# Patient Record
Sex: Male | Born: 1945 | Race: White | Hispanic: No | Smoking: Former smoker
Health system: Southern US, Community
[De-identification: ages and names within clinical notes are randomized; demographics above are authoritative.]

## PROBLEM LIST (undated history)

## (undated) DIAGNOSIS — K219 Gastro-esophageal reflux disease without esophagitis: Secondary | ICD-10-CM

## (undated) DIAGNOSIS — R7989 Other specified abnormal findings of blood chemistry: Secondary | ICD-10-CM

## (undated) DIAGNOSIS — R945 Abnormal results of liver function studies: Secondary | ICD-10-CM

## (undated) DIAGNOSIS — I255 Ischemic cardiomyopathy: Secondary | ICD-10-CM

## (undated) DIAGNOSIS — E7800 Pure hypercholesterolemia, unspecified: Secondary | ICD-10-CM

## (undated) DIAGNOSIS — M1990 Unspecified osteoarthritis, unspecified site: Secondary | ICD-10-CM

## (undated) DIAGNOSIS — I10 Essential (primary) hypertension: Secondary | ICD-10-CM

## (undated) DIAGNOSIS — I2510 Atherosclerotic heart disease of native coronary artery without angina pectoris: Secondary | ICD-10-CM

## (undated) DIAGNOSIS — I253 Aneurysm of heart: Secondary | ICD-10-CM

## (undated) DIAGNOSIS — I219 Acute myocardial infarction, unspecified: Secondary | ICD-10-CM

## (undated) DIAGNOSIS — F419 Anxiety disorder, unspecified: Secondary | ICD-10-CM

## (undated) HISTORY — PX: CORONARY ANGIOPLASTY WITH STENT PLACEMENT: SHX49

## (undated) HISTORY — DX: Aneurysm of heart: I25.3

---

## 1968-10-25 HISTORY — PX: COSMETIC SURGERY: SHX468

## 2001-02-24 DIAGNOSIS — I219 Acute myocardial infarction, unspecified: Secondary | ICD-10-CM

## 2001-02-24 HISTORY — DX: Acute myocardial infarction, unspecified: I21.9

## 2007-06-02 ENCOUNTER — Inpatient Hospital Stay (HOSPITAL_COMMUNITY): Admission: EM | Admit: 2007-06-02 | Discharge: 2007-06-09 | Payer: Self-pay | Admitting: Emergency Medicine

## 2007-12-18 ENCOUNTER — Inpatient Hospital Stay (HOSPITAL_COMMUNITY): Admission: EM | Admit: 2007-12-18 | Discharge: 2007-12-21 | Payer: Self-pay | Admitting: Infectious Diseases

## 2010-07-09 NOTE — Cardiovascular Report (Signed)
[name], [name]:  [id number]   MEDICAL RECORD NO.:  [id number]          PATIENT TYPE:  INP   LOCATION:  2927                         FACILITY:  [facility]   PHYSICIAN:  Madaline Savage, M.D.DATE OF BIRTH:  [birth year]   DATE OF PROCEDURE:  06/07/2007  DATE OF DISCHARGE:                            CARDIAC CATHETERIZATION   PROCEDURES PERFORMED:  1. Selective coronary angiography of the native coronary circulation.  2. Direct intracoronary artery stenting of the proximal end of the      second obtuse marginal branch to the left circumflex coronary      artery.   COMPLICATIONS:  None.   ENTRY SITE:  Right femoral.   DYE USED:  Omnipaque.   MEDICATIONS:  1. Versed.  2. Fentanyl 50.   PATIENT PROFILE:  Mr. [name] is a 65 year old gentleman who had  undergone percutaneous coronary artery stenting of his proximal LAD and  mid LAD on June 02, 2007 by Dr. Allyson Sabal.  He was also said to have 80%  stenosis in his circumflex obtuse marginal branch #2.  The patient had  left ventricular ejection fraction of 20% to 25% with left ventricular  dyskinesis.  After stenting was performed, the patient had chest pain  over the next hours and days and because of that chest pain it was felt  reasonable to restudy the patient, which was done today.   RESULTS:  The left anterior descending coronary artery contains 2 radio-  opaque stents.  The first being very near the large first septal  perforator branch and the second one being after the diagonal branch #1.  These 2 stents would appear to be overlapped and are widely patent.  Then a 43 degree RAO projection with 36 degrees of cranial angulation,  the ostium of diagonal branch #1 which Dr. Allyson Sabal dilated through the  side of the stent strut, looks pristine in appearance with no residual  stenosis, and a healthy-appearing first and second diagonal branch.   The left circumflex coronary artery gives rise to a medium sized  first  obtuse marginal branch and then a huge second obtuse marginal branch,  and then a small portion of the circumflex courses through the  atrioventricular groove.  The proximal portion of this huge second  obtuse marginal branch is 80% stenosed and is felt to be the patient's  culprit vessel.   The right coronary artery shows luminal irregularities throughout its  course but is a large dominant vessel with no significant stenoses.   Left ventricular angiography was not performed because Dr. Allyson Sabal did it  on June 02, 2007, and retrograde left heart catheterization was not  done.   Percutaneous coronary stenting was directly performed using a XB LAD  guide catheter by Cordis with a Prowater wire and was direct stented  with a 2.5 x 18 mm endeavor stent.  The stent balloon inflation was  taken up to 8 atmospheres of pressure with the endeavor balloon and then  I postdilated this newly deployed stent with a 2.75 x 15 mm noncompliant  Voyager balloon, and after a 16 atmospheres inflation for  45 seconds the  noncompliant Voyager balloon was removed from the vessel and leaving the  guidewire in place I took final pictures of the newly deployed stent in  2 views that looked pristine.  The 80% area of stenosis was reduced to  0% residual and TIMI 3 flow was preserved.   The patient tolerated the procedure very well.  There were no  complications.   FINAL IMPRESSION:  1. Successful percutaneous stenting of proximal obtuse marginal branch      #2 with 80% stenosis reduced to 0% residual.  2. Patency of the 2 overlapping left anterior descending stents placed      on 04/08 by Dr. Nanetta Batty.           ______________________________  Madaline Savage, M.D.     WHG/MEDQ  D:  06/07/2007  T:  06/08/2007  Job:  [id number]   cc:   Nanetta Batty, M.D.  [facility]

## 2010-07-09 NOTE — Discharge Summary (Signed)
NAMEKELDRICK, POMPLUN NO.:  [id number]   MEDICAL RECORD NO.:  [id number]          PATIENT TYPE:  INP   LOCATION:  6526                         FACILITY:  [facility]   PHYSICIAN:  Nanetta Batty, M.D.   DATE OF BIRTH:  Jul 11, 1945   DATE OF ADMISSION:  12/18/2007  DATE OF DISCHARGE:  12/21/2007                               DISCHARGE SUMMARY   DISCHARGE DIAGNOSES:  1. Unstable angina, catheterization on this admission revealing patent      circumflex and left anterior descending stents.  2. Preserved left ventricular function with an ejection fraction of 50-      55%.  3. Known coronary disease with previous left anterior descending      stent, placed in [place], [place], in 2009 with ST-elevation      myocardial infarction on June 02, 2007, treated with circumflex      stenting.  4. Treated hypertension.  5. Dyslipidemia with history of intolerance to Zocor with myalgias.  6. Chronic LFT elevation of unclear etiology.   HOSPITAL COURSE:  The patient is a 65 year old male who had a LAD stent  placed in 2003 in [place], [place].  He presented to [place] in May 2009 with  unstable angina and an ST-elevation MI.  He underwent urgent  catheterization in May 2009 with circumflex and LAD intervention.  He  apparently had in-stent restenosis of the LAD.  He did have low ejection  fraction at the time of his presentation but subsequent echocardiogram  as an outpatient showed improved ejection fraction.  He presented again  on December 18, 2007, with chest pain consistent with unstable angina.  His troponins were negative.  He is admitted to telemetry.  His EKG was  suspicious for ST elevation, but his CK-MBs and troponins have been  negative.  The patient underwent diagnostic catheterization on December 20, 2007, by Dr. Allyson Sabal which revealed patent LAD stent, patent  circumflex stent, and an EF of 50-55%.  We feel that the patient will be  discharged on December 21, 2007.   LABORATORY:  Urinalysis is unremarkable.  TSH 1.9.  LDL 118 and HDL 34.  CK-MB and troponins were negative x3.  Liver functions show an SGOT of  38 and SGPT of 59.  Sodium 140, potassium 4.1, BUN 11, and creatinine  1.13.  White count 5.7, hemoglobin 14.1, hematocrit 40.5, and platelets  213.   DISCHARGE MEDICATIONS:  1. Aspirin 325 mg a day.  2. Coreg 6.25 mg daily.  3. Lorazepam 0.5 mg p.r.n. q.8 h.  4. Plavix 75 mg a day.  5. Prilosec 20 mg a day.  6. Ramipril 10 mg a day.  7. Nitroglycerin sublingual p.r.n.  8. Ambien 5 mg nightly p.r.n.  9. Omega-3 fatty acids daily.  10.Crestor 5 mg a day has been added.   DISPOSITION:  The patient is discharged in stable condition and will  follow up with Dr. Allyson Sabal.  At some point, he will need followup LFTs, if  they remain elevated, we should probably refer him to a  gastroenterologist for further workup.  We did call the office  to give  him some Crestor samples.  The patient apparently is out of work and  living currently off his retirement fund.      Abelino Derrick, P.A.      Nanetta Batty, M.D.  Electronically Signed    LKK/MEDQ  D:  12/21/2007  T:  12/22/2007  Job:  [id number]   cc:   Tracey Harries, M.D.

## 2010-07-09 NOTE — Cardiovascular Report (Signed)
[name], [name] NO.:  [id number]   MEDICAL RECORD NO.:  [id number]          PATIENT TYPE:  INP   LOCATION:  2807                         FACILITY:  [facility]   PHYSICIAN:  Nanetta Batty, M.D.   DATE OF BIRTH:  01/18/1946   DATE OF PROCEDURE:  06/02/2007  DATE OF DISCHARGE:                            CARDIAC CATHETERIZATION   [name] is a 65 year old married white male who recently moved from  [place], [place] approximately 4 months ago.  He has a history of remote  MI with LAD stenting in 2003.  He has had effort angina over the last  several days and constant chest pain since around 8 o'clock this  evening.  He presented to [facility] ER where he had ST-segment  elevation, as well as lateral ST segment elevation with inferior  depression.  The patient was brought emergently to the cardiac cath lab  for angiography and emergency intervention.   PROCEDURE DESCRIPTION:  The patient was brought to the second floor  [facility] cardiac cath lab emergently.  His right groin was prepped and  draped in the usual sterile fashion.  One percent Xylocaine was used for  local anesthesia.  A 6-French sheath was inserted into the right femoral  artery using the standard Seldinger technique.  6-French right and left  Judkins diagnostic catheter, as well as a 6-French pigtail catheter were  used for selective coronary angiography, left ventriculography,  subselective left internal mammary artery angiography and distal  abdominal aortography.  Visipaque dye was used throughout the entirety  of the case.  Aortic, left ventricular and __________ pressures were  recorded.   HEMODYNAMICS:  1. Aortic systolic pressure 133, diastolic pressure 71.  2. Left ventricular systolic pressure 126, end-diastolic pressure 24.   SELECTIVE CORONARY ANGIOGRAPHY:  1. Left main normal.  2. LAD; LAD was occluded proximally after the first septal perforator.  3. Left circumflex; had an 80% stenosis  in the AV groove circumflex      after an OM-1.  4. Right coronary artery; a large dominant vessel with minor      irregularities.   LEFT VENTRICULOGRAPHY:  1. RAO left ventriculogram was performed using 25 cc of Visipaque dye      at 12 cc per second.  There was anteroapical and inferoapical      dyskinesia with compensatory hyperkinesia at the base.  The EF was      approximately 25%.  2. Left internal mammary artery; this vessel was subselectively      visualized and was widely patent.  It is suitable for use during      coronary artery bypass grafting if necessary.  3. Distal abdominal aortography; distal abdominal aortogram was      performed using 20 cc of Visipaque dye at 20 cc per second.  The      renal artery was widely patent.  Infrarenal abdominal aorta and      iliac bifurcation were free of significant atherosclerotic changes.   IMPRESSION:  [name] is approximately an hour into an acute anterior  wall myocardial infarction.  We will proceed  with direct intervention.   The patient received 5000 units of heparin in the ER, as well as aspirin  and morphine.  He received an additional 4000 units of heparin at the  beginning of the procedure with an ACT of approximately 400.  He  received 600 mg of p.o. Plavix, Integrilin double bolus and infusion, as  well as Prevacid 20 mg IV.   Using a 6-French JL-4 guide catheter along with an LN-4 __________  190  Asahi soft wire, a 2512 __________  Maverick, the proximal lesion was  crossed and angioplasty __________  pressures resulting in restoration  of antegrade flow.  The fluoroscopically visible stent in the mid LAD  was widely patent.  Following this, a 275 __________  Allena Katz  was then  carefully placed across the proximal LAD lesion and deployed at  approximately 16 atmospheres.  It was postdilated with 3-0 15 __________  Quantum Maverick at 16 atmospheres (3.03 mm) resulting in reduction of  total occlusion to 0%  residual TIMI III flow.  The patient then received  200 mcg of intracoronary nitroglycerin.  There was some encroachment on  the ostium of the moderate-sized first diagonal branch which was then  wired with the Asahi soft and gently dilated ostially with a 2-0  __________ Voyager.   OVERALL IMPRESSION:  Successful direct angioplasty and stenting using a  PROMUS drug-eluting stent, Integrilin with a door to balloon time of an  hour and 4 minutes, reperfusion time of approximately an hour and 20  minutes.  The patient was pain free at the end of the procedure.  He  tolerated the procedure well.  Guidewire and catheter removed.  The  sheath was sewn securely in place.  The patient left the lab in stable  condition to the CCU.  Plans will be to remove the sheath once the ACT  falls below 150.  Integrilin will be continued for 18 hours.  He will be  treated with aspirin, Plavix, beta-blocker, ACE inhibitor and a statin  drug.  At that some point in the near future, he will need staged  circumflex intervention.      Nanetta Batty, M.D.  Electronically Signed     JB/MEDQ  D:  06/02/2007  T:  06/03/2007  Job:  [id number]   cc:   2nd floor [facility] cath lab  [facility]

## 2010-07-09 NOTE — Cardiovascular Report (Signed)
NAMEBAPTISTE, LITTLER NO.:  [id number]   MEDICAL RECORD NO.:  [id number]          PATIENT TYPE:  INP   LOCATION:  6526                         FACILITY:  [facility]   PHYSICIAN:  Nanetta Batty, M.D.   DATE OF BIRTH:  August 25, 1945   DATE OF PROCEDURE:  DATE OF DISCHARGE:                            CARDIAC CATHETERIZATION   [name] is a 65 year old gentleman who suffered an anterior wall  myocardial infarction on June 03, 2007, treated with DES stenting to his  LAD.  He had staged circumflex stenting by Dr. Elsie Lincoln during the same  admission.  His other problems include remote tobacco, hypertension,  hyperlipidemia.  His EF improved to normal by 2-D echo.  He developed  chest pain at 11:00 a.m. on the day of admission on December 18, 2007,  was admitted to [facility] to rule out myocardial infarction.  He was  placed on IV heparin and nitro and presents now for diagnostic coronary  arteriography.   DESCRIPTION OF PROCEDURE:  The patient was brought to the second floor  at [facility]iac [facility] Lab in the postabsorptive state.  He was  premedicated with p.o. Valium and IV fentanyl and Versed.  His right  groin was prepped and shaved in the usual sterile fashion.  1% Xylocaine  was used for local anesthesia.  A 6-French sheath was inserted to the  right femoral artery using standard Seldinger technique.  A 6-French  right-left Judkins diagnostic catheter as well as 6-French pigtail  catheter were used for selective coronary angiography, left  ventriculography respectively.  Visipaque dye was used for the entirety  of the case.  Retrograde aortic, ventricular pullback pressures were  recorded.   HEMODYNAMICS:  1. Aortic systolic pressure 143, diastolic pressure 79.  2. Left ventricular systolic pressure 138, end-diastolic pressure 13.   SELECTIVE CORONARY ANGIOGRAPHY:  1. Left main; left main was normal.  2. LAD; widely patent stent in the proximal third with  patent diagonal      branches arising from the stented segment.  3. Left circumflex; widely patent stent in the mid AV groove      circumflex after OM1.  4. Right coronary; large vessel with lumpy-bumpy disease.  5. Left ventriculography; RAO left ventriculogram was performed using      25 mL of Visipaque dye at 12 mL per second.  The overall LVEF was      estimated between 50 and 55%; however, there was a large area of      distal anteroapical dyskinesia consistent with an LV apical      aneurysm.   IMPRESSION:  Mr. 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  I do not know the cause of his chest pain, but I do not think this  is ischemic.  Continued medical therapy will be recommended.   Sheath was removed and pressure was held in the groin to achieve  hemostasis after the ACT was measured.  The patient left the lab in  stable condition.  He will be  discharged home in the morning on medical  therapy.      Nanetta Batty, M.D.  Electronically Signed     JB/MEDQ  D:  12/20/2007  T:  12/21/2007  Job:  [id number]   cc:   Second Floor [facility]  [facility]  Tracey Harries, M.D.

## 2010-07-09 NOTE — H&P (Signed)
NAME:  [name], [name] NO.:  [id number]   MEDICAL RECORD NO.:  [id number]          PATIENT TYPE:  EMS   LOCATION:  MAJO                         FACILITY:  [facility]   PHYSICIAN:  Nanetta Batty, M.D.   DATE OF BIRTH:  04-08-1945   DATE OF ADMISSION:  12/18/2007  DATE OF DISCHARGE:                              HISTORY & PHYSICAL   CHIEF COMPLAINT:  Chest pain began at 9:30 this morning; was initially  called as an ST elevation myocardial infarction, but on arrival to the  emergency room, EKG had not actually changed significantly from previous  EKGs.   HISTORY OF PRESENT ILLNESS:  A 65 year old white male with history of ST  elevation MI with emergent cath June 02, 2007 with intervention to the  LAD.  He also had residual disease to the OM-2 and underwent PCI and  stent deployment to the OM-2 on June 07, 2007.  Other history includes  hypertension, abnormal LFTs and ischemic cardiomyopathy at that time  with an EF of 20-25%.  The patient did well during the hospitalization  and was discharged and on follow-up at Dr. Hazle Coca office, repeat 2-D  echo revealed EF of 55% and scar consistent with infarction in the mid  inferior apical inferior regions.  His stress test was negative for  ischemia.  Also, on his 2-D echo, he had mild MR as well as mild aortic  regurgitation.   He had felt well, had no problems until today, and he developed chest  discomfort around 9:30.  He was driving, it was in his left arm;  he had  to pull over.  He did left his wife know they were on their way to  [facility].  The second episode is when he pulled over.  He initially had  an episode that he did not treat.  He took one of his own nitroglycerins  and EMS was called.   Prior to his acute MI in April 2009, he had had stent placed to the LAD  in [place], [place].   He was mildly short of breath, mildly diaphoretic with the pain today.  He presented to the ER; by the time he got to the ER,  there was minimal  discomfort.  He was started on IV heparin, IV nitroglycerin, as well as  IV Integralin.  He will be admitted to the CCU unit.  The STEMI was  cancelled due to the EKG without acute changes from his previous  tracings.   PAST MEDICAL HISTORY:  1. Coronary as stated.  2. The patient also has hyperlipidemia, has been unable to tolerate      Zocor.  He could hardly walk as his leg pain was so significant      with the Zocor.  3. He has hypertension.  4. In the past, he is had abnormal LFTs.   SOCIAL HISTORY:  He has not had a job.  He has been using his retirement  fund to survive. He is divorced with 3 children.  He quit smoking in  1979.  He cannot afford cardiac rehab.  The patient is  active, walks and  exercises on his own.   FAMILY HISTORY:  Not significant to this admission.   REVIEW OF SYSTEMS:  GENERAL: No recent colds, fevers or congestion.  HEENT: No blurred vision.  CARDIOVASCULAR: Really no chest pain until  today.  GI: No diarrhea, constipation or melena.  GU: No hematuria,  dysuria.  MUSCULOSKELETAL: No further leg pain since he is off the  statin. NEURO:  No dizziness.  No syncope.  No diabetes.  No thyroid  disease.   EXAMINATION:  VITAL SIGNS:  Currently pending.  GENERAL:  On exam, alert, oriented white male in no acute distress.  SKIN:  Warm and dry.  Brisk capillary refill.  SCLERA:  Clear.  NECK: Supple.  No JVD, no bruits.  No thyromegaly.  LUNGS: Clear without rales, rhonchi or wheezes.  HEART:  Sounds S1 and S2, regular rate and rhythm without murmur,  gallop, rub or click.  ABDOMEN: Soft, nontender, positive bowel sounds.  Cannot palpate liver,  spleen or masses.  LOWER EXTREMITIES: Without edema, 2+ pedals bilaterally and posterior  tibialis.   ASSESSMENT:  1. Unstable angina:  Rule out myocardial infarction, but not ST      elevation myocardial infarction at this point.  EKG is unchanged      from EKG of April 2009.  2. Coronary  disease with stents to the LAD emergently in April 2009,      previous LAD stent in [place].  Also, he also received a stent to      the circumflex on June 07, 2007.  3. Ejection fraction normal at 55% on echocardiogram April 2009 with      medications.  4. Hypertension.  5. Dyslipidemia has been intolerant to Zocor.  6. History of abnormal liver function tests in the past.   PLAN:  Dr. Allyson Sabal saw the patient and assessed him emergently. We will  not take to the catheterization lab.  Currently, we will put him on IV  heparin, IV Integralin and IV nitroglycerin.  Check liver lipids.  I  will go ahead and add Crestor 5 mg daily to his medical regimen.  We  will admit him to the ICU and plan for cardiac catheterization on  Monday, unless he has recurrent problems prior to that time.      Darcella Gasman. Annie Paras, N.P.      Nanetta Batty, M.D.  Electronically Signed    LRI/MEDQ  D:  12/18/2007  T:  12/18/2007  Job:  [id number]   cc:   Nanetta Batty, M.D.  Tracey Harries, M.D.

## 2010-07-12 NOTE — Discharge Summary (Signed)
[name], [name] NO.:  [id number]   MEDICAL RECORD NO.:  [id number]          PATIENT TYPE:  INP   LOCATION:  2927                         FACILITY:  [facility]   PHYSICIAN:  Nanetta Batty, M.D.   DATE OF BIRTH:  02-05-1946   DATE OF ADMISSION:  06/02/2007  DATE OF DISCHARGE:  06/09/2007                               DISCHARGE SUMMARY   DISCHARGE DIAGNOSIS:  1. ST elevation MI with emergent cardiac catheterization and PCI to      the LAD.  2. Coronary disease with residual disease to the OM-2 with PCI stent      deployment to the OM-2 on June 07, 2007.  3. Hypertension.  4. Abnormal LFTs.  5. Ischemic cardiomyopathy EF 20%-25%.   DISCHARGE CONDITION:  Improved.   PROCEDURES:  June 02, 2007, emergent cardiac catheterization by Dr.  Nanetta Batty.   June 02, 2007, PTCA and stent deployment to the LAD due to 100% stenosis  without proneness drug-eluting stent.  Previous stent to the mid LAD was  patent.   June 07, 2007 PTCA and stent deployment with Endeavor stent to the mid  left circumflex lesion by Dr. Elsie Lincoln.   DISCHARGE MEDICATIONS:  1. Aspirin 325 mg daily.  2. Plavix 75 mg one daily, do not stop it could cause a heart attack.  3. Simvastatin 80 mg every evening.  4. Prilosec 20 mg daily.  5. Lipitor 3.125 mg one twice a day.  6. Altace 10 mg daily.  7. Nitroglycerin 1/150 one under the tongue as needed for chest pain      while sitting.  8. Lorazepam 0.5 mg one as needed for anxiety as before.  9. Ambien 5 mg one at bedtime as needed for sleep.   OUTPATIENT MEDS:  Penicillin.   DISCHARGE INSTRUCTIONS:  1. No work until you see Dr. Allyson Sabal.  2. Increase activity slowly.  No smoking for 1 week.  No driving for 1      week.  3. Low-sodium heart-healthy diet.  Wash cath site with soap and water.      Call if any bleeding, swelling or drainage.  4. Go to Dr. Hazle Coca office today pick up samples of Plavix.  5. Follow up with Dr. Allyson Sabal June 17, 2007, at 8:10 a.m.  6. Stop taking metoprolol.   HISTORY OF PRESENT ILLNESS:  This is a 65 year old gentleman presented  to the emergency room June 02, 2007, with complaints of acute onset  chest pain while cutting down a small tray.  Previously in 2003, he had  a stent placed to LAD in [place].   The patient has complained of substernal chest pain, no radiation.  He  was nauseated and diaphoretic and short of breath.  He denied  palpitations or syncope.  If the pain would come and go over the last  few days with any activity, but on the day of admission it had increased  and was consistent or constant.  He had sinus brady with anterior  lateral ST elevations and stenting ST elevation MI was called and the  patient was taken  emergently to the cath lab.   Family history, social history, review of systems see H and P.   PHYSICAL EXAMINATION AT DISCHARGE:  VITAL SIGNS:  Blood pressure 137/83,  pulse 72, respiratory 16, temperature 98.4, and oxygen saturation 100%.  HEART:  Regular rate and rhythm with a 2/6 systolic murmur.  LUNGS:  Clear.  EXTREMITIES: Without edema of right groin with minimal bruising .  Pedal  pulses present.   LABORATORY DATA:  Hemoglobin on admission 13.4, hematocrit 38.9, WBC  9.6, platelets 222, neutrophils 77, lymph 18, monocytes 4, eosinophils  1,  basophils 0 and hemoglobin remained stable throughout  hospitalization.  Protime 14.8 on admission, INR of 1.1, PTT of 31.   Chemistry; sodium 138, potassium 3.8, chloride 104, glucose 119, BUN 20,  creatinine 0.99, hemoglobin essentially was stable at 1.0 was hemolyzed,  but at discharge potassium was stable, at one point it was hemolyzed at  5.7, otherwise normal.  At discharge sodium, 137, potassium 3.7,  chloride 101, CO2 30, glucose 24, BUN 13, creatinine 1.20, total protein  6.3, albumin 3.6, AST on admission was 88, but at discharge it was down  to 22, ALT is normal 47 at discharge 29, alkaline  phosphatase 49, total  bilirubin 0.9, magnesium 2.2.  Glycohemoglobin was normal at 5.8.  Cardiac enzymes ranged 1114 with an MB of 155 and troponin of 28.02 that  was the peak CK-MB and the peak troponin was 28.02.  These came down  over the days, course of the hospitalization and prior to discharge it  was  84 CKs, MB of 2.9, and troponin I 0.44.   Total cholesterol 149, triglycerides 106, HDL 31 and LDL 97.  TSH 1.805.  Urine screen was done and it was positive for benzodiazepines and  opiates but this may be in after pain medications.   Chest x-ray upper normal heart size no acute cardiopulmonary disease on  EKG on admission to the ER ST elevations and lead I aVL and V1 with  reciprocal with depression and V4-V6 lead III and aVF.  Follow-up EKG  after procedure, ST elevations were V3-V6.  Continued ST elevations  June 03, 2007, and on the June 05, 2007, ST elevation had improved in  his lateral leads.   HOSPITAL COURSE:  The patient was admitted with acute ST elevation MI  and was found to have 100% stenosis in his LAD proximal emergent cath  lab up with cardiac cath and underwent PTCA and stent deployment to that  lesion with a drug-eluting, stent.  He did have further coronary disease  and circumflex on the 13th he went back to the cath lab for PTCA and  stent with an endeavor stent to that area.   The patient did well, was ambulating without problems with cardiac rehab  prior to discharge.  He did have a low EF.  We will monitor that with  medications and he will need a 2-D echo in 3 months to evaluate LV  function and that point after treatment.  He was seen and discharged by  Dr. Clarene Duke and will follow up as an outpatient.  He was kept an extra  day because he could not afford the Plavix, so we had to wait till our  office was opened to provide samples.      Darcella Gasman. Annie Paras, N.P.      Nanetta Batty, M.D.  Electronically Signed    LRI/MEDQ  D:  07/23/2007  T:   07/24/2007  Job:  [id number] 

## 2010-11-19 LAB — COMPREHENSIVE METABOLIC PANEL
ALT: 34
ALT: 35
ALT: 29
AST: 40 — ABNORMAL HIGH
AST: 88 — ABNORMAL HIGH
Albumin: 3.6
Albumin: 3.7
Alkaline Phosphatase: 48
Alkaline Phosphatase: 49
Alkaline Phosphatase: 56
BUN: 14
BUN: 14
CO2: 21
CO2: 23
CO2: 28
CO2: 30
Calcium: 9.7
Calcium: 9.5
Chloride: 103
Creatinine, Ser: 1.15
GFR calc Af Amer: >60
GFR calc Af Amer: >60
GFR calc non Af Amer: >60
GFR calc non Af Amer: >60
GFR calc non Af Amer: >60
GFR calc non Af Amer: >60
Glucose, Bld: 108 — ABNORMAL HIGH
Glucose, Bld: 145 — ABNORMAL HIGH
Glucose, Bld: 104 — ABNORMAL HIGH
Potassium: 3.8
Potassium: 3.7
Sodium: 137
Sodium: 139
Sodium: 137
Total Bilirubin: 0.7
Total Bilirubin: 0.4

## 2010-11-19 LAB — CBC
HCT: 38.9 — ABNORMAL LOW
HCT: 39.2
HCT: 40.6
Hemoglobin: 13.6
Hemoglobin: 13.9
MCHC: 34.2
MCHC: 34.3
MCHC: 34.9
MCHC: 34.3
MCV: 93.8
MCV: 93.9
MCV: 94.1
MCV: 94.7
MCV: 94
Platelets: 201
Platelets: 205
Platelets: 210
Platelets: 222
RBC: 4.18 — ABNORMAL LOW
RBC: 4.27
RDW: 13.1
RDW: 13.3
RDW: 13.4
RDW: 13.5
WBC: 7.1
WBC: 9.6

## 2010-11-19 LAB — BASIC METABOLIC PANEL
BUN: 12
BUN: 12
BUN: 16
CO2: 28
Calcium: 9
Calcium: 9
Calcium: 9.2
Calcium: 9.3
Chloride: 97
Creatinine, Ser: 1.11
Creatinine, Ser: 1.12
GFR calc Af Amer: >60
GFR calc Af Amer: >60
GFR calc non Af Amer: 59 — ABNORMAL LOW
GFR calc non Af Amer: >60
GFR calc non Af Amer: >60
Glucose, Bld: 104 — ABNORMAL HIGH
Glucose, Bld: 112 — ABNORMAL HIGH
Glucose, Bld: 128 — ABNORMAL HIGH
Potassium: 4.9
Potassium: 5.7 — ABNORMAL HIGH
Sodium: 136
Sodium: 137
Sodium: 137

## 2010-11-19 LAB — CARDIAC PANEL(CRET KIN+CKTOT+MB+TROPI)
Relative Index: 3.3 — ABNORMAL HIGH
Relative Index: 6.2 — ABNORMAL HIGH
Relative Index: 6.3 — ABNORMAL HIGH
Relative Index: INVALID
Relative Index: INVALID
Total CK: 122
Total CK: 79
Troponin I: 10.83
Troponin I: 2.75
Troponin I: 6.96

## 2010-11-19 LAB — DIFFERENTIAL
Basophils Absolute: 0
Basophils Relative: 0
Eosinophils Absolute: 0.2
Monocytes Absolute: 0.4
Neutro Abs: 7.3
Neutrophils Relative %: 61

## 2010-11-19 LAB — TROPONIN I
Troponin I: 13.16
Troponin I: 0.44 — ABNORMAL HIGH

## 2010-11-19 LAB — POCT I-STAT, CHEM 8
BUN: 20
Calcium, Ion: 1.12
Creatinine, Ser: 1.4
Creatinine, Ser: 1.6 — ABNORMAL HIGH
Glucose, Bld: 119 — ABNORMAL HIGH
HCT: 38 — ABNORMAL LOW
HCT: 41
Hemoglobin: 13.9
Sodium: 138
TCO2: 25

## 2010-11-19 LAB — RAPID URINE DRUG SCREEN, HOSP PERFORMED
Amphetamines: NOT DETECTED
Barbiturates: NOT DETECTED
Benzodiazepines: POSITIVE — AB
Opiates: POSITIVE — AB

## 2010-11-19 LAB — LIPID PANEL
Cholesterol: 149
HDL: 31 — ABNORMAL LOW
Total CHOL/HDL Ratio: 4.8
VLDL: 21

## 2010-11-19 LAB — CK TOTAL AND CKMB (NOT AT ARMC)
Relative Index: 12.7 — ABNORMAL HIGH
Relative Index: 4.3 — ABNORMAL HIGH
Relative Index: INVALID

## 2010-11-19 LAB — APTT: aPTT: 31

## 2010-11-19 LAB — PROTIME-INR: Prothrombin Time: 14.8

## 2010-11-19 LAB — POCT CARDIAC MARKERS
CKMB, poc: 3.5
Myoglobin, poc: 107

## 2010-11-19 LAB — HEMOGLOBIN A1C: Mean Plasma Glucose: 129

## 2010-11-19 LAB — TSH: TSH: 1.805

## 2010-11-19 LAB — MAGNESIUM: Magnesium: 2.2

## 2010-11-25 LAB — URINALYSIS, ROUTINE W REFLEX MICROSCOPIC
Bilirubin Urine: NEGATIVE
Glucose, UA: NEGATIVE
Ketones, ur: NEGATIVE
Leukocytes, UA: NEGATIVE
Protein, ur: NEGATIVE

## 2010-11-25 LAB — DIFFERENTIAL
Basophils Absolute: 0
Basophils Relative: 0
Eosinophils Absolute: 0.2
Eosinophils Relative: 3
Lymphocytes Relative: 28
Lymphs Abs: 1.8
Monocytes Absolute: 0.5
Monocytes Relative: 8
Neutro Abs: 3.8
Neutrophils Relative %: 61

## 2010-11-25 LAB — CARDIAC PANEL(CRET KIN+CKTOT+MB+TROPI)
CK, MB: 2.3
CK, MB: 2.7
Total CK: 159
Troponin I: 0.01

## 2010-11-25 LAB — CBC
HCT: 38.1 — ABNORMAL LOW
HCT: 38.3 — ABNORMAL LOW
HCT: 42.9
Hemoglobin: 14.1
Hemoglobin: 14.7
MCHC: 34.1
MCHC: 34.3
MCV: 95.7
MCV: 95.9
MCV: 96.1
Platelets: 202
Platelets: 213
Platelets: 222
Platelets: 239
RBC: 3.96 — ABNORMAL LOW
RBC: 3.99 — ABNORMAL LOW
RBC: 4.49
RDW: 13.1
RDW: 13.2
RDW: 13.5
WBC: 6.3
WBC: 6.3
WBC: 7.3
WBC: 7.3

## 2010-11-25 LAB — BASIC METABOLIC PANEL
BUN: 13
BUN: 14
CO2: 28
CO2: 29
Calcium: 9
Chloride: 102
Creatinine, Ser: 1.22
GFR calc Af Amer: >60
GFR calc Af Amer: >60
GFR calc non Af Amer: 57 — ABNORMAL LOW
GFR calc non Af Amer: >60
Glucose, Bld: 104 — ABNORMAL HIGH
Potassium: 4.1
Potassium: 4.2
Sodium: 140

## 2010-11-25 LAB — COMPREHENSIVE METABOLIC PANEL
ALT: 59 — ABNORMAL HIGH
CO2: 27
Calcium: 9.9
Chloride: 99
Creatinine, Ser: 1.06
GFR calc non Af Amer: >60
Glucose, Bld: 108 — ABNORMAL HIGH
Sodium: 135
Total Bilirubin: 0.8

## 2010-11-25 LAB — CK TOTAL AND CKMB (NOT AT ARMC): Total CK: 211

## 2010-11-25 LAB — LIPID PANEL
HDL: 34 — ABNORMAL LOW
LDL Cholesterol: 118 — ABNORMAL HIGH
Total CHOL/HDL Ratio: 5.9
Triglycerides: 238 — ABNORMAL HIGH
VLDL: 48 — ABNORMAL HIGH

## 2010-11-25 LAB — HEPARIN LEVEL (UNFRACTIONATED)
Heparin Unfractionated: 0.41
Heparin Unfractionated: <0.1 — ABNORMAL LOW

## 2010-11-25 LAB — MAGNESIUM: Magnesium: 2.3

## 2010-11-25 LAB — POCT CARDIAC MARKERS: Myoglobin, poc: 99.5

## 2010-11-25 LAB — APTT: aPTT: 26

## 2010-11-25 LAB — B-NATRIURETIC PEPTIDE (CONVERTED LAB): Pro B Natriuretic peptide (BNP): 31

## 2010-11-25 LAB — PROTIME-INR
INR: 1
Prothrombin Time: 13.8

## 2011-01-07 ENCOUNTER — Other Ambulatory Visit: Payer: Self-pay

## 2011-01-07 ENCOUNTER — Emergency Department (HOSPITAL_COMMUNITY): Payer: Medicare Other

## 2011-01-07 ENCOUNTER — Encounter: Payer: Self-pay | Admitting: Emergency Medicine

## 2011-01-07 ENCOUNTER — Observation Stay (HOSPITAL_COMMUNITY)
Admission: EM | Admit: 2011-01-07 | Discharge: 2011-01-09 | Disposition: A | Discharge status: Home / Self Care | Payer: Medicare Other | Attending: Internal Medicine | Admitting: Internal Medicine

## 2011-01-07 DIAGNOSIS — I2589 Other forms of chronic ischemic heart disease: Secondary | ICD-10-CM | POA: Insufficient documentation

## 2011-01-07 DIAGNOSIS — I2510 Atherosclerotic heart disease of native coronary artery without angina pectoris: Principal | ICD-10-CM | POA: Insufficient documentation

## 2011-01-07 DIAGNOSIS — I255 Ischemic cardiomyopathy: Secondary | ICD-10-CM | POA: Diagnosis present

## 2011-01-07 DIAGNOSIS — I209 Angina pectoris, unspecified: Secondary | ICD-10-CM | POA: Diagnosis present

## 2011-01-07 DIAGNOSIS — I252 Old myocardial infarction: Secondary | ICD-10-CM | POA: Insufficient documentation

## 2011-01-07 DIAGNOSIS — Z9861 Coronary angioplasty status: Secondary | ICD-10-CM | POA: Insufficient documentation

## 2011-01-07 DIAGNOSIS — E785 Hyperlipidemia, unspecified: Secondary | ICD-10-CM | POA: Insufficient documentation

## 2011-01-07 DIAGNOSIS — R55 Syncope and collapse: Secondary | ICD-10-CM | POA: Insufficient documentation

## 2011-01-07 DIAGNOSIS — I10 Essential (primary) hypertension: Secondary | ICD-10-CM | POA: Insufficient documentation

## 2011-01-07 HISTORY — DX: Abnormal results of liver function studies: R94.5

## 2011-01-07 HISTORY — DX: Unspecified osteoarthritis, unspecified site: M19.90

## 2011-01-07 HISTORY — DX: Other specified abnormal findings of blood chemistry: R79.89

## 2011-01-07 HISTORY — DX: Gastro-esophageal reflux disease without esophagitis: K21.9

## 2011-01-07 HISTORY — DX: Pure hypercholesterolemia, unspecified: E78.00

## 2011-01-07 HISTORY — DX: Atherosclerotic heart disease of native coronary artery without angina pectoris: I25.10

## 2011-01-07 HISTORY — DX: Ischemic cardiomyopathy: I25.5

## 2011-01-07 HISTORY — DX: Acute myocardial infarction, unspecified: I21.9

## 2011-01-07 HISTORY — DX: Essential (primary) hypertension: I10

## 2011-01-07 HISTORY — DX: Anxiety disorder, unspecified: F41.9

## 2011-01-07 LAB — URINALYSIS, ROUTINE W REFLEX MICROSCOPIC
Bilirubin Urine: NEGATIVE
Glucose, UA: NEGATIVE mg/dL
Hgb urine dipstick: NEGATIVE
Ketones, ur: NEGATIVE mg/dL
Protein, ur: NEGATIVE mg/dL
Urobilinogen, UA: 0.2 mg/dL (ref 0.0–1.0)

## 2011-01-07 LAB — DIFFERENTIAL
Basophils Relative: 0 % (ref 0–1)
Eosinophils Absolute: 0.2 10*3/uL (ref 0.0–0.7)
Eosinophils Relative: 3 % (ref 0–5)
Monocytes Relative: 5 % (ref 3–12)
Neutrophils Relative %: 72 % (ref 43–77)

## 2011-01-07 LAB — URINE MICROSCOPIC-ADD ON

## 2011-01-07 LAB — CBC
Hemoglobin: 12.8 g/dL — ABNORMAL LOW (ref 13.0–17.0)
MCH: 30.5 pg (ref 26.0–34.0)
MCHC: 32.7 g/dL (ref 30.0–36.0)
MCV: 93.1 fL (ref 78.0–100.0)

## 2011-01-07 LAB — BASIC METABOLIC PANEL
BUN: 18 mg/dL (ref 6–23)
CO2: 26 mEq/L (ref 19–32)
Calcium: 10.2 mg/dL (ref 8.4–10.5)
GFR calc non Af Amer: 59 mL/min — ABNORMAL LOW (ref >90)
Glucose, Bld: 117 mg/dL — ABNORMAL HIGH (ref 70–99)

## 2011-01-07 LAB — CARDIAC PANEL(CRET KIN+CKTOT+MB+TROPI)
Relative Index: 2.1 (ref 0.0–2.5)
Troponin I: <0.3 ng/mL (ref <0.30)

## 2011-01-07 LAB — POCT I-STAT TROPONIN I: Troponin i, poc: 0.01 ng/mL (ref 0.00–0.08)

## 2011-01-07 MED ORDER — VITAMIN C 500 MG PO TABS
500.0000 mg | ORAL_TABLET | Freq: Every day | ORAL | Status: DC
  Administered 2011-01-08 – 2011-01-09 (×2): 500 mg via ORAL
  Filled 2011-01-07 (×3): qty 1

## 2011-01-07 MED ORDER — SODIUM CHLORIDE 0.9 % IJ SOLN: 3.0000 mL | INTRAMUSCULAR | Status: DC | PRN

## 2011-01-07 MED ORDER — LORAZEPAM 0.5 MG PO TABS: 0.5000 mg | ORAL_TABLET | Freq: Three times a day (TID) | ORAL | Status: DC | PRN

## 2011-01-07 MED ORDER — CLOPIDOGREL BISULFATE 75 MG PO TABS
75.0000 mg | ORAL_TABLET | Freq: Every day | ORAL | Status: DC
  Administered 2011-01-08 – 2011-01-09 (×2): 75 mg via ORAL
  Filled 2011-01-07 (×2): qty 1

## 2011-01-07 MED ORDER — ACETAMINOPHEN 650 MG RE SUPP: 650.0000 mg | Freq: Four times a day (QID) | RECTAL | Status: DC | PRN

## 2011-01-07 MED ORDER — SODIUM CHLORIDE 0.9 % IV SOLN
INTRAVENOUS | Status: DC
  Administered 2011-01-07 – 2011-01-08 (×2): via INTRAVENOUS

## 2011-01-07 MED ORDER — MORPHINE SULFATE 2 MG/ML IJ SOLN: 1.0000 mg | INTRAMUSCULAR | Status: DC | PRN

## 2011-01-07 MED ORDER — ZOLPIDEM TARTRATE 5 MG PO TABS: 10.0000 mg | ORAL_TABLET | Freq: Every evening | ORAL | Status: DC | PRN

## 2011-01-07 MED ORDER — PANTOPRAZOLE SODIUM 40 MG PO TBEC
40.0000 mg | DELAYED_RELEASE_TABLET | Freq: Every day | ORAL | Status: DC
  Administered 2011-01-08 – 2011-01-09 (×2): 40 mg via ORAL
  Filled 2011-01-07 (×2): qty 1

## 2011-01-07 MED ORDER — OMEGA-3-ACID ETHYL ESTERS 1 G PO CAPS
1.0000 g | ORAL_CAPSULE | Freq: Every day | ORAL | Status: DC
  Administered 2011-01-08 – 2011-01-09 (×2): 1 g via ORAL
  Filled 2011-01-07 (×3): qty 1

## 2011-01-07 MED ORDER — ASPIRIN EC 325 MG PO TBEC
325.0000 mg | DELAYED_RELEASE_TABLET | Freq: Every day | ORAL | Status: DC
  Administered 2011-01-08 – 2011-01-09 (×2): 325 mg via ORAL
  Filled 2011-01-07 (×2): qty 1

## 2011-01-07 MED ORDER — TRAMADOL HCL 50 MG PO TABS
50.0000 mg | ORAL_TABLET | Freq: Four times a day (QID) | ORAL | Status: DC | PRN
  Filled 2011-01-07: qty 1

## 2011-01-07 MED ORDER — CARVEDILOL 3.125 MG PO TABS
3.1250 mg | ORAL_TABLET | Freq: Two times a day (BID) | ORAL | Status: DC
  Administered 2011-01-08 – 2011-01-09 (×3): 3.125 mg via ORAL
  Filled 2011-01-07 (×5): qty 1

## 2011-01-07 MED ORDER — ENOXAPARIN SODIUM 40 MG/0.4ML ~~LOC~~ SOLN
40.0000 mg | SUBCUTANEOUS | Status: DC
  Filled 2011-01-07 (×4): qty 0.4

## 2011-01-07 MED ORDER — HYDROCOD POLST-CHLORPHEN POLST 10-8 MG/5ML PO LQCR: 5.0000 mL | Freq: Two times a day (BID) | ORAL | Status: DC | PRN

## 2011-01-07 MED ORDER — LISINOPRIL 10 MG PO TABS
10.0000 mg | ORAL_TABLET | Freq: Every day | ORAL | Status: DC
  Administered 2011-01-08 – 2011-01-09 (×2): 10 mg via ORAL
  Filled 2011-01-07 (×3): qty 1

## 2011-01-07 MED ORDER — OMEGA-3 FATTY ACIDS 1000 MG PO CAPS: 2.0000 g | ORAL_CAPSULE | Freq: Every day | ORAL | Status: DC

## 2011-01-07 MED ORDER — SIMVASTATIN 5 MG PO TABS
5.0000 mg | ORAL_TABLET | Freq: Every day | ORAL | Status: DC
  Filled 2011-01-07 (×2): qty 1

## 2011-01-07 NOTE — ED Notes (Signed)
Report given to [facility], Charity fundraiser.  All questions answered.  Respiratory contacted regarding in- patient order.  Informed by Westly Pam, RT that respiratory tech will collect ABG once pt gets to floor.  Pt to floor via stretcher with RN.

## 2011-01-07 NOTE — ED Provider Notes (Signed)
History     CSN: [id number] Arrival date & time: 01/07/2011  4:56 PM     Chief Complaint  Patient presents with  . Chest Pain  . Near Syncope    HPI Pt was seen at 1720.  Per pt and spouse, c/o gradual onset and resolution of one episode of near syncope that began PTA.  Pt states he was walking at home when he felt "a wave" going up his bilat arms to his chest.  Was assoc with near syncope/lightheadedness.  States he did not fall to the floor but laid down on the floor himself.  Pt states he has a hx of same symptoms with previous MI.  Pt has significant cardiac hx with last stent placement in [place] this past summer. EMS gave ASA and ntg sl without change in pt's symptoms.  Denies any further symptoms since arrival to ED.  Denies CP/palpitations, no SOB/cough, no fevers, no back pain, no abd pain, no N/V/D, no tingling/numbness in extremities, no focal motor weakness, no visual changes.     Past Medical History  Diagnosis Date  . Coronary artery disease     Pt with 4 stents over the course of 2003-2012  . Hypercholesterolemia   . Hypertension   . Elevated LFTs   . Myocardial infarction   . Ischemic cardiomyopathy     Past Surgical History  Procedure Date  . Coronary angioplasty with stent placement      History  Substance Use Topics  . Smoking status: Former Smoker -- 0.0 packs/day for 15 years    Types: Cigarettes  . Smokeless tobacco: Not on file  . Alcohol Use: Yes     1 glass of wine per week     Review of Systems ROS: Statement: All systems negative except as marked or noted in the HPI; Constitutional: Negative for fever and chills. ; ; Eyes: Negative for eye pain, redness and discharge. ; ; ENMT: Negative for ear pain, hoarseness, nasal congestion, sinus pressure and sore throat. ; ; Cardiovascular: Negative for chest pain, palpitations, diaphoresis, dyspnea and peripheral edema. ; ; Respiratory: Negative for cough, wheezing and stridor. ; ; Gastrointestinal:  Negative for nausea, vomiting, diarrhea and abdominal pain, blood in stool, hematemesis, jaundice and rectal bleeding.; Genitourinary: Negative for dysuria, flank pain and hematuria. ; ; Musculoskeletal: Negative for back pain and neck pain. Negative for swelling and trauma.; ; Skin: Negative for pruritus, rash, abrasions, blisters, bruising and skin lesion.; ; Neuro: +near syncope/lightheadedness. Negative for headache and neck stiffness. Negative for altered level of consciousness , altered mental status, extremity weakness, paresthesias, involuntary movement, seizure and syncope.     Allergies  Penicillins  Home Medications   Current Outpatient Rx  Name Route Sig Dispense Refill  . ASPIRIN 325 MG PO TBEC Oral Take 325 mg by mouth daily.     Marland Kitchen CARVEDILOL 3.125 MG PO TABS Oral Take 3.125 mg by mouth 2 (two) times daily with a meal.     . CLOPIDOGREL BISULFATE 75 MG PO TABS Oral Take 75 mg by mouth daily.     . OMEGA-3 FATTY ACIDS 1000 MG PO CAPS Oral Take 2 g by mouth daily.      Marland Kitchen LISINOPRIL 10 MG PO TABS Oral Take 10 mg by mouth daily.      Marland Kitchen LORAZEPAM 0.5 MG PO TABS Oral Take 0.5 mg by mouth every 8 (eight) hours as needed. For anxiety    . LOVASTATIN 10 MG PO TABS Oral Take 10  mg by mouth at bedtime.      Marland Kitchen PANTOPRAZOLE SODIUM 40 MG PO TBEC Oral Take 40 mg by mouth daily.      . TRAMADOL HCL 50 MG PO TABS Oral Take 50 mg by mouth every 6 (six) hours as needed. For pain Maximum dose= 8 tablets per day    . VITAMIN C 500 MG PO TABS Oral Take 500 mg by mouth daily.     Marland Kitchen ZOLPIDEM TARTRATE 10 MG PO TABS Oral Take 10 mg by mouth at bedtime as needed. For sleep      BP 93/63  Pulse 58  SpO2 97% BP 129/80  Pulse 79  Temp(Src) 97.9 F (36.6 C) (Oral)  Resp 16  SpO2 98%  Physical Exam 1735: Physical examination:  Nursing notes reviewed; Vital signs and O2 SAT reviewed;  Constitutional: Well developed, Well nourished, Well hydrated, In no acute distress; Head:  Normocephalic,  atraumatic; Eyes: EOMI, PERRL, No scleral icterus; ENMT: Mouth and pharynx normal, Mucous membranes moist; Neck: Supple, Full range of motion, No lymphadenopathy; Cardiovascular: Regular rate and rhythm, No murmur, rub, or gallop; Respiratory: Breath sounds clear & equal bilaterally, No rales, rhonchi, wheezes, or rub, Normal respiratory effort/excursion; Chest: Nontender, Movement normal; Abdomen: Soft, Nontender, Nondistended, Normal bowel sounds; Extremities: Pulses normal, No tenderness, No edema, No calf edema or asymmetry.; Neuro: AA&Ox3, Major CN grossly intact. No facial droop, speech clear. No gross focal motor or sensory deficits in extremities.; Skin: Color normal, Warm, Dry, no rash, no petechiae.    ED Course  Procedures   MDM  MDM Reviewed: nursing note, vitals and previous chart Reviewed previous: ECG Interpretation: ECG, labs and x-ray    Date: 01/07/2011  Rate: 54  Rhythm: normal sinus rhythm  QRS Axis: left  Intervals: normal  ST/T Wave abnormalities: nonspecific ST/T changes  Conduction Disutrbances:left anterior fascicular block  Narrative Interpretation:   Old EKG Reviewed: unchanged; no significant changes from previous EKG dated 12/20/2007.  Results for orders placed during the hospital encounter of 01/07/11  BASIC METABOLIC PANEL      Component Value Range   Sodium 136  135 - 145 (mEq/L)   Potassium 4.4  3.5 - 5.1 (mEq/L)   Chloride 101  96 - 112 (mEq/L)   CO2 26  19 - 32 (mEq/L)   Glucose, Bld 117 (*) 70 - 99 (mg/dL)   BUN 18  6 - 23 (mg/dL)   Creatinine, Ser 8.11  0.50 - 1.35 (mg/dL)   Calcium 91.4  8.4 - 10.5 (mg/dL)   GFR calc non Af Amer 59 (*) >90 (mL/min)   GFR calc Af Amer 69 (*) >90 (mL/min)  CBC      Component Value Range   WBC 6.9  4.0 - 10.5 (K/uL)   RBC 4.20 (*) 4.22 - 5.81 (MIL/uL)   Hemoglobin 12.8 (*) 13.0 - 17.0 (g/dL)   HCT 78.2  95.6 - 21.3 (%)   MCV 93.1  78.0 - 100.0 (fL)   MCH 30.5  26.0 - 34.0 (pg)   MCHC 32.7  30.0 - 36.0  (g/dL)   RDW 08.6  57.8 - 46.9 (%)   Platelets 201  150 - 400 (K/uL)  DIFFERENTIAL      Component Value Range   Neutrophils Relative 72  43 - 77 (%)   Neutro Abs 5.0  1.7 - 7.7 (K/uL)   Lymphocytes Relative 20  12 - 46 (%)   Lymphs Abs 1.4  0.7 - 4.0 (K/uL)   Monocytes  Relative 5  3 - 12 (%)   Monocytes Absolute 0.3  0.1 - 1.0 (K/uL)   Eosinophils Relative 3  0 - 5 (%)   Eosinophils Absolute 0.2  0.0 - 0.7 (K/uL)   Basophils Relative 0  0 - 1 (%)   Basophils Absolute 0.0  0.0 - 0.1 (K/uL)  URINALYSIS, ROUTINE W REFLEX MICROSCOPIC      Component Value Range   Color, Urine YELLOW  YELLOW    Appearance CLEAR  CLEAR    Specific Gravity, Urine 1.027  1.005 - 1.030    pH 6.0  5.0 - 8.0    Glucose, UA NEGATIVE  NEGATIVE (mg/dL)   Hgb urine dipstick NEGATIVE  NEGATIVE    Bilirubin Urine NEGATIVE  NEGATIVE    Ketones, ur NEGATIVE  NEGATIVE (mg/dL)   Protein, ur NEGATIVE  NEGATIVE (mg/dL)   Urobilinogen, UA 0.2  0.0 - 1.0 (mg/dL)   Nitrite NEGATIVE  NEGATIVE    Leukocytes, UA TRACE (*) NEGATIVE   POCT I-STAT TROPONIN I      Component Value Range   Troponin i, poc 0.01  0.00 - 0.08 (ng/mL)   Comment 3           URINE MICROSCOPIC-ADD ON      Component Value Range   Squamous Epithelial / LPF FEW (*) RARE    WBC, UA 3-6  <3 (WBC/hpf)   RBC / HPF 0-2  <3 (RBC/hpf)   Bacteria, UA RARE  RARE    Dg Chest 2 View  01/07/2011  *RADIOLOGY REPORT*  Clinical Data: Chest pain into both arms and back.  Cough for 3 days.  Prior coronary stents.  Hypertension.  Ex-smoker.  CHEST - 2 VIEW  Comparison: 12/18/2007  Findings: Mild mid thoracic spondylosis. Midline trachea.  Normal heart size and mediastinal contours.  Minimal S-shaped thoracolumbar spine curvature. No pleural effusion or pneumothorax. No congestive failure.  Clear lungs.  Button artifact over the left upper lobe on the frontal.  IMPRESSION: No acute cardiopulmonary disease.  Original Report Authenticated By: Consuello Bossier, M.D.   Ct  Head Wo Contrast  01/07/2011  *RADIOLOGY REPORT*  Clinical Data: 65 year old male with chest pain, tremors, near- syncope.  CT HEAD WITHOUT CONTRAST  Technique:  Contiguous axial images were obtained from the base of the skull through the vertex without contrast.  Comparison: None.  Findings: Scattered paranasal sinus mucosal thickening mostly in the ethmoid and sphenoid sinuses.  Mastoids are clear. No acute osseous abnormality identified.  Visualized orbits and scalp soft tissues are within normal limits.  Scattered cerebral white matter hypodensity.  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 No evidence of cortically based acute infarction identified.  No midline shift, mass effect, or evidence of mass lesion.  No ventriculomegaly. No acute intracranial hemorrhage identified.  No suspicious intracranial vascular hyperdensity.  IMPRESSION: 1.  Nonspecific cerebral white matter disease, favor small vessel ischemia. 2.  Mild to moderate ethmoid and sphenoid sinus inflammatory changes.  Original Report Authenticated By: Harley Hallmark, M.D.   7:33 PM:   Continues to deny CP while in ED.  Not orthostatic.  Concern re: pt's extensive cardiac hx.  Dx testing d/w pt and family.  Questions answered.  Verb understanding, agreeable to admit.  7:43 PM:  T/C to [facility] Dr. Gerri Lins, case discussed, including:  HPI, pertinent PM/SHx, VS/PE, dx testing, ED course and treatment.  Agreeable to admit.  She will eval in ED for admit.  Requests to order  lab set of cardiac markers.         Tata Timmins Allison Quarry, DO 01/08/11 0010

## 2011-01-07 NOTE — ED Notes (Signed)
Pt emergency contact number [phone] Bonita Quin.

## 2011-01-07 NOTE — ED Notes (Signed)
Pt aware of need of urine specimen; urinal at bedside 

## 2011-01-07 NOTE — ED Notes (Signed)
Pt states while in EMS truck, "whoosy" feeling resulted from IV placement.  Pt with Hx of 4 stents placed over the years of 2003-2012.

## 2011-01-07 NOTE — ED Notes (Signed)
Per EMS, Pt with c/o chest pain and near syncope

## 2011-01-07 NOTE — ED Notes (Signed)
EKG performed at 16:33 and was given to Juleen China, M.D

## 2011-01-07 NOTE — H&P (Signed)
[name] is an 65 y.o. male.   Chief Complaint: Atypical chest pain HPI: Pt is a 65 yr old male who presents with a complaint of feeling a tremulous feeling in his arms accompanied by an increased pressure-like sensation in his chest. When I was called by the ED physician she stated that this was accompanied by a near syncopal experience by the patient. Neither the patient nor his wife relate this part of the story to me.  The patient states that the sensation was 7/10 in severity, was accompanied by shortness of breath, and lasted only about 4-5 minutes.  He states that the sensation started in his wrists and travelled up his arms to his sternum and then up to his neck and back.  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  Past Medical History  Diagnosis Date  . Coronary artery disease     Pt with 4 stents over the course of 2003-2012  . Hypercholesterolemia   . Hypertension   . Elevated LFTs   . Myocardial infarction   . Ischemic cardiomyopathy   . Angina   . Arthritis   . GERD (gastroesophageal reflux disease)   . CHF (congestive heart failure)     Past Surgical History  Procedure Date  . Coronary angioplasty with stent placement     Family History  Problem Relation Age of Onset  . Coronary artery disease Mother   . Coronary artery disease Father    Social History:  reports that he has quit smoking. His smoking use included Cigarettes. He has a .75 pack-year smoking history. He does not have any smokeless tobacco history on file. He reports that he drinks alcohol. He reports that he does not use illicit drugs. Medications Prior to Admission  Medication Dose Route Frequency Provider Last Rate Last Dose  . 0.9 %  sodium chloride infusion   Intravenous Continuous Laray Anger, DO      . acetaminophen (TYLENOL) suppository 650 mg  650 mg Rectal Q6H PRN Philis Doke      . aspirin EC tablet 325 mg  325 mg Oral Daily Felicie Kocher      .  carvedilol (COREG) tablet 3.125 mg  3.125 mg Oral BID WC Devyn Griffing      . chlorpheniramine-HYDROcodone (TUSSIONEX) 10-8 MG/5ML suspension 5 mL  5 mL Oral Q12H PRN Joannah Gitlin      . clopidogrel (PLAVIX) tablet 75 mg  75 mg Oral Daily Raywood Wailes      . enoxaparin (LOVENOX) injection 40 mg  40 mg Subcutaneous Q24H Yarden Hillis      . fish oil-omega-3 fatty acids capsule 2 g  2 g Oral Daily Mersedes Alber      . lisinopril (PRINIVIL,ZESTRIL) tablet 10 mg  10 mg Oral Daily Delaine Canter      . LORazepam (ATIVAN) tablet 0.5 mg  0.5 mg Oral Q8H PRN Clearance Chenault      . morphine 2 MG/ML injection 1 mg  1 mg Intravenous Q2H PRN Jamarian Jacinto      . pantoprazole (PROTONIX) EC tablet 40 mg  40 mg Oral Daily Belicia Difatta      . simvastatin (ZOCOR) tablet 5 mg  5 mg Oral q1800 Deny Chevez      . sodium chloride 0.9 % injection 3 mL  3 mL Intravenous PRN Hannahgrace Lalli      . traMADol (ULTRAM) tablet 50 mg  50 mg Oral Q6H PRN Tiyana Galla      .  vitamin C (ASCORBIC ACID) tablet 500 mg  500 mg Oral Daily Zayli Villafuerte      . zolpidem (AMBIEN) tablet 10 mg  10 mg Oral QHS PRN Chanse Kagel       No current outpatient prescriptions on file as of 01/07/2011.    Allergies:  Allergies  Allergen Reactions  . Penicillins Anaphylaxis  . Statins     Pt tolerates lovastatin as a home medication without difficulties.    Constitutional: positive for malaise, negative for chills, fevers, night sweats and weight loss Eyes: negative for icterus, irritation, redness and visual disturbance Ears, nose, mouth, throat, and face: negative for earaches, hoarseness, nasal congestion and sore throat Respiratory: positive for cough and pleurisy/chest pain, negative for hemoptysis, sputum and wheezing Cardiovascular: positive for chest pain, chest pressure/discomfort, dyspnea, fatigue and near-syncope, negative for claudication, lower extremity edema, palpitations and syncope Gastrointestinal: negative for diarrhea, nausea, vomiting and positive for  chronic constipation and hemorrhoids. Genitourinary:negative for dysuria, frequency, hematuria and hesitancy Integument/breast: negative for dryness, pruritus, rash and skin color change Hematologic/lymphatic: negative for bleeding, lymphadenopathy and petechiae Musculoskeletal:positive for stiff joints and negative for muscle weakness, or arthralgias. Neurological: negative for dizziness, gait problems, seizures and weakness Behavioral/Psych: negative for depression, fatigue, irritability and tobacco use   General appearance: alert, cooperative, appears stated age and no distress Head: Normocephalic, without obvious abnormality, atraumatic Eyes: conjunctivae/corneas clear. PERRL, EOM's intact. Fundi benign. Throat: lips, mucosa, and tongue normal; teeth and gums normal Neck: no adenopathy, no carotid bruit, no JVD, supple, symmetrical, trachea midline and thyroid not enlarged, symmetric, no tenderness/mass/nodules Resp: clear to auscultation bilaterally and normal percussion bilaterally Chest wall: no tenderness, left sided chest wall tenderness Cardio: regular rate and rhythm, S1, S2 normal, no murmur, click, rub or gallop and normal apical impulse GI: soft, non-tender; bowel sounds normal; no masses,  no organomegaly Extremities: extremities normal, atraumatic, no cyanosis or edema and Homans sign is negative, no sign of DVT Pulses: 2+ and symmetric Skin: Skin color, texture, turgor normal. No rashes or lesions Lymph nodes: Cervical, supraclavicular, and axillary nodes normal. Neurologic: Alert and oriented X 3, normal strength and tone. Normal symmetric reflexes. Normal coordination and gait  Results for orders placed during the hospital encounter of 01/07/11 (from the past 48 hour(s))  BASIC METABOLIC PANEL     Status: Abnormal   Collection Time   01/07/11  5:48 PM      Component Value Range Comment   Sodium 136  135 - 145 (mEq/L)    Potassium 4.4  3.5 - 5.1 (mEq/L)    Chloride  101  96 - 112 (mEq/L)    CO2 26  19 - 32 (mEq/L)    Glucose, Bld 117 (*) 70 - 99 (mg/dL)    BUN 18  6 - 23 (mg/dL)    Creatinine, Ser 1.61  0.50 - 1.35 (mg/dL)    Calcium 09.6  8.4 - 10.5 (mg/dL)    GFR calc non Af Amer 59 (*) >90 (mL/min)    GFR calc Af Amer 69 (*) >90 (mL/min)   CBC     Status: Abnormal   Collection Time   01/07/11  5:48 PM      Component Value Range Comment   WBC 6.9  4.0 - 10.5 (K/uL)    RBC 4.20 (*) 4.22 - 5.81 (MIL/uL)    Hemoglobin 12.8 (*) 13.0 - 17.0 (g/dL)    HCT 04.5  40.9 - 81.1 (%)    MCV 93.1  78.0 -  100.0 (fL)    MCH 30.5  26.0 - 34.0 (pg)    MCHC 32.7  30.0 - 36.0 (g/dL)    RDW 16.1  09.6 - 04.5 (%)    Platelets 201  150 - 400 (K/uL)   DIFFERENTIAL     Status: Normal   Collection Time   01/07/11  5:48 PM      Component Value Range Comment   Neutrophils Relative 72  43 - 77 (%)    Neutro Abs 5.0  1.7 - 7.7 (K/uL)    Lymphocytes Relative 20  12 - 46 (%)    Lymphs Abs 1.4  0.7 - 4.0 (K/uL)    Monocytes Relative 5  3 - 12 (%)    Monocytes Absolute 0.3  0.1 - 1.0 (K/uL)    Eosinophils Relative 3  0 - 5 (%)    Eosinophils Absolute 0.2  0.0 - 0.7 (K/uL)    Basophils Relative 0  0 - 1 (%)    Basophils Absolute 0.0  0.0 - 0.1 (K/uL)   POCT I-STAT TROPONIN I     Status: Normal   Collection Time   01/07/11  6:11 PM      Component Value Range Comment   Troponin i, poc 0.01  0.00 - 0.08 (ng/mL)    Comment 3            URINALYSIS, ROUTINE W REFLEX MICROSCOPIC     Status: Abnormal   Collection Time   01/07/11  6:46 PM      Component Value Range Comment   Color, Urine YELLOW  YELLOW     Appearance CLEAR  CLEAR     Specific Gravity, Urine 1.027  1.005 - 1.030     pH 6.0  5.0 - 8.0     Glucose, UA NEGATIVE  NEGATIVE (mg/dL)    Hgb urine dipstick NEGATIVE  NEGATIVE     Bilirubin Urine NEGATIVE  NEGATIVE     Ketones, ur NEGATIVE  NEGATIVE (mg/dL)    Protein, ur NEGATIVE  NEGATIVE (mg/dL)    Urobilinogen, UA 0.2  0.0 - 1.0 (mg/dL)    Nitrite NEGATIVE   NEGATIVE     Leukocytes, UA TRACE (*) NEGATIVE    URINE MICROSCOPIC-ADD ON     Status: Abnormal   Collection Time   01/07/11  6:46 PM      Component Value Range Comment   Squamous Epithelial / LPF FEW (*) RARE     WBC, UA 3-6  <3 (WBC/hpf)    RBC / HPF 0-2  <3 (RBC/hpf)    Bacteria, UA RARE  RARE     @[id number] @  Blood pressure 129/80, pulse 79, temperature 97.9 F (36.6 C), temperature source Oral, resp. rate 16, SpO2 98.00%.    Assessment/Plan 1. Chest Pain - Atypical.  Atypical in general, but for this patient very typical of pain that has accompanied his three previous MI's.  Of note however, lately (last two week) the patient has had a dry cough and his chest wall is tender. DDX: 1. Musculoskeletal  2. ACS 3. Anxiety  2. CAD  S/P MI x 3  - Continue home meds and consult cardiology  3. Hypertension-  continue home meds  4. Hyperlipidemia - continue home meds.  Pt states that he can't take cholesterol medications, but he takes lovastatin everyday.  5. Ischemic Cardiomyopathy.  Alyene Predmore 01/07/2011, 8:33 PM

## 2011-01-08 ENCOUNTER — Inpatient Hospital Stay (HOSPITAL_COMMUNITY): Payer: Medicare Other

## 2011-01-08 ENCOUNTER — Encounter (HOSPITAL_COMMUNITY): Payer: Self-pay | Admitting: *Deleted

## 2011-01-08 LAB — CARDIAC PANEL(CRET KIN+CKTOT+MB+TROPI)
Relative Index: 2.4 (ref 0.0–2.5)
Total CK: 179 U/L (ref 7–232)
Troponin I: <0.3 ng/mL (ref <0.30)

## 2011-01-08 LAB — URINE CULTURE
Colony Count: NO GROWTH
Culture: NO GROWTH

## 2011-01-08 LAB — CBC
MCH: 32.4 pg (ref 26.0–34.0)
MCV: 93.7 fL (ref 78.0–100.0)
Platelets: 189 10*3/uL (ref 150–400)
RDW: 12.7 % (ref 11.5–15.5)

## 2011-01-08 LAB — COMPREHENSIVE METABOLIC PANEL
AST: 18 U/L (ref 0–37)
Albumin: 3.6 g/dL (ref 3.5–5.2)
Alkaline Phosphatase: 39 U/L (ref 39–117)
BUN: 15 mg/dL (ref 6–23)
CO2: 27 mEq/L (ref 19–32)
Chloride: 101 mEq/L (ref 96–112)
Creatinine, Ser: 1.21 mg/dL (ref 0.50–1.35)
GFR calc non Af Amer: 61 mL/min — ABNORMAL LOW (ref >90)
Potassium: 3.8 mEq/L (ref 3.5–5.1)
Total Bilirubin: 0.3 mg/dL (ref 0.3–1.2)

## 2011-01-08 LAB — LIPID PANEL
LDL Cholesterol: 77 mg/dL (ref 0–99)
Total CHOL/HDL Ratio: 5.6 RATIO
VLDL: 60 mg/dL — ABNORMAL HIGH (ref 0–40)

## 2011-01-08 MED ORDER — SENNOSIDES 8.8 MG/5ML PO SYRP
5.0000 mL | ORAL_SOLUTION | Freq: Two times a day (BID) | ORAL | Status: DC
  Filled 2011-01-08 (×3): qty 5

## 2011-01-08 MED ORDER — NITROGLYCERIN 0.4 MG SL SUBL: 0.4000 mg | SUBLINGUAL_TABLET | SUBLINGUAL | Status: DC | PRN

## 2011-01-08 MED ORDER — POLYETHYLENE GLYCOL 3350 17 G PO PACK
17.0000 g | PACK | Freq: Every day | ORAL | Status: DC
  Administered 2011-01-08: 17 g via ORAL
  Filled 2011-01-08 (×3): qty 1

## 2011-01-08 MED ORDER — CIPROFLOXACIN HCL 250 MG PO TABS
250.0000 mg | ORAL_TABLET | Freq: Two times a day (BID) | ORAL | Status: DC
  Administered 2011-01-08 – 2011-01-09 (×2): 250 mg via ORAL
  Filled 2011-01-08 (×5): qty 1

## 2011-01-08 NOTE — ED Notes (Signed)
Remains in SB on Monitor.  Denies CP at t his time

## 2011-01-08 NOTE — Progress Notes (Signed)
Subjective: No events overnight. Pt denies chest pain.  Objective:  Vital signs in last 24 hours:  Filed Vitals:   01/07/11 2330 01/08/11 0115 01/08/11 0338 01/08/11 1427  BP: 120/76 107/55 121/73 114/74  Pulse: 52 50 61 56  Temp:   97.5 F (36.4 C) 98.6 F (37 C)  TempSrc:    Oral  Resp: 13 17 18 16   Height:   5\' 7"  (1.702 m)   Weight:   83.099 kg (183 lb 3.2 oz)   SpO2: 99% 97% 97% 95%    Intake/Output from previous day:   Intake/Output Summary (Last 24 hours) at 01/08/11 1936 Last data filed at 01/08/11 1834  Gross per 24 hour  Intake 2192.5 ml  Output    400 ml  Net 1792.5 ml    Physical Exam: General: Alert, awake, oriented x3, in no acute distress. HEENT: No bruits, no goiter. Heart: Regular rate and rhythm, without murmurs, rubs, gallops. Lungs: Clear to auscultation bilaterally. Abdomen: Soft, nontender, nondistended, positive bowel sounds. Extremities: No clubbing cyanosis or edema with positive pedal pulses. Neuro: Grossly intact, nonfocal.   Lab Results:  Basic Metabolic Panel:    Component Value Date/Time   NA 137 01/08/2011 0244   K 3.8 01/08/2011 0244   CL 101 01/08/2011 0244   CO2 27 01/08/2011 0244   BUN 15 01/08/2011 0244   CREATININE 1.21 01/08/2011 0244   GLUCOSE 101* 01/08/2011 0244   CALCIUM 9.4 01/08/2011 0244   CBC:    Component Value Date/Time   WBC 5.5 01/08/2011 0244   HGB 12.9* 01/08/2011 0244   HCT 37.3* 01/08/2011 0244   PLT 189 01/08/2011 0244   MCV 93.7 01/08/2011 0244   NEUTROABS 5.0 01/07/2011 1748   LYMPHSABS 1.4 01/07/2011 1748   MONOABS 0.3 01/07/2011 1748   EOSABS 0.2 01/07/2011 1748   BASOSABS 0.0 01/07/2011 1748    No results found for this or any previous visit (from the past 240 hour(s)).  Studies/Results: Dg Chest 2 View  01/07/2011  *RADIOLOGY REPORT*  Clinical Data: Chest pain into both arms and back.  Cough for 3 days.  Prior coronary stents.  Hypertension.  Ex-smoker.  CHEST - 2 VIEW  Comparison:  12/18/2007  Findings: Mild mid thoracic spondylosis. Midline trachea.  Normal heart size and mediastinal contours.  Minimal S-shaped thoracolumbar spine curvature. No pleural effusion or pneumothorax. No congestive failure.  Clear lungs.  Button artifact over the left upper lobe on the frontal.  IMPRESSION: No acute cardiopulmonary disease.  Original Report Authenticated By: Consuello Bossier, M.D.   Ct Head Wo Contrast  01/07/2011  *RADIOLOGY REPORT*  Clinical Data: 65 year old male with chest pain, tremors, near- syncope.  CT HEAD WITHOUT CONTRAST  Technique:  Contiguous axial images were obtained from the base of the skull through the vertex without contrast.  Comparison: None.  Findings: Scattered paranasal sinus mucosal thickening mostly in the ethmoid and sphenoid sinuses.  Mastoids are clear. No acute osseous abnormality identified.  Visualized orbits and scalp soft tissues are within normal limits.  Scattered cerebral white matter hypodensity.  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 No evidence of cortically based acute infarction identified.  No midline shift, mass effect, or evidence of mass lesion.  No ventriculomegaly. No acute intracranial hemorrhage identified.  No suspicious intracranial vascular hyperdensity.  IMPRESSION: 1.  Nonspecific cerebral white matter disease, favor small vessel ischemia. 2.  Mild to moderate ethmoid and sphenoid sinus inflammatory changes.  Original Report Authenticated By: H.LEE  Dorina Hoyer, M.D.    Medications: Scheduled Meds:   . aspirin  325 mg Oral Daily  . carvedilol  3.125 mg Oral BID WC  . ciprofloxacin  250 mg Oral BID  . clopidogrel  75 mg Oral Daily  . enoxaparin  40 mg Subcutaneous Q24H  . lisinopril  10 mg Oral Daily  . omega-3 acid ethyl esters  1 g Oral Daily  . pantoprazole  40 mg Oral Daily  . polyethylene glycol  17 g Oral Daily  . sennosides  5 mL Oral BID  . simvastatin  5 mg Oral q1800  . vitamin C  500 mg  Oral Daily  . DISCONTD: fish oil-omega-3 fatty acids  2 g Oral Daily   Continuous Infusions:   . sodium chloride 75 mL/hr at 01/08/11 1757   PRN Meds:.acetaminophen, chlorpheniramine-HYDROcodone, LORazepam, morphine, nitroGLYCERIN, sodium chloride, traMADol, zolpidem  Assessment/Plan:  Principal Problem:  *Angina pectoris - chest pain now resolved, cont ASA and Plavix. Obtain FLP, A1C and continue cholesterol and BP control. Morphine and NTG prn pain. So far CE are negative and no events on tele. If pain recurs will obtain cardio consult.   Active Problems:  CAD S/P percutaneous coronary angioplasty  Hyperlipidemia LDL goal < 100 - obtain FLP and continue statin  Hypertension - well controlled    LOS: 1 day   MAGICK-Sukhman Kocher 01/08/2011, 7:36 PM

## 2011-01-08 NOTE — Progress Notes (Signed)
UTILIZATION REVIEW COMPLETE Jermond Burkemper Jones 01/08/2011 [phone] OR [phone]  

## 2011-01-09 LAB — BASIC METABOLIC PANEL
Calcium: 9.4 mg/dL (ref 8.4–10.5)
GFR calc Af Amer: 82 mL/min — ABNORMAL LOW (ref >90)
GFR calc non Af Amer: 71 mL/min — ABNORMAL LOW (ref >90)
Glucose, Bld: 99 mg/dL (ref 70–99)
Sodium: 139 mEq/L (ref 135–145)

## 2011-01-09 LAB — URINE CULTURE
Colony Count: 2000
Culture  Setup Time: 201211140823

## 2011-01-09 LAB — HEMOGLOBIN A1C
Hgb A1c MFr Bld: 6 % — ABNORMAL HIGH (ref <5.7)
Mean Plasma Glucose: 126 mg/dL — ABNORMAL HIGH (ref <117)

## 2011-01-09 LAB — CBC
Hemoglobin: 13.1 g/dL (ref 13.0–17.0)
MCH: 31.7 pg (ref 26.0–34.0)
MCHC: 33.6 g/dL (ref 30.0–36.0)
Platelets: 184 10*3/uL (ref 150–400)
RDW: 12.8 % (ref 11.5–15.5)

## 2011-01-09 MED ORDER — ZOLPIDEM TARTRATE 10 MG PO TABS: 10.0000 mg | ORAL_TABLET | Freq: Every evening | ORAL | Status: DC | PRN

## 2011-01-09 MED ORDER — CIPROFLOXACIN HCL 250 MG PO TABS: 250.0000 mg | ORAL_TABLET | Freq: Two times a day (BID) | ORAL | Status: DC

## 2011-01-09 MED ORDER — LORAZEPAM 0.5 MG PO TABS: 0.5000 mg | ORAL_TABLET | Freq: Three times a day (TID) | ORAL | Status: DC | PRN

## 2011-01-09 MED ORDER — NITROGLYCERIN 0.4 MG SL SUBL: 0.4000 mg | SUBLINGUAL_TABLET | SUBLINGUAL | Status: DC | PRN

## 2011-01-09 MED ORDER — HYDROCOD POLST-CHLORPHEN POLST 10-8 MG/5ML PO LQCR: 5.0000 mL | Freq: Two times a day (BID) | ORAL | Status: DC | PRN

## 2011-01-09 MED ORDER — TRAMADOL HCL 50 MG PO TABS: 50.0000 mg | ORAL_TABLET | Freq: Four times a day (QID) | ORAL | Status: DC | PRN

## 2011-01-09 NOTE — Progress Notes (Signed)
PT DC'D TO HOME WITH SELF CARE. [name] 01/09/2011 [phone] OR [phone]

## 2011-01-09 NOTE — Discharge Summary (Signed)
Patient ID: [name] MRN: [id number] DOB/AGE: 65/11/1945 65 y.o.  Admit date: 01/07/2011 Discharge date: 01/09/2011  Primary Care Physician:  No primary provider on file.  Discharge Diagnoses:    Present on Admission:   Principal Problem:  *Angina pectoris Active Problems:  CAD S/P percutaneous coronary angioplasty  Hyperlipidemia LDL goal < 100  Hypertension  Ischemic cardiomyopathy  Previous myocardial infarction older than 8 weeks   Current Discharge Medication List    START taking these medications   Details  chlorpheniramine-HYDROcodone (TUSSIONEX) 10-8 MG/5ML LQCR Take 5 mLs by mouth every 12 (twelve) hours as needed. Qty: 140 mL, Refills: 1    ciprofloxacin (CIPRO) 250 MG tablet Take 1 tablet (250 mg total) by mouth 2 (two) times daily. Qty: 14 tablet, Refills: 0    nitroGLYCERIN (NITROSTAT) 0.4 MG SL tablet Place 1 tablet (0.4 mg total) under the tongue every 5 (five) minutes x 3 doses as needed for chest pain. Qty: 31 tablet, Refills: 3      CONTINUE these medications which have CHANGED   Details  !! LORazepam (ATIVAN) 0.5 MG tablet Take 1 tablet (0.5 mg total) by mouth every 8 (eight) hours as needed for anxiety. Qty: 30 tablet, Refills: 0    !! traMADol (ULTRAM) 50 MG tablet Take 1 tablet (50 mg total) by mouth every 6 (six) hours as needed. Maximum dose= 8 tablets per day Qty: 30 tablet, Refills: 1    !! zolpidem (AMBIEN) 10 MG tablet Take 1 tablet (10 mg total) by mouth at bedtime as needed for sleep. Qty: 30 tablet, Refills: 0     !! - Potential duplicate medications found. Please discuss with provider.    CONTINUE these medications which have NOT CHANGED   Details  aspirin 325 MG EC tablet Take 325 mg by mouth daily.     carvedilol (COREG) 3.125 MG tablet Take 3.125 mg by mouth 2 (two) times daily with a meal.     clopidogrel (PLAVIX) 75 MG tablet Take 75 mg by mouth daily.     Cyanocobalamin (VITAMIN B 12 PO) Take 500 mg by mouth 1 day  or 1 dose.      fish oil-omega-3 fatty acids 1000 MG capsule Take 2 g by mouth daily.      lisinopril (PRINIVIL,ZESTRIL) 10 MG tablet Take 10 mg by mouth daily.      !! LORazepam (ATIVAN) 0.5 MG tablet Take 0.5 mg by mouth every 8 (eight) hours as needed. For anxiety    lovastatin (MEVACOR) 10 MG tablet Take 10 mg by mouth at bedtime.      pantoprazole (PROTONIX) 40 MG tablet Take 40 mg by mouth daily.      !! traMADol (ULTRAM) 50 MG tablet Take 50 mg by mouth every 6 (six) hours as needed. For pain Maximum dose= 8 tablets per day    vitamin C (ASCORBIC ACID) 500 MG tablet Take 500 mg by mouth daily.     !! zolpidem (AMBIEN) 10 MG tablet Take 10 mg by mouth at bedtime as needed. For sleep     !! - Potential duplicate medications found. Please discuss with provider.      Disposition and Follow-up: Pt will have follow up appointment with cardiology in 2 weeks for further evaluation and recommendations on management. Please note that pt denies any chest pain upon discharge and his vitals were stable along with blood work. CE x 3 were negative and there were no acute events on telemetry.  Consults: none  Significant  Diagnostic Studies:  Dg Chest 2 View  01/07/2011   IMPRESSION: No acute cardiopulmonary disease.    Ct Head Wo Contrast  01/07/2011   IMPRESSION: 1.  Nonspecific cerebral white matter disease, favor small vessel ischemia. 2.  Mild to moderate ethmoid and sphenoid sinus inflammatory changes.    Brief H and P: Pt is a 65 yr old male who presents with a complaint of feeling a tremulous feeling in his arms accompanied by an increased pressure-like sensation in his chest. When I was called by the ED physician she stated that this was accompanied by a near syncopal experience by the patient. Neither the patient nor his wife relate this part of the story to me. The patient states that the sensation was 7/10 in severity, was accompanied by shortness of breath, and lasted only  about 4-5 minutes. He states that the sensation started in his wrists and travelled up his arms to his sternum and then up to his neck and back.  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   Physical Exam on Discharge:  Filed Vitals:   01/08/11 0338 01/08/11 1427 01/08/11 2144 01/09/11 0437  BP: 121/73 114/74 132/77 145/85  Pulse: 61 56 51 51  Temp: 97.5 F (36.4 C) 98.6 F (37 C) 98.2 F (36.8 C) 98.3 F (36.8 C)  TempSrc:  Oral    Resp: 18 16 18 18   Height: 5\' 7"  (1.702 m)     Weight: 83.099 kg (183 lb 3.2 oz)   83 kg (182 lb 15.7 oz)  SpO2: 97% 95% 97% 98%     Intake/Output Summary (Last 24 hours) at 01/09/11 0942 Last data filed at 01/09/11 0900  Gross per 24 hour  Intake   2505 ml  Output      0 ml  Net   2505 ml    General: Alert, awake, oriented x3, in no acute distress. HEENT: No bruits, no goiter. Heart: Regular rate and rhythm, without murmurs, rubs, gallops. Lungs: Clear to auscultation bilaterally. Abdomen: Soft, nontender, nondistended, positive bowel sounds. Extremities: No clubbing cyanosis or edema with positive pedal pulses. Neuro: Grossly intact, nonfocal.  CBC:    Component Value Date/Time   WBC 5.4 01/09/2011 0517   HGB 13.1 01/09/2011 0517   HCT 39.0 01/09/2011 0517   PLT 184 01/09/2011 0517   MCV 94.4 01/09/2011 0517   NEUTROABS 5.0 01/07/2011 1748   LYMPHSABS 1.4 01/07/2011 1748   MONOABS 0.3 01/07/2011 1748   EOSABS 0.2 01/07/2011 1748   BASOSABS 0.0 01/07/2011 1748    Basic Metabolic Panel:    Component Value Date/Time   NA 139 01/09/2011 0517   K 4.9 01/09/2011 0517   CL 105 01/09/2011 0517   CO2 23 01/09/2011 0517   BUN 14 01/09/2011 0517   CREATININE 1.07 01/09/2011 0517   GLUCOSE 99 01/09/2011 0517   CALCIUM 9.4 01/09/2011 0517    Hospital Course:  Principal Problem:  *Angina pectoris - chest pain resolved, CE x 3 negative, no events on telemetry, no acute changes on EKG. Pt will need  follow up in outpatient setting for further evaluation given his multiple risk factors. Cardiology on call was called and they have agreed with plan.  Active Problems:  CAD S/P percutaneous coronary angioplasty - last cardiac cath in 2009   Hyperlipidemia LDL goal < 100 - well controlled   Hypertension - well controlled during the hospitalization  Time spent on Discharge: Over 30 minutes  Signed:  MAGICK-MYERS, ISKRA 01/09/2011, 9:42 AM

## 2011-01-09 NOTE — Progress Notes (Signed)
Marylee Floras, RN

## 2011-01-09 NOTE — Progress Notes (Signed)
Cosign for [name] RN assessment, medication administration, care plan/ education, and progress note.

## 2011-01-09 NOTE — Progress Notes (Signed)
Patient stable upon discharge. Patient given discharge instructions and prescriptions by RN. Patient refused a wheelchair and so he ambulated out of hospital with wife and son, and RN walked them out to front. No Equipment ordered for patient

## 2011-01-14 ENCOUNTER — Encounter: Payer: Self-pay | Admitting: *Deleted

## 2011-01-17 ENCOUNTER — Encounter: Payer: Self-pay | Admitting: Cardiovascular Disease

## 2011-01-17 ENCOUNTER — Ambulatory Visit (INDEPENDENT_AMBULATORY_CARE_PROVIDER_SITE_OTHER): Payer: PRIVATE HEALTH INSURANCE | Admitting: Cardiovascular Disease

## 2011-01-17 DIAGNOSIS — E785 Hyperlipidemia, unspecified: Secondary | ICD-10-CM

## 2011-01-17 DIAGNOSIS — I10 Essential (primary) hypertension: Secondary | ICD-10-CM

## 2011-01-17 DIAGNOSIS — I255 Ischemic cardiomyopathy: Secondary | ICD-10-CM

## 2011-01-17 DIAGNOSIS — I2510 Atherosclerotic heart disease of native coronary artery without angina pectoris: Secondary | ICD-10-CM

## 2011-01-17 DIAGNOSIS — I428 Other cardiomyopathies: Secondary | ICD-10-CM

## 2011-01-17 DIAGNOSIS — R079 Chest pain, unspecified: Secondary | ICD-10-CM

## 2011-01-17 DIAGNOSIS — I519 Heart disease, unspecified: Secondary | ICD-10-CM

## 2011-01-17 NOTE — Assessment & Plan Note (Signed)
Cholesterol is at goal.  Continue current dose of statin and diet Rx.  No myalgias or side effects.  F/U  LFT's in 6 months. Lab Results  Component Value Date   LDLCALC 77 01/08/2011

## 2011-01-17 NOTE — Assessment & Plan Note (Signed)
Well controlled.  Continue current medications and low sodium Dash type diet.    

## 2011-01-17 NOTE — Patient Instructions (Addendum)
Your physician recommends that you schedule a follow-up appointment in: 2-3 WEEKS WITH DR Eden Emms AFTER  CARDIAC MRI HX OF  LV CLOT AND  CARDIOMYOPATHY Your physician recommends that you continue on your current medications as directed. Please refer to the Current Medication list given to you today. Your physician recommends that you return for lab work in: TODAY P2Y  BMET  DX CHEST PAIN   CARDIAC MRI WITH CONTRAST

## 2011-01-17 NOTE — Progress Notes (Signed)
65 yo previously followed by Dr Allyson Sabal.  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  Last procedure in [place] 2009. Spends half the year in [facility] and half here.  Recently seen in ER for funny feeling in arms that radiated to head and "presyncope"  Has had episodes like this every 4-5 months for the last 3 years  No documented neuro deficits or arrythmia.  Complaint of feeling a tremulous feeling in his arms accompanied by an increased pressure-like sensation in his chest. When I was called by the ED physician she stated that this was accompanied by a near syncopal experience by the patient. Neither the patient nor his wife relate this part of the story to me. The patient states that the sensation was 7/10 in severity, was accompanied by shortness of breath, and lasted only about 4-5 minutes. He states that the sensation started in his wrists and travelled up his arms to his sternum and then up to his neck and back.  R/O and no arrythmia on monitor and D/C home.  Reviewed records from [place] in 7/12. Had IMI with ruptured plaque.  Stents in circ and LAD patent.  Got another stent to RCA.  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  No exertional chest pain.  No TIA;s.  Compliant with meds.    Long discussion with him about criteria for AICD.  Need to quantitate EF and recheck mural apical thrombus.    Reviewed multiple records from [facility] Dr Allyson Sabal Recent [facility] admission  [facility] Time just reviewing records 30 minutes   ROS: Denies fever, malais, weight loss, blurry vision, decreased visual acuity, cough, sputum, SOB, hemoptysis, pleuritic pain, palpitaitons, heartburn, abdominal pain, melena, lower extremity edema, claudication, or rash.  All other systems reviewed and negative   General: Affect appropriate Healthy:  appears stated age HEENT: normal Neck supple with no adenopathy JVP normal no  bruits no thyromegaly Lungs clear with no wheezing and good diaphragmatic motion Heart:  S1/S2 no murmur,rub, gallop or click PMI normal Abdomen: benighn, BS positve, no tenderness, no AAA no bruit.  No HSM or HJR Distal pulses intact with no bruits No edema Neuro non-focal Skin warm and dry No muscular weakness  Medications Current Outpatient Prescriptions  Medication Sig Dispense Refill  . aspirin 325 MG EC tablet Take 325 mg by mouth daily.       . carvedilol (COREG) 3.125 MG tablet Take 3.125 mg by mouth 2 (two) times daily with a meal.       . clopidogrel (PLAVIX) 75 MG tablet Take 75 mg by mouth daily.       . Coenzyme Q10 (CO Q10) 100 MG TABS Take 1 tablet by mouth daily.        . Cyanocobalamin (VITAMIN B 12 PO) Take 500 mg by mouth 1 day or 1 dose.        . Fish Oil-Krill Oil (PA FISH OIL/KRILL PO) Take 300 mg by mouth daily.        Marland Kitchen lisinopril (PRINIVIL,ZESTRIL) 10 MG tablet Take 10 mg by mouth daily.        Marland Kitchen LORazepam (ATIVAN) 0.5 MG tablet Take 0.5 mg by mouth every 8 (eight) hours as needed. For anxiety      . nitroGLYCERIN (NITROSTAT) 0.4 MG SL tablet Place 1 tablet (0.4 mg total) under the tongue every 5 (five) minutes x 3 doses as  needed for chest pain.  31 tablet  3  . oxyCODONE-acetaminophen (PERCOCET) 5-325 MG per tablet       . pantoprazole (PROTONIX) 40 MG tablet Take 40 mg by mouth daily.        . vitamin C (ASCORBIC ACID) 500 MG tablet Take 500 mg by mouth daily.       Marland Kitchen zolpidem (AMBIEN) 10 MG tablet Take 10 mg by mouth at bedtime as needed. For sleep      . spironolactone (ALDACTONE) 25 MG tablet Take 25 mg by mouth daily.         Allergies Penicillins and Statins  Family History: Family History  Problem Relation Age of Onset  . Coronary artery disease Mother   . Coronary artery disease Father     Social History: History   Social History  . Marital Status: Divorced    Spouse Name: N/A    Number of Children: N/A  . Years of Education: N/A    Occupational History  . Not on file.   Social History Main Topics  . Smoking status: Former Smoker -- 0.0 packs/day for 15 years    Types: Cigarettes  . Smokeless tobacco: Not on file  . Alcohol Use: 0.6 oz/week    1 Glasses of wine per week     1 glass of wine per week  . Drug Use: No  . Sexually Active: Yes   Other Topics Concern  . Not on file   Social History Narrative  . No narrative on file    Electrocardiogram:  Assessment and Plan

## 2011-01-17 NOTE — Assessment & Plan Note (Signed)
Stable no angina.  Check P2Y especially since he is on chronic proton pump inhibitor

## 2011-01-17 NOTE — Assessment & Plan Note (Signed)
F/U cardiac MRI to quantitate EF and see if he needs to be considered for AICD.  Also to see if mural apical thrombus still present  BMET today

## 2011-02-04 ENCOUNTER — Other Ambulatory Visit: Payer: Self-pay | Admitting: Cardiovascular Disease

## 2011-03-10 ENCOUNTER — Telehealth: Payer: Self-pay | Admitting: Cardiovascular Disease

## 2011-03-10 NOTE — Telephone Encounter (Signed)
New Msg: Pt calling wanting to speak with nurse/MD regarding scheduling of heart MRI. Please return pt call to discuss further.

## 2011-03-11 NOTE — Telephone Encounter (Signed)
PT CALLING TO SCHEDULE CARDIAC  MRI  PT IS NOT AVAILABLE  ON 1-16 OR 18 IS AVAILABLE   ANY OTHER DAYS IN THE NEXT COUPLE OF WEEKS  PT AWARE WILL FORWARD MESSAGE TO  SCHEDULER   TEST WAS ORDER AT LAST OFFICE VISIT  SEE NOTE .CY

## 2011-03-21 ENCOUNTER — Other Ambulatory Visit: Payer: Self-pay | Admitting: *Deleted

## 2011-03-21 DIAGNOSIS — I255 Ischemic cardiomyopathy: Secondary | ICD-10-CM

## 2011-03-26 ENCOUNTER — Telehealth: Payer: Self-pay | Admitting: Cardiovascular Disease

## 2011-03-26 DIAGNOSIS — I2589 Other forms of chronic ischemic heart disease: Secondary | ICD-10-CM

## 2011-03-26 NOTE — Telephone Encounter (Signed)
New Problem:     Patient needs orders put in for BUN and Creatin today before 10AM.

## 2011-03-27 ENCOUNTER — Encounter: Payer: Self-pay | Admitting: Cardiovascular Disease

## 2011-03-27 ENCOUNTER — Inpatient Hospital Stay (HOSPITAL_COMMUNITY): Admission: RE | Admit: 2011-03-27 | Payer: PRIVATE HEALTH INSURANCE | Source: Ambulatory Visit

## 2011-04-01 ENCOUNTER — Other Ambulatory Visit (INDEPENDENT_AMBULATORY_CARE_PROVIDER_SITE_OTHER): Payer: Medicare Other | Admitting: *Deleted

## 2011-04-01 DIAGNOSIS — I2589 Other forms of chronic ischemic heart disease: Secondary | ICD-10-CM

## 2011-04-01 LAB — BASIC METABOLIC PANEL
BUN: 20 mg/dL (ref 6–23)
Chloride: 102 mEq/L (ref 96–112)
Glucose, Bld: 118 mg/dL — ABNORMAL HIGH (ref 70–99)
Potassium: 4.5 mEq/L (ref 3.5–5.1)

## 2011-04-02 ENCOUNTER — Ambulatory Visit (HOSPITAL_COMMUNITY)
Admission: RE | Admit: 2011-04-02 | Discharge: 2011-04-02 | Disposition: A | Discharge status: Home / Self Care | Payer: Medicare Other | Source: Ambulatory Visit | Attending: Cardiovascular Disease | Admitting: Cardiovascular Disease

## 2011-04-02 DIAGNOSIS — I519 Heart disease, unspecified: Secondary | ICD-10-CM | POA: Insufficient documentation

## 2011-04-02 DIAGNOSIS — I059 Rheumatic mitral valve disease, unspecified: Secondary | ICD-10-CM | POA: Insufficient documentation

## 2011-04-02 DIAGNOSIS — I255 Ischemic cardiomyopathy: Secondary | ICD-10-CM

## 2011-04-02 DIAGNOSIS — I517 Cardiomegaly: Secondary | ICD-10-CM | POA: Insufficient documentation

## 2011-04-02 DIAGNOSIS — I2589 Other forms of chronic ischemic heart disease: Secondary | ICD-10-CM

## 2011-04-02 MED ORDER — GADOBENATE DIMEGLUMINE 529 MG/ML IV SOLN
30.0000 mL | Freq: Once | INTRAVENOUS | Status: AC
  Administered 2011-04-02: 30 mL via INTRAVENOUS

## 2011-04-07 ENCOUNTER — Telehealth: Payer: Self-pay | Admitting: *Deleted

## 2011-04-07 ENCOUNTER — Telehealth: Payer: Self-pay | Admitting: Cardiovascular Disease

## 2011-04-07 MED ORDER — LISINOPRIL 10 MG PO TABS: 10.0000 mg | ORAL_TABLET | Freq: Every day | ORAL | Status: DC

## 2011-04-07 MED ORDER — CLOPIDOGREL BISULFATE 75 MG PO TABS: 75.0000 mg | ORAL_TABLET | Freq: Every day | ORAL | Status: DC

## 2011-04-07 MED ORDER — PANTOPRAZOLE SODIUM 40 MG PO TBEC: 40.0000 mg | DELAYED_RELEASE_TABLET | Freq: Every day | ORAL | Status: DC

## 2011-04-07 MED ORDER — CARVEDILOL 3.125 MG PO TABS: 3.1250 mg | ORAL_TABLET | Freq: Two times a day (BID) | ORAL | Status: DC

## 2011-04-07 NOTE — Telephone Encounter (Signed)
Follow-up:     Patient called claiming to return your phone call. Please call back.

## 2011-04-07 NOTE — Telephone Encounter (Signed)
DUPLICATE MESSAGE I BELIEVE PT AWARE OF MRI RESULTS AND F/U APPT MADE .Zack Seal

## 2011-04-07 NOTE — Telephone Encounter (Signed)
Message copied by Alois Cliche on Mon Apr 07, 2011 10:48 AM ------      Message from: Wendall Stade      Created: Fri Apr 04, 2011 10:04 AM       MRI shows no clot in heart and EF 48% so no AICD indicated      ----- Message -----         From: Rad Results In Interface         Sent: 04/03/2011   5:37 PM           To: Wendall Stade, MD

## 2011-05-09 ENCOUNTER — Ambulatory Visit (INDEPENDENT_AMBULATORY_CARE_PROVIDER_SITE_OTHER): Payer: Medicare Other | Admitting: Cardiovascular Disease

## 2011-05-09 ENCOUNTER — Encounter: Payer: Self-pay | Admitting: Cardiovascular Disease

## 2011-05-09 VITALS — BP 124/76 | HR 93 | Wt 191.1 lb

## 2011-05-09 DIAGNOSIS — I10 Essential (primary) hypertension: Secondary | ICD-10-CM

## 2011-05-09 DIAGNOSIS — I2589 Other forms of chronic ischemic heart disease: Secondary | ICD-10-CM

## 2011-05-09 DIAGNOSIS — E785 Hyperlipidemia, unspecified: Secondary | ICD-10-CM

## 2011-05-09 DIAGNOSIS — I2510 Atherosclerotic heart disease of native coronary artery without angina pectoris: Secondary | ICD-10-CM

## 2011-05-09 DIAGNOSIS — Z9861 Coronary angioplasty status: Secondary | ICD-10-CM

## 2011-05-09 DIAGNOSIS — I255 Ischemic cardiomyopathy: Secondary | ICD-10-CM

## 2011-05-09 NOTE — Assessment & Plan Note (Signed)
Well controlled.  Continue current medications and low sodium Dash type diet.    

## 2011-05-09 NOTE — Progress Notes (Signed)
66 yo previously followed by Dr Allyson Sabal. 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 Last procedure in [place] 2009. Spends half the year in [facility] and half here. Recently seen in ER for funny feeling in arms that radiated to head and "presyncope" Has had episodes like this every 4-5 months for the last 3 years No documented neuro deficits or arrythmia. Complaint of feeling a tremulous feeling in his arms accompanied by an increased pressure-like sensation in his chest. When I was called by the ED physician she stated that this was accompanied by a near syncopal experience by the patient. Neither the patient nor his wife relate this part of the story to me. The patient states that the sensation was 7/10 in severity, was accompanied by shortness of breath, and lasted only about 4-5 minutes. He states that the sensation started in his wrists and travelled up his arms to his sternum and then up to his neck and back. R/O and no arrythmia on monitor and D/C home.   Reviewed records from [place] in 7/12. Had IMI with ruptured plaque. Stents in circ and LAD patent. Got another stent to RCA. EF 40% Note made of mural apical thrombus but no coumadin given   Seems to be functional class one. No exertional chest pain. No TIA;s. Compliant with meds.  Long discussion with him about criteria for AICD.  MRI done here 1/25 showed no indication for AICD at this time Reviewed multiple records from [facility] Dr Allyson Sabal Recent [facility] admission  [facility]   Time just reviewing records 30 minutes  Starting to get seasonal allergies.  Still splitting time between [facility] and here  Needs primary care doctor here  Cardiac MRI 03/21/11 reviewed Impression:  1) Moderate LVE EF 48% with distal anterior, septal, apical and  inferoapical dyskinesia  2) No mural apical thrombus  3) Full thickness scar involving the distal anterior wall, apex  and septum  4) MR should be looked at further with echo    5) Mild LAE   ROS: Denies fever, malais, weight loss, blurry vision, decreased visual acuity, cough, sputum, SOB, hemoptysis, pleuritic pain, palpitaitons, heartburn, abdominal pain, melena, lower extremity edema, claudication, or rash.  All other systems reviewed and negative  General: Affect appropriate Healthy:  appears stated age HEENT: normal Neck supple with no adenopathy JVP normal no bruits no thyromegaly Lungs clear with no wheezing and good diaphragmatic motion Heart:  S1/S2 no murmur, no rub, gallop or click PMI normal Abdomen: benighn, BS positve, no tenderness, no AAA no bruit.  No HSM or HJR Distal pulses intact with no bruits No edema Neuro non-focal Skin warm and dry No muscular weakness   Current Outpatient Prescriptions  Medication Sig Dispense Refill  . aspirin 325 MG EC tablet Take 325 mg by mouth daily.       . carvedilol (COREG) 3.125 MG tablet Take 1 tablet (3.125 mg total) by mouth 2 (two) times daily with a meal.  60 tablet  11  . clopidogrel (PLAVIX) 75 MG tablet Take 1 tablet (75 mg total) by mouth daily.  30 tablet  11  . Coenzyme Q10 (CO Q10) 100 MG TABS Take 1 tablet by mouth daily.        . Cyanocobalamin (VITAMIN B 12 PO) Take 500 mg by mouth 1 day or 1 dose.        . Fish Oil-Krill Oil (PA FISH OIL/KRILL PO) Take 300 mg by mouth daily.        Marland Kitchen  lisinopril (PRINIVIL,ZESTRIL) 10 MG tablet Take 1 tablet (10 mg total) by mouth daily.  30 tablet  11  . LORazepam (ATIVAN) 0.5 MG tablet Take 0.5 mg by mouth every 8 (eight) hours as needed. For anxiety      . nitroGLYCERIN (NITROSTAT) 0.4 MG SL tablet Place 1 tablet (0.4 mg total) under the tongue every 5 (five) minutes x 3 doses as needed for chest pain.  31 tablet  3  . pantoprazole (PROTONIX) 40 MG tablet Take 1 tablet (40 mg total) by mouth daily.  30 tablet  11  . vitamin C (ASCORBIC ACID) 500 MG tablet Take 500 mg by mouth daily.       Marland Kitchen zolpidem (AMBIEN) 10 MG tablet Take 10 mg by mouth at bedtime  as needed. For sleep        Allergies  Penicillins and Statins  Electrocardiogram:  Assessment and Plan

## 2011-05-09 NOTE — Patient Instructions (Signed)
Your physician wants you to follow-up in:  6 MONTHS WITH DR NISHAN  You will receive a reminder letter in the mail two months in advance. If you don't receive a letter, please call our office to schedule the follow-up appointment. Your physician recommends that you continue on your current medications as directed. Please refer to the Current Medication list given to you today. 

## 2011-05-09 NOTE — Assessment & Plan Note (Signed)
EF imporved no need for AICD.  Apical thrombus gone by MRI  Continue medical Rx Funcitonal class 1

## 2011-05-09 NOTE — Assessment & Plan Note (Signed)
Cholesterol is at goal.  Continue current dose of statin and diet Rx.  No myalgias or side effects.  F/U  LFT's in 6 months. Lab Results  Component Value Date   LDLCALC 77 01/08/2011             

## 2011-05-09 NOTE — Assessment & Plan Note (Signed)
Stable with no angina and good activity level.  Continue medical Rx P2Y shows Rx Plavix

## 2011-07-25 ENCOUNTER — Emergency Department (HOSPITAL_COMMUNITY): Payer: Medicare Other

## 2011-07-25 ENCOUNTER — Emergency Department (HOSPITAL_COMMUNITY)
Admission: EM | Admit: 2011-07-25 | Discharge: 2011-07-25 | Disposition: A | Discharge status: Home / Self Care | Payer: Medicare Other | Attending: Emergency Medicine | Admitting: Emergency Medicine

## 2011-07-25 ENCOUNTER — Encounter (HOSPITAL_COMMUNITY): Payer: Self-pay | Admitting: *Deleted

## 2011-07-25 DIAGNOSIS — I252 Old myocardial infarction: Secondary | ICD-10-CM | POA: Insufficient documentation

## 2011-07-25 DIAGNOSIS — K219 Gastro-esophageal reflux disease without esophagitis: Secondary | ICD-10-CM | POA: Insufficient documentation

## 2011-07-25 DIAGNOSIS — M25569 Pain in unspecified knee: Secondary | ICD-10-CM | POA: Insufficient documentation

## 2011-07-25 DIAGNOSIS — I2589 Other forms of chronic ischemic heart disease: Secondary | ICD-10-CM | POA: Insufficient documentation

## 2011-07-25 DIAGNOSIS — S83419A Sprain of medial collateral ligament of unspecified knee, initial encounter: Secondary | ICD-10-CM

## 2011-07-25 DIAGNOSIS — I2510 Atherosclerotic heart disease of native coronary artery without angina pectoris: Secondary | ICD-10-CM | POA: Insufficient documentation

## 2011-07-25 DIAGNOSIS — I10 Essential (primary) hypertension: Secondary | ICD-10-CM | POA: Insufficient documentation

## 2011-07-25 DIAGNOSIS — X58XXXA Exposure to other specified factors, initial encounter: Secondary | ICD-10-CM | POA: Insufficient documentation

## 2011-07-25 DIAGNOSIS — I509 Heart failure, unspecified: Secondary | ICD-10-CM | POA: Insufficient documentation

## 2011-07-25 DIAGNOSIS — Y92009 Unspecified place in unspecified non-institutional (private) residence as the place of occurrence of the external cause: Secondary | ICD-10-CM | POA: Insufficient documentation

## 2011-07-25 DIAGNOSIS — E7800 Pure hypercholesterolemia, unspecified: Secondary | ICD-10-CM | POA: Insufficient documentation

## 2011-07-25 MED ORDER — HYDROCODONE-ACETAMINOPHEN 5-325 MG PO TABS: 1.0000 | ORAL_TABLET | Freq: Four times a day (QID) | ORAL | Status: AC | PRN

## 2011-07-25 MED ORDER — HYDROCODONE-ACETAMINOPHEN 5-325 MG PO TABS
1.0000 | ORAL_TABLET | Freq: Once | ORAL | Status: AC
  Administered 2011-07-25: 1 via ORAL
  Filled 2011-07-25: qty 1

## 2011-07-25 NOTE — ED Provider Notes (Signed)
History     CSN: [id number]  Arrival date & time 07/25/11  1026   First MD Initiated Contact with Patient 07/25/11 1049      Chief Complaint  Patient presents with  . Knee Pain    (Consider location/radiation/quality/duration/timing/severity/associated sxs/prior treatment) HPI Patient presents to the emergency department with right knee pain following an injury yesterday.  Patient states the dog was running and struck the middle part of his right knee.  Patient states he did not fall or hit his knee in any other way.  Patient states that he does not have any numbness or weakness in his leg.  The patient has no swelling of the knee.  Patient is taking anything prior to arrival for his knee pain Past Medical History  Diagnosis Date  . Coronary artery disease     Pt with 4 stents over the course of 2003-2012  . Hypercholesterolemia   . Hypertension   . Elevated LFTs   . Myocardial infarction   . Ischemic cardiomyopathy   . Angina   . Arthritis   . GERD (gastroesophageal reflux disease)   . CHF (congestive heart failure)   . Heart murmur   . Shortness of breath   . Anxiety     Past Surgical History  Procedure Date  . Coronary angioplasty with stent placement     stents x4  . Cosmetic surgery     Family History  Problem Relation Age of Onset  . Coronary artery disease Mother   . Coronary artery disease Father     History  Substance Use Topics  . Smoking status: Former Smoker -- 0.0 packs/day for 15 years    Types: Cigarettes  . Smokeless tobacco: Not on file  . Alcohol Use: 0.6 oz/week    1 Glasses of wine per week     1 glass of wine per week      Review of Systems All other systems negative except as documented in the HPI. All pertinent positives and negatives as reviewed in the HPI.  Allergies  Penicillins and Statins  Home Medications   Current Outpatient Rx  Name Route Sig Dispense Refill  . ASPIRIN 325 MG PO TBEC Oral Take 325 mg by mouth daily.      Marland Kitchen CARVEDILOL 3.125 MG PO TABS Oral Take 1 tablet (3.125 mg total) by mouth 2 (two) times daily with a meal. 60 tablet 11  . CLOPIDOGREL BISULFATE 75 MG PO TABS Oral Take 1 tablet (75 mg total) by mouth daily. 30 tablet 11  . CO Q10 100 MG PO TABS Oral Take 1 tablet by mouth daily.      Marland Kitchen VITAMIN B 12 PO Oral Take 1 tablet by mouth daily.     Marland Kitchen PA FISH OIL/KRILL PO Oral Take 1 capsule by mouth daily.     Marland Kitchen LISINOPRIL 10 MG PO TABS Oral Take 1 tablet (10 mg total) by mouth daily. 30 tablet 11  . LORAZEPAM 0.5 MG PO TABS Oral Take 0.5 mg by mouth every 8 (eight) hours as needed. For anxiety    . NITROGLYCERIN 0.4 MG SL SUBL Sublingual Place 1 tablet (0.4 mg total) under the tongue every 5 (five) minutes x 3 doses as needed for chest pain. 31 tablet 3  . PANTOPRAZOLE SODIUM 40 MG PO TBEC Oral Take 1 tablet (40 mg total) by mouth daily. 30 tablet 11  . VITAMIN C 500 MG PO TABS Oral Take 500 mg by mouth daily.     Marland Kitchen  ZOLPIDEM TARTRATE 10 MG PO TABS Oral Take 5-10 mg by mouth at bedtime as needed. For sleep      BP 133/72  Pulse 74  Temp(Src) 97.9 F (36.6 C) (Oral)  Resp 18  SpO2 97%  Physical Exam  Nursing note and vitals reviewed. Constitutional: He appears well-developed and well-nourished. No distress.  Musculoskeletal:       Right knee: He exhibits normal range of motion, no swelling, no effusion, no ecchymosis, no deformity, no erythema, no bony tenderness and no MCL laxity. tenderness found. MCL tenderness noted.       Legs: Skin: Skin is warm and dry. No rash noted.    ED Course  Procedures (including critical care time)  Labs Reviewed - No data to display Dg Knee Complete 4 Views Right  07/25/2011  *RADIOLOGY REPORT*  Clinical Data: Knee pain  RIGHT KNEE - COMPLETE 4+ VIEW  Comparison: None.  Findings: Negative for fracture.  Joint spaces are normal.  There is no degenerative change or effusion.  IMPRESSION: Normal  Original Report Authenticated By: Camelia Phenes, M.D.     Patient be placed in a knee immobilizer and referred to orthopedics.  His knee films were negative for any acute findings.  Patient is advised ice and elevate his knee.  Told to return here as needed.   MDM  MDM Reviewed: vitals and nursing note Interpretation: x-ray            Carlyle Dolly, PA-C 07/25/11 1155

## 2011-07-25 NOTE — ED Notes (Signed)
Pt reports right knee pain. Pt was in a dog-run and one of the dogs ran into the medial side of his right knee yesterday. Pt reports pain started last night. Pt reports he is able to walk, but reports it is painful to bend knee. Pt has applied ice, but not taken anything for pain.

## 2011-07-25 NOTE — ED Provider Notes (Signed)
Medical screening examination/treatment/procedure(s) were performed by non-physician practitioner and as supervising physician I was immediately available for consultation/collaboration.  Sharra Cayabyab, MD 07/25/11 1558 

## 2011-07-25 NOTE — Discharge Instructions (Signed)
Follow up the orthopedist provided.  Ice and elevate your knee.  Wear the knee immobilizer for comfort when walking

## 2012-02-12 ENCOUNTER — Telehealth: Payer: Self-pay | Admitting: Cardiovascular Disease

## 2012-02-12 NOTE — Telephone Encounter (Signed)
New Problem:    I called the home number [phone]) listed for the patient and recieved a message the the number was either disconnected or was no longer in service. I called the emergency contact listed for the patient and was unable to reach them. I left a message on their voicemail with my name, the reason I called, the name of their physician, and a number to call back to schedule the patient's next appointment.

## 2012-03-02 ENCOUNTER — Emergency Department (HOSPITAL_COMMUNITY): Payer: No Typology Code available for payment source

## 2012-03-02 ENCOUNTER — Emergency Department (HOSPITAL_COMMUNITY)
Admission: EM | Admit: 2012-03-02 | Discharge: 2012-03-02 | Disposition: A | Discharge status: Home / Self Care | Payer: No Typology Code available for payment source | Attending: Emergency Medicine | Admitting: Emergency Medicine

## 2012-03-02 ENCOUNTER — Encounter (HOSPITAL_COMMUNITY): Payer: Self-pay | Admitting: Emergency Medicine

## 2012-03-02 DIAGNOSIS — K219 Gastro-esophageal reflux disease without esophagitis: Secondary | ICD-10-CM | POA: Insufficient documentation

## 2012-03-02 DIAGNOSIS — Z8739 Personal history of other diseases of the musculoskeletal system and connective tissue: Secondary | ICD-10-CM | POA: Insufficient documentation

## 2012-03-02 DIAGNOSIS — Z79899 Other long term (current) drug therapy: Secondary | ICD-10-CM | POA: Insufficient documentation

## 2012-03-02 DIAGNOSIS — F411 Generalized anxiety disorder: Secondary | ICD-10-CM | POA: Insufficient documentation

## 2012-03-02 DIAGNOSIS — I509 Heart failure, unspecified: Secondary | ICD-10-CM | POA: Insufficient documentation

## 2012-03-02 DIAGNOSIS — T148XXA Other injury of unspecified body region, initial encounter: Secondary | ICD-10-CM

## 2012-03-02 DIAGNOSIS — I2510 Atherosclerotic heart disease of native coronary artery without angina pectoris: Secondary | ICD-10-CM | POA: Insufficient documentation

## 2012-03-02 DIAGNOSIS — Y9389 Activity, other specified: Secondary | ICD-10-CM | POA: Insufficient documentation

## 2012-03-02 DIAGNOSIS — W19XXXA Unspecified fall, initial encounter: Secondary | ICD-10-CM

## 2012-03-02 DIAGNOSIS — S0003XA Contusion of scalp, initial encounter: Secondary | ICD-10-CM | POA: Insufficient documentation

## 2012-03-02 DIAGNOSIS — Z7982 Long term (current) use of aspirin: Secondary | ICD-10-CM | POA: Insufficient documentation

## 2012-03-02 DIAGNOSIS — R011 Cardiac murmur, unspecified: Secondary | ICD-10-CM | POA: Insufficient documentation

## 2012-03-02 DIAGNOSIS — Z7901 Long term (current) use of anticoagulants: Secondary | ICD-10-CM | POA: Insufficient documentation

## 2012-03-02 DIAGNOSIS — I10 Essential (primary) hypertension: Secondary | ICD-10-CM | POA: Insufficient documentation

## 2012-03-02 DIAGNOSIS — W010XXA Fall on same level from slipping, tripping and stumbling without subsequent striking against object, initial encounter: Secondary | ICD-10-CM | POA: Insufficient documentation

## 2012-03-02 DIAGNOSIS — Z9861 Coronary angioplasty status: Secondary | ICD-10-CM | POA: Insufficient documentation

## 2012-03-02 DIAGNOSIS — S1093XA Contusion of unspecified part of neck, initial encounter: Secondary | ICD-10-CM | POA: Insufficient documentation

## 2012-03-02 DIAGNOSIS — I252 Old myocardial infarction: Secondary | ICD-10-CM | POA: Insufficient documentation

## 2012-03-02 DIAGNOSIS — Y9289 Other specified places as the place of occurrence of the external cause: Secondary | ICD-10-CM | POA: Insufficient documentation

## 2012-03-02 DIAGNOSIS — E7800 Pure hypercholesterolemia, unspecified: Secondary | ICD-10-CM | POA: Insufficient documentation

## 2012-03-02 DIAGNOSIS — Z87891 Personal history of nicotine dependence: Secondary | ICD-10-CM | POA: Insufficient documentation

## 2012-03-02 MED ORDER — OXYCODONE-ACETAMINOPHEN 5-325 MG PO TABS: 1.0000 | ORAL_TABLET | ORAL | Status: DC | PRN

## 2012-03-02 MED ORDER — OXYCODONE-ACETAMINOPHEN 5-325 MG PO TABS
1.0000 | ORAL_TABLET | Freq: Once | ORAL | Status: AC
  Administered 2012-03-02: 1 via ORAL
  Filled 2012-03-02: qty 1

## 2012-03-02 MED ORDER — CYCLOBENZAPRINE HCL 10 MG PO TABS: 10.0000 mg | ORAL_TABLET | Freq: Three times a day (TID) | ORAL | Status: DC | PRN

## 2012-03-02 MED ORDER — HYDROCODONE-ACETAMINOPHEN 5-325 MG PO TABS
1.0000 | ORAL_TABLET | Freq: Once | ORAL | Status: AC
  Administered 2012-03-02: 1 via ORAL
  Filled 2012-03-02: qty 1

## 2012-03-02 NOTE — ED Notes (Signed)
Pt alert, arrives from home, c/o neck pain, right knee pain, bilateral elbows, s/p slip fall injury, onset was this afternoon, resp even unlabored, skin pwd, ambulates to triage

## 2012-03-02 NOTE — ED Provider Notes (Signed)
History     CSN: [id number]  Arrival date & time 03/02/12  4098   First MD Initiated Contact with Patient 03/02/12 2126      Chief Complaint  Patient presents with  . Fall  . Neck Pain   HPI  History provided by the patient. Patient is a 67 year old male with past history of hypertension, hyperlipidemia, CAD, CHF who presents with complaints of pain after a fall. Patient states she was coming down the stairs outside from his apartment complex and when reaching the bottom of the stairs he slipped on a large area of ice falling flat onto his back. Patient denies significant head injury or trauma with no LOC. He complains that he fell back onto the steps hitting his mid back and neck area. He complains of pain to this location as well as increasing pain to his right knee. He complains of prior history of knee problems and pains in the past. He states he only recently his knee problems have been improving. He denies any other significant injuries her pains. He has some soreness to his elbows but denies any significant pain or reduced range of motion. He denies any headache, dizziness or lightheadedness. He denies any numbness or weakness in extremities. He denies any chest or rib pains and no shortness of breath.    Past Medical History  Diagnosis Date  . Coronary artery disease     Pt with 4 stents over the course of 2003-2012  . Hypercholesterolemia   . Hypertension   . Elevated LFTs   . Myocardial infarction   . Ischemic cardiomyopathy   . Angina   . Arthritis   . GERD (gastroesophageal reflux disease)   . CHF (congestive heart failure)   . Heart murmur   . Shortness of breath   . Anxiety     Past Surgical History  Procedure Date  . Coronary angioplasty with stent placement     stents x4  . Cosmetic surgery     Family History  Problem Relation Age of Onset  . Coronary artery disease Mother   . Coronary artery disease Father     History  Substance Use Topics  .  Smoking status: Former Smoker -- 0.0 packs/day for 15 years    Types: Cigarettes  . Smokeless tobacco: Not on file  . Alcohol Use: 0.6 oz/week    1 Glasses of wine per week     Comment: 1 glass of wine per week      Review of Systems  Neurological: Negative for weakness, light-headedness, numbness and headaches.  All other systems reviewed and are negative.    Allergies  Penicillins and Statins  Home Medications   Current Outpatient Rx  Name  Route  Sig  Dispense  Refill  . ASPIRIN 325 MG PO TBEC   Oral   Take 325 mg by mouth daily.          Marland Kitchen CARVEDILOL 3.125 MG PO TABS   Oral   Take 1 tablet (3.125 mg total) by mouth 2 (two) times daily with a meal.   60 tablet   11   . CLOPIDOGREL BISULFATE 75 MG PO TABS   Oral   Take 1 tablet (75 mg total) by mouth daily.   30 tablet   11   . CO Q10 100 MG PO TABS   Oral   Take 1 tablet by mouth daily.           Marland Kitchen VITAMIN B 12 PO  Oral   Take 1 tablet by mouth daily.          Marland Kitchen PA FISH OIL/KRILL PO   Oral   Take 1 capsule by mouth daily.          Marland Kitchen HYDROCORTISONE 0.5 % EX CREA   Topical   Apply 1 application topically 2 (two) times daily.         Marland Kitchen LISINOPRIL 10 MG PO TABS   Oral   Take 1 tablet (10 mg total) by mouth daily.   30 tablet   11   . LORAZEPAM 0.5 MG PO TABS   Oral   Take 0.5 mg by mouth every 8 (eight) hours as needed. For anxiety         . PANTOPRAZOLE SODIUM 40 MG PO TBEC   Oral   Take 1 tablet (40 mg total) by mouth daily.   30 tablet   11   . VITAMIN C 500 MG PO TABS   Oral   Take 500 mg by mouth daily.          Marland Kitchen ZOLPIDEM TARTRATE 10 MG PO TABS   Oral   Take 5-10 mg by mouth at bedtime as needed. For sleep         . CYCLOBENZAPRINE HCL 10 MG PO TABS   Oral   Take 1 tablet (10 mg total) by mouth 3 (three) times daily as needed for muscle spasms.   30 tablet   0   . NITROGLYCERIN 0.4 MG SL SUBL   Sublingual   Place 1 tablet (0.4 mg total) under the tongue every  5 (five) minutes x 3 doses as needed for chest pain.   31 tablet   3   . OXYCODONE-ACETAMINOPHEN 5-325 MG PO TABS   Oral   Take 1 tablet by mouth every 4 (four) hours as needed for pain.   5 tablet   0     BP 138/79  Pulse 78  Temp 98.5 F (36.9 C) (Oral)  Resp 18  SpO2 97%  Physical Exam  Nursing note and vitals reviewed. Constitutional: He appears well-developed and well-nourished. No distress.  HENT:  Head: Normocephalic.  Neck: Normal range of motion. Neck supple.       Paraspinous tenderness. No deformities or step-offs. Full range of motion.  Cardiovascular: Normal rate and regular rhythm.   Pulmonary/Chest: Effort normal and breath sounds normal.  Abdominal: Soft.  Musculoskeletal:       Pain with range of motion of right knee otherwise full. No significant signs of joint effusion. No deformities. No crepitus.  Neurological: He is alert.  Skin: Skin is warm.  Psychiatric: He has a normal mood and affect. His behavior is normal.    ED Course  Procedures   Dg Cervical Spine Complete  03/02/2012  *RADIOLOGY REPORT*  Clinical Data: Fall, neck pain.  CERVICAL SPINE - COMPLETE 4+ VIEW  Comparison: Plain films cervical spine 01/08/2011.  Findings: There is no fracture or subluxation of the cervical spine. Prevertebral soft tissues appear normal. Multilevel degenerative change appears worst at C5-6 and is unchanged.  IMPRESSION: No acute finding. Degenerative disease.   Original Report Authenticated By: Holley Dexter, M.D.    Dg Knee Complete 4 Views Right  03/02/2012  *RADIOLOGY REPORT*  Clinical Data: Fall, knee pain.  RIGHT KNEE - COMPLETE 4+ VIEW  Comparison: Plain films 07/25/2011.  Findings: Imaged bones, joints and soft tissues appear normal.  IMPRESSION: Negative exam.   Original Report Authenticated  By: Holley Dexter, M.D.      1. Fall   2. Contusion   3. Muscle strain       MDM  10:00 PM patient seen and evaluated. Patient appears well in no acute  distress. No significant signs of trauma on exam.  X-rays unremarkable. This time we'll discharge patient home with symptomatic treatment and orthopedic referral.        Angus Seller, PA 03/03/12 [phone]

## 2012-03-03 NOTE — ED Provider Notes (Signed)
Medical screening examination/treatment/procedure(s) were performed by non-physician practitioner and as supervising physician I was immediately available for consultation/collaboration.  Tobin Chad, MD 03/03/12 2352

## 2012-03-13 ENCOUNTER — Encounter (HOSPITAL_COMMUNITY): Payer: Self-pay | Admitting: Emergency Medicine

## 2012-03-13 ENCOUNTER — Emergency Department (HOSPITAL_COMMUNITY)
Admission: EM | Admit: 2012-03-13 | Discharge: 2012-03-13 | Disposition: A | Discharge status: Home / Self Care | Payer: PRIVATE HEALTH INSURANCE | Attending: Emergency Medicine | Admitting: Emergency Medicine

## 2012-03-13 DIAGNOSIS — Z8709 Personal history of other diseases of the respiratory system: Secondary | ICD-10-CM | POA: Insufficient documentation

## 2012-03-13 DIAGNOSIS — R300 Dysuria: Secondary | ICD-10-CM | POA: Insufficient documentation

## 2012-03-13 DIAGNOSIS — I509 Heart failure, unspecified: Secondary | ICD-10-CM | POA: Insufficient documentation

## 2012-03-13 DIAGNOSIS — K219 Gastro-esophageal reflux disease without esophagitis: Secondary | ICD-10-CM | POA: Insufficient documentation

## 2012-03-13 DIAGNOSIS — Z87891 Personal history of nicotine dependence: Secondary | ICD-10-CM | POA: Insufficient documentation

## 2012-03-13 DIAGNOSIS — I252 Old myocardial infarction: Secondary | ICD-10-CM | POA: Insufficient documentation

## 2012-03-13 DIAGNOSIS — R109 Unspecified abdominal pain: Secondary | ICD-10-CM

## 2012-03-13 DIAGNOSIS — I2510 Atherosclerotic heart disease of native coronary artery without angina pectoris: Secondary | ICD-10-CM | POA: Insufficient documentation

## 2012-03-13 DIAGNOSIS — E7800 Pure hypercholesterolemia, unspecified: Secondary | ICD-10-CM | POA: Insufficient documentation

## 2012-03-13 DIAGNOSIS — Z7982 Long term (current) use of aspirin: Secondary | ICD-10-CM | POA: Insufficient documentation

## 2012-03-13 DIAGNOSIS — R197 Diarrhea, unspecified: Secondary | ICD-10-CM | POA: Insufficient documentation

## 2012-03-13 DIAGNOSIS — R5381 Other malaise: Secondary | ICD-10-CM | POA: Insufficient documentation

## 2012-03-13 DIAGNOSIS — Z79899 Other long term (current) drug therapy: Secondary | ICD-10-CM | POA: Insufficient documentation

## 2012-03-13 DIAGNOSIS — K5900 Constipation, unspecified: Secondary | ICD-10-CM | POA: Insufficient documentation

## 2012-03-13 DIAGNOSIS — Z8679 Personal history of other diseases of the circulatory system: Secondary | ICD-10-CM | POA: Insufficient documentation

## 2012-03-13 DIAGNOSIS — I10 Essential (primary) hypertension: Secondary | ICD-10-CM | POA: Insufficient documentation

## 2012-03-13 DIAGNOSIS — R5383 Other fatigue: Secondary | ICD-10-CM | POA: Insufficient documentation

## 2012-03-13 DIAGNOSIS — Z8739 Personal history of other diseases of the musculoskeletal system and connective tissue: Secondary | ICD-10-CM | POA: Insufficient documentation

## 2012-03-13 DIAGNOSIS — R011 Cardiac murmur, unspecified: Secondary | ICD-10-CM | POA: Insufficient documentation

## 2012-03-13 DIAGNOSIS — F411 Generalized anxiety disorder: Secondary | ICD-10-CM | POA: Insufficient documentation

## 2012-03-13 LAB — BASIC METABOLIC PANEL
BUN: 20 mg/dL (ref 6–23)
Calcium: 10 mg/dL (ref 8.4–10.5)
Calcium: 9.7 mg/dL (ref 8.4–10.5)
GFR calc Af Amer: 83 mL/min — ABNORMAL LOW (ref >90)
GFR calc Af Amer: 77 mL/min — ABNORMAL LOW (ref >90)
GFR calc non Af Amer: 71 mL/min — ABNORMAL LOW (ref >90)
GFR calc non Af Amer: 67 mL/min — ABNORMAL LOW (ref >90)
Glucose, Bld: 100 mg/dL — ABNORMAL HIGH (ref 70–99)
Potassium: 4.4 mEq/L (ref 3.5–5.1)
Sodium: 133 mEq/L — ABNORMAL LOW (ref 135–145)
Sodium: 136 mEq/L (ref 135–145)

## 2012-03-13 LAB — CBC WITH DIFFERENTIAL/PLATELET
Basophils Relative: 0 % (ref 0–1)
Eosinophils Absolute: 0.2 10*3/uL (ref 0.0–0.7)
Eosinophils Relative: 3 % (ref 0–5)
HCT: 42.5 % (ref 39.0–52.0)
Hemoglobin: 14.8 g/dL (ref 13.0–17.0)
Lymphocytes Relative: 21 % (ref 12–46)
Lymphs Abs: 1.7 10*3/uL (ref 0.7–4.0)
Neutro Abs: 5.4 10*3/uL (ref 1.7–7.7)
RBC: 4.61 MIL/uL (ref 4.22–5.81)
RDW: 12.2 % (ref 11.5–15.5)
WBC: 8 10*3/uL (ref 4.0–10.5)

## 2012-03-13 LAB — URINALYSIS, ROUTINE W REFLEX MICROSCOPIC
Bilirubin Urine: NEGATIVE
Glucose, UA: NEGATIVE mg/dL
Ketones, ur: NEGATIVE mg/dL
pH: 6 (ref 5.0–8.0)

## 2012-03-13 LAB — URINE MICROSCOPIC-ADD ON

## 2012-03-13 MED ORDER — CIPROFLOXACIN HCL 500 MG PO TABS: 500.0000 mg | ORAL_TABLET | Freq: Two times a day (BID) | ORAL | Status: DC

## 2012-03-13 MED ORDER — METRONIDAZOLE 500 MG PO TABS: 500.0000 mg | ORAL_TABLET | Freq: Three times a day (TID) | ORAL | Status: DC

## 2012-03-13 MED ORDER — SODIUM CHLORIDE 0.9 % IV BOLUS (SEPSIS)
500.0000 mL | Freq: Once | INTRAVENOUS | Status: AC
  Administered 2012-03-13: 500 mL via INTRAVENOUS

## 2012-03-13 MED ORDER — OXYCODONE-ACETAMINOPHEN 5-325 MG PO TABS: 1.0000 | ORAL_TABLET | Freq: Four times a day (QID) | ORAL | Status: DC | PRN

## 2012-03-13 NOTE — ED Notes (Signed)
Patient requested food.  He was informed that all test needed to be resulted before he  Could be given food.

## 2012-03-13 NOTE — ED Notes (Signed)
Patient is alert and oriented x3.  He was given DC instructions and follow up visit instructions.  Patient gave verbal understanding.  He was DC ambulatory under his own power to home.  V/S stable.  He was not showing any signs of distress on DC 

## 2012-03-13 NOTE — ED Notes (Signed)
Pt presents w/ right groin pain, going on for several weeks. Endorses long term hx of constipation/diarrhea cyclical. Now is having some dysuria as well.

## 2012-03-13 NOTE — ED Provider Notes (Signed)
History     CSN: [id number]  Arrival date & time 03/13/12  1347   First MD Initiated Contact with Patient 03/13/12 1611      Chief Complaint  Patient presents with  . Groin Pain  . Dysuria    (Consider location/radiation/quality/duration/timing/severity/associated sxs/prior treatment) Patient is a 67 y.o. male presenting with groin pain and dysuria. The history is provided by the patient.  Groin Pain This is a recurrent problem. Associated symptoms include abdominal pain. Pertinent negatives include no chest pain, no headaches and no shortness of breath.  Dysuria  Pertinent negatives include no nausea, no vomiting and no flank pain.   patient's had lower abdominal/groin pain for last several weeks. Has a history of constipation and diarrhea. He states it feels like his previous diverticulitis. No fevers. No cough. He states he feels weak all over. He states the diarrhea has improved and is heading towards constipation. He has had some dysuria also. He states he feels strange is for her. 2. He states when he feels like that she normally has to get Cipro.  Past Medical History  Diagnosis Date  . Coronary artery disease     Pt with 4 stents over the course of 2003-2012  . Hypercholesterolemia   . Hypertension   . Elevated LFTs   . Myocardial infarction   . Ischemic cardiomyopathy   . Angina   . Arthritis   . GERD (gastroesophageal reflux disease)   . CHF (congestive heart failure)   . Heart murmur   . Shortness of breath   . Anxiety     Past Surgical History  Procedure Date  . Coronary angioplasty with stent placement     stents x4  . Cosmetic surgery     Family History  Problem Relation Age of Onset  . Coronary artery disease Mother   . Coronary artery disease Father     History  Substance Use Topics  . Smoking status: Former Smoker -- 0.0 packs/day for 15 years    Types: Cigarettes  . Smokeless tobacco: Never Used  . Alcohol Use: 0.6 oz/week    1 Glasses of  wine per week     Comment: 1 glass of wine per week      Review of Systems  Constitutional: Negative for activity change and appetite change.  HENT: Negative for neck stiffness.   Eyes: Negative for pain.  Respiratory: Negative for chest tightness and shortness of breath.   Cardiovascular: Negative for chest pain and leg swelling.  Gastrointestinal: Positive for abdominal pain, diarrhea and constipation. Negative for nausea and vomiting.  Genitourinary: Positive for dysuria. Negative for flank pain, discharge and testicular pain.  Musculoskeletal: Negative for back pain.  Skin: Negative for rash.  Neurological: Negative for weakness, numbness and headaches.  Psychiatric/Behavioral: Negative for behavioral problems.    Allergies  Penicillins and Statins  Home Medications   Current Outpatient Rx  Name  Route  Sig  Dispense  Refill  . ASPIRIN 325 MG PO TBEC   Oral   Take 325 mg by mouth daily.          Marland Kitchen CARVEDILOL 3.125 MG PO TABS   Oral   Take 1 tablet (3.125 mg total) by mouth 2 (two) times daily with a meal.   60 tablet   11   . CLOPIDOGREL BISULFATE 75 MG PO TABS   Oral   Take 1 tablet (75 mg total) by mouth daily.   30 tablet   11   .  CO Q10 100 MG PO TABS   Oral   Take 1 tablet by mouth daily.           . OMEGA-3 FATTY ACIDS 1000 MG PO CAPS   Oral   Take 1 g by mouth daily.         Marland Kitchen LISINOPRIL 10 MG PO TABS   Oral   Take 1 tablet (10 mg total) by mouth daily.   30 tablet   11   . LORAZEPAM 0.5 MG PO TABS   Oral   Take 0.5 mg by mouth every 8 (eight) hours as needed. For anxiety         . NITROGLYCERIN 0.4 MG SL SUBL   Sublingual   Place 0.4 mg under the tongue every 5 (five) minutes x 3 doses as needed. For chest pain.         Marland Kitchen PANTOPRAZOLE SODIUM 40 MG PO TBEC   Oral   Take 1 tablet (40 mg total) by mouth daily.   30 tablet   11   . VITAMIN B-12 1000 MCG PO TABS   Oral   Take 1,000 mcg by mouth daily.         Marland Kitchen VITAMIN C  500 MG PO TABS   Oral   Take 500 mg by mouth daily.          Marland Kitchen ZOLPIDEM TARTRATE 10 MG PO TABS   Oral   Take 5-10 mg by mouth at bedtime as needed. For sleep         . CIPROFLOXACIN HCL 500 MG PO TABS   Oral   Take 1 tablet (500 mg total) by mouth every 12 (twelve) hours.   10 tablet   0   . METRONIDAZOLE 500 MG PO TABS   Oral   Take 1 tablet (500 mg total) by mouth 3 (three) times daily.   15 tablet   0   . OXYCODONE-ACETAMINOPHEN 5-325 MG PO TABS   Oral   Take 1-2 tablets by mouth every 6 (six) hours as needed for pain.   10 tablet   0     BP 131/69  Pulse 65  Temp 98.2 F (36.8 C) (Oral)  Resp 18  SpO2 99%  Physical Exam  Nursing note and vitals reviewed. Constitutional: He is oriented to person, place, and time. He appears well-developed and well-nourished.  HENT:  Head: Normocephalic and atraumatic.  Eyes: EOM are normal. Pupils are equal, round, and reactive to light.  Neck: Normal range of motion. Neck supple.  Cardiovascular: Normal rate, regular rhythm and normal heart sounds.   No murmur heard. Pulmonary/Chest: Effort normal and breath sounds normal.  Abdominal: Soft. Bowel sounds are normal. He exhibits no distension and no mass. There is tenderness. There is no rebound and no guarding.       No right inguinal mass. No hernia palpated. No testicular tenderness. Some perineal tenderness.  Genitourinary:       Perineal tenderness  Musculoskeletal: Normal range of motion. He exhibits no edema.  Neurological: He is alert and oriented to person, place, and time. No cranial nerve deficit.  Skin: Skin is warm and dry.  Psychiatric: He has a normal mood and affect.    ED Course  Procedures (including critical care time)  Labs Reviewed  URINALYSIS, ROUTINE W REFLEX MICROSCOPIC - Abnormal; Notable for the following:    Hgb urine dipstick MODERATE (*)     All other components within normal limits  BASIC METABOLIC  PANEL - Abnormal; Notable for the  following:    Sodium 133 (*)     Glucose, Bld 100 (*)  DISREGARD   GFR calc non Af Amer 71 (*)  DISREGARD   GFR calc Af Amer 83 (*)     All other components within normal limits  BASIC METABOLIC PANEL - Abnormal; Notable for the following:    GFR calc non Af Amer 67 (*)     GFR calc Af Amer 77 (*)     All other components within normal limits  URINE MICROSCOPIC-ADD ON  CBC WITH DIFFERENTIAL   No results found.   1. Abdominal pain       MDM  Patient with lower abdominal pain and bowel symptoms. Also has had some dysuria. His history chronic bowel problems. He has a history of diverticulosis. This potentially could be diverticulitis. Also has potentially prostatitis. We'll treat with antibiotics which should cover both. I doubt severe abscess. He'll followup with his gastroenterologist as needed.        Juliet Rude. Rubin Payor, MD 03/13/12 2045

## 2012-04-26 ENCOUNTER — Other Ambulatory Visit: Payer: Self-pay | Admitting: *Deleted

## 2012-04-26 MED ORDER — CARVEDILOL 3.125 MG PO TABS: 3.1250 mg | ORAL_TABLET | Freq: Two times a day (BID) | ORAL | Status: DC

## 2012-04-26 MED ORDER — LISINOPRIL 10 MG PO TABS: 10.0000 mg | ORAL_TABLET | Freq: Every day | ORAL | Status: DC

## 2012-04-26 MED ORDER — PANTOPRAZOLE SODIUM 40 MG PO TBEC: 40.0000 mg | DELAYED_RELEASE_TABLET | Freq: Every day | ORAL | Status: AC

## 2012-04-28 ENCOUNTER — Other Ambulatory Visit: Payer: Self-pay | Admitting: *Deleted

## 2012-04-28 MED ORDER — CLOPIDOGREL BISULFATE 75 MG PO TABS: 75.0000 mg | ORAL_TABLET | Freq: Every day | ORAL | Status: DC

## 2012-06-10 ENCOUNTER — Encounter (HOSPITAL_COMMUNITY): Payer: Self-pay

## 2012-06-10 ENCOUNTER — Observation Stay (HOSPITAL_COMMUNITY)
Admission: EM | Admit: 2012-06-10 | Discharge: 2012-06-11 | Disposition: A | Discharge status: Home / Self Care | Payer: Medicare (Managed Care) | Attending: Cardiology | Admitting: Cardiology

## 2012-06-10 ENCOUNTER — Emergency Department (HOSPITAL_COMMUNITY): Payer: Medicare (Managed Care)

## 2012-06-10 DIAGNOSIS — R079 Chest pain, unspecified: Secondary | ICD-10-CM | POA: Insufficient documentation

## 2012-06-10 DIAGNOSIS — E7800 Pure hypercholesterolemia, unspecified: Secondary | ICD-10-CM | POA: Insufficient documentation

## 2012-06-10 DIAGNOSIS — I2510 Atherosclerotic heart disease of native coronary artery without angina pectoris: Principal | ICD-10-CM | POA: Insufficient documentation

## 2012-06-10 DIAGNOSIS — R0609 Other forms of dyspnea: Secondary | ICD-10-CM | POA: Insufficient documentation

## 2012-06-10 DIAGNOSIS — Z9861 Coronary angioplasty status: Secondary | ICD-10-CM | POA: Insufficient documentation

## 2012-06-10 DIAGNOSIS — I200 Unstable angina: Secondary | ICD-10-CM | POA: Insufficient documentation

## 2012-06-10 DIAGNOSIS — I255 Ischemic cardiomyopathy: Secondary | ICD-10-CM | POA: Diagnosis present

## 2012-06-10 DIAGNOSIS — I252 Old myocardial infarction: Secondary | ICD-10-CM | POA: Insufficient documentation

## 2012-06-10 DIAGNOSIS — I519 Heart disease, unspecified: Secondary | ICD-10-CM | POA: Insufficient documentation

## 2012-06-10 DIAGNOSIS — E785 Hyperlipidemia, unspecified: Secondary | ICD-10-CM | POA: Insufficient documentation

## 2012-06-10 DIAGNOSIS — I509 Heart failure, unspecified: Secondary | ICD-10-CM | POA: Insufficient documentation

## 2012-06-10 DIAGNOSIS — I10 Essential (primary) hypertension: Secondary | ICD-10-CM | POA: Insufficient documentation

## 2012-06-10 DIAGNOSIS — R0989 Other specified symptoms and signs involving the circulatory and respiratory systems: Secondary | ICD-10-CM | POA: Insufficient documentation

## 2012-06-10 DIAGNOSIS — R61 Generalized hyperhidrosis: Secondary | ICD-10-CM | POA: Insufficient documentation

## 2012-06-10 DIAGNOSIS — I2589 Other forms of chronic ischemic heart disease: Secondary | ICD-10-CM | POA: Insufficient documentation

## 2012-06-10 LAB — COMPREHENSIVE METABOLIC PANEL
ALT: 28 U/L (ref 0–53)
Albumin: 3.6 g/dL (ref 3.5–5.2)
Alkaline Phosphatase: 49 U/L (ref 39–117)
BUN: 19 mg/dL (ref 6–23)
Chloride: 105 mEq/L (ref 96–112)
GFR calc Af Amer: 70 mL/min — ABNORMAL LOW (ref >90)
Glucose, Bld: 100 mg/dL — ABNORMAL HIGH (ref 70–99)
Potassium: 4.4 mEq/L (ref 3.5–5.1)
Sodium: 139 mEq/L (ref 135–145)
Total Bilirubin: 0.3 mg/dL (ref 0.3–1.2)
Total Protein: 6.8 g/dL (ref 6.0–8.3)

## 2012-06-10 LAB — APTT: aPTT: 91 seconds — ABNORMAL HIGH (ref 24–37)

## 2012-06-10 LAB — CBC WITH DIFFERENTIAL/PLATELET
Hemoglobin: 12.7 g/dL — ABNORMAL LOW (ref 13.0–17.0)
Lymphs Abs: 1.8 10*3/uL (ref 0.7–4.0)
Monocytes Relative: 8 % (ref 3–12)
Neutro Abs: 2.6 10*3/uL (ref 1.7–7.7)
Neutrophils Relative %: 53 % (ref 43–77)
Platelets: 197 10*3/uL (ref 150–400)
RBC: 3.99 MIL/uL — ABNORMAL LOW (ref 4.22–5.81)
WBC: 4.9 10*3/uL (ref 4.0–10.5)

## 2012-06-10 LAB — TROPONIN I: Troponin I: <0.3 ng/mL (ref <0.30)

## 2012-06-10 MED ORDER — CLOPIDOGREL BISULFATE 75 MG PO TABS
75.0000 mg | ORAL_TABLET | Freq: Every day | ORAL | Status: DC
  Administered 2012-06-11: 75 mg via ORAL
  Filled 2012-06-10 (×2): qty 1

## 2012-06-10 MED ORDER — SODIUM CHLORIDE 0.9 % IV SOLN: 250.0000 mL | INTRAVENOUS | Status: DC | PRN

## 2012-06-10 MED ORDER — VITAMIN C 500 MG PO TABS
500.0000 mg | ORAL_TABLET | Freq: Every day | ORAL | Status: DC
  Filled 2012-06-10: qty 1

## 2012-06-10 MED ORDER — ONDANSETRON HCL 4 MG/2ML IJ SOLN: 4.0000 mg | Freq: Four times a day (QID) | INTRAMUSCULAR | Status: DC | PRN

## 2012-06-10 MED ORDER — ASPIRIN EC 325 MG PO TBEC: 325.0000 mg | DELAYED_RELEASE_TABLET | Freq: Every day | ORAL | Status: DC

## 2012-06-10 MED ORDER — LORAZEPAM 0.5 MG PO TABS: 0.5000 mg | ORAL_TABLET | Freq: Three times a day (TID) | ORAL | Status: DC | PRN

## 2012-06-10 MED ORDER — ACETAMINOPHEN 325 MG PO TABS: 650.0000 mg | ORAL_TABLET | ORAL | Status: DC | PRN

## 2012-06-10 MED ORDER — OMEGA-3-ACID ETHYL ESTERS 1 G PO CAPS
1.0000 g | ORAL_CAPSULE | Freq: Every day | ORAL | Status: DC
  Filled 2012-06-10: qty 1

## 2012-06-10 MED ORDER — HEPARIN (PORCINE) IN NACL 100-0.45 UNIT/ML-% IJ SOLN
1250.0000 [IU]/h | INTRAMUSCULAR | Status: DC
  Administered 2012-06-10: 1150 [IU]/h via INTRAVENOUS
  Filled 2012-06-10 (×2): qty 250

## 2012-06-10 MED ORDER — ALPRAZOLAM 0.25 MG PO TABS: 0.2500 mg | ORAL_TABLET | Freq: Two times a day (BID) | ORAL | Status: DC | PRN

## 2012-06-10 MED ORDER — CO Q10 100 MG PO TABS: 1.0000 | ORAL_TABLET | Freq: Every day | ORAL | Status: DC

## 2012-06-10 MED ORDER — MORPHINE SULFATE 4 MG/ML IJ SOLN
4.0000 mg | Freq: Once | INTRAMUSCULAR | Status: AC
  Administered 2012-06-10: 4 mg via INTRAVENOUS
  Filled 2012-06-10: qty 1

## 2012-06-10 MED ORDER — SODIUM CHLORIDE 0.9 % IJ SOLN: 3.0000 mL | Freq: Two times a day (BID) | INTRAMUSCULAR | Status: DC

## 2012-06-10 MED ORDER — NITROGLYCERIN 2 % TD OINT
1.0000 [in_us] | TOPICAL_OINTMENT | Freq: Once | TRANSDERMAL | Status: AC
  Administered 2012-06-10: 1 [in_us] via TOPICAL
  Filled 2012-06-10: qty 1

## 2012-06-10 MED ORDER — ZOLPIDEM TARTRATE 5 MG PO TABS: 10.0000 mg | ORAL_TABLET | Freq: Every evening | ORAL | Status: DC | PRN

## 2012-06-10 MED ORDER — SODIUM CHLORIDE 0.9 % IJ SOLN: 3.0000 mL | INTRAMUSCULAR | Status: DC | PRN

## 2012-06-10 MED ORDER — HEPARIN BOLUS VIA INFUSION
4000.0000 [IU] | Freq: Once | INTRAVENOUS | Status: AC
  Administered 2012-06-10: 4000 [IU] via INTRAVENOUS

## 2012-06-10 MED ORDER — LISINOPRIL 10 MG PO TABS
10.0000 mg | ORAL_TABLET | Freq: Every day | ORAL | Status: DC
  Administered 2012-06-11: 10 mg via ORAL
  Filled 2012-06-10 (×2): qty 1

## 2012-06-10 MED ORDER — OMEGA-3 FATTY ACIDS 1000 MG PO CAPS: 1.0000 g | ORAL_CAPSULE | Freq: Every day | ORAL | Status: DC

## 2012-06-10 MED ORDER — NITROGLYCERIN 0.4 MG SL SUBL: 0.4000 mg | SUBLINGUAL_TABLET | SUBLINGUAL | Status: DC | PRN

## 2012-06-10 MED ORDER — DIAZEPAM 5 MG PO TABS
5.0000 mg | ORAL_TABLET | ORAL | Status: AC
  Administered 2012-06-11: 5 mg via ORAL
  Filled 2012-06-10: qty 1

## 2012-06-10 MED ORDER — VITAMIN B-12 1000 MCG PO TABS
1000.0000 ug | ORAL_TABLET | Freq: Every day | ORAL | Status: DC
  Filled 2012-06-10: qty 1

## 2012-06-10 MED ORDER — ZOLPIDEM TARTRATE 5 MG PO TABS: 5.0000 mg | ORAL_TABLET | Freq: Every evening | ORAL | Status: DC | PRN

## 2012-06-10 MED ORDER — SODIUM CHLORIDE 0.9 % IV SOLN
1.0000 mL/kg/h | INTRAVENOUS | Status: DC
  Administered 2012-06-11: 1 mL/kg/h via INTRAVENOUS

## 2012-06-10 MED ORDER — CARVEDILOL 3.125 MG PO TABS
3.1250 mg | ORAL_TABLET | Freq: Two times a day (BID) | ORAL | Status: DC
  Filled 2012-06-10 (×3): qty 1

## 2012-06-10 MED ORDER — OXYCODONE-ACETAMINOPHEN 5-325 MG PO TABS: 1.0000 | ORAL_TABLET | Freq: Four times a day (QID) | ORAL | Status: DC | PRN

## 2012-06-10 MED ORDER — ASPIRIN 81 MG PO CHEW
324.0000 mg | CHEWABLE_TABLET | ORAL | Status: AC
  Administered 2012-06-11: 324 mg via ORAL
  Filled 2012-06-10: qty 4

## 2012-06-10 MED ORDER — PANTOPRAZOLE SODIUM 40 MG PO TBEC
40.0000 mg | DELAYED_RELEASE_TABLET | Freq: Every day | ORAL | Status: DC
  Filled 2012-06-10: qty 1

## 2012-06-10 NOTE — H&P (Signed)
Physician History and Physical    Patient ID: [name] MRN: [id number] DOB/AGE: 1945-08-13 67 y.o. Admit date: 06/10/2012  Primary Care Physician: Primary Cardiologist [name] Haws MD  HPI: [name] is a 67 year old white male with history of complex coronary disease. He suffered his first myocardial infarction in 2003 and underwent stenting of the LAD while in [place]. In 2009 he had a recurrent anterior STEMI with repeat stenting of the LAD with a Promus stent. He also underwent stenting of the left circumflex into the second obtuse marginal vessel with an Endeavor stent. In October 2009 he had repeat cardiac catheterization which demonstrated the stents were all patent. In July of 2012 he suffered an inferior myocardial infarction again while in [place]. He had stenting of the right coronary at that time. Apparently had a history of a mural thrombus. MRI was performed here this past year showed no evidence of mural thrombus and an ejection fraction of 48%. Patient states he was in his usual state of health until approximately 2 weeks ago. He began experiencing symptoms of increased pressure in his chest. Today he was walking in a park with his wife when he had more severe chest pressure radiating into his neck and left arm. The pain was 7/10. He took 2 sublingual nitroglycerin without relief. He received additional sublingual nitroglycerin by EMS but has had persistent chest pressure for 2-1/2 hours. His symptoms are similar to his prior cardiac evaluation. Patient quit smoking in 1985. He does have a history of severe statin intolerance. He is compliant with his medication. Typically he may have some chest pressure when he is under periods of stress but this is very mild.  Review of systems complete and found to be negative unless listed above  Past Medical History  Diagnosis Date  . Coronary artery disease     Pt with 4 stents over the course of 2003-2012  . Hypercholesterolemia   .  Hypertension   . Elevated LFTs   . Myocardial infarction   . Ischemic cardiomyopathy   . Angina   . Arthritis   . GERD (gastroesophageal reflux disease)   . CHF (congestive heart failure)   . Heart murmur   . Shortness of breath   . Anxiety     Family History  Problem Relation Age of Onset  . Coronary artery disease Mother   . Coronary artery disease Father     History   Social History  . Marital Status: Divorced    Spouse Name: N/A    Number of Children: N/A  . Years of Education: N/A   Occupational History  . Not on file.   Social History Main Topics  . Smoking status: Former Smoker -- 0.05 packs/day for 15 years    Types: Cigarettes  . Smokeless tobacco: Never Used  . Alcohol Use: 0.6 oz/week    1 Glasses of wine per week     Comment: 1 glass of wine per week  . Drug Use: No  . Sexually Active: Yes   Other Topics Concern  . Not on file   Social History Narrative  . No narrative on file    Past Surgical History  Procedure Laterality Date  . Coronary angioplasty with stent placement      stents x4  . Cosmetic surgery      Scheduled Meds: . aspirin  325 mg Oral Daily  . [START ON 06/11/2012] carvedilol  3.125 mg Oral BID WC  . clopidogrel  75 mg Oral Daily  .  lisinopril  10 mg Oral Daily  . [START ON 06/11/2012] omega-3 acid ethyl esters  1 g Oral Daily  . pantoprazole  40 mg Oral Daily  . [START ON 06/11/2012] vitamin B-12  1,000 mcg Oral Daily  . [START ON 06/11/2012] vitamin C  500 mg Oral Daily   Continuous Infusions: . heparin 1,150 Units/hr (06/10/12 1848)   PRN Meds:.LORazepam, oxyCODONE-acetaminophen, zolpidem  Allergy: Penicillin. Intolerance to statins.  Physical Exam: Blood pressure 113/62, pulse 56, resp. rate 16, SpO2 99.00%.  He is a pleasant white male in no acute distress. HEENT: Normocephalic, atraumatic. Pupils equal round and reactive. Extraocular movements are full. Sclera clear. He does wear glasses. Oropharynx is clear. Neck is  supple with a loud right carotid bruit. No JVD, thyromegaly, or adenopathy. Lungs: Clear Cardiovascular: Regular rate and rhythm. Normal S1 and S2. No gallop, murmur, or click. Abdomen: Soft and nontender. No masses or bruits. Extremities: Radial, femoral, and pedal pulses are 2+. No edema or cyanosis. Skin: Warm and dry Neuro: Alert and oriented x3. Cranial nerves II through XII are intact.  Labs:   Lab Results  Component Value Date   WBC 4.9 06/10/2012   HGB 12.7* 06/10/2012   HCT 35.5* 06/10/2012   MCV 89.0 06/10/2012   PLT 197 06/10/2012    Recent Labs Lab 06/10/12 1644  NA 139  K 4.4  CL 105  CO2 28  BUN 19  CREATININE 1.22  CALCIUM 9.1  PROT 6.8  BILITOT 0.3  ALKPHOS 49  ALT 28  AST 25  GLUCOSE 100*   Lab Results  Component Value Date   CKTOTAL 176 01/08/2011   CKMB 4.2* 01/08/2011   TROPONINI <0.30 06/10/2012    Lab Results  Component Value Date   CHOL 167 01/08/2011   CHOL  Value: 200        ATP III CLASSIFICATION:  <200     mg/dL   Desirable  161-096  mg/dL   Borderline High  >=045    mg/dL   High 40/98/1191   CHOL  Value: 149        ATP III CLASSIFICATION:  <200     mg/dL   Desirable  478-295  mg/dL   Borderline High  >=621    mg/dL   High 3/0/8657   Lab Results  Component Value Date   HDL 30* 01/08/2011   HDL 34* 12/18/2007   HDL 31* 06/03/2007   Lab Results  Component Value Date   LDLCALC 77 01/08/2011   LDLCALC  Value: 118        Total Cholesterol/HDL:CHD Risk Coronary Heart Disease Risk Table                     Men   Women  1/2 Average Risk   3.4   3.3* 12/18/2007   LDLCALC  Value: 97        Total Cholesterol/HDL:CHD Risk Coronary Heart Disease Risk Table                     Men   Women  1/2 Average Risk   3.4   3.3 06/03/2007   Lab Results  Component Value Date   TRIG 299* 01/08/2011   TRIG 238* 12/18/2007   TRIG 106 06/03/2007   Lab Results  Component Value Date   CHOLHDL 5.6 01/08/2011   CHOLHDL 5.9 12/18/2007   CHOLHDL 4.8 06/03/2007   No  results found for this basename: LDLDIRECT  Radiology: CHEST - 2 VIEW  Comparison: PA and lateral chest 01/07/2011.  Findings: Lungs are clear. Heart size is normal. Nipple shadows are noted. No pneumothorax or pleural effusion.  IMPRESSION: No acute disease.   Original Report Authenticated By: Holley Dexter, M.D.        EKG: Normal sinus rhythm, left anterior fascicular block, old anterior septal myocardial infarction with Q waves in leads 1, V2 throughV6. This is unchanged compared to ECG last year.  ASSESSMENT AND PLAN:  1. Unstable angina pectoris. Patient has had 2-1/2 hours of chest pain. ECG is chronically abnormal. Initial troponin is negative. We will admit the patient and begin IV heparin. Place topical nitrates. Continue his home medications including aspirin and Plavix. Plan on cardiac catheterization in the morning.  2. Coronary disease with history of myocardial infarction x3. Multiple stent procedures including LAD x2, left circumflex/OM 2, and RCA. Continue aspirin and Plavix.  3. Hypertension  4. Hyperlipidemia with statin intolerance.  SignedTheron Arista [facility] 06/10/2012, 6:49 PM

## 2012-06-10 NOTE — ED Notes (Signed)
Pt has been having chest pain for the past couple of days 1/10.  Pt was walking today and started having increasing chest pain 6/10 with some diaphoresis.  Pt describes pain as more of a discomfort with pressure and tightness.  Pt took 325 ASA and 2 nitros PTA.  Pt also had 3 more nitros with EMS.  Pain now down to 2/10. Nad.  Pt has a cardiac hx.

## 2012-06-10 NOTE — Progress Notes (Signed)
PHARMACIST - PHYSICIAN ORDER COMMUNICATION  CONCERNING: P&T Medication Policy on Herbal Medications  DESCRIPTION:  This patient's order for:  CoQ10 has been noted.  This product(s) is classified as an "herbal" or natural product. Due to a lack of definitive safety studies or FDA approval, nonstandard manufacturing practices, plus the potential risk of unknown drug-drug interactions while on inpatient medications, the Pharmacy and Therapeutics Committee does not permit the use of "herbal" or natural products of this type within [place].   ACTION TAKEN: The pharmacy department is unable to verify this order at this time and your patient has been informed of this safety policy. Please reevaluate patient's clinical condition at discharge and address if the herbal or natural product(s) should be resumed at that time.   Wrangler Penning, PharmD, BCPS  

## 2012-06-10 NOTE — ED Provider Notes (Signed)
History     CSN: [id number]  Arrival date & time 06/10/12  1613   First MD Initiated Contact with Patient 06/10/12 1643      Chief Complaint  Patient presents with  . Chest Pain    (Consider location/radiation/quality/duration/timing/severity/associated sxs/prior treatment) Patient is a 67 y.o. male presenting with chest pain. The history is provided by the patient.  Chest Pain He has been having episodes of chest pressure for the last 2 weeks. They have been getting worse. Symptoms were initially intermittent but have been constant for the last several hours. There is associated dyspnea and mild diaphoresis. He denies nausea. Today, he took 2 nitroglycerin +325 mg of aspirin with partial relief. EMS gave him 2 more nitroglycerin with additional relief. Pain was 6/10 at its worst, and is now down to 2/10. During the last 2 weeks, the pain has not been exertional. He states that he walks up and down 3 flights of steps several times a day without any difficulty. He does have history of having 4 stents placed in the past.  Past Medical History  Diagnosis Date  . Coronary artery disease     Pt with 4 stents over the course of 2003-2012  . Hypercholesterolemia   . Hypertension   . Elevated LFTs   . Myocardial infarction   . Ischemic cardiomyopathy   . Angina   . Arthritis   . GERD (gastroesophageal reflux disease)   . CHF (congestive heart failure)   . Heart murmur   . Shortness of breath   . Anxiety     Past Surgical History  Procedure Laterality Date  . Coronary angioplasty with stent placement      stents x4  . Cosmetic surgery      Family History  Problem Relation Age of Onset  . Coronary artery disease Mother   . Coronary artery disease Father     History  Substance Use Topics  . Smoking status: Former Smoker -- 0.05 packs/day for 15 years    Types: Cigarettes  . Smokeless tobacco: Never Used  . Alcohol Use: 0.6 oz/week    1 Glasses of wine per week   Comment: 1 glass of wine per week      Review of Systems  Cardiovascular: Positive for chest pain.  All other systems reviewed and are negative.    Allergies  Penicillins and Statins  Home Medications   Current Outpatient Rx  Name  Route  Sig  Dispense  Refill  . aspirin 325 MG EC tablet   Oral   Take 325 mg by mouth daily.          . carvedilol (COREG) 3.125 MG tablet   Oral   Take 1 tablet (3.125 mg total) by mouth 2 (two) times daily with a meal.   60 tablet   11   . clopidogrel (PLAVIX) 75 MG tablet   Oral   Take 1 tablet (75 mg total) by mouth daily.   30 tablet   11   . Coenzyme Q10 (CO Q10) 100 MG TABS   Oral   Take 1 tablet by mouth daily.           . fish oil-omega-3 fatty acids 1000 MG capsule   Oral   Take 1 g by mouth daily.         Marland Kitchen lisinopril (PRINIVIL,ZESTRIL) 10 MG tablet   Oral   Take 1 tablet (10 mg total) by mouth daily.   30 tablet   11   .  LORazepam (ATIVAN) 0.5 MG tablet   Oral   Take 0.5 mg by mouth every 8 (eight) hours as needed. For anxiety         . nitroGLYCERIN (NITROSTAT) 0.4 MG SL tablet   Sublingual   Place 0.4 mg under the tongue every 5 (five) minutes x 3 doses as needed. For chest pain.         Marland Kitchen oxyCODONE-acetaminophen (PERCOCET/ROXICET) 5-325 MG per tablet   Oral   Take 1-2 tablets by mouth every 6 (six) hours as needed for pain.   10 tablet   0   . pantoprazole (PROTONIX) 40 MG tablet   Oral   Take 1 tablet (40 mg total) by mouth daily.   30 tablet   11   . vitamin B-12 (CYANOCOBALAMIN) 1000 MCG tablet   Oral   Take 1,000 mcg by mouth daily.         . vitamin C (ASCORBIC ACID) 500 MG tablet   Oral   Take 500 mg by mouth daily.          Marland Kitchen zolpidem (AMBIEN) 10 MG tablet   Oral   Take 10 mg by mouth at bedtime as needed. For sleep           BP 116/73  Pulse 82  Resp 15  SpO2 99%  Physical Exam  Nursing note and vitals reviewed.  67 year old male, resting comfortably and in no  acute distress. Vital signs are normal. Oxygen saturation is 99%, which is normal. Head is normocephalic and atraumatic. PERRLA, EOMI. Oropharynx is clear. Neck is nontender and supple without adenopathy or JVD. Back is nontender and there is no CVA tenderness. Lungs are clear without rales, wheezes, or rhonchi. Chest is nontender. Heart has regular rate and rhythm without murmur. Abdomen is soft, flat, nontender without masses or hepatosplenomegaly and peristalsis is normoactive. Extremities have no cyanosis or edema, full range of motion is present. Skin is warm and dry without rash. Neurologic: Mental status is normal, cranial nerves are intact, there are no motor or sensory deficits.  ED Course  Procedures (including critical care time)  Results for orders placed during the hospital encounter of 06/10/12  CBC WITH DIFFERENTIAL      Result Value Range   WBC 4.9  4.0 - 10.5 K/uL   RBC 3.99 (*) 4.22 - 5.81 MIL/uL   Hemoglobin 12.7 (*) 13.0 - 17.0 g/dL   HCT 82.9 (*) 56.2 - 13.0 %   MCV 89.0  78.0 - 100.0 fL   MCH 31.8  26.0 - 34.0 pg   MCHC 35.8  30.0 - 36.0 g/dL   RDW 86.5  78.4 - 69.6 %   Platelets 197  150 - 400 K/uL   Neutrophils Relative 53  43 - 77 %   Neutro Abs 2.6  1.7 - 7.7 K/uL   Lymphocytes Relative 36  12 - 46 %   Lymphs Abs 1.8  0.7 - 4.0 K/uL   Monocytes Relative 8  3 - 12 %   Monocytes Absolute 0.4  0.1 - 1.0 K/uL   Eosinophils Relative 3  0 - 5 %   Eosinophils Absolute 0.2  0.0 - 0.7 K/uL   Basophils Relative 0  0 - 1 %   Basophils Absolute 0.0  0.0 - 0.1 K/uL  COMPREHENSIVE METABOLIC PANEL      Result Value Range   Sodium 139  135 - 145 mEq/L   Potassium 4.4  3.5 - 5.1 mEq/L  Chloride 105  96 - 112 mEq/L   CO2 28  19 - 32 mEq/L   Glucose, Bld 100 (*) 70 - 99 mg/dL   BUN 19  6 - 23 mg/dL   Creatinine, Ser 9.60  0.50 - 1.35 mg/dL   Calcium 9.1  8.4 - 45.4 mg/dL   Total Protein 6.8  6.0 - 8.3 g/dL   Albumin 3.6  3.5 - 5.2 g/dL   AST 25  0 - 37 U/L    ALT 28  0 - 53 U/L   Alkaline Phosphatase 49  39 - 117 U/L   Total Bilirubin 0.3  0.3 - 1.2 mg/dL   GFR calc non Af Amer 60 (*) >90 mL/min   GFR calc Af Amer 70 (*) >90 mL/min  TROPONIN I      Result Value Range   Troponin I <0.30  <0.30 ng/mL   Dg Chest 2 View  06/10/2012  *RADIOLOGY REPORT*  Clinical Data: Chest pain and intermittent vertigo.  CHEST - 2 VIEW  Comparison: PA and lateral chest 01/07/2011.  Findings: Lungs are clear.  Heart size is normal. Nipple shadows are noted.  No pneumothorax or pleural effusion.  IMPRESSION: No acute disease.   Original Report Authenticated By: Holley Dexter, M.D.      Date: 06/10/2012  Rate: 58  Rhythm: sinus bradycardia  QRS Axis: left  Intervals: normal  ST/T Wave abnormalities: ST elevation in leads V2, V3, V4, V5  Conduction Disutrbances:left anterior fascicular block  Narrative Interpretation: Left anterior fascicular block, old anterolateral myocardial infarction with residual ST elevation. When compared with the ECG of 01/07/2011, no significant changes are seen.  Old EKG Reviewed: unchanged    1. Unstable angina   2. CAD S/P percutaneous coronary angioplasty   3. Hyperlipidemia LDL goal < 100   4. Hypertension   5. Ischemic cardiomyopathy    CRITICAL CARE Performed by: [email or web address]   Total critical care time: 35 minutes  Critical care time was exclusive of separately billable procedures and treating other patients.  Critical care was necessary to treat or prevent imminent or life-threatening deterioration.  Critical care was time spent personally by me on the following activities: development of treatment plan with patient and/or surrogate as well as nursing, discussions with consultants, evaluation of patient's response to treatment, examination of patient, obtaining history from patient or surrogate, ordering and performing treatments and interventions, ordering and review of laboratory studies, ordering and review of  radiographic studies, pulse oximetry and re-evaluation of patient's condition.    MDM  Chest pain in a pattern worrisome for unstable angina in a patient with known coronary artery disease. Old records have been reviewed and and in the past, catheterizations have shown patent stents without significant disease outside of the stented areas. He is placed on topical nitroglycerin and will be given morphine for pain and is started on heparin. Case has been discussed with Dr. Patty Sermons of [facility] who will come and admit the patient.        Dione Booze, MD 06/10/12 2012

## 2012-06-10 NOTE — Progress Notes (Signed)
ANTICOAGULATION CONSULT NOTE - Initial Consult  Pharmacy Consult for heparin Indication: chest pain/ACS  Allergies  Allergen Reactions  . Penicillins Anaphylaxis  . Statins     Pt tolerates lovastatin as a home medication without difficulties.    Patient Measurements: Wt= 84kg Ht= 5' 7'' IBW= 66kg Heparin dosing weight= 83kg  Vital Signs: BP: 116/73 mmHg (04/17 1628) Pulse Rate: 82 (04/17 1628)  Labs:  Recent Labs  06/10/12 1644  HGB 12.7*  HCT 35.5*  PLT 197  CREATININE 1.22  TROPONINI <0.30    The CrCl is unknown because both a height and weight (above a minimum accepted value) are required for this calculation.   Medical History: Past Medical History  Diagnosis Date  . Coronary artery disease     Pt with 4 stents over the course of 2003-2012  . Hypercholesterolemia   . Hypertension   . Elevated LFTs   . Myocardial infarction   . Ischemic cardiomyopathy   . Angina   . Arthritis   . GERD (gastroesophageal reflux disease)   . CHF (congestive heart failure)   . Heart murmur   . Shortness of breath   . Anxiety      Assessment: 67 yo male here with CP to start heparin.  Goal of Therapy:  Heparin level 0.3-0.7 units/ml Monitor platelets by anticoagulation protocol: Yes   Plan:  -Heparin bolus 4000 units IV followed by 1150 units/hr (~14 units/kg/hr) -Heparin level in 6 hours and daily wth CBC daily

## 2012-06-11 ENCOUNTER — Encounter (HOSPITAL_COMMUNITY)
Admission: EM | Disposition: A | Discharge status: Home / Self Care | Payer: Self-pay | Source: Home / Self Care | Attending: Cardiology

## 2012-06-11 ENCOUNTER — Encounter (HOSPITAL_COMMUNITY): Payer: Self-pay | Admitting: *Deleted

## 2012-06-11 DIAGNOSIS — I2510 Atherosclerotic heart disease of native coronary artery without angina pectoris: Secondary | ICD-10-CM

## 2012-06-11 DIAGNOSIS — I200 Unstable angina: Secondary | ICD-10-CM | POA: Diagnosis present

## 2012-06-11 HISTORY — PX: CARDIAC CATHETERIZATION: SHX172

## 2012-06-11 HISTORY — PX: LEFT HEART CATHETERIZATION WITH CORONARY ANGIOGRAM: SHX5451

## 2012-06-11 LAB — LIPID PANEL
Cholesterol: 180 mg/dL (ref 0–200)
LDL Cholesterol: 73 mg/dL (ref 0–99)
Total CHOL/HDL Ratio: 5.3 RATIO
Triglycerides: 367 mg/dL — ABNORMAL HIGH (ref <150)
VLDL: 73 mg/dL — ABNORMAL HIGH (ref 0–40)

## 2012-06-11 LAB — TROPONIN I: Troponin I: <0.3 ng/mL (ref <0.30)

## 2012-06-11 LAB — COMPREHENSIVE METABOLIC PANEL
ALT: 28 U/L (ref 0–53)
AST: 23 U/L (ref 0–37)
Alkaline Phosphatase: 56 U/L (ref 39–117)
CO2: 30 mEq/L (ref 19–32)
Chloride: 102 mEq/L (ref 96–112)
GFR calc Af Amer: 76 mL/min — ABNORMAL LOW (ref >90)
GFR calc non Af Amer: 65 mL/min — ABNORMAL LOW (ref >90)
Glucose, Bld: 104 mg/dL — ABNORMAL HIGH (ref 70–99)
Potassium: 3.6 mEq/L (ref 3.5–5.1)
Sodium: 139 mEq/L (ref 135–145)
Total Bilirubin: 0.3 mg/dL (ref 0.3–1.2)

## 2012-06-11 LAB — CBC
HCT: 39.9 % (ref 39.0–52.0)
MCH: 32.1 pg (ref 26.0–34.0)
MCV: 90.9 fL (ref 78.0–100.0)
Platelets: 213 10*3/uL (ref 150–400)

## 2012-06-11 LAB — PROTIME-INR: INR: 1.23 (ref 0.00–1.49)

## 2012-06-11 LAB — HEPARIN LEVEL (UNFRACTIONATED): Heparin Unfractionated: 0.29 IU/mL — ABNORMAL LOW (ref 0.30–0.70)

## 2012-06-11 SURGERY — LEFT HEART CATHETERIZATION WITH CORONARY ANGIOGRAM
Anesthesia: LOCAL

## 2012-06-11 MED ORDER — HEPARIN (PORCINE) IN NACL 2-0.9 UNIT/ML-% IJ SOLN
INTRAMUSCULAR | Status: AC
  Filled 2012-06-11: qty 1000

## 2012-06-11 MED ORDER — OMEGA-3-ACID ETHYL ESTERS 1 G PO CAPS: 1.0000 g | ORAL_CAPSULE | Freq: Every day | ORAL | Status: DC

## 2012-06-11 MED ORDER — SODIUM CHLORIDE 0.9 % IV SOLN: INTRAVENOUS | Status: DC

## 2012-06-11 MED ORDER — FENTANYL CITRATE 0.05 MG/ML IJ SOLN
INTRAMUSCULAR | Status: AC
  Filled 2012-06-11: qty 2

## 2012-06-11 MED ORDER — CLOPIDOGREL BISULFATE 75 MG PO TABS: 75.0000 mg | ORAL_TABLET | Freq: Every day | ORAL | Status: AC

## 2012-06-11 MED ORDER — LIDOCAINE HCL (PF) 1 % IJ SOLN
INTRAMUSCULAR | Status: AC
  Filled 2012-06-11: qty 30

## 2012-06-11 MED ORDER — VERAPAMIL HCL 2.5 MG/ML IV SOLN
INTRAVENOUS | Status: AC
  Filled 2012-06-11: qty 2

## 2012-06-11 MED ORDER — LISINOPRIL 10 MG PO TABS: 10.0000 mg | ORAL_TABLET | Freq: Every day | ORAL | Status: DC

## 2012-06-11 MED ORDER — NITROGLYCERIN 0.4 MG SL SUBL: 0.4000 mg | SUBLINGUAL_TABLET | SUBLINGUAL | Status: DC | PRN

## 2012-06-11 MED ORDER — CARVEDILOL 3.125 MG PO TABS: 3.1250 mg | ORAL_TABLET | Freq: Two times a day (BID) | ORAL | Status: DC

## 2012-06-11 MED ORDER — MIDAZOLAM HCL 2 MG/2ML IJ SOLN
INTRAMUSCULAR | Status: AC
  Filled 2012-06-11: qty 2

## 2012-06-11 MED ORDER — HEPARIN SODIUM (PORCINE) 1000 UNIT/ML IJ SOLN
INTRAMUSCULAR | Status: AC
  Filled 2012-06-11: qty 1

## 2012-06-11 NOTE — H&P (View-Only) (Signed)
   TELEMETRY: Reviewed telemetry pt in NSR: Filed Vitals:   06/10/12 1953 06/10/12 2015 06/10/12 2048 06/11/12 0300  BP: 106/63 117/68 116/68 113/63  Pulse: 71 75 73 50  Temp:   97.8 F (36.6 C) 97.1 F (36.2 C)  Resp:  18 20 20   Height:   5\' 7"  (1.702 m)   Weight:   189 lb (85.73 kg)   SpO2: 97% 97% 97% 99%   No intake or output data in the 24 hours ending 06/11/12 0853  SUBJECTIVE Still has a little chest pressure but significantly improved.  LABS: Basic Metabolic Panel:  Recent Labs  16/10/96 1644 06/11/12 0234  NA 139 139  K 4.4 3.6  CL 105 102  CO2 28 30  GLUCOSE 100* 104*  BUN 19 16  CREATININE 1.22 1.14  CALCIUM 9.1 9.2   Liver Function Tests:  Recent Labs  06/10/12 1644 06/11/12 0234  AST 25 23  ALT 28 28  ALKPHOS 49 56  BILITOT 0.3 0.3  PROT 6.8 7.2  ALBUMIN 3.6 3.7   No results found for this basename: LIPASE, AMYLASE,  in the last 72 hours CBC:  Recent Labs  06/10/12 1644 06/11/12 0234  WBC 4.9 6.9  NEUTROABS 2.6  --   HGB 12.7* 14.1  HCT 35.5* 39.9  MCV 89.0 90.9  PLT 197 213   Cardiac Enzymes:  Recent Labs  06/10/12 1644 06/10/12 2113 06/11/12 0234  TROPONINI <0.30 <0.30 <0.30   Fasting Lipid Panel:  Recent Labs  06/11/12 0234  CHOL 180  HDL 34*  LDLCALC 73  TRIG 045*  CHOLHDL 5.3   Radiology/Studies:  Dg Chest 2 View  06/10/2012  *RADIOLOGY REPORT*  Clinical Data: Chest pain and intermittent vertigo.  CHEST - 2 VIEW  Comparison: PA and lateral chest 01/07/2011.  Findings: Lungs are clear.  Heart size is normal. Nipple shadows are noted.  No pneumothorax or pleural effusion.  IMPRESSION: No acute disease.   Original Report Authenticated By: Holley Dexter, M.D.     PHYSICAL EXAM General: Well developed, well nourished, in no acute distress. Head: Normocephalic, atraumatic, sclera non-icteric, no xanthomas, nares are without discharge. Neck: Positive right carotid bruit. JVD not elevated. Lungs: Clear bilaterally  to auscultation without wheezes, rales, or rhonchi. Breathing is unlabored. Heart: RRR S1 S2 without murmurs, rubs, or gallops.  Abdomen: Soft, non-tender, non-distended with normoactive bowel sounds. No hepatomegaly. No rebound/guarding. No obvious abdominal masses. Msk:  Strength and tone appears normal for age. Extremities: No clubbing, cyanosis or edema.  Distal pedal pulses are 2+ and equal bilaterally. Neuro: Alert and oriented X 3. Moves all extremities spontaneously. Psych:  Responds to questions appropriately with a normal affect.  ASSESSMENT AND PLAN: 1. Unstable angina- on IV heparin. Proceed with cardiac cath/ PCI today. 2. CAD s/p MIs, multiple stents. On ASA and Plavix.  3. HTN controlled. 4. Hyperlipidemia statin intolerant.   Principal Problem:   Unstable angina Active Problems:   CAD S/P percutaneous coronary angioplasty   Hyperlipidemia LDL goal < 100   Hypertension   Ischemic cardiomyopathy   Previous myocardial infarction older than 8 weeks    Signed, Peter [place] MD,FACC 06/11/2012 8:53 AM

## 2012-06-11 NOTE — Discharge Summary (Signed)
Patient seen and examined and history reviewed. Agree with above findings and plan. Please note cath results.  [name] [facility] 06/11/2012 1:45 PM

## 2012-06-11 NOTE — CV Procedure (Signed)
   Cardiac Catheterization Procedure Note  Name: [name] MRN: [id number] DOB: 02-24-1946  Procedure: Left Heart Cath, Selective Coronary Angiography, LV angiography  Indication: 67 year-old white male with extensive history of coronary disease. He's had multiple myocardial infarctions in the past and multiple stent procedures. He presents with prolonged symptoms of chest pressure   Procedural Details: The right wrist was prepped, draped, and anesthetized with 1% lidocaine. Using the modified Seldinger technique, a 5 French sheath was introduced into the right radial artery. 3 mg of verapamil was administered through the sheath, weight-based unfractionated heparin was administered intravenously. Standard Judkins catheters were used for selective coronary angiography and left ventriculography. Catheter exchanges were performed over an exchange length guidewire. There were no immediate procedural complications. A TR band was used for radial hemostasis at the completion of the procedure.  The patient was transferred to the post catheterization recovery area for further monitoring.  Procedural Findings: Hemodynamics: AO 116/61 with a mean of 86 mmHg LV 120/23 mmHg  Coronary angiography: Coronary dominance: right  Left mainstem: Normal.  Left anterior descending (LAD): There is extensive stenting of the proximal LAD. The stents are widely patent. Distal to the second stent there is 30-40% disease in the mid LAD. The first diagonal is without significant disease.  Left circumflex (LCx): The left circumflex gives rise to 2 large marginal branches. There is 30% disease in the proximal left circumflex. The mid vessel has a stent that is widely patent. The first marginal branch has 20-30% disease proximally.  Right coronary artery (RCA): The right coronary is a large dominant vessel. There is a stent in the proximal vessel which is widely patent. It has less than 20% disease. The mid and distal  right coronary are are irregular with disease up to 20%.  Left ventriculography: Left ventricular systolic function is mildly reduced. There is an apical dyskinetic area. Overall ejection fraction is estimated at 50%. there is no significant mitral regurgitation   Final Conclusions:   1. Nonobstructive coronary disease. 2. Mild LV dysfunction with apical dyskinesis.  Recommendations: Continue medical management.  [name] [facility] 06/11/2012, 11:14 AM

## 2012-06-11 NOTE — Interval H&P Note (Signed)
History and Physical Interval Note:  06/11/2012 10:33 AM  [name]  has presented today for surgery, with the diagnosis of cp  The various methods of treatment have been discussed with the patient and family. After consideration of risks, benefits and other options for treatment, the patient has consented to  Procedure(s): LEFT HEART CATHETERIZATION WITH CORONARY ANGIOGRAM (N/A) as a surgical intervention .  The patient's history has been reviewed, patient examined, no change in status, stable for surgery.  I have reviewed the patient's chart and labs.  Questions were answered to the patient's satisfaction.     [name] [facility] 06/11/2012 10:33 AM

## 2012-06-11 NOTE — Progress Notes (Signed)
ANTICOAGULATION CONSULT NOTE  Pharmacy Consult for heparin Indication: chest pain/ACS  Allergies  Allergen Reactions  . Penicillins Anaphylaxis  . Statins     Pt tolerates lovastatin as a home medication without difficulties.    Patient Measurements: Wt= 84kg Ht= 5' 7'' IBW= 66kg Heparin dosing weight= 83kg  Vital Signs: Temp: 97.8 F (36.6 C) (04/17 2048) BP: 116/68 mmHg (04/17 2048) Pulse Rate: 73 (04/17 2048)  Labs:  Recent Labs  06/10/12 1644 06/10/12 2112 06/10/12 2113 06/11/12 0234  HGB 12.7*  --   --  14.1  HCT 35.5*  --   --  39.9  PLT 197  --   --  213  APTT  --  91*  --   --   LABPROT  --  15.4*  --  15.3*  INR  --  1.24  --  1.23  HEPARINUNFRC  --   --   --  0.29*  CREATININE 1.22  --   --  1.14  TROPONINI <0.30  --  <0.30 <0.30    Estimated Creatinine Clearance: 66.6 ml/min (by C-G formula based on Cr of 1.14).  Assessment: 67 yo male with chest pain for heparin  Goal of Therapy:  Heparin level 0.3-0.7 units/ml Monitor platelets by anticoagulation protocol: Yes   Plan:  Increase Heparin 1250 units/hr F/U after cath  Geannie Risen, PharmD, BCPS

## 2012-06-11 NOTE — Progress Notes (Signed)
Utilization Review Completed Bryson Palen J. Eisa Conaway, RN, BSN, NCM [phone]  

## 2012-06-11 NOTE — Progress Notes (Signed)
TR BAND REMOVAL  LOCATION:  right radial  DEFLATED PER PROTOCOL:  yes  TIME BAND OFF / DRESSING APPLIED:   1430   SITE UPON ARRIVAL:   Level 0  SITE AFTER BAND REMOVAL:  Level 0  REVERSE ALLEN'S TEST:    positive  CIRCULATION SENSATION AND MOVEMENT:  Within Normal Limits  yes  COMMENTS:  Ambulated hall denies pain tolerated well ready to discharge home post instructions given family at bedside.

## 2012-06-11 NOTE — Discharge Summary (Signed)
Discharge Summary   Patient ID: [name],  MRN: [id number], DOB/AGE: 1945/06/13 67 y.o.  Admit date: 06/10/2012 Discharge date: 06/11/2012  Primary Physician: Pcp Not In System Primary Cardiologist: P. Eden Emms, MD  Discharge Diagnoses Principal Problem:   Unstable angina Active Problems:   CAD S/P percutaneous coronary angioplasty   Hyperlipidemia LDL goal < 100   Hypertension   Ischemic cardiomyopathy   Previous myocardial infarction older than 8 weeks   Allergies Allergies  Allergen Reactions  . Penicillins Anaphylaxis  . Statins     Pt tolerates lovastatin as a home medication without difficulties.    Diagnostic Studies/Procedures  PA/LATERAL CHEST X-RAY - 06/10/12  IMPRESSION:  No acute disease.  CARDIAC CATHETERIZATION - 06/11/12  Hemodynamics:  AO 116/61 with a mean of 86 mmHg  LV 120/23 mmHg  Coronary angiography:  Coronary dominance: right  Left mainstem: Normal.  Left anterior descending (LAD): There is extensive stenting of the proximal LAD. The stents are widely patent. Distal to the second stent there is 30-40% disease in the mid LAD. The first diagonal is without significant disease.  Left circumflex (LCx): The left circumflex gives rise to 2 large marginal branches. There is 30% disease in the proximal left circumflex. The mid vessel has a stent that is widely patent. The first marginal branch has 20-30% disease proximally.  Right coronary artery (RCA): The right coronary is a large dominant vessel. There is a stent in the proximal vessel which is widely patent. It has less than 20% disease. The mid and distal right coronary are are irregular with disease up to 20%.  Left ventriculography: Left ventricular systolic function is mildly reduced. There is an apical dyskinetic area. Overall ejection fraction is estimated at 50%. there is no significant mitral regurgitation   Final Conclusions:  1. Nonobstructive coronary disease.  2. Mild LV dysfunction  with apical dyskinesis.  History of Present Illness [name] is a 67 y.o. male who was hospitalized at [facility] yesterday with the above problem list.   He has previously known complex coronary artery disease and resultant ischemic cardiomyopathy. He suffered his first MI in 2003 status post stent-LAD while in [place]. In 2009, he had a recurrent anterior STEMI with repeat stent-LAD ([name]) and stents to the left circumflex and OM 2 ([name]). 11/2007, a repeat cardiac catheterization demonstrated patent stents. 08/2010 he suffered an inferior MI again while in [place] status post-RCA. This was apparently complicated by mural thrombus. MRI performed this past year revealed no evidence of mural thrombus and EF 48%.   He had been in his usual state of health until approximately 2 weeks ago when he began endorsing increased chest pressure on exertion. The date of admission, he was walking in the park with his wife when he experienced a more severe episode of chest pressure radiating into his neck and left arm rated at a 7/10. He took 2 sublingual nitroglycerin without relief. EMS was called and he received a third sublingual nitroglycerin however the chest pain persisted. This lasted for a total of 2 and half hours. He reports his discomfort was similar to his prior MIs.  In the ED, EKG revealed chronic changes consistent with prior MIs in no acute evidence of ischemia. Initial troponin I returned within normal limits. Chest x-ray as above demonstrated no acute disease. Given his extensive cardiac history and history of present illness concerning for unstable angina pectoris, the decision was made to observe the patient overnight and plan for repeat diagnostic cardiac  catheterization in the morning.  Hospital Course   He was continued on all outpatient medications including aspirin, Plavix, ACE, beta blocker. He is a known history of significant statin intolerance. Topical nitroglycerin was  applied. He remained stable overnight. Three subsequent readings of troponin-I returned WNL. A lipid panel was ordered revealing LDL 73, HDL 34, triglycerides 367, total cholesterol 180.  This morning, he was informed, consented and prepped for cardiac catheterization which was accessed via the right radial artery. As above, this revealed nonobstructive CAD, EF 50% and mild apical dyskinesis. The patient tolerated procedure well without palpitations. Recommendation was made to continue medical management. He was deemed stable for discharge by [name] performed the cardiac catheterization. He'll resume all outpatient medications. He will continue Lovaza which was initiated this admission. He will followup in the office as noted below. This information, including post cath instructions and activity restrictions, has been clearly outlined in the discharge AVS.  Discharge Vitals:  Blood pressure 113/63, pulse 50, temperature 97.1 F (36.2 C), resp. rate 20, height 5\' 7"  (1.702 m), weight 84.5 kg (186 lb 4.6 oz), SpO2 99.00%.   Labs: Recent Labs     06/10/12  1644  06/11/12  0234  WBC  4.9  6.9  HGB  12.7*  14.1  HCT  35.5*  39.9  MCV  89.0  90.9  PLT  197  213   Recent Labs Lab 06/10/12 1644 06/11/12 0234  NA 139 139  K 4.4 3.6  CL 105 102  CO2 28 30  BUN 19 16  CREATININE 1.22 1.14  CALCIUM 9.1 9.2  PROT 6.8 7.2  BILITOT 0.3 0.3  ALKPHOS 49 56  ALT 28 28  AST 25 23  GLUCOSE 100* 104*   Recent Labs     06/10/12  2113  06/11/12  0234  06/11/12  0920  TROPONINI  <0.30  <0.30  <0.30   Recent Labs     06/11/12  0234  CHOL  180  HDL  34*  LDLCALC  73  TRIG  367*  CHOLHDL  5.3   Disposition:  Discharge Orders   Future Appointments Provider Department Dept Phone   07/06/2012 9:30 AM [name] Stade, MD [place] [facility] Main Office [place]) [phone]   Future Orders Complete By Expires     Diet - low sodium heart healthy  As directed     Increase activity slowly   As directed       Follow-up Information   Follow up with [name] Haws, MD On 07/06/2012. (At 9:30 AM for follow-up.)    Contact information:   [address] [place]lyn [place] [address] [phone]      Discharge Medications:    Medication List    TAKE these medications       aspirin 325 MG EC tablet  Take 325 mg by mouth daily.     carvedilol 3.125 MG tablet  Commonly known as:  COREG  Take 1 tablet (3.125 mg total) by mouth 2 (two) times daily with a meal.     clopidogrel 75 MG tablet  Commonly known as:  PLAVIX  Take 1 tablet (75 mg total) by mouth daily.     Co Q10 100 MG Tabs  Take 1 tablet by mouth daily.     fish oil-omega-3 fatty acids 1000 MG capsule  Take 1 g by mouth daily.     lisinopril 10 MG tablet  Commonly known as:  PRINIVIL,ZESTRIL  Take  1 tablet (10 mg total) by mouth daily.     LORazepam 0.5 MG tablet  Commonly known as:  ATIVAN  Take 0.5 mg by mouth every 8 (eight) hours as needed. For anxiety     nitroGLYCERIN 0.4 MG SL tablet  Commonly known as:  NITROSTAT  Place 0.4 mg under the tongue every 5 (five) minutes x 3 doses as needed. For chest pain.     omega-3 acid ethyl esters 1 G capsule  Commonly known as:  LOVAZA  Take 1 capsule (1 g total) by mouth daily.     oxyCODONE-acetaminophen 5-325 MG per tablet  Commonly known as:  PERCOCET/ROXICET  Take 1-2 tablets by mouth every 6 (six) hours as needed for pain.     pantoprazole 40 MG tablet  Commonly known as:  PROTONIX  Take 1 tablet (40 mg total) by mouth daily.     vitamin B-12 1000 MCG tablet  Commonly known as:  CYANOCOBALAMIN  Take 1,000 mcg by mouth daily.     vitamin C 500 MG tablet  Commonly known as:  ASCORBIC ACID  Take 500 mg by mouth daily.     zolpidem 10 MG tablet  Commonly known as:  AMBIEN  Take 10 mg by mouth at bedtime as needed. For sleep       Outstanding Labs/Studies: None  Duration of Discharge Encounter: Greater than  30 minutes including physician time.  Signed, R. Hurman Horn, PA-C 06/11/2012, 12:43 PM

## 2012-06-11 NOTE — Progress Notes (Signed)
   TELEMETRY: Reviewed telemetry pt in NSR: Filed Vitals:   06/10/12 1953 06/10/12 2015 06/10/12 2048 06/11/12 0300  BP: 106/63 117/68 116/68 113/63  Pulse: 71 75 73 50  Temp:   97.8 F (36.6 C) 97.1 F (36.2 C)  Resp:  18 20 20  Height:   5' 7" (1.702 m)   Weight:   189 lb (85.73 kg)   SpO2: 97% 97% 97% 99%   No intake or output data in the 24 hours ending 06/11/12 0853  SUBJECTIVE Still has a little chest pressure but significantly improved.  LABS: Basic Metabolic Panel:  Recent Labs  06/10/12 1644 06/11/12 0234  NA 139 139  K 4.4 3.6  CL 105 102  CO2 28 30  GLUCOSE 100* 104*  BUN 19 16  CREATININE 1.22 1.14  CALCIUM 9.1 9.2   Liver Function Tests:  Recent Labs  06/10/12 1644 06/11/12 0234  AST 25 23  ALT 28 28  ALKPHOS 49 56  BILITOT 0.3 0.3  PROT 6.8 7.2  ALBUMIN 3.6 3.7   No results found for this basename: LIPASE, AMYLASE,  in the last 72 hours CBC:  Recent Labs  06/10/12 1644 06/11/12 0234  WBC 4.9 6.9  NEUTROABS 2.6  --   HGB 12.7* 14.1  HCT 35.5* 39.9  MCV 89.0 90.9  PLT 197 213   Cardiac Enzymes:  Recent Labs  06/10/12 1644 06/10/12 2113 06/11/12 0234  TROPONINI <0.30 <0.30 <0.30   Fasting Lipid Panel:  Recent Labs  06/11/12 0234  CHOL 180  HDL 34*  LDLCALC 73  TRIG 367*  CHOLHDL 5.3   Radiology/Studies:  Dg Chest 2 View  06/10/2012  *RADIOLOGY REPORT*  Clinical Data: Chest pain and intermittent vertigo.  CHEST - 2 VIEW  Comparison: PA and lateral chest 01/07/2011.  Findings: Lungs are clear.  Heart size is normal. Nipple shadows are noted.  No pneumothorax or pleural effusion.  IMPRESSION: No acute disease.   Original Report Authenticated By: Thomas D'Alessio, M.D.     PHYSICAL EXAM General: Well developed, well nourished, in no acute distress. Head: Normocephalic, atraumatic, sclera non-icteric, no xanthomas, nares are without discharge. Neck: Positive right carotid bruit. JVD not elevated. Lungs: Clear bilaterally  to auscultation without wheezes, rales, or rhonchi. Breathing is unlabored. Heart: RRR S1 S2 without murmurs, rubs, or gallops.  Abdomen: Soft, non-tender, non-distended with normoactive bowel sounds. No hepatomegaly. No rebound/guarding. No obvious abdominal masses. Msk:  Strength and tone appears normal for age. Extremities: No clubbing, cyanosis or edema.  Distal pedal pulses are 2+ and equal bilaterally. Neuro: Alert and oriented X 3. Moves all extremities spontaneously. Psych:  Responds to questions appropriately with a normal affect.  ASSESSMENT AND PLAN: 1. Unstable angina- on IV heparin. Proceed with cardiac cath/ PCI today. 2. CAD s/p MIs, multiple stents. On ASA and Plavix.  3. HTN controlled. 4. Hyperlipidemia statin intolerant.   Principal Problem:   Unstable angina Active Problems:   CAD S/P percutaneous coronary angioplasty   Hyperlipidemia LDL goal < 100   Hypertension   Ischemic cardiomyopathy   Previous myocardial infarction older than 8 weeks    Signed, Peter Jordan MD,FACC 06/11/2012 8:53 AM    

## 2012-06-15 ENCOUNTER — Telehealth: Payer: Self-pay

## 2012-06-15 NOTE — Telephone Encounter (Signed)
TCM call, no answer.LMTC.

## 2012-06-16 ENCOUNTER — Telehealth: Payer: Self-pay | Admitting: Cardiology

## 2012-06-16 NOTE — Telephone Encounter (Signed)
TCM call: Pt states that he has been doing well since his hosp d/c and that he has all of his medications. Pt states that he is aware of his eph f/u with Dr. Eden Emms on 07/06/12. Pt given office phone number to call if he has any questions or concerns, he verbalized understanding.

## 2012-06-16 NOTE — Telephone Encounter (Signed)
New problem   Pt returning Cheryl's call.

## 2012-07-06 ENCOUNTER — Encounter: Payer: Self-pay | Admitting: Cardiovascular Disease

## 2012-07-06 ENCOUNTER — Telehealth: Payer: Self-pay | Admitting: *Deleted

## 2012-07-06 ENCOUNTER — Ambulatory Visit (INDEPENDENT_AMBULATORY_CARE_PROVIDER_SITE_OTHER): Payer: Medicare Other | Admitting: Cardiovascular Disease

## 2012-07-06 VITALS — BP 136/81 | HR 71 | Wt 187.0 lb

## 2012-07-06 DIAGNOSIS — E785 Hyperlipidemia, unspecified: Secondary | ICD-10-CM

## 2012-07-06 DIAGNOSIS — I255 Ischemic cardiomyopathy: Secondary | ICD-10-CM

## 2012-07-06 DIAGNOSIS — I2589 Other forms of chronic ischemic heart disease: Secondary | ICD-10-CM

## 2012-07-06 DIAGNOSIS — I10 Essential (primary) hypertension: Secondary | ICD-10-CM

## 2012-07-06 DIAGNOSIS — I2510 Atherosclerotic heart disease of native coronary artery without angina pectoris: Secondary | ICD-10-CM

## 2012-07-06 DIAGNOSIS — Z9861 Coronary angioplasty status: Secondary | ICD-10-CM

## 2012-07-06 MED ORDER — LORAZEPAM 0.5 MG PO TABS: 0.5000 mg | ORAL_TABLET | Freq: Three times a day (TID) | ORAL | Status: DC | PRN

## 2012-07-06 MED ORDER — ZOLPIDEM TARTRATE 10 MG PO TABS: 10.0000 mg | ORAL_TABLET | Freq: Every evening | ORAL | Status: AC | PRN

## 2012-07-06 NOTE — Assessment & Plan Note (Signed)
Cholesterol is at goal.  Continue current dose of statin and diet Rx.  No myalgias or side effects.  F/U  LFT's in 6 months. Lab Results  Component Value Date   LDLCALC 73 06/11/2012             

## 2012-07-06 NOTE — Progress Notes (Signed)
Patient ID: [name], male   DOB: 1945/10/17, 67 y.o.   MRN: [id number] 67 yo previously followed by Dr Allyson Sabal. 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 Last procedure in [place] 2009. Spends half the year in [facility] and half here. Recently seen in ER for funny feeling in arms that radiated to head and "presyncope" Has had episodes like this every 4-5 months for the last 3 years No documented neuro deficits or arrythmia. Complaint of feeling a tremulous feeling in his arms accompanied by an increased pressure-like sensation in his chest. When I was called by the ED physician she stated that this was accompanied by a near syncopal experience by the patient. Neither the patient nor his wife relate this part of the story to me. The patient states that the sensation was 7/10 in severity, was accompanied by shortness of breath, and lasted only about 4-5 minutes. He states that the sensation started in his wrists and travelled up his arms to his sternum and then up to his neck and back. R/O and no arrythmia on monitor and D/C home.  Reviewed records from [place] in 7/12. Had IMI with ruptured plaque. Stents in circ and LAD patent. Got another stent to RCA. EF 40% Note made of mural apical thrombus but no coumadin given  Seems to be functional class one. No exertional chest pain. No TIA;s. Compliant with meds.  Long discussion with him about criteria for AICD. Need to quantitate EF and recheck mural apical thrombus.  Reviewed multiple records from [facility] Dr Allyson Sabal Recent [facility] admission  [facility]   Cath 4/17 with EF 50% and patent stents  ROS: Denies fever, malais, weight loss, blurry vision, decreased visual acuity, cough, sputum, SOB, hemoptysis, pleuritic pain, palpitaitons, heartburn, abdominal pain, melena, lower extremity edema, claudication, or rash.  All other systems reviewed and negative  General: Affect appropriate Healthy:  appears stated age HEENT:  normal Neck supple with no adenopathy JVP normal no bruits no thyromegaly Lungs clear with no wheezing and good diaphragmatic motion Heart:  S1/S2 no murmur, no rub, gallop or click PMI normal Abdomen: benighn, BS positve, no tenderness, no AAA no bruit.  No HSM or HJR Distal pulses intact with no bruits No edema Neuro non-focal Skin warm and dry No muscular weakness   Current Outpatient Prescriptions  Medication Sig Dispense Refill  . aspirin 325 MG EC tablet Take 325 mg by mouth daily.       . carvedilol (COREG) 3.125 MG tablet Take 1 tablet (3.125 mg total) by mouth 2 (two) times daily with a meal.  60 tablet  3  . clopidogrel (PLAVIX) 75 MG tablet Take 1 tablet (75 mg total) by mouth daily.  30 tablet  3  . Coenzyme Q10 (CO Q10) 100 MG TABS Take 1 tablet by mouth daily.        . fish oil-omega-3 fatty acids 1000 MG capsule Take 1 g by mouth daily.      Marland Kitchen lisinopril (PRINIVIL,ZESTRIL) 10 MG tablet Take 1 tablet (10 mg total) by mouth daily.  30 tablet  3  . LORazepam (ATIVAN) 0.5 MG tablet Take 0.5 mg by mouth every 8 (eight) hours as needed. For anxiety      . nitroGLYCERIN (NITROSTAT) 0.4 MG SL tablet Place 1 tablet (0.4 mg total) under the tongue every 5 (five) minutes x 3 doses as needed. For chest pain.  25 tablet  3  . omega-3 acid ethyl esters (LOVAZA) 1  G capsule Take 1 capsule (1 g total) by mouth daily.  30 capsule  3  . oxyCODONE-acetaminophen (PERCOCET/ROXICET) 5-325 MG per tablet Take 1-2 tablets by mouth every 6 (six) hours as needed for pain.  10 tablet  0  . pantoprazole (PROTONIX) 40 MG tablet Take 1 tablet (40 mg total) by mouth daily.  30 tablet  11  . vitamin B-12 (CYANOCOBALAMIN) 1000 MCG tablet Take 1,000 mcg by mouth daily.      . vitamin C (ASCORBIC ACID) 500 MG tablet Take 500 mg by mouth daily.       Marland Kitchen zolpidem (AMBIEN) 10 MG tablet Take 10 mg by mouth at bedtime as needed. For sleep      . [DISCONTINUED] spironolactone (ALDACTONE) 25 MG tablet Take 25 mg  by mouth daily.        No current facility-administered medications for this visit.    Allergies  Penicillins and Statins  Electrocardiogram: 4/20  SR LAD old anterior and inferior MI  Assessment and Plan

## 2012-07-06 NOTE — Assessment & Plan Note (Signed)
Recent cath with patent stents and no angina continue medical Rx

## 2012-07-06 NOTE — Assessment & Plan Note (Signed)
EF 50% by LV gram better than previous MRI  No need for AICD

## 2012-07-06 NOTE — Telephone Encounter (Signed)
RECORDS SENT TO DR Vinnie Level  IN [facility]   PER PT'S REQUEST HAS AN  UPCOMING APPT OFFICE NUMBER [phone] FAX NUMBER [phone]

## 2012-07-06 NOTE — Patient Instructions (Addendum)
Your physician wants you to follow-up in:  6 MONTHS WITH DR NISHAN  You will receive a reminder letter in the mail two months in advance. If you don't receive a letter, please call our office to schedule the follow-up appointment. Your physician recommends that you continue on your current medications as directed. Please refer to the Current Medication list given to you today. 

## 2012-07-06 NOTE — Assessment & Plan Note (Signed)
Well controlled.  Continue current medications and low sodium Dash type diet.    

## 2012-09-29 ENCOUNTER — Other Ambulatory Visit (HOSPITAL_COMMUNITY): Payer: Self-pay | Admitting: Physician Assistant

## 2013-03-04 ENCOUNTER — Encounter: Payer: Self-pay | Admitting: Cardiovascular Disease

## 2013-03-04 ENCOUNTER — Ambulatory Visit (INDEPENDENT_AMBULATORY_CARE_PROVIDER_SITE_OTHER): Payer: 59 | Admitting: Cardiovascular Disease

## 2013-03-04 VITALS — BP 144/91 | HR 72 | Ht 67.0 in | Wt 191.0 lb

## 2013-03-04 DIAGNOSIS — I2589 Other forms of chronic ischemic heart disease: Secondary | ICD-10-CM

## 2013-03-04 DIAGNOSIS — E785 Hyperlipidemia, unspecified: Secondary | ICD-10-CM

## 2013-03-04 DIAGNOSIS — I10 Essential (primary) hypertension: Secondary | ICD-10-CM | POA: Diagnosis not present

## 2013-03-04 DIAGNOSIS — I255 Ischemic cardiomyopathy: Secondary | ICD-10-CM

## 2013-03-04 MED ORDER — LISINOPRIL 20 MG PO TABS: 20.0000 mg | ORAL_TABLET | Freq: Every day | ORAL | Status: DC

## 2013-03-04 NOTE — Assessment & Plan Note (Signed)
Euvolemic  EF 485% by MRI no need for AICD  Continue medical Rx  Functional class one

## 2013-03-04 NOTE — Progress Notes (Signed)
Patient ID: [name], male   DOB: 1945/09/07, 68 y.o.   MRN: [id number] 68 yo previously followed by Dr Gwenlyn Found. 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 Last procedure in [place] 2009. Spends half the year in [facility] and half here. Recently seen in ER for funny feeling in arms that radiated to head and "presyncope" Has had episodes like this every 4-5 months for the last 3 years No documented neuro deficits or arrythmia. Complaint of feeling a tremulous feeling in his arms accompanied by an increased pressure-like sensation in his chest. When I was called by the ED physician she stated that this was accompanied by a near syncopal experience by the patient. Neither the patient nor his wife relate this part of the story to me. The patient states that the sensation was 7/10 in severity, was accompanied by shortness of breath, and lasted only about 4-5 minutes. He states that the sensation started in his wrists and travelled up his arms to his sternum and then up to his neck and back. R/O and no arrythmia on monitor and D/C home.  Reviewed records from [place] in 7/12. Had IMI with ruptured plaque. Stents in circ and LAD patent. Got another stent to RCA. EF 40% Note made of mural apical thrombus but no coumadin given  Seems to be functional class one. No exertional chest pain. No TIA;s. Compliant with meds.  Long discussion with him about criteria for AICD. Need to quantitate EF and recheck mural apical thrombus.  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  All other systems reviewed and negative  General: Affect appropriate Healthy:  appears stated age 89: normal Neck supple with no adenopathy JVP normal no bruits no thyromegaly Lungs clear with no wheezing and good diaphragmatic motion Heart:  S1/S2 no murmur, no rub, gallop or click PMI normal Abdomen: benighn, BS positve, no tenderness, no AAA no bruit.  No HSM or HJR Distal pulses intact with no bruits No edema Neuro non-focal Skin warm and dry No muscular weakness   Current Outpatient Prescriptions  Medication Sig Dispense Refill  . aspirin 325 MG EC tablet Take 325 mg by mouth daily.       . carvedilol (COREG) 3.125 MG tablet Take 1 tablet (3.125 mg total) by mouth 2 (two) times daily with a meal.  60 tablet  3  . clopidogrel (PLAVIX) 75 MG tablet Take 1 tablet (75 mg total) by mouth daily.  30 tablet  3  . Coenzyme Q10 (CO Q10) 100 MG TABS Take 1 tablet by mouth daily.        . fish oil-omega-3 fatty acids 1000 MG capsule Take 1 g by mouth daily.      Marland Kitchen lisinopril (PRINIVIL,ZESTRIL) 10 MG tablet Take 1 tablet (10 mg total) by  mouth daily.  30 tablet  3  . LORazepam (ATIVAN) 0.5 MG tablet Take 1 tablet (0.5 mg total) by mouth every 8 (eight) hours as needed. For anxiety  60 tablet  5  . NITROSTAT 0.4 MG SL tablet PLACE 1 TABLET UNDER THE TONGUE EVERY 5 MINUTES FOR 3 DOSES AS NEEDED FOR CHEST PAIN  25 tablet  3  . omega-3 acid ethyl esters (LOVAZA) 1 G capsule TAKE 1 CAPSULE EVERY DAY  30 capsule  6  . oxyCODONE-acetaminophen (PERCOCET/ROXICET) 5-325 MG per tablet Take 1-2 tablets by mouth every 6 (six) hours as needed for pain.  10 tablet  0  . pantoprazole (PROTONIX) 40 MG tablet Take 1 tablet (40 mg total) by mouth  daily.  30 tablet  11  . vitamin B-12 (CYANOCOBALAMIN) 1000 MCG tablet Take 1,000 mcg by mouth daily.      . vitamin C (ASCORBIC ACID) 500 MG tablet Take 500 mg by mouth daily.       Marland Kitchen zolpidem (AMBIEN) 10 MG tablet Take 1 tablet (10 mg total) by mouth at bedtime as needed. For sleep  30 tablet  5  . [DISCONTINUED] spironolactone (ALDACTONE) 25 MG tablet Take 25 mg by mouth daily.        No current facility-administered medications for this visit.    Allergies  Penicillins and Statins  Electrocardiogram:  06/13/12  SB rate 50  Old anterior MI with anterolateral T wave changes   Assessment and Plan

## 2013-03-04 NOTE — Patient Instructions (Addendum)
Your physician recommends that you schedule a follow-up appointment in:   [name] has recommended you make the following change in your medication:  INCREASE LISINOPRIL  TO  20 MG  EVERY DAY

## 2013-03-04 NOTE — Assessment & Plan Note (Signed)
Cholesterol is at goal.  Continue current dose of statin and diet Rx.  No myalgias or side effects.  F/U  LFT's in 6 months. Lab Results  Component Value Date   LDLCALC 73 06/11/2012             

## 2013-03-04 NOTE — Assessment & Plan Note (Signed)
He will buy BP cuff and monitor at home  Increase lisinopril to 20 mg  Decrease salt intake He denies ETOH F/U 8 weeks

## 2013-03-08 ENCOUNTER — Other Ambulatory Visit (HOSPITAL_COMMUNITY): Payer: Self-pay | Admitting: Physician Assistant

## 2013-04-08 ENCOUNTER — Other Ambulatory Visit (HOSPITAL_COMMUNITY): Payer: Self-pay | Admitting: Physician Assistant

## 2013-04-29 ENCOUNTER — Encounter: Payer: Self-pay | Admitting: Cardiovascular Disease

## 2013-04-29 ENCOUNTER — Ambulatory Visit (INDEPENDENT_AMBULATORY_CARE_PROVIDER_SITE_OTHER): Payer: 59 | Admitting: Cardiovascular Disease

## 2013-04-29 VITALS — BP 130/68 | HR 53 | Ht 67.0 in | Wt 188.0 lb

## 2013-04-29 DIAGNOSIS — Z9861 Coronary angioplasty status: Secondary | ICD-10-CM

## 2013-04-29 DIAGNOSIS — I2510 Atherosclerotic heart disease of native coronary artery without angina pectoris: Secondary | ICD-10-CM | POA: Diagnosis not present

## 2013-04-29 DIAGNOSIS — I10 Essential (primary) hypertension: Secondary | ICD-10-CM

## 2013-04-29 DIAGNOSIS — I255 Ischemic cardiomyopathy: Secondary | ICD-10-CM

## 2013-04-29 DIAGNOSIS — E785 Hyperlipidemia, unspecified: Secondary | ICD-10-CM

## 2013-04-29 DIAGNOSIS — I2589 Other forms of chronic ischemic heart disease: Secondary | ICD-10-CM | POA: Diagnosis not present

## 2013-04-29 NOTE — Assessment & Plan Note (Signed)
Mild angina taking 2 nitro/month stable and no rest pain Continue medical RX  He will call me if anginal  Pattern accelerates or one nitro does not make it go quickly

## 2013-04-29 NOTE — Progress Notes (Signed)
Patient ID: [name], male   DOB: 12-08-1945, 68 y.o.   MRN: [id number] 68 yo previously followed by Dr Gwenlyn Found. 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 Last procedure in [place] 2009. Spends half the year in [facility] and half here. Recently seen in ER for funny feeling in arms that radiated to head and "presyncope" Has had episodes like this every 4-5 months for the last 3 years No documented neuro deficits or arrythmia. Complaint of feeling a tremulous feeling in his arms accompanied by an increased pressure-like sensation in his chest. When I was called by the ED physician she stated that this was accompanied by a near syncopal experience by the patient. Neither the patient nor his wife relate this part of the story to me. The patient states that the sensation was 7/10 in severity, was accompanied by shortness of breath, and lasted only about 4-5 minutes. He states that the sensation started in his wrists and travelled up his arms to his sternum and then up to his neck and back. R/O and no arrythmia on monitor and D/C home.  Reviewed records from [place] in 7/12. Had IMI with ruptured plaque. Stents in circ and LAD patent. Got another stent to RCA. EF 40% Note made of mural apical thrombus but no coumadin given  Seems to be functional class one. No exertional chest pain. No TIA;s. Compliant with meds.  Long discussion with him about criteria for AICD. Need to quantitate EF and recheck mural apical thrombus.  Reviewed multiple records from [facility] Dr Gwenlyn Found Recent [facility] admission  [facility]  Cath 4/17 with EF 50% and patent stents  F/U MRI 2013 EF 48% No AICD needed  Impression:  1) Moderate LVE EF 48% with distal anterior, septal, apical and inferoapical dyskinesia  2) No mural apical thrombus 3) Full thickness scar involving the distal anterior wall, apex and septum 4) MR should be looked at further with echo 5) Mild LAE  Spending less time in [place] BP  seems to be running high       ROS: Denies fever, malais, weight loss, blurry vision, decreased visual acuity, cough, sputum, SOB, hemoptysis, pleuritic pain, palpitaitons, heartburn, abdominal pain, melena, lower extremity edema, claudication, or rash.  All other systems reviewed and negative  General: Affect appropriate Healthy:  appears stated age 89: normal Neck supple with no adenopathy JVP normal no bruits no thyromegaly Lungs clear with no wheezing and good diaphragmatic motion Heart:  S1/S2 no murmur, no rub, gallop or click PMI normal Abdomen: benighn, BS positve, no tenderness, no AAA no bruit.  No HSM or HJR Distal pulses intact with no bruits No edema Neuro non-focal Skin warm and dry No muscular weakness   Current Outpatient Prescriptions  Medication Sig Dispense Refill  . aspirin 325 MG EC tablet Take 325 mg by mouth daily.       . carvedilol (COREG) 3.125 MG tablet Take 1 tablet (3.125 mg total) by mouth 2 (two) times daily with a meal.  60 tablet  3  . clopidogrel (PLAVIX) 75 MG tablet Take 1 tablet (75 mg total) by mouth daily.  30 tablet  3  . Coenzyme Q10 (CO Q10) 100 MG TABS Take 1 tablet by mouth daily.        . fish oil-omega-3 fatty acids 1000 MG capsule Take 1 g by mouth daily.      Marland Kitchen lisinopril (PRINIVIL,ZESTRIL) 20 MG tablet Take 1 tablet (20 mg total) by mouth daily.  30 tablet  11  . LORazepam (ATIVAN) 0.5 MG tablet Take 1 tablet (0.5 mg total) by mouth every 8 (eight) hours as needed. For anxiety  60 tablet  5  . NITROSTAT 0.4 MG SL tablet PLACE 1 TABLET UNDER THE TONGUE EVERY 5 MINUTES FOR 3 DOSES AS NEEDED FOR CHEST PAIN  25 tablet  3  . NITROSTAT 0.4 MG SL tablet PLACE 1 TABLET UNDER THE TONGUE EVERY 5 MINUTES FOR 3 DOSES AS NEEDED FOR CHEST PAIN  25 tablet  1  . omega-3 acid ethyl esters (LOVAZA) 1 G capsule TAKE 1 CAPSULE EVERY DAY  30 capsule  6  . oxyCODONE-acetaminophen (PERCOCET/ROXICET) 5-325 MG per tablet Take 1-2 tablets by mouth every  6 (six) hours as needed for pain.  10 tablet  0  . pantoprazole (PROTONIX) 40 MG tablet Take 1 tablet (40 mg total) by mouth daily.  30 tablet  11  . vitamin B-12 (CYANOCOBALAMIN) 1000 MCG tablet Take 1,000 mcg by mouth daily.      . vitamin C (ASCORBIC ACID) 500 MG tablet Take 500 mg by mouth daily.       Marland Kitchen zolpidem (AMBIEN) 10 MG tablet Take 1 tablet (10 mg total) by mouth at bedtime as needed. For sleep  30 tablet  5  . [DISCONTINUED] spironolactone (ALDACTONE) 25 MG tablet Take 25 mg by mouth daily.        No current facility-administered medications for this visit.    Allergies  Penicillins and Statins  Electrocardiogram:  SR rate 50 LAD  Old IMI/AMI 2014  Today  SR rate 12 old anterior and inferior MI no sig change   Assessment and Plan

## 2013-04-29 NOTE — Assessment & Plan Note (Signed)
Euvolemic No edema  Last EF by MRI 48%  STable

## 2013-04-29 NOTE — Patient Instructions (Signed)
Your physician wants you to follow-up in:  6 MONTHS WITH DR NISHAN  You will receive a reminder letter in the mail two months in advance. If you don't receive a letter, please call our office to schedule the follow-up appointment. Your physician recommends that you continue on your current medications as directed. Please refer to the Current Medication list given to you today. 

## 2013-04-29 NOTE — Assessment & Plan Note (Signed)
Cholesterol is at goal.  Continue current dose of statin and diet Rx.  No myalgias or side effects.  F/U  LFT's in 6 months. Lab Results  Component Value Date   LDLCALC 73 06/11/2012

## 2013-05-08 ENCOUNTER — Other Ambulatory Visit (HOSPITAL_COMMUNITY): Payer: Self-pay | Admitting: Physician Assistant

## 2013-07-02 ENCOUNTER — Other Ambulatory Visit (HOSPITAL_COMMUNITY): Payer: Self-pay | Admitting: Physician Assistant

## 2013-09-01 ENCOUNTER — Other Ambulatory Visit: Payer: Self-pay | Admitting: *Deleted

## 2013-09-01 MED ORDER — CARVEDILOL 3.125 MG PO TABS: ORAL_TABLET | ORAL | Status: DC

## 2013-10-22 ENCOUNTER — Emergency Department (HOSPITAL_COMMUNITY): Payer: Commercial Managed Care - HMO

## 2013-10-22 ENCOUNTER — Encounter (HOSPITAL_COMMUNITY): Payer: Self-pay | Admitting: Emergency Medicine

## 2013-10-22 ENCOUNTER — Emergency Department (HOSPITAL_COMMUNITY)
Admission: EM | Admit: 2013-10-22 | Discharge: 2013-10-22 | Disposition: A | Discharge status: Home / Self Care | Payer: Commercial Managed Care - HMO | Attending: Emergency Medicine | Admitting: Emergency Medicine

## 2013-10-22 DIAGNOSIS — R6883 Chills (without fever): Secondary | ICD-10-CM | POA: Diagnosis not present

## 2013-10-22 DIAGNOSIS — Z7902 Long term (current) use of antithrombotics/antiplatelets: Secondary | ICD-10-CM | POA: Insufficient documentation

## 2013-10-22 DIAGNOSIS — K219 Gastro-esophageal reflux disease without esophagitis: Secondary | ICD-10-CM | POA: Insufficient documentation

## 2013-10-22 DIAGNOSIS — R109 Unspecified abdominal pain: Secondary | ICD-10-CM | POA: Insufficient documentation

## 2013-10-22 DIAGNOSIS — M549 Dorsalgia, unspecified: Secondary | ICD-10-CM | POA: Insufficient documentation

## 2013-10-22 DIAGNOSIS — Z880 Allergy status to penicillin: Secondary | ICD-10-CM | POA: Insufficient documentation

## 2013-10-22 DIAGNOSIS — I509 Heart failure, unspecified: Secondary | ICD-10-CM | POA: Diagnosis not present

## 2013-10-22 DIAGNOSIS — R110 Nausea: Secondary | ICD-10-CM | POA: Insufficient documentation

## 2013-10-22 DIAGNOSIS — Z79899 Other long term (current) drug therapy: Secondary | ICD-10-CM | POA: Insufficient documentation

## 2013-10-22 DIAGNOSIS — Z7982 Long term (current) use of aspirin: Secondary | ICD-10-CM | POA: Insufficient documentation

## 2013-10-22 DIAGNOSIS — Z87891 Personal history of nicotine dependence: Secondary | ICD-10-CM | POA: Diagnosis not present

## 2013-10-22 DIAGNOSIS — Z9889 Other specified postprocedural states: Secondary | ICD-10-CM | POA: Diagnosis not present

## 2013-10-22 DIAGNOSIS — I2510 Atherosclerotic heart disease of native coronary artery without angina pectoris: Secondary | ICD-10-CM | POA: Diagnosis not present

## 2013-10-22 DIAGNOSIS — R197 Diarrhea, unspecified: Secondary | ICD-10-CM | POA: Diagnosis not present

## 2013-10-22 DIAGNOSIS — M129 Arthropathy, unspecified: Secondary | ICD-10-CM | POA: Diagnosis not present

## 2013-10-22 DIAGNOSIS — Z9861 Coronary angioplasty status: Secondary | ICD-10-CM | POA: Diagnosis not present

## 2013-10-22 DIAGNOSIS — I10 Essential (primary) hypertension: Secondary | ICD-10-CM | POA: Insufficient documentation

## 2013-10-22 DIAGNOSIS — R1031 Right lower quadrant pain: Secondary | ICD-10-CM | POA: Diagnosis not present

## 2013-10-22 DIAGNOSIS — R011 Cardiac murmur, unspecified: Secondary | ICD-10-CM | POA: Diagnosis not present

## 2013-10-22 DIAGNOSIS — F411 Generalized anxiety disorder: Secondary | ICD-10-CM | POA: Insufficient documentation

## 2013-10-22 LAB — CBC WITH DIFFERENTIAL/PLATELET
Basophils Absolute: 0 10*3/uL (ref 0.0–0.1)
Basophils Relative: 0 % (ref 0–1)
EOS ABS: 0.2 10*3/uL (ref 0.0–0.7)
Eosinophils Relative: 2 % (ref 0–5)
HCT: 42.1 % (ref 39.0–52.0)
Hemoglobin: 14.5 g/dL (ref 13.0–17.0)
LYMPHS PCT: 11 % — AB (ref 12–46)
Lymphs Abs: 1.1 10*3/uL (ref 0.7–4.0)
MCH: 32 pg (ref 26.0–34.0)
MCHC: 34.4 g/dL (ref 30.0–36.0)
MCV: 92.9 fL (ref 78.0–100.0)
Monocytes Absolute: 0.5 10*3/uL (ref 0.1–1.0)
Monocytes Relative: 5 % (ref 3–12)
NEUTROS ABS: 7.7 10*3/uL (ref 1.7–7.7)
Neutrophils Relative %: 82 % — ABNORMAL HIGH (ref 43–77)
PLATELETS: 221 10*3/uL (ref 150–400)
RBC: 4.53 MIL/uL (ref 4.22–5.81)
RDW: 13.1 % (ref 11.5–15.5)
WBC: 9.5 10*3/uL (ref 4.0–10.5)

## 2013-10-22 LAB — URINALYSIS, ROUTINE W REFLEX MICROSCOPIC
Bilirubin Urine: NEGATIVE
GLUCOSE, UA: NEGATIVE mg/dL
Ketones, ur: NEGATIVE mg/dL
Nitrite: NEGATIVE
PROTEIN: NEGATIVE mg/dL
SPECIFIC GRAVITY, URINE: 1.021 (ref 1.005–1.030)
Urobilinogen, UA: 1 mg/dL (ref 0.0–1.0)
pH: 5 (ref 5.0–8.0)

## 2013-10-22 LAB — COMPREHENSIVE METABOLIC PANEL
ALT: 53 U/L (ref 0–53)
ANION GAP: 12 (ref 5–15)
AST: 33 U/L (ref 0–37)
Albumin: 4.1 g/dL (ref 3.5–5.2)
Alkaline Phosphatase: 52 U/L (ref 39–117)
BUN: 21 mg/dL (ref 6–23)
CALCIUM: 9.5 mg/dL (ref 8.4–10.5)
CO2: 25 mEq/L (ref 19–32)
CREATININE: 1.17 mg/dL (ref 0.50–1.35)
Chloride: 99 mEq/L (ref 96–112)
GFR, EST AFRICAN AMERICAN: 72 mL/min — AB (ref >90)
GFR, EST NON AFRICAN AMERICAN: 62 mL/min — AB (ref >90)
GLUCOSE: 138 mg/dL — AB (ref 70–99)
Potassium: 4.2 mEq/L (ref 3.7–5.3)
Sodium: 136 mEq/L — ABNORMAL LOW (ref 137–147)
TOTAL PROTEIN: 7.9 g/dL (ref 6.0–8.3)
Total Bilirubin: 0.6 mg/dL (ref 0.3–1.2)

## 2013-10-22 LAB — TROPONIN I: Troponin I: <0.3 ng/mL (ref <0.30)

## 2013-10-22 LAB — URINE MICROSCOPIC-ADD ON

## 2013-10-22 LAB — I-STAT CG4 LACTIC ACID, ED: Lactic Acid, Venous: 1.54 mmol/L (ref 0.5–2.2)

## 2013-10-22 MED ORDER — IOHEXOL 300 MG/ML  SOLN
100.0000 mL | Freq: Once | INTRAMUSCULAR | Status: AC | PRN
  Administered 2013-10-22: 100 mL via INTRAVENOUS

## 2013-10-22 MED ORDER — IOHEXOL 300 MG/ML  SOLN
25.0000 mL | Freq: Once | INTRAMUSCULAR | Status: AC | PRN
  Administered 2013-10-22: 25 mL via ORAL

## 2013-10-22 MED ORDER — DICYCLOMINE HCL 20 MG PO TABS: 20.0000 mg | ORAL_TABLET | Freq: Two times a day (BID) | ORAL | Status: DC

## 2013-10-22 MED ORDER — HYDROMORPHONE HCL PF 1 MG/ML IJ SOLN
1.0000 mg | Freq: Once | INTRAMUSCULAR | Status: AC
  Administered 2013-10-22: 1 mg via INTRAVENOUS
  Filled 2013-10-22: qty 1

## 2013-10-22 MED ORDER — OXYCODONE-ACETAMINOPHEN 5-325 MG PO TABS: 1.0000 | ORAL_TABLET | ORAL | Status: DC | PRN

## 2013-10-22 NOTE — ED Provider Notes (Signed)
CSN: [id number]     Arrival date & time 10/22/13  1109 History   First MD Initiated Contact with Patient 10/22/13 1110     Chief Complaint  Patient presents with  . Abdominal Pain     (Consider location/radiation/quality/duration/timing/severity/associated sxs/prior Treatment) HPI Comments: Patient is a 68 year old male past medical history significant for CAD, hypercholesterolemia, hypertension, GERD, CHF, tobacco abuse presenting to the emergency department for multiple complaints. Patient's first complaint is an episode of feeling warm, pale, nauseous had a large watery diarrhea bowel movement this morning, clammy and pale sensation resolved. This occurred this morning. Patient is complaining of right lower quadrant pain, he typically has chronic right lower part from pain but today states he feels very swollen and has a "gnawing" sensation to the area. Patient has been on MiraLax for constipation. Patient is also complaining of some mid back pain that he states feels like gas pain. His wife is concerned about cardiac etiology so gave the patient his daily 325 mg of aspirin and a nitroglycerin tablet, patient spit the aspirin out and took nitroglycerin with no improvement of symptoms. Patient had colonoscopy and endoscopy approximately 4 weeks ago.  Patient is a 68 y.o. male presenting with abdominal pain.  Abdominal Pain Associated symptoms: chills, diarrhea and nausea   Associated symptoms: no vomiting     Past Medical History  Diagnosis Date  . Coronary artery disease     Pt with 4 stents over the course of 2003-2012  . Hypercholesterolemia   . Hypertension   . Elevated LFTs   . Myocardial infarction   . Ischemic cardiomyopathy   . Angina   . Arthritis   . GERD (gastroesophageal reflux disease)   . CHF (congestive heart failure)   . Heart murmur   . Shortness of breath   . Anxiety    Past Surgical History  Procedure Laterality Date  . Coronary angioplasty with stent  placement      stents x4  . Cosmetic surgery    . Cardiac catheterization  06/11/2012    Nonobstructive CAD, patent stents, EF 50%, mild apical dyskinesis   Family History  Problem Relation Age of Onset  . Coronary artery disease Mother   . Coronary artery disease Father    History  Substance Use Topics  . Smoking status: Former Smoker -- 0.05 packs/day for 15 years    Types: Cigarettes  . Smokeless tobacco: Never Used  . Alcohol Use: 0.6 oz/week    1 Glasses of wine per week     Comment: 1 glass of wine per week    Review of Systems  Constitutional: Positive for chills.  Gastrointestinal: Positive for nausea, abdominal pain and diarrhea. Negative for vomiting, blood in stool and anal bleeding.  Musculoskeletal: Positive for back pain.  All other systems reviewed and are negative.     Allergies  Penicillins and Statins  Home Medications   Prior to Admission medications   Medication Sig Start Date End Date Taking? Authorizing Provider  aspirin 325 MG EC tablet Take 325 mg by mouth daily.    Yes Historical Provider, MD  calcium gluconate 500 MG tablet Take 250 mg by mouth daily.   Yes Historical Provider, MD  carvedilol (COREG) 3.125 MG tablet Take 3.125 mg by mouth 2 (two) times daily with a meal.   Yes Historical Provider, MD  Cholecalciferol (VITAMIN D3) 2000 UNITS capsule Take 2,000 Units by mouth daily.   Yes Historical Provider, MD  clopidogrel (PLAVIX) 75 MG tablet  Take 1 tablet (75 mg total) by mouth daily. 06/11/12  Yes Roger A Arguello, PA-C  Coenzyme Q10 (CO Q10) 100 MG TABS Take 100 mg by mouth daily.    Yes Historical Provider, MD  Cyanocobalamin (VITAMIN B-12) 5000 MCG LOZG Take 5,000 Units by mouth daily.   Yes Historical Provider, MD  lisinopril (PRINIVIL,ZESTRIL) 20 MG tablet Take 1 tablet (20 mg total) by mouth daily. 03/04/13  Yes Josue Hector, MD  LORazepam (ATIVAN) 0.5 MG tablet Take 1 tablet (0.5 mg total) by mouth every 8 (eight) hours as needed. For  anxiety 07/06/12  Yes Josue Hector, MD  nitroGLYCERIN (NITROSTAT) 0.4 MG SL tablet Place 0.4 mg under the tongue every 5 (five) minutes as needed for chest pain. For a max of 3 doses. If no relief, call 911.   Yes Historical Provider, MD  omega-3 acid ethyl esters (LOVAZA) 1 G capsule Take 1 g by mouth daily.   Yes Historical Provider, MD  pantoprazole (PROTONIX) 40 MG tablet Take 1 tablet (40 mg total) by mouth daily. 04/26/12  Yes Josue Hector, MD  polyethylene glycol [facility] / GLYCOLAX) packet Take 17 g by mouth daily as needed for mild constipation or moderate constipation.   Yes Historical Provider, MD  Probiotic Product (ALIGN) 4 MG CAPS Take 4 mg by mouth daily.    Yes Historical Provider, MD  vitamin C (ASCORBIC ACID) 500 MG tablet Take 500 mg by mouth daily.    Yes Historical Provider, MD  zolpidem (AMBIEN) 10 MG tablet Take 1 tablet (10 mg total) by mouth at bedtime as needed. For sleep 07/06/12  Yes Josue Hector, MD  dicyclomine (BENTYL) 20 MG tablet Take 1 tablet (20 mg total) by mouth 2 (two) times daily. 10/22/13   Breah Joa L Lelia Jons, PA-C  oxyCODONE-acetaminophen (PERCOCET/ROXICET) 5-325 MG per tablet Take 1 tablet by mouth every 4 (four) hours as needed for severe pain. May take 2 tablets PO q 6 hours for severe pain - Do not take with Tylenol as this tablet already contains tylenol 10/22/13   Saraiah Bhat L Savino Whisenant, PA-C   BP 114/65  Pulse 63  Temp(Src) 97.9 F (36.6 C) (Oral)  Resp 16  SpO2 99% Physical Exam  Nursing note and vitals reviewed. Constitutional: He is oriented to person, place, and time. He appears well-developed and well-nourished. No distress.  HENT:  Head: Normocephalic and atraumatic.  Right Ear: External ear normal.  Left Ear: External ear normal.  Nose: Nose normal.  Mouth/Throat: Oropharynx is clear and moist. No oropharyngeal exudate.  Eyes: Conjunctivae are normal.  Neck: Normal range of motion. Neck supple.  Cardiovascular: Normal rate,  regular rhythm, normal heart sounds and intact distal pulses.   Pulmonary/Chest: Effort normal and breath sounds normal. He exhibits no tenderness.  Abdominal: Soft. Bowel sounds are normal. He exhibits no distension. There is tenderness (mild RLQ). There is no rebound and no guarding.  Musculoskeletal: Normal range of motion. He exhibits no edema.  Neurological: He is alert and oriented to person, place, and time.  Skin: Skin is warm and dry. No rash noted. He is not diaphoretic. No erythema. No pallor.  Psychiatric: He has a normal mood and affect.    ED Course  Procedures (including critical care time) Medications  HYDROmorphone (DILAUDID) injection 1 mg (1 mg Intravenous Given 10/22/13 1239)  iohexol (OMNIPAQUE) 300 MG/ML solution 25 mL (25 mLs Oral Contrast Given 10/22/13 1145)  iohexol (OMNIPAQUE) 300 MG/ML solution 100 mL (100 mLs Intravenous  Contrast Given 10/22/13 1416)    Labs Review Labs Reviewed  COMPREHENSIVE METABOLIC PANEL - Abnormal; Notable for the following:    Sodium 136 (*)    Glucose, Bld 138 (*)    GFR calc non Af Amer 62 (*)    GFR calc Af Amer 72 (*)    All other components within normal limits  CBC WITH DIFFERENTIAL - Abnormal; Notable for the following:    Neutrophils Relative % 82 (*)    Lymphocytes Relative 11 (*)    All other components within normal limits  URINALYSIS, ROUTINE W REFLEX MICROSCOPIC - Abnormal; Notable for the following:    Color, Urine AMBER (*)    Hgb urine dipstick SMALL (*)    Leukocytes, UA TRACE (*)    All other components within normal limits  URINE CULTURE  TROPONIN I  URINE MICROSCOPIC-ADD ON  I-STAT CG4 LACTIC ACID, ED    Imaging Review Dg Chest 2 View  10/22/2013   CLINICAL DATA:  Chest pain.  EXAM: CHEST  2 VIEW  COMPARISON:  Chest x-ray 06/10/2012.  FINDINGS: Lung volumes are normal. No consolidative airspace disease. No pleural effusions. No pneumothorax. No pulmonary nodule or mass noted. Pulmonary vasculature and the  cardiomediastinal silhouette are within normal limits.  IMPRESSION: No radiographic evidence of acute cardiopulmonary disease.   Electronically Signed   By: Vinnie Langton M.D.   On: 10/22/2013 14:22   Ct Abdomen Pelvis W Contrast  10/22/2013   CLINICAL DATA:  Abdominal pain  EXAM: CT ABDOMEN AND PELVIS WITH CONTRAST  TECHNIQUE: Multidetector CT imaging of the abdomen and pelvis was performed using the standard protocol following bolus administration of intravenous contrast.  CONTRAST:  162mL OMNIPAQUE IOHEXOL 300 MG/ML  SOLN  COMPARISON:  None  FINDINGS: The lung bases are free of acute infiltrate or sizable effusion. Fatty infiltration of the liver is noted. No definitive mass lesion is seen. The gallbladder, spleen, adrenal glands and pancreas are within normal limits. Kidneys are well visualized bilaterally and reveal bilateral renal cystic change. No renal calculi or obstructive changes are noted. The appendix is within normal limits. Diffuse diverticular change is noted. No definitive diverticulitis is noted. No areas of free air are seen. No free pelvic fluid is noted. The bladder is decompressed. The prostate is prominent as are the seminal vesicles. No acute bony abnormality is noted.  IMPRESSION: No acute abnormalities node   Electronically Signed   By: Inez Catalina M.D.   On: 10/22/2013 14:39     EKG Interpretation None      MDM   Final diagnoses:  Right lower quadrant abdominal pain    Filed Vitals:   10/22/13 1535  BP: 114/65  Pulse:   Temp:   Resp: 16   Afebrile, NAD, non-toxic appearing, AAOx4. I have reviewed nursing notes, vital signs, and all appropriate lab and imaging results for this patient. Patient is nontoxic, nonseptic appearing, in no apparent distress.  Patient's pain and other symptoms adequately managed in emergency department.  Fluid bolus given.  Labs, imaging and vitals reviewed.  Patient does not meet the SIRS or Sepsis criteria.  On repeat exam patient does  not have a surgical abdomin and there are nor peritoneal signs.  No indication of appendicitis, bowel obstruction, bowel perforation, cholecystitis, diverticulitis.  Patient discharged home with symptomatic treatment and given strict instructions for follow-up with their primary care physician.  I have also discussed reasons to return immediately to the ER.  Patient expresses understanding and  agrees with plan. Patient is stable at time of discharge. Patient d/w with Dr. Colin Rhein, agrees with plan.          Harlow Mares, PA-C 10/22/13 1622

## 2013-10-22 NOTE — ED Notes (Signed)
To room via EMS. Onset this morning pt got clammy, pale, called EMS.  Pt was hypotensive, went to BR had diarrhea stool and BP increased and was no longer clammy or pale.  CBG 109.  Pt reports had a colonoscopy and endoscopy 3 weeks ago and since then has felt "swollen and a nawing" pain to RLQ.  Has been on Miralax for constipation.  Pain 6/10. BP 110/70 HR 70

## 2013-10-22 NOTE — ED Notes (Signed)
I Stat Lactic Acid results shown to J. Piepenbrink PA

## 2013-10-22 NOTE — Discharge Instructions (Signed)
Please follow up with your primary care physician in 1-2 days. If you do not have one please call the [facility] number listed above. Please follow up with your GI doctor to schedule a follow up appointment.  Please take pain medication and/or muscle relaxants as prescribed and as needed for pain. Please do not drive on narcotic pain medication or on muscle relaxants. Please read all discharge instructions and return precautions.    Abdominal Pain Many things can cause abdominal pain. Usually, abdominal pain is not caused by a disease and will improve without treatment. It can often be observed and treated at home. Your health care provider will do a physical exam and possibly order blood tests and X-rays to help determine the seriousness of your pain. However, in many cases, more time must pass before a clear cause of the pain can be found. Before that point, your health care provider may not know if you need more testing or further treatment. HOME CARE INSTRUCTIONS  Monitor your abdominal pain for any changes. The following actions may help to alleviate any discomfort you are experiencing:  Only take over-the-counter or prescription medicines as directed by your health care provider.  Do not take laxatives unless directed to do so by your health care provider.  Try a clear liquid diet (broth, tea, or water) as directed by your health care provider. Slowly move to a bland diet as tolerated. SEEK MEDICAL CARE IF:  You have unexplained abdominal pain.  You have abdominal pain associated with nausea or diarrhea.  You have pain when you urinate or have a bowel movement.  You experience abdominal pain that wakes you in the night.  You have abdominal pain that is worsened or improved by eating food.  You have abdominal pain that is worsened with eating fatty foods.  You have a fever. SEEK IMMEDIATE MEDICAL CARE IF:   Your pain does not go away within 2 hours.  You  keep throwing up (vomiting).  Your pain is felt only in portions of the abdomen, such as the right side or the left lower portion of the abdomen.  You pass bloody or black tarry stools. MAKE SURE YOU:  Understand these instructions.   Will watch your condition.   Will get help right away if you are not doing well or get worse.  Document Released: 11/20/2004 Document Revised: 02/15/2013 Document Reviewed: 10/20/2012 [facility] Patient Information 2015 [place], [place]. This information is not intended to replace advice given to you by your health care provider. Make sure you discuss any questions you have with your health care provider.

## 2013-10-23 LAB — URINE CULTURE
Colony Count: NO GROWTH
Culture: NO GROWTH

## 2013-10-23 NOTE — ED Provider Notes (Signed)
Medical screening examination/treatment/procedure(s) were conducted as a shared visit with non-physician practitioner(s) and myself.  I personally evaluated the patient during the encounter.   EKG Interpretation None      I performed an examination on the patient including cardiac, pulmonary, and gi systems which were unremarkable  Briefly, pt is a 68 y.o. male presenting with recurrent abd pain.  CT scan obtained and remarkable for diverticulosis.  Symptom relief obtained after NS bolus.  DC home in stable condition.   Debby Freiberg, MD 10/23/13 [phone]

## 2013-11-16 DIAGNOSIS — K589 Irritable bowel syndrome without diarrhea: Secondary | ICD-10-CM | POA: Insufficient documentation

## 2013-11-29 ENCOUNTER — Other Ambulatory Visit: Payer: Self-pay

## 2013-11-29 MED ORDER — CARVEDILOL 3.125 MG PO TABS: 3.1250 mg | ORAL_TABLET | Freq: Two times a day (BID) | ORAL | Status: DC

## 2014-02-02 ENCOUNTER — Encounter (HOSPITAL_COMMUNITY): Payer: Self-pay | Admitting: Cardiology

## 2014-03-06 DIAGNOSIS — J019 Acute sinusitis, unspecified: Secondary | ICD-10-CM | POA: Diagnosis not present

## 2014-03-06 DIAGNOSIS — R05 Cough: Secondary | ICD-10-CM | POA: Diagnosis not present

## 2014-03-06 DIAGNOSIS — G4700 Insomnia, unspecified: Secondary | ICD-10-CM | POA: Diagnosis not present

## 2014-03-06 DIAGNOSIS — Z6830 Body mass index (BMI) 30.0-30.9, adult: Secondary | ICD-10-CM | POA: Diagnosis not present

## 2014-03-06 DIAGNOSIS — M19049 Primary osteoarthritis, unspecified hand: Secondary | ICD-10-CM | POA: Diagnosis not present

## 2014-03-24 ENCOUNTER — Encounter (HOSPITAL_COMMUNITY): Payer: Self-pay | Admitting: Neurology

## 2014-03-24 ENCOUNTER — Emergency Department (HOSPITAL_COMMUNITY): Payer: Commercial Managed Care - HMO

## 2014-03-24 ENCOUNTER — Emergency Department (HOSPITAL_COMMUNITY)
Admission: EM | Admit: 2014-03-24 | Discharge: 2014-03-24 | Disposition: A | Discharge status: Home / Self Care | Payer: Commercial Managed Care - HMO | Attending: Emergency Medicine | Admitting: Emergency Medicine

## 2014-03-24 DIAGNOSIS — Z880 Allergy status to penicillin: Secondary | ICD-10-CM | POA: Insufficient documentation

## 2014-03-24 DIAGNOSIS — Z79899 Other long term (current) drug therapy: Secondary | ICD-10-CM | POA: Diagnosis not present

## 2014-03-24 DIAGNOSIS — R079 Chest pain, unspecified: Secondary | ICD-10-CM | POA: Insufficient documentation

## 2014-03-24 DIAGNOSIS — Z7982 Long term (current) use of aspirin: Secondary | ICD-10-CM | POA: Insufficient documentation

## 2014-03-24 DIAGNOSIS — I252 Old myocardial infarction: Secondary | ICD-10-CM | POA: Insufficient documentation

## 2014-03-24 DIAGNOSIS — F419 Anxiety disorder, unspecified: Secondary | ICD-10-CM | POA: Insufficient documentation

## 2014-03-24 DIAGNOSIS — I10 Essential (primary) hypertension: Secondary | ICD-10-CM | POA: Insufficient documentation

## 2014-03-24 DIAGNOSIS — Z9861 Coronary angioplasty status: Secondary | ICD-10-CM | POA: Insufficient documentation

## 2014-03-24 DIAGNOSIS — E780 Pure hypercholesterolemia: Secondary | ICD-10-CM | POA: Diagnosis not present

## 2014-03-24 DIAGNOSIS — Z9889 Other specified postprocedural states: Secondary | ICD-10-CM | POA: Diagnosis not present

## 2014-03-24 DIAGNOSIS — Z7902 Long term (current) use of antithrombotics/antiplatelets: Secondary | ICD-10-CM | POA: Diagnosis not present

## 2014-03-24 DIAGNOSIS — J40 Bronchitis, not specified as acute or chronic: Secondary | ICD-10-CM | POA: Diagnosis not present

## 2014-03-24 DIAGNOSIS — M1990 Unspecified osteoarthritis, unspecified site: Secondary | ICD-10-CM | POA: Diagnosis not present

## 2014-03-24 DIAGNOSIS — R011 Cardiac murmur, unspecified: Secondary | ICD-10-CM | POA: Diagnosis not present

## 2014-03-24 DIAGNOSIS — Z792 Long term (current) use of antibiotics: Secondary | ICD-10-CM | POA: Diagnosis not present

## 2014-03-24 DIAGNOSIS — Z87891 Personal history of nicotine dependence: Secondary | ICD-10-CM | POA: Diagnosis not present

## 2014-03-24 DIAGNOSIS — I25119 Atherosclerotic heart disease of native coronary artery with unspecified angina pectoris: Secondary | ICD-10-CM | POA: Insufficient documentation

## 2014-03-24 DIAGNOSIS — J42 Unspecified chronic bronchitis: Secondary | ICD-10-CM | POA: Diagnosis not present

## 2014-03-24 DIAGNOSIS — K219 Gastro-esophageal reflux disease without esophagitis: Secondary | ICD-10-CM | POA: Diagnosis not present

## 2014-03-24 DIAGNOSIS — I509 Heart failure, unspecified: Secondary | ICD-10-CM | POA: Insufficient documentation

## 2014-03-24 LAB — BASIC METABOLIC PANEL
Anion gap: 8 (ref 5–15)
BUN: 14 mg/dL (ref 6–23)
CO2: 25 mmol/L (ref 19–32)
Calcium: 9.6 mg/dL (ref 8.4–10.5)
Chloride: 99 mmol/L (ref 96–112)
Creatinine, Ser: 1.3 mg/dL (ref 0.50–1.35)
GFR calc Af Amer: 64 mL/min — ABNORMAL LOW (ref >90)
GFR calc non Af Amer: 55 mL/min — ABNORMAL LOW (ref >90)
Glucose, Bld: 115 mg/dL — ABNORMAL HIGH (ref 70–99)
Potassium: 4.3 mmol/L (ref 3.5–5.1)
Sodium: 132 mmol/L — ABNORMAL LOW (ref 135–145)

## 2014-03-24 LAB — I-STAT TROPONIN, ED: Troponin i, poc: 0.01 ng/mL (ref 0.00–0.08)

## 2014-03-24 LAB — CBC
HCT: 40.4 % (ref 39.0–52.0)
Hemoglobin: 13.7 g/dL (ref 13.0–17.0)
MCH: 31.4 pg (ref 26.0–34.0)
MCHC: 33.9 g/dL (ref 30.0–36.0)
MCV: 92.4 fL (ref 78.0–100.0)
Platelets: 185 10*3/uL (ref 150–400)
RBC: 4.37 MIL/uL (ref 4.22–5.81)
RDW: 13.3 % (ref 11.5–15.5)
WBC: 5.3 10*3/uL (ref 4.0–10.5)

## 2014-03-24 MED ORDER — GUAIFENESIN-CODEINE 100-10 MG/5ML PO SOLN
10.0000 mL | Freq: Once | ORAL | Status: AC
  Administered 2014-03-24: 10 mL via ORAL
  Filled 2014-03-24: qty 10

## 2014-03-24 MED ORDER — GUAIFENESIN-CODEINE 100-10 MG/5ML PO SYRP: 5.0000 mL | ORAL_SOLUTION | Freq: Three times a day (TID) | ORAL | Status: DC | PRN

## 2014-03-24 MED ORDER — IBUPROFEN 400 MG PO TABS
600.0000 mg | ORAL_TABLET | Freq: Once | ORAL | Status: AC
  Administered 2014-03-24: 600 mg via ORAL
  Filled 2014-03-24 (×2): qty 1

## 2014-03-24 MED ORDER — PREDNISONE 20 MG PO TABS: 40.0000 mg | ORAL_TABLET | Freq: Every day | ORAL | Status: DC

## 2014-03-24 MED ORDER — PREDNISONE 20 MG PO TABS
60.0000 mg | ORAL_TABLET | Freq: Once | ORAL | Status: AC
  Administered 2014-03-24: 60 mg via ORAL
  Filled 2014-03-24: qty 3

## 2014-03-24 NOTE — ED Notes (Signed)
Pt reports cp for several weeks, is getting worse. Also has cough that is getting worse. Denies SOB, n/v. Has cardiac hx. Pt is a x 4

## 2014-03-24 NOTE — ED Provider Notes (Signed)
CSN: [id number]     Arrival date & time 03/24/14  1020 History   First MD Initiated Contact with Patient 03/24/14 1043     Chief Complaint  Patient presents with  . Chest Pain     (Consider location/radiation/quality/duration/timing/severity/associated sxs/prior Treatment) HPI   68yM with cough and chest /abdominal soreness. Has been dealing with this for a couple weeks. Presenting today because symptoms not improving. No fever or chills. No n/v. No SOB. Known CAD. Describes chest and anterior abdominal soreness which is worse with coughing. He feels this may be from frequent coughing. Denies exertional symptoms. No unusual leg pain or swelling.   Past Medical History  Diagnosis Date  . Coronary artery disease     Pt with 4 stents over the course of 2003-2012  . Hypercholesterolemia   . Hypertension   . Elevated LFTs   . Myocardial infarction   . Ischemic cardiomyopathy   . Angina   . Arthritis   . GERD (gastroesophageal reflux disease)   . CHF (congestive heart failure)   . Heart murmur   . Shortness of breath   . Anxiety    Past Surgical History  Procedure Laterality Date  . Coronary angioplasty with stent placement      stents x4  . Cosmetic surgery    . Cardiac catheterization  06/11/2012    Nonobstructive CAD, patent stents, EF 50%, mild apical dyskinesis  . Left heart catheterization with coronary angiogram N/A 06/11/2012    Procedure: LEFT HEART CATHETERIZATION WITH CORONARY ANGIOGRAM;  Surgeon: Peter M Martinique, MD;  Location[facility] CATH LAB;  Service: Cardiovascular;  Laterality: N/A;   Family History  Problem Relation Age of Onset  . Coronary artery disease Mother   . Coronary artery disease Father    History  Substance Use Topics  . Smoking status: Former Smoker -- 0.05 packs/day for 15 years    Types: Cigarettes  . Smokeless tobacco: Never Used  . Alcohol Use: 0.6 oz/week    1 Glasses of wine per week     Comment: 1 glass of wine per week    Review of  Systems  All systems reviewed and negative, other than as noted in HPI.   Allergies  Penicillins and Statins  Home Medications   Prior to Admission medications   Medication Sig Start Date End Date Taking? Authorizing Provider  aspirin 325 MG EC tablet Take 325 mg by mouth daily.    Yes Historical Provider, MD  carvedilol (COREG) 3.125 MG tablet Take 1 tablet (3.125 mg total) by mouth 2 (two) times daily with a meal. 11/29/13  Yes Josue Hector, MD  ciprofloxacin (CIPRO) 500 MG tablet Take 500 mg by mouth 2 (two) times daily. Started couple days ago from today (03-24-14)   Yes Historical Provider, MD  clopidogrel (PLAVIX) 75 MG tablet Take 1 tablet (75 mg total) by mouth daily. 06/11/12  Yes Roger A Arguello, PA-C  Coenzyme Q10 (CO Q10) 100 MG TABS Take 100 mg by mouth daily.    Yes Historical Provider, MD  Cyanocobalamin (VITAMIN B-12) 5000 MCG LOZG Take 5,000 Units by mouth daily.   Yes Historical Provider, MD  dicyclomine (BENTYL) 20 MG tablet Take 1 tablet (20 mg total) by mouth 2 (two) times daily. 10/22/13  Yes Jennifer L Piepenbrink, PA-C  lisinopril (PRINIVIL,ZESTRIL) 20 MG tablet Take 1 tablet (20 mg total) by mouth daily. 03/04/13  Yes Josue Hector, MD  LORazepam (ATIVAN) 0.5 MG tablet Take 1 tablet (0.5 mg  total) by mouth every 8 (eight) hours as needed. For anxiety 07/06/12  Yes Josue Hector, MD  nitroGLYCERIN (NITROSTAT) 0.4 MG SL tablet Place 0.4 mg under the tongue every 5 (five) minutes as needed for chest pain. For a max of 3 doses. If no relief, call 911.   Yes Historical Provider, MD  omega-3 acid ethyl esters (LOVAZA) 1 G capsule Take 1 g by mouth daily.   Yes Historical Provider, MD  oxyCODONE-acetaminophen (PERCOCET/ROXICET) 5-325 MG per tablet Take 1 tablet by mouth every 4 (four) hours as needed for severe pain. May take 2 tablets PO q 6 hours for severe pain - Do not take with Tylenol as this tablet already contains tylenol 10/22/13  Yes Jennifer L Piepenbrink, PA-C   pantoprazole (PROTONIX) 40 MG tablet Take 1 tablet (40 mg total) by mouth daily. 04/26/12  Yes Josue Hector, MD  polyethylene glycol [facility] / GLYCOLAX) packet Take 17 g by mouth daily as needed for mild constipation or moderate constipation.   Yes Historical Provider, MD  Probiotic Product (ALIGN) 4 MG CAPS Take 4 mg by mouth daily.    Yes Historical Provider, MD  vitamin C (ASCORBIC ACID) 500 MG tablet Take 500 mg by mouth daily.    Yes Historical Provider, MD  zolpidem (AMBIEN) 10 MG tablet Take 1 tablet (10 mg total) by mouth at bedtime as needed. For sleep 07/06/12  Yes Josue Hector, MD   BP 110/66 mmHg  Pulse 69  Temp(Src) 99.7 F (37.6 C) (Oral)  Resp 14  SpO2 98% Physical Exam  Constitutional: He appears well-developed and well-nourished. No distress.  HENT:  Head: Normocephalic and atraumatic.  Eyes: Conjunctivae are normal. Right eye exhibits no discharge. Left eye exhibits no discharge.  Neck: Neck supple.  Cardiovascular: Normal rate, regular rhythm and normal heart sounds.  Exam reveals no gallop and no friction rub.   No murmur heard. Pulmonary/Chest: Effort normal. No respiratory distress.  Faint expiratory wheezing. No accessory muscle usage. Speaking in complete sentences.   Abdominal: Soft. He exhibits no distension. There is no tenderness.  Musculoskeletal: He exhibits no edema or tenderness.  Lower extremities symmetric as compared to each other. No calf tenderness. Negative Homan's. No palpable cords.   Neurological: He is alert.  Skin: Skin is warm and dry.  Psychiatric: He has a normal mood and affect. His behavior is normal. Thought content normal.  Nursing note and vitals reviewed.   ED Course  Procedures (including critical care time) Labs Review Labs Reviewed  BASIC METABOLIC PANEL - Abnormal; Notable for the following:    Sodium 132 (*)    Glucose, Bld 115 (*)    GFR calc non Af Amer 55 (*)    GFR calc Af Amer 64 (*)    All other components  within normal limits  CBC  I-STAT TROPOININ, ED    Imaging Review Dg Chest 2 View  03/24/2014   CLINICAL DATA:  Cough and chest pain for 1 week.  EXAM: CHEST  2 VIEW  COMPARISON:  10/22/2013.  FINDINGS: The cardiac silhouette, mediastinal and hilar contours are normal and stable. There is mild tortuosity of the thoracic aorta. Mild chronic bronchitic type lung changes but no acute pulmonary findings. No pleural effusion. The bony thorax is intact.  IMPRESSION: Mild chronic bronchitic changes but no acute pulmonary findings.   Electronically Signed   By: Kalman Jewels M.D.   On: 03/24/2014 10:48     EKG Interpretation   Date/Time:  Friday March 24 2014 10:23:42 EST Ventricular Rate:  74 PR Interval:  182 QRS Duration: 82 QT Interval:  330 QTC Calculation: 366 R Axis:   -68 Text Interpretation:  Normal sinus rhythm Left axis deviation q waves  previously noted Abnormal ECG Confirmed by Crewe Heathman  MD, Thatiana Renbarger ([id number]) on  03/24/2014 11:01:51 AM      MDM   Final diagnoses:  Chest pain, unspecified chest pain type  Bronchitis    68yM with cough and chest/abdominal soreness. Suspect viral illness. Mild wheezing on exam. CXR w/o focal infiltrate. o2 sats fine on RA and no increased WOB. No clinical signs/symptoms of DVT. Doubt PE.     Virgel Manifold, MD 03/29/14 [phone]

## 2014-03-24 NOTE — ED Notes (Signed)
Registration at bedside.

## 2014-05-15 DIAGNOSIS — E785 Hyperlipidemia, unspecified: Secondary | ICD-10-CM | POA: Diagnosis not present

## 2014-05-15 DIAGNOSIS — R7309 Other abnormal glucose: Secondary | ICD-10-CM | POA: Diagnosis not present

## 2014-05-15 DIAGNOSIS — I2510 Atherosclerotic heart disease of native coronary artery without angina pectoris: Secondary | ICD-10-CM | POA: Diagnosis not present

## 2014-05-15 DIAGNOSIS — Z79899 Other long term (current) drug therapy: Secondary | ICD-10-CM | POA: Diagnosis not present

## 2014-05-15 DIAGNOSIS — R8290 Unspecified abnormal findings in urine: Secondary | ICD-10-CM | POA: Diagnosis not present

## 2014-05-15 DIAGNOSIS — Z125 Encounter for screening for malignant neoplasm of prostate: Secondary | ICD-10-CM | POA: Diagnosis not present

## 2014-05-23 DIAGNOSIS — F5221 Male erectile disorder: Secondary | ICD-10-CM | POA: Diagnosis not present

## 2014-05-23 DIAGNOSIS — R312 Other microscopic hematuria: Secondary | ICD-10-CM | POA: Diagnosis not present

## 2014-05-23 DIAGNOSIS — R6882 Decreased libido: Secondary | ICD-10-CM | POA: Diagnosis not present

## 2014-05-23 DIAGNOSIS — H9190 Unspecified hearing loss, unspecified ear: Secondary | ICD-10-CM | POA: Diagnosis not present

## 2014-05-23 DIAGNOSIS — R740 Nonspecific elevation of levels of transaminase and lactic acid dehydrogenase [LDH]: Secondary | ICD-10-CM | POA: Diagnosis not present

## 2014-05-23 DIAGNOSIS — Z0000 Encounter for general adult medical examination without abnormal findings: Secondary | ICD-10-CM | POA: Diagnosis not present

## 2014-05-23 DIAGNOSIS — R7309 Other abnormal glucose: Secondary | ICD-10-CM | POA: Diagnosis not present

## 2014-05-23 DIAGNOSIS — E785 Hyperlipidemia, unspecified: Secondary | ICD-10-CM | POA: Diagnosis not present

## 2014-05-31 DIAGNOSIS — Z1212 Encounter for screening for malignant neoplasm of rectum: Secondary | ICD-10-CM | POA: Diagnosis not present

## 2014-06-02 ENCOUNTER — Encounter: Payer: Self-pay | Admitting: Cardiovascular Disease

## 2014-06-02 ENCOUNTER — Ambulatory Visit (INDEPENDENT_AMBULATORY_CARE_PROVIDER_SITE_OTHER): Payer: Commercial Managed Care - HMO | Admitting: Cardiovascular Disease

## 2014-06-02 VITALS — BP 120/66 | HR 69 | Ht 67.0 in | Wt 196.8 lb

## 2014-06-02 DIAGNOSIS — I2510 Atherosclerotic heart disease of native coronary artery without angina pectoris: Secondary | ICD-10-CM

## 2014-06-02 DIAGNOSIS — R7989 Other specified abnormal findings of blood chemistry: Secondary | ICD-10-CM

## 2014-06-02 DIAGNOSIS — E785 Hyperlipidemia, unspecified: Secondary | ICD-10-CM

## 2014-06-02 DIAGNOSIS — E291 Testicular hypofunction: Secondary | ICD-10-CM | POA: Diagnosis not present

## 2014-06-02 DIAGNOSIS — Z9861 Coronary angioplasty status: Secondary | ICD-10-CM

## 2014-06-02 DIAGNOSIS — I10 Essential (primary) hypertension: Secondary | ICD-10-CM | POA: Diagnosis not present

## 2014-06-02 NOTE — Progress Notes (Signed)
Patient ID: [name], male   DOB: December 03, 1945, 69 y.o.   MRN: [id number] 69 y.o.  previously followed by Dr Gwenlyn Found. 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 Last procedure in [place] 2009. Spends half the year in [facility] and half here.   Reviewed records from [place] in 7/12. Had IMI with ruptured plaque. Stents in circ and LAD patent. Got another stent to RCA. EF 40% Note made of mural apical thrombus but no coumadin given   Cath 06/10/12  with EF 50% and patent stents  F/U MRI 2013 EF 48% No AICD needed  Impression:  1) Moderate LVE EF 48% with distal anterior, septal, apical and inferoapical dyskinesia  2) No mural apical thrombus 3) Full thickness scar involving the distal anterior wall, apex and septum 4) MR should be looked at further with echo 5) Mild LAE   In ER 03/24/14 with chest and abdominal pain r/o ? 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  All other systems reviewed and negative  General: Affect appropriate Healthy:  appears stated age 69: normal Neck supple with no adenopathy JVP normal no bruits no thyromegaly Lungs clear with no wheezing and good diaphragmatic motion Heart:  S1/S2 no murmur, no rub, gallop or click PMI normal Abdomen: benighn, BS positve, no tenderness, no AAA no bruit.  No HSM or HJR Distal pulses intact with no bruits No edema Neuro non-focal Skin warm and dry No muscular weakness   Current Outpatient Prescriptions  Medication Sig Dispense Refill  . aspirin 325 MG EC tablet Take 325 mg by mouth daily.     . carvedilol (COREG) 3.125 MG tablet Take 1  tablet (3.125 mg total) by mouth 2 (two) times daily with a meal. 60 tablet 3  . ciprofloxacin (CIPRO) 500 MG tablet Take 500 mg by mouth 2 (two) times daily. Started couple days ago from today (03-24-14)    . clopidogrel (PLAVIX) 75 MG tablet Take 1 tablet (75 mg total) by mouth daily. 30 tablet 3  . Coenzyme Q10 (CO Q10) 100 MG TABS Take 100 mg by mouth daily.     . Cyanocobalamin (VITAMIN B-12) 5000 MCG LOZG Take 5,000 Units by mouth daily.    Marland Kitchen dicyclomine (BENTYL) 20 MG tablet Take 1 tablet (20 mg total) by mouth 2 (two) times daily. 20 tablet 0  . guaiFENesin-codeine (ROBITUSSIN AC) 100-10 MG/5ML syrup Take 5-10 mLs by mouth 3 (three) times daily as needed for cough. 120 mL 0  . lisinopril (PRINIVIL,ZESTRIL) 20 MG tablet Take 1 tablet (20 mg total) by mouth daily. 30 tablet 11  . LORazepam (ATIVAN) 0.5 MG tablet Take 1 tablet (0.5 mg total) by mouth every 8 (eight) hours as needed. For anxiety 60 tablet 5  . nitroGLYCERIN (NITROSTAT) 0.4 MG SL tablet Place 0.4 mg under the tongue every 5 (five) minutes as needed for chest pain. For a max of 3 doses. If no relief, call 911.    . omega-3 acid ethyl esters (LOVAZA) 1 G capsule Take 1 g by mouth daily.    Marland Kitchen oxyCODONE-acetaminophen (PERCOCET/ROXICET) 5-325 MG per tablet Take 1 tablet by mouth every 4 (four) hours as needed  for severe pain. May take 2 tablets PO q 6 hours for severe pain - Do not take with Tylenol as this tablet already contains tylenol 15 tablet 0  . pantoprazole (PROTONIX) 40 MG tablet Take 1 tablet (40 mg total) by mouth daily. 30 tablet 11  . polyethylene glycol (MIRALAX / GLYCOLAX) packet Take 17 g by mouth daily as needed for mild constipation or moderate constipation.    . predniSONE (DELTASONE) 20 MG tablet Take 2 tablets (40 mg total) by mouth daily. 10 tablet 0  . Probiotic Product (ALIGN) 4 MG CAPS Take 4 mg by mouth daily.     . vitamin C (ASCORBIC ACID) 500 MG tablet Take 500 mg by mouth daily.     Marland Kitchen zolpidem (AMBIEN) 10  MG tablet Take 1 tablet (10 mg total) by mouth at bedtime as needed. For sleep 30 tablet 5  . [DISCONTINUED] spironolactone (ALDACTONE) 25 MG tablet Take 25 mg by mouth daily.      No current facility-administered medications for this visit.    Allergies  Penicillins and Statins  Electrocardiogram:  SR rate 50 LAD  Old IMI/AMI 2014  Today  SR rate 32 old anterior and inferior MI no sig change   Assessment and Plan

## 2014-06-02 NOTE — Patient Instructions (Signed)
Your physician wants you to follow-up in:  6 MONTHS WITH DR NISHAN  You will receive a reminder letter in the mail two months in advance. If you don't receive a letter, please call our office to schedule the follow-up appointment. Your physician recommends that you continue on your current medications as directed. Please refer to the Current Medication list given to you today. 

## 2014-06-02 NOTE — Assessment & Plan Note (Addendum)
Reviewed labs rom [facility] 04/23/14  ALT 72 high LDL 128  Lipo B 114 ( 90)  Cannot tolerate statins  F/u Howerda consider lipid clinic referral for praluent given extensive CAD

## 2014-06-02 NOTE — Assessment & Plan Note (Signed)
He indicates primary starting Rx for this told him only low dose On ASA / Plavix but testosterone replacement can exacerbate angina

## 2014-06-02 NOTE — Assessment & Plan Note (Signed)
Stable with no angina and good activity level.  Continue medical Rx Patent stents last cath 2014

## 2014-06-02 NOTE — Assessment & Plan Note (Signed)
Well controlled.  Continue current medications and low sodium Dash type diet.    

## 2014-06-23 DIAGNOSIS — R195 Other fecal abnormalities: Secondary | ICD-10-CM | POA: Diagnosis not present

## 2014-07-27 ENCOUNTER — Encounter (HOSPITAL_COMMUNITY): Payer: Self-pay | Admitting: *Deleted

## 2014-07-27 ENCOUNTER — Emergency Department (HOSPITAL_COMMUNITY): Payer: Commercial Managed Care - HMO

## 2014-07-27 ENCOUNTER — Inpatient Hospital Stay (HOSPITAL_COMMUNITY)
Admission: EM | Admit: 2014-07-27 | Discharge: 2014-07-29 | DRG: 287 | Disposition: A | Discharge status: Home / Self Care | Payer: Commercial Managed Care - HMO | Attending: Cardiovascular Disease | Admitting: Cardiovascular Disease

## 2014-07-27 DIAGNOSIS — Z79899 Other long term (current) drug therapy: Secondary | ICD-10-CM

## 2014-07-27 DIAGNOSIS — K219 Gastro-esophageal reflux disease without esophagitis: Secondary | ICD-10-CM | POA: Diagnosis not present

## 2014-07-27 DIAGNOSIS — Z880 Allergy status to penicillin: Secondary | ICD-10-CM | POA: Diagnosis not present

## 2014-07-27 DIAGNOSIS — I252 Old myocardial infarction: Secondary | ICD-10-CM | POA: Diagnosis not present

## 2014-07-27 DIAGNOSIS — I25110 Atherosclerotic heart disease of native coronary artery with unstable angina pectoris: Principal | ICD-10-CM | POA: Diagnosis present

## 2014-07-27 DIAGNOSIS — Z9861 Coronary angioplasty status: Secondary | ICD-10-CM

## 2014-07-27 DIAGNOSIS — Z8249 Family history of ischemic heart disease and other diseases of the circulatory system: Secondary | ICD-10-CM | POA: Diagnosis not present

## 2014-07-27 DIAGNOSIS — Z7982 Long term (current) use of aspirin: Secondary | ICD-10-CM

## 2014-07-27 DIAGNOSIS — F419 Anxiety disorder, unspecified: Secondary | ICD-10-CM | POA: Diagnosis present

## 2014-07-27 DIAGNOSIS — I209 Angina pectoris, unspecified: Secondary | ICD-10-CM

## 2014-07-27 DIAGNOSIS — I2510 Atherosclerotic heart disease of native coronary artery without angina pectoris: Secondary | ICD-10-CM | POA: Diagnosis not present

## 2014-07-27 DIAGNOSIS — I200 Unstable angina: Secondary | ICD-10-CM | POA: Diagnosis not present

## 2014-07-27 DIAGNOSIS — I10 Essential (primary) hypertension: Secondary | ICD-10-CM | POA: Diagnosis not present

## 2014-07-27 DIAGNOSIS — M1990 Unspecified osteoarthritis, unspecified site: Secondary | ICD-10-CM | POA: Diagnosis present

## 2014-07-27 DIAGNOSIS — E785 Hyperlipidemia, unspecified: Secondary | ICD-10-CM | POA: Diagnosis not present

## 2014-07-27 DIAGNOSIS — Z87891 Personal history of nicotine dependence: Secondary | ICD-10-CM

## 2014-07-27 DIAGNOSIS — Z888 Allergy status to other drugs, medicaments and biological substances status: Secondary | ICD-10-CM

## 2014-07-27 DIAGNOSIS — Z7902 Long term (current) use of antithrombotics/antiplatelets: Secondary | ICD-10-CM | POA: Diagnosis not present

## 2014-07-27 DIAGNOSIS — I255 Ischemic cardiomyopathy: Secondary | ICD-10-CM | POA: Diagnosis not present

## 2014-07-27 DIAGNOSIS — Z7952 Long term (current) use of systemic steroids: Secondary | ICD-10-CM

## 2014-07-27 DIAGNOSIS — R079 Chest pain, unspecified: Secondary | ICD-10-CM | POA: Insufficient documentation

## 2014-07-27 DIAGNOSIS — R011 Cardiac murmur, unspecified: Secondary | ICD-10-CM | POA: Diagnosis present

## 2014-07-27 DIAGNOSIS — Z955 Presence of coronary angioplasty implant and graft: Secondary | ICD-10-CM

## 2014-07-27 DIAGNOSIS — R0789 Other chest pain: Secondary | ICD-10-CM | POA: Diagnosis not present

## 2014-07-27 DIAGNOSIS — I509 Heart failure, unspecified: Secondary | ICD-10-CM | POA: Diagnosis not present

## 2014-07-27 LAB — BASIC METABOLIC PANEL
Anion gap: 8 (ref 5–15)
BUN: 16 mg/dL (ref 6–20)
CO2: 26 mmol/L (ref 22–32)
Calcium: 9.9 mg/dL (ref 8.9–10.3)
Chloride: 103 mmol/L (ref 101–111)
Creatinine, Ser: 1.27 mg/dL — ABNORMAL HIGH (ref 0.61–1.24)
GFR calc Af Amer: >60 mL/min (ref >60)
GFR calc non Af Amer: 56 mL/min — ABNORMAL LOW (ref >60)
Glucose, Bld: 122 mg/dL — ABNORMAL HIGH (ref 65–99)
Potassium: 4.8 mmol/L (ref 3.5–5.1)
Sodium: 137 mmol/L (ref 135–145)

## 2014-07-27 LAB — CBC
HCT: 40.2 % (ref 39.0–52.0)
Hemoglobin: 13.7 g/dL (ref 13.0–17.0)
MCH: 31.6 pg (ref 26.0–34.0)
MCHC: 34.1 g/dL (ref 30.0–36.0)
MCV: 92.6 fL (ref 78.0–100.0)
PLATELETS: 217 10*3/uL (ref 150–400)
RBC: 4.34 MIL/uL (ref 4.22–5.81)
RDW: 13.1 % (ref 11.5–15.5)
WBC: 4.9 10*3/uL (ref 4.0–10.5)

## 2014-07-27 LAB — I-STAT TROPONIN, ED: TROPONIN I, POC: 0 ng/mL (ref 0.00–0.08)

## 2014-07-27 MED ORDER — HEPARIN (PORCINE) IN NACL 100-0.45 UNIT/ML-% IJ SOLN
1100.0000 [IU]/h | INTRAMUSCULAR | Status: DC
  Administered 2014-07-27: 1250 [IU]/h via INTRAVENOUS
  Administered 2014-07-28: 1100 [IU]/h via INTRAVENOUS
  Filled 2014-07-27 (×2): qty 250

## 2014-07-27 MED ORDER — ASPIRIN EC 81 MG PO TBEC
81.0000 mg | DELAYED_RELEASE_TABLET | Freq: Every day | ORAL | Status: DC
  Administered 2014-07-29: 81 mg via ORAL
  Filled 2014-07-27 (×2): qty 1

## 2014-07-27 MED ORDER — HEPARIN BOLUS VIA INFUSION
4000.0000 [IU] | Freq: Once | INTRAVENOUS | Status: AC
  Administered 2014-07-27: 4000 [IU] via INTRAVENOUS
  Filled 2014-07-27: qty 4000

## 2014-07-27 MED ORDER — POLYETHYLENE GLYCOL 3350 17 G PO PACK
17.0000 g | PACK | Freq: Every day | ORAL | Status: DC
  Administered 2014-07-28 – 2014-07-29 (×2): 17 g via ORAL
  Filled 2014-07-27 (×3): qty 1

## 2014-07-27 MED ORDER — VITAMIN C 500 MG PO TABS
500.0000 mg | ORAL_TABLET | Freq: Every day | ORAL | Status: DC
  Administered 2014-07-29: 500 mg via ORAL
  Filled 2014-07-27 (×2): qty 1

## 2014-07-27 MED ORDER — NITROGLYCERIN 0.4 MG SL SUBL: 0.4000 mg | SUBLINGUAL_TABLET | SUBLINGUAL | Status: DC | PRN

## 2014-07-27 MED ORDER — PANTOPRAZOLE SODIUM 40 MG PO TBEC
40.0000 mg | DELAYED_RELEASE_TABLET | Freq: Every day | ORAL | Status: DC
  Administered 2014-07-29: 40 mg via ORAL
  Filled 2014-07-27 (×2): qty 1

## 2014-07-27 MED ORDER — OMEGA-3-ACID ETHYL ESTERS 1 G PO CAPS
1.0000 g | ORAL_CAPSULE | Freq: Every day | ORAL | Status: DC
  Administered 2014-07-29: 1 g via ORAL
  Filled 2014-07-27 (×2): qty 1

## 2014-07-27 MED ORDER — CLOPIDOGREL BISULFATE 75 MG PO TABS
75.0000 mg | ORAL_TABLET | Freq: Every day | ORAL | Status: DC
  Administered 2014-07-28 – 2014-07-29 (×2): 75 mg via ORAL
  Filled 2014-07-27 (×3): qty 1

## 2014-07-27 MED ORDER — LISINOPRIL 20 MG PO TABS
20.0000 mg | ORAL_TABLET | Freq: Every day | ORAL | Status: DC
  Administered 2014-07-29: 20 mg via ORAL
  Filled 2014-07-27 (×2): qty 1

## 2014-07-27 MED ORDER — CARVEDILOL 3.125 MG PO TABS
3.1250 mg | ORAL_TABLET | Freq: Two times a day (BID) | ORAL | Status: DC
  Administered 2014-07-28 – 2014-07-29 (×3): 3.125 mg via ORAL
  Filled 2014-07-27 (×3): qty 1

## 2014-07-27 NOTE — ED Provider Notes (Signed)
CSN: [id number]     Arrival date & time 07/27/14  1526 History   First MD Initiated Contact with Patient 07/27/14 1540     Chief Complaint  Patient presents with  . Chest Pain    Patient is a 68 y.o. male presenting with chest pain. The history is provided by the patient.  Chest Pain Pain location:  L chest Pain quality: pressure   Pain radiates to:  L arm Pain severity:  Moderate Duration:  20 minutes (started a couple of days ago) Timing:  Intermittent Progression:  Worsening Chronicity:  New Context: at rest   Relieved by:  Nitroglycerin Associated symptoms: no diaphoresis, no fever, no nausea, no shortness of breath, not vomiting and no weakness   Risk factors: coronary artery disease   Sx feel similar to his prior sx he associates with his angina.  In the past it felt like severe pressure.  This was not as bad but does feel similar.  He feels a little stiff still in the left chest area.  Past Medical History  Diagnosis Date  . Coronary artery disease     Pt with 4 stents over the course of 2003-2012  . Hypercholesterolemia   . Hypertension   . Elevated LFTs   . Myocardial infarction   . Ischemic cardiomyopathy   . Angina   . Arthritis   . GERD (gastroesophageal reflux disease)   . CHF (congestive heart failure)   . Heart murmur   . Shortness of breath   . Anxiety    Past Surgical History  Procedure Laterality Date  . Coronary angioplasty with stent placement      stents x4  . Cosmetic surgery    . Cardiac catheterization  06/11/2012    Nonobstructive CAD, patent stents, EF 50%, mild apical dyskinesis  . Left heart catheterization with coronary angiogram N/A 06/11/2012    Procedure: LEFT HEART CATHETERIZATION WITH CORONARY ANGIOGRAM;  Surgeon: Peter M Martinique, MD;  Location: [facility] CATH LAB;  Service: Cardiovascular;  Laterality: N/A;   Family History  Problem Relation Age of Onset  . Coronary artery disease Mother   . Coronary artery disease Father    History   Substance Use Topics  . Smoking status: Former Smoker -- 0.05 packs/day for 15 years    Types: Cigarettes  . Smokeless tobacco: Never Used  . Alcohol Use: 0.6 oz/week    1 Glasses of wine per week     Comment: 1 glass of wine per week    Review of Systems  Constitutional: Negative for fever and diaphoresis.  Respiratory: Negative for shortness of breath.   Cardiovascular: Positive for chest pain.  Gastrointestinal: Negative for nausea and vomiting.  Neurological: Negative for weakness.  All other systems reviewed and are negative.     Allergies  Penicillins and Statins  Home Medications   Prior to Admission medications   Medication Sig Start Date End Date Taking? Authorizing Provider  aspirin 325 MG EC tablet Take 325 mg by mouth daily.    Yes Historical Provider, MD  carvedilol (COREG) 3.125 MG tablet Take 1 tablet (3.125 mg total) by mouth 2 (two) times daily with a meal. 11/29/13  Yes Josue Hector, MD  clopidogrel (PLAVIX) 75 MG tablet Take 1 tablet (75 mg total) by mouth daily. 06/11/12  Yes Roger A Arguello, PA-C  Cyanocobalamin (VITAMIN B-12) 5000 MCG LOZG Take 5,000 Units by mouth daily.   Yes Historical Provider, MD  dicyclomine (BENTYL) 20 MG tablet Take 1  tablet (20 mg total) by mouth 2 (two) times daily. Patient taking differently: Take 20 mg by mouth daily.  10/22/13  Yes Jennifer Piepenbrink, PA-C  lisinopril (PRINIVIL,ZESTRIL) 20 MG tablet Take 1 tablet (20 mg total) by mouth daily. 03/04/13  Yes Josue Hector, MD  LORazepam (ATIVAN) 0.5 MG tablet Take 1 tablet (0.5 mg total) by mouth every 8 (eight) hours as needed. For anxiety 07/06/12  Yes Josue Hector, MD  nitroGLYCERIN (NITROSTAT) 0.4 MG SL tablet Place 0.4 mg under the tongue every 5 (five) minutes as needed for chest pain. For a max of 3 doses. If no relief, call 911.   Yes Historical Provider, MD  omega-3 acid ethyl esters (LOVAZA) 1 G capsule Take 1 g by mouth daily.   Yes Historical Provider, MD   pantoprazole (PROTONIX) 40 MG tablet Take 1 tablet (40 mg total) by mouth daily. 04/26/12  Yes Josue Hector, MD  polyethylene glycol (MIRALAX / GLYCOLAX) packet Take 17 g by mouth daily.    Yes Historical Provider, MD  Probiotic Product (ALIGN) 4 MG CAPS Take 4 mg by mouth daily.    Yes Historical Provider, MD  vitamin C (ASCORBIC ACID) 500 MG tablet Take 500 mg by mouth daily.    Yes Historical Provider, MD  zolpidem (AMBIEN) 10 MG tablet Take 1 tablet (10 mg total) by mouth at bedtime as needed. For sleep 07/06/12  Yes Josue Hector, MD  guaiFENesin-codeine [facility]) 100-10 MG/5ML syrup Take 5-10 mLs by mouth 3 (three) times daily as needed for cough. 03/24/14   Virgel Manifold, MD  oxyCODONE-acetaminophen (PERCOCET/ROXICET) 5-325 MG per tablet Take 1 tablet by mouth every 4 (four) hours as needed for severe pain. May take 2 tablets PO q 6 hours for severe pain - Do not take with Tylenol as this tablet already contains tylenol 10/22/13   Baron Sane, PA-C  predniSONE (DELTASONE) 20 MG tablet Take 2 tablets (40 mg total) by mouth daily. 03/24/14   Virgel Manifold, MD  Turmeric Curcumin 500 MG CAPS Take 500 mg by mouth daily.     Historical Provider, MD   BP 109/72 mmHg  Pulse 70  Temp(Src) 98.4 F (36.9 C) (Oral)  Resp 20  SpO2 98% Physical Exam  Constitutional: He appears well-developed and well-nourished. No distress.  HENT:  Head: Normocephalic and atraumatic.  Right Ear: External ear normal.  Left Ear: External ear normal.  Eyes: Conjunctivae are normal. Right eye exhibits no discharge. Left eye exhibits no discharge. No scleral icterus.  Neck: Neck supple. No tracheal deviation present.  Cardiovascular: Normal rate, regular rhythm and intact distal pulses.   Pulmonary/Chest: Effort normal and breath sounds normal. No stridor. No respiratory distress. He has no wheezes. He has no rales.  Abdominal: Soft. Bowel sounds are normal. He exhibits no distension. There is no  tenderness. There is no rebound and no guarding.  Musculoskeletal: He exhibits no edema or tenderness.  Neurological: He is alert. He has normal strength. No cranial nerve deficit (no facial droop, extraocular movements intact, no slurred speech) or sensory deficit. He exhibits normal muscle tone. He displays no seizure activity. Coordination normal.  Skin: Skin is warm and dry. No rash noted.  Psychiatric: He has a normal mood and affect.  Nursing note and vitals reviewed.   ED Course  Procedures (including critical care time) Labs Review Labs Reviewed  BASIC METABOLIC PANEL - Abnormal; Notable for the following:    Glucose, Bld 122 (*)  Creatinine, Ser 1.27 (*)    GFR calc non Af Amer 56 (*)    All other components within normal limits  CBC  I-STAT TROPOININ, ED    Imaging Review Dg Chest Port 1 View  07/27/2014   CLINICAL DATA:  Chest pain for 3 days.  EXAM: PORTABLE CHEST - 1 VIEW  COMPARISON:  03/24/2014  FINDINGS: Mid thoracic spondylosis. Upper normal heart size. Mild tortuosity of the thoracic aorta.  The lungs appear clear.  IMPRESSION: 1. No active cardiopulmonary disease is radiographically apparent. 2. Mild thoracic spondylosis.   Electronically Signed   By: Van Clines M.D.   On: 07/27/2014 15:56     EKG Interpretation   Date/Time:  Thursday July 27 2014 15:30:56 EDT Ventricular Rate:  58 PR Interval:  182 QRS Duration: 89 QT Interval:  355 QTC Calculation: 349 R Axis:   -75 Text Interpretation:  Sinus rhythm Anterolateral infarct, age  indeterminate No significant change since last tracing Confirmed by Jelicia Nantz   MD-J, Glada Wickstrom ([id number]) on 07/27/2014 3:46:42 PM      MDM   Final diagnoses:  Chest pain, unspecified chest pain type    Cardiac cath in 2014: Left anterior descending (LAD): There is extensive stenting of the proximal LAD. The stents are widely patent. Distal to the second stent there is 30-40% disease in the mid LAD. The first diagonal is without  significant disease.  Left circumflex (LCx): The left circumflex gives rise to 2 large marginal branches. There is 30% disease in the proximal left circumflex. The mid vessel has a stent that is widely patent. The first marginal branch has 20-30% disease proximally.  Right coronary artery (RCA): The right coronary is a large dominant vessel. There is a stent in the proximal vessel which is widely patent. It has less than 20% disease. The mid and distal right coronary are are irregular with disease up to 20%.  Pt's initial EKg apears unchanged.  Sx are concerning for potential ACS.  Will consult with cardiology to discuss possible admission, observation, serial enzymes.    Dorie Rank, MD 07/28/14 [phone]

## 2014-07-27 NOTE — Progress Notes (Signed)
ANTICOAGULATION CONSULT NOTE - Initial Consult  Pharmacy Consult for heparin Indication: chest pain/ACS  Allergies  Allergen Reactions  . Penicillins Anaphylaxis  . Statins Other (See Comments)    Pt tolerates lovastatin as a home medication without difficulties.    Patient Measurements: Height: 5' 6.93" (170 cm) Weight: 196 lb 13.9 oz (89.3 kg) (per previous visit) IBW/kg (Calculated) : 65.94 Heparin Dosing Weight: 84kg  Vital Signs: Temp: 98.4 F (36.9 C) (06/02 1534) Temp Source: Oral (06/02 1534) BP: 122/65 mmHg (06/02 1800) Pulse Rate: 50 (06/02 1800)  Labs:  Recent Labs  07/27/14 1536  HGB 13.7  HCT 40.2  PLT 217  CREATININE 1.27*    Estimated Creatinine Clearance: 58.5 mL/min (by C-G formula based on Cr of 1.27).   Medical History: Past Medical History  Diagnosis Date  . Coronary artery disease     Pt with 4 stents over the course of 2003-2012  . Hypercholesterolemia   . Hypertension   . Elevated LFTs   . Myocardial infarction   . Ischemic cardiomyopathy   . Angina   . Arthritis   . GERD (gastroesophageal reflux disease)   . CHF (congestive heart failure)   . Heart murmur   . Shortness of breath   . Anxiety     Medications:  Infusions:  . heparin 1,250 Units/hr (07/27/14 1910)    Assessment: 54 yom presented to the ED with CP. To start IV heparin for unstable angina with plans for cardiac cath tomorrow morning. Baseline CBC is WNL. He is not on anticoagulation PTA.   Goal of Therapy:  Heparin level 0.3-0.7 units/ml Monitor platelets by anticoagulation protocol: Yes   Plan:  - Heparin bolus 4000 units IV x 1 - Heparin gtt 1250 units/hr - Check a 6 hour heparin level - Daily heparin level and CBC  Edra Riccardi, Rande Lawman 07/27/2014,7:15 PM

## 2014-07-27 NOTE — Consult Note (Addendum)
CARDIOLOGY CONSULT NOTE   Patient ID: [name] MRN: [id number], DOB/AGE: 01-Mar-1945   Admit date: 07/27/2014 Date of Consult: 07/27/2014  Primary Physician: Velna Hatchet, MD Primary Cardiologist: Dr Jenkins Rouge  Reason for consult:  Chest pain  Problem List  Past Medical History  Diagnosis Date  . Coronary artery disease     Pt with 4 stents over the course of 2003-2012  . Hypercholesterolemia   . Hypertension   . Elevated LFTs   . Myocardial infarction   . Ischemic cardiomyopathy   . Angina   . Arthritis   . GERD (gastroesophageal reflux disease)   . CHF (congestive heart failure)   . Heart murmur   . Shortness of breath   . Anxiety     Past Surgical History  Procedure Laterality Date  . Coronary angioplasty with stent placement      stents x4  . Cosmetic surgery    . Cardiac catheterization  06/11/2012    Nonobstructive CAD, patent stents, EF 50%, mild apical dyskinesis  . Left heart catheterization with coronary angiogram N/A 06/11/2012    Procedure: LEFT HEART CATHETERIZATION WITH CORONARY ANGIOGRAM;  Surgeon: Peter M Martinique, MD;  Location: [facility] CATH LAB;  Service: Cardiovascular;  Laterality: N/A;     Allergies  Allergies  Allergen Reactions  . Penicillins Anaphylaxis  . Statins Other (See Comments)    Pt tolerates lovastatin as a home medication without difficulties.    HPI   69 y.o. followed by Dr Johnsie Cancel, previously followed by Dr Gwenlyn Found. History of CAD, LAD and LCX stents with anterior MI.  In 7/12 he had IMI with ruptured plaque. Stents in LCX and LAD patent. Got another stent to RCA. EF 40% Note made of mural apical thrombus but no coumadin given.  Cath 06/10/12 with EF 50% and patent stents  F/U MRI 2013 EF 48% No AICD needed  Impression: 1) Moderate LVE EF 48% with distal anterior, septal, apical and inferoapical dyskinesia 2) No mural apical thrombus 3) Full thickness scar involving the distal anterior wall, apex and septum 4) MR  should be looked at further with echo 5) Mild LAE  In ER 03/24/14 with chest and abdominal pain r/o ? URI CXR bronchitis No issues last 2 months Not taking nitro, he was last seen by Dr Johnsie Cancel on 06/02/2014 and was chest pain free.   He presented to the ER today with left sided chest pressures that started two days ago, gets worse on exertion, progressively worsened and now radiates to his left arm, relieved by NTG. He describes that this is the same type of pain as before his MIs in the past. He denies diaphoresis, nausea, vomiting, fever, SOB, dizziness.  He has been complaint with his meds, however he was not on statins as they cause him significant muscle pain, LFTs elevation with inability to walk.    Cardiac cath in 2014:  Left anterior descending (LAD): There is extensive stenting of the proximal LAD. The stents are widely patent. Distal to the second stent there is 30-40% disease in the mid LAD. The first diagonal is without significant disease.  Left circumflex (LCx): The left circumflex gives rise to 2 large marginal branches. There is 30% disease in the proximal left circumflex. The mid vessel has a stent that is widely patent. The first marginal branch has 20-30% disease proximally.  Right coronary artery (RCA): The right coronary is a large dominant vessel. There is a stent in the proximal vessel which is widely  patent. It has less than 20% disease. The mid and distal right coronary are are irregular with disease up to 20%.  Current outpatient prescriptions:  .  aspirin 325 MG EC tablet, Take 325 mg by mouth daily. , Disp: , Rfl:  .  carvedilol (COREG) 3.125 MG tablet, Take 1 tablet (3.125 mg total) by mouth 2 (two) times daily with a meal., Disp: 60 tablet, Rfl: 3 .  clopidogrel (PLAVIX) 75 MG tablet, Take 1 tablet (75 mg total) by mouth daily., Disp: 30 tablet, Rfl: 3 .  lisinopril (PRINIVIL,ZESTRIL) 20 MG tablet, Take 1 tablet (20 mg total) by mouth daily., Disp: 30 tablet, Rfl:  11 .  nitroGLYCERIN (NITROSTAT) 0.4 MG SL tablet, Place 0.4 mg under the tongue every 5 (five) minutes as needed for chest pain. For a max of 3 doses. If no relief, call 911., Disp: , Rfl:  .  omega-3 acid ethyl esters (LOVAZA) 1 G capsule, Take 1 g by mouth daily., Disp: , Rfl:  .  pantoprazole (PROTONIX) 40 MG tablet, Take 1 tablet (40 mg total) by mouth daily., Disp: 30 tablet, Rfl: 11 .  zolpidem (AMBIEN) 10 MG tablet, Take 1 tablet (10 mg total) by mouth at bedtime as needed. For sleep, Disp: 30 tablet, Rfl: 5 .  oxyCODONE-acetaminophen (PERCOCET/ROXICET) 5-325 MG per tablet, Take 1 tablet by mouth every 4 (four) hours as needed for severe pain. May take 2 tablets PO q 6 hours for severe pain - Do not take with Tylenol as this tablet already contains tylenol, Disp: 15 tablet, Rfl: 0 .  predniSONE (DELTASONE) 20 MG tablet, Take 2 tablets (40 mg total) by mouth daily., Disp: 10 tablet, Rfl: 0 .  [DISCONTINUED] spironolactone (ALDACTONE) 25 MG tablet, Take 25 mg by mouth daily. , Disp: , Rfl:   Family History Family History  Problem Relation Age of Onset  . Coronary artery disease Mother   . Coronary artery disease Father     Social History History   Social History  . Marital Status: Divorced    Spouse Name: N/A  . Number of Children: N/A  . Years of Education: N/A   Occupational History  . Not on file.   Social History Main Topics  . Smoking status: Former Smoker -- 0.05 packs/day for 15 years    Types: Cigarettes  . Smokeless tobacco: Never Used  . Alcohol Use: 0.6 oz/week    1 Glasses of wine per week     Comment: 1 glass of wine per week  . Drug Use: No  . Sexual Activity: Yes   Other Topics Concern  . Not on file   Social History Narrative     Review of Systems  General:  No chills, fever, night sweats or weight changes.  Cardiovascular:  No chest pain, dyspnea on exertion, edema, orthopnea, palpitations, paroxysmal nocturnal dyspnea. Dermatological: No rash,  lesions/masses Respiratory: No cough, dyspnea Urologic: No hematuria, dysuria Abdominal:   No nausea, vomiting, diarrhea, bright red blood per rectum, melena, or hematemesis Neurologic:  No visual changes, wkns, changes in mental status. All other systems reviewed and are otherwise negative except as noted above.  Physical Exam  Blood pressure 109/72, pulse 70, temperature 98.4 F (36.9 C), temperature source Oral, resp. rate 20, SpO2 98 %.  General: Pleasant, NAD Psych: Normal affect. Neuro: Alert and oriented X 3. Moves all extremities spontaneously. HEENT: Normal  Neck: Supple without bruits or JVD. Lungs:  Resp regular and unlabored, CTA. Heart: RRR no s3, s4,  or murmurs. Abdomen: Soft, non-tender, non-distended, BS + x 4.  Extremities: No clubbing, cyanosis or edema. DP/PT/Radials 2+ and equal bilaterally.  Labs  No results for input(s): CKTOTAL, CKMB, TROPONINI in the last 72 hours. Lab Results  Component Value Date   WBC 4.9 07/27/2014   HGB 13.7 07/27/2014   HCT 40.2 07/27/2014   MCV 92.6 07/27/2014   PLT 217 07/27/2014    Recent Labs Lab 07/27/14 1536  NA 137  K 4.8  CL 103  CO2 26  BUN 16  CREATININE 1.27*  CALCIUM 9.9  GLUCOSE 122*   Lab Results  Component Value Date   CHOL 180 06/11/2012   HDL 34* 06/11/2012   LDLCALC 73 06/11/2012   TRIG 367* 06/11/2012   Radiology/Studies  Dg Chest Port 1 View  07/27/2014   CLINICAL DATA:  Chest pain for 3 days.  EXAM: PORTABLE CHEST - 1 VIEW  COMPARISON:  03/24/2014  FINDINGS: Mid thoracic spondylosis. Upper normal heart size. Mild tortuosity of the thoracic aorta.  The lungs appear clear.  IMPRESSION: 1. No active cardiopulmonary disease is radiographically apparent. 2. Mild thoracic spondylosis.   Electronically Signed   By: Van Clines M.D.   On: 07/27/2014 15:56   Echocardiogram - none  ECG: SR, prior inferior and anterolateral MI, unchanged from 03/25/2014     ASSESSMENT AND PLAN  1. CAD,  typical chest pain - suspicious for unstable angina - we will start iv heparin and schedule for a cath tomorrow.  ECG unchanged from prior. The first troponin is negative.  Continue ASA 81 mg po daily, plavix, carvedilol, lisinopril, no statin  2. Hypertension - controlled  3. Hyperlipidemia - with severe TAG elevation on omega 3 acids only - the patient will need to be referred to the lipid clinic for PCSK 9 inhibitors    Signed, Dorothy Spark, MD, [facility] 07/27/2014, 5:38 PM

## 2014-07-27 NOTE — ED Notes (Signed)
MD at bedside. 

## 2014-07-27 NOTE — ED Notes (Signed)
Pt arrives EMS from home c/o CP x 3 days, worsening over today. Denies N/V/SOB. CP to left chest and arm. Reports that he has been working out recently with weights. EMS found pt in NSR BP 130/88 HR 73. Pt took 324 ASA at home and 2 NTG with relief.

## 2014-07-28 ENCOUNTER — Encounter (HOSPITAL_COMMUNITY)
Admission: EM | Disposition: A | Discharge status: Home / Self Care | Payer: Commercial Managed Care - HMO | Source: Home / Self Care | Attending: Cardiovascular Disease

## 2014-07-28 DIAGNOSIS — R079 Chest pain, unspecified: Secondary | ICD-10-CM | POA: Insufficient documentation

## 2014-07-28 DIAGNOSIS — I25110 Atherosclerotic heart disease of native coronary artery with unstable angina pectoris: Principal | ICD-10-CM

## 2014-07-28 HISTORY — PX: CARDIAC CATHETERIZATION: SHX172

## 2014-07-28 LAB — CBC
HEMATOCRIT: 40.3 % (ref 39.0–52.0)
Hemoglobin: 13.7 g/dL (ref 13.0–17.0)
MCH: 31.6 pg (ref 26.0–34.0)
MCHC: 34 g/dL (ref 30.0–36.0)
MCV: 92.9 fL (ref 78.0–100.0)
Platelets: 203 10*3/uL (ref 150–400)
RBC: 4.34 MIL/uL (ref 4.22–5.81)
RDW: 13.1 % (ref 11.5–15.5)
WBC: 6 10*3/uL (ref 4.0–10.5)

## 2014-07-28 LAB — HEPARIN LEVEL (UNFRACTIONATED)
Heparin Unfractionated: 0.41 IU/mL (ref 0.30–0.70)
Heparin Unfractionated: 0.76 IU/mL — ABNORMAL HIGH (ref 0.30–0.70)

## 2014-07-28 LAB — PROTIME-INR
INR: 1.27 (ref 0.00–1.49)
PROTHROMBIN TIME: 16 s — AB (ref 11.6–15.2)

## 2014-07-28 LAB — TROPONIN I: Troponin I: <0.03 ng/mL (ref <0.031)

## 2014-07-28 SURGERY — LEFT HEART CATH AND CORONARY ANGIOGRAPHY
Anesthesia: LOCAL

## 2014-07-28 MED ORDER — SODIUM CHLORIDE 0.9 % IJ SOLN: 3.0000 mL | INTRAMUSCULAR | Status: DC | PRN

## 2014-07-28 MED ORDER — SODIUM CHLORIDE 0.9 % IJ SOLN: 3.0000 mL | Freq: Two times a day (BID) | INTRAMUSCULAR | Status: DC

## 2014-07-28 MED ORDER — SODIUM CHLORIDE 0.9 % WEIGHT BASED INFUSION
1.0000 mL/kg/h | INTRAVENOUS | Status: DC
  Administered 2014-07-28: 1 mL/kg/h via INTRAVENOUS

## 2014-07-28 MED ORDER — LORAZEPAM 0.5 MG PO TABS
0.5000 mg | ORAL_TABLET | Freq: Three times a day (TID) | ORAL | Status: DC | PRN
  Administered 2014-07-28: 0.5 mg via ORAL
  Filled 2014-07-28: qty 1

## 2014-07-28 MED ORDER — SODIUM CHLORIDE 0.9 % WEIGHT BASED INFUSION
3.0000 mL/kg/h | INTRAVENOUS | Status: DC
  Administered 2014-07-28: 3 mL/kg/h via INTRAVENOUS

## 2014-07-28 MED ORDER — MIDAZOLAM HCL 2 MG/2ML IJ SOLN
INTRAMUSCULAR | Status: DC | PRN
  Administered 2014-07-28: 2 mg via INTRAVENOUS

## 2014-07-28 MED ORDER — IOHEXOL 350 MG/ML SOLN
INTRAVENOUS | Status: DC | PRN
  Administered 2014-07-28: 90 mL via INTRAVENOUS

## 2014-07-28 MED ORDER — FENTANYL CITRATE (PF) 100 MCG/2ML IJ SOLN
INTRAMUSCULAR | Status: DC | PRN
  Administered 2014-07-28: 50 ug via INTRAVENOUS

## 2014-07-28 MED ORDER — FENTANYL CITRATE (PF) 100 MCG/2ML IJ SOLN
INTRAMUSCULAR | Status: AC
  Filled 2014-07-28: qty 2

## 2014-07-28 MED ORDER — ASPIRIN EC 81 MG PO TBEC: 81.0000 mg | DELAYED_RELEASE_TABLET | Freq: Every day | ORAL | Status: DC

## 2014-07-28 MED ORDER — LIDOCAINE HCL (PF) 1 % IJ SOLN
INTRAMUSCULAR | Status: AC
  Filled 2014-07-28: qty 30

## 2014-07-28 MED ORDER — HEPARIN SODIUM (PORCINE) 1000 UNIT/ML IJ SOLN
INTRAMUSCULAR | Status: DC | PRN
  Administered 2014-07-28: 4500 [IU] via INTRAVENOUS

## 2014-07-28 MED ORDER — VERAPAMIL HCL 2.5 MG/ML IV SOLN
INTRAVENOUS | Status: AC
  Filled 2014-07-28: qty 2

## 2014-07-28 MED ORDER — SODIUM CHLORIDE 0.9 % IV SOLN: 250.0000 mL | INTRAVENOUS | Status: DC | PRN

## 2014-07-28 MED ORDER — ASPIRIN 81 MG PO CHEW
81.0000 mg | CHEWABLE_TABLET | ORAL | Status: AC
  Administered 2014-07-28: 81 mg via ORAL
  Filled 2014-07-28: qty 1

## 2014-07-28 MED ORDER — HEPARIN (PORCINE) IN NACL 2-0.9 UNIT/ML-% IJ SOLN
INTRAMUSCULAR | Status: AC
  Filled 2014-07-28: qty 1500

## 2014-07-28 MED ORDER — ACETAMINOPHEN 325 MG PO TABS: 650.0000 mg | ORAL_TABLET | ORAL | Status: DC | PRN

## 2014-07-28 MED ORDER — LIDOCAINE HCL (PF) 1 % IJ SOLN
INTRAMUSCULAR | Status: DC | PRN
  Administered 2014-07-28: 20 mL via SUBCUTANEOUS

## 2014-07-28 MED ORDER — HEPARIN SODIUM (PORCINE) 1000 UNIT/ML IJ SOLN
INTRAMUSCULAR | Status: AC
  Filled 2014-07-28: qty 1

## 2014-07-28 MED ORDER — VERAPAMIL HCL 2.5 MG/ML IV SOLN
INTRAVENOUS | Status: DC | PRN
  Administered 2014-07-28: 14:00:00 via INTRA_ARTERIAL

## 2014-07-28 MED ORDER — MIDAZOLAM HCL 2 MG/2ML IJ SOLN
INTRAMUSCULAR | Status: AC
  Filled 2014-07-28: qty 2

## 2014-07-28 MED ORDER — CLOPIDOGREL BISULFATE 75 MG PO TABS: 75.0000 mg | ORAL_TABLET | Freq: Every day | ORAL | Status: DC

## 2014-07-28 MED ORDER — ONDANSETRON HCL 4 MG/2ML IJ SOLN: 4.0000 mg | Freq: Four times a day (QID) | INTRAMUSCULAR | Status: DC | PRN

## 2014-07-28 MED ORDER — NITROGLYCERIN 1 MG/10 ML FOR IR/CATH LAB
INTRA_ARTERIAL | Status: AC
  Filled 2014-07-28: qty 10

## 2014-07-28 MED ORDER — SODIUM CHLORIDE 0.9 % IV SOLN
INTRAVENOUS | Status: AC
  Administered 2014-07-28: 15:00:00 via INTRAVENOUS

## 2014-07-28 SURGICAL SUPPLY — 10 items
CATH INFINITI 5FR ANG PIGTAIL (CATHETERS) ×3 IMPLANT
CATH OPTITORQUE TIG 4.0 5F (CATHETERS) ×3 IMPLANT
DEVICE RAD COMP TR BAND LRG (VASCULAR PRODUCTS) ×3 IMPLANT
GLIDESHEATH SLEND A-KIT 6F 22G (SHEATH) ×3 IMPLANT
KIT HEART LEFT (KITS) ×3 IMPLANT
PACK CARDIAC CATHETERIZATION (CUSTOM PROCEDURE TRAY) ×3 IMPLANT
SYR MEDRAD MARK V 150ML (SYRINGE) ×3 IMPLANT
TRANSDUCER W/STOPCOCK (MISCELLANEOUS) ×3 IMPLANT
TUBING CIL FLEX 10 FLL-RA (TUBING) ×3 IMPLANT
WIRE SAFE-T 1.5MM-J .035X260CM (WIRE) ×3 IMPLANT

## 2014-07-28 NOTE — Progress Notes (Signed)
Pt resting in bed and had 14 beats of wide QRS and went back into sinus brady.Pt denied any CP or SOB. Md on call made aware. No new orders received. Will cont to monitor pt.

## 2014-07-28 NOTE — Progress Notes (Signed)
UR Completed Kela Baccari Graves-Bigelow, RN,BSN [phone]  

## 2014-07-28 NOTE — Progress Notes (Signed)
Patient Name: [name] Date of Encounter: 07/28/2014  Primary Cardiologist: Dr Jenkins Rouge   Principal Problem:   Unstable angina Active Problems:   CAD S/P percutaneous coronary angioplasty   Hyperlipidemia with target LDL less than 100   Hypertension   Ischemic cardiomyopathy    SUBJECTIVE  Denies any chest pain or SOB.   CURRENT MEDS . aspirin  81 mg Oral Daily  . carvedilol  3.125 mg Oral BID WC  . clopidogrel  75 mg Oral Daily  . lisinopril  20 mg Oral Daily  . omega-3 acid ethyl esters  1 g Oral Daily  . pantoprazole  40 mg Oral Daily  . polyethylene glycol  17 g Oral Daily  . sodium chloride  3 mL Intravenous Q12H  . vitamin C  500 mg Oral Daily   OBJECTIVE  Filed Vitals:   07/27/14 1730 07/27/14 1800 07/27/14 2000 07/28/14 0524  BP: 109/66 122/65 137/70 126/59  Pulse: 52 50 72 51  Temp:   98.2 F (36.8 C) 98.1 F (36.7 C)  TempSrc:   Oral Oral  Resp: 13 17 18 18   Height:  5' 6.93" (1.7 m) 5\' 7"  (1.702 m)   Weight:  196 lb 13.9 oz (89.3 kg) 188 lb 8 oz (85.503 kg) 188 lb 8 oz (85.503 kg)  SpO2: 99% 98% 98% 98%   No intake or output data in the 24 hours ending 07/28/14 0757 Filed Weights   07/27/14 1800 07/27/14 2000 07/28/14 0524  Weight: 196 lb 13.9 oz (89.3 kg) 188 lb 8 oz (85.503 kg) 188 lb 8 oz (85.503 kg)    PHYSICAL EXAM  General: Pleasant, NAD. Neuro: Alert and oriented X 3. Moves all extremities spontaneously. Psych: Normal affect. HEENT:  Normal  Neck: Supple without bruits or JVD. Lungs:  Resp regular and unlabored, CTA. Heart: RRR no s3, s4, or murmurs. Abdomen: Soft, non-tender, non-distended, BS + x 4.  Extremities: No clubbing, cyanosis or edema. DP/PT/Radials 2+ and equal bilaterally.  Accessory Clinical Findings  CBC  Recent Labs  07/27/14 1536 07/28/14 0140  WBC 4.9 6.0  HGB 13.7 13.7  HCT 40.2 40.3  MCV 92.6 92.9  PLT 217 299   Basic Metabolic Panel  Recent Labs  07/27/14 1536  NA 137  K 4.8  CL 103    CO2 26  GLUCOSE 122*  BUN 16  CREATININE 1.27*  CALCIUM 9.9   Liver Function Tests No results for input(s): AST, ALT, ALKPHOS, BILITOT, PROT, ALBUMIN in the last 72 hours. No results for input(s): LIPASE, AMYLASE in the last 72 hours. Cardiac Enzymes  Recent Labs  07/28/14 0140  TROPONINI <0.03   TELE Sinus brady with low voltage    ECG  Sinus bradycardia   Radiology/Studies  Dg Chest Port 1 View  07/27/2014   CLINICAL DATA:  Chest pain for 3 days.  EXAM: PORTABLE CHEST - 1 VIEW  COMPARISON:  03/24/2014  FINDINGS: Mid thoracic spondylosis. Upper normal heart size. Mild tortuosity of the thoracic aorta.  The lungs appear clear.  IMPRESSION: 1. No active cardiopulmonary disease is radiographically apparent. 2. Mild thoracic spondylosis.   Electronically Signed   By: Van Clines M.D.   On: 07/27/2014 15:56   Cardiac cath in 2014:  Left anterior descending (LAD): There is extensive stenting of the proximal LAD. The stents are widely patent. Distal to the second stent there is 30-40% disease in the mid LAD. The first diagonal is without significant disease.  Left circumflex (LCx):  The left circumflex gives rise to 2 large marginal branches. There is 30% disease in the proximal left circumflex. The mid vessel has a stent that is widely patent. The first marginal branch has 20-30% disease proximally.  Right coronary artery (RCA): The right coronary is a large dominant vessel. There is a stent in the proximal vessel which is widely patent. It has less than 20% disease. The mid and distal right coronary are are irregular with disease up to 20%.  ASSESSMENT AND PLAN  1. Unstable angina  - serial trop negative. EKG poor R wave progression unchanged from previous EKG  - continue ASA, coreg, lisinopril, and plavix. On lovaza due to intolerance to statin  - pending cardiac catheterization today, benefit and risk of the procedure include bleeding, vascular injury, kidney injury,  MI, stroke related complication has been explained, patient display clear understanding and agree to proceed.  2. CAD s/p stents to LAD, LCxs and later RCA  3. H/o ?mural apical thrombus, f/u MRI 2013 no thrombus noted  4. HTN 5. HLD: intolerant to statin, need PCSK 9 inhibitor referall 6. Intolerant to statins   Signed, Woodward Ku Pager: [phone]   The patient was seen, examined and discussed with Almyra Deforest, PA-C and I agree with the above.   The patient is awaiting cardiac cath, we will follow, if no obstructive disease, we will arrange for a lipid clinic follow up for PCSK 9 inhibitor initiation. Troponin negative x 3, ECG unchanged, BP is controlled.  Dorothy Spark 07/28/2014

## 2014-07-28 NOTE — Progress Notes (Signed)
TR band removed.  RT radial level 0.  No immediate complications noted.  Pressure dsg applied and pt advised to leave in place for 24 hours.  Radial site care reviewed verbally with pt and wife at bedside.  Vitals stable.  Will continue to monitor.

## 2014-07-28 NOTE — Progress Notes (Signed)
ANTICOAGULATION CONSULT NOTE - Follow Up Consult  Pharmacy Consult for Heparin Indication: chest pain/ACS  Allergies  Allergen Reactions  . Penicillins Anaphylaxis  . Statins Other (See Comments)    Pt tolerates lovastatin as a home medication without difficulties.    Patient Measurements: Height: 5\' 7"  (170.2 cm) Weight: 188 lb 8 oz (85.503 kg) IBW/kg (Calculated) : 66.1 Heparin Dosing Weight: 84 kg  Vital Signs: Temp: 98.1 F (36.7 C) (06/03 0524) Temp Source: Oral (06/03 0524) BP: 137/68 mmHg (06/03 0949) Pulse Rate: 62 (06/03 0949)  Labs:  Recent Labs  07/27/14 1536 07/28/14 0140 07/28/14 0705 07/28/14 0915  HGB 13.7 13.7  --   --   HCT 40.2 40.3  --   --   PLT 217 203  --   --   LABPROT  --  16.0*  --   --   INR  --  1.27  --   --   HEPARINUNFRC  --  0.41  --  0.76*  CREATININE 1.27*  --   --   --   TROPONINI  --  <0.03 <0.03  --     Estimated Creatinine Clearance: 57.4 mL/min (by C-G formula based on Cr of 1.27).  Assessment: CC: CP  PMH: CAD, HLD, HTN, CM, angina, OA, GERD, CHF, anxiety  AC: Heparin for CP, [place] - CBC stable. Heparin level 0.41>0.76 slightly high.  Cards: h/o CAD, HLD, HTN, ICM: ASA81, Coreg, Plavix,lisinopril,  Lovaza   Goal of Therapy:  Heparin level 0.3-0.7 units/ml Monitor platelets by anticoagulation protocol: Yes   Plan:  Decrease heparin to 1100 units/hr Will f/u after cath   Jaedon Siler S. Alford Highland, PharmD, BCPS Clinical Staff Pharmacist Pager [phone]  Eilene Ghazi Stillinger 07/28/2014,11:13 AM

## 2014-07-28 NOTE — Progress Notes (Signed)
ANTICOAGULATION CONSULT NOTE - Follow Up Consult  Pharmacy Consult for heparin Indication: chest pain/ACS   Labs:  Recent Labs  07/27/14 1536 07/28/14 0140  HGB 13.7 13.7  HCT 40.2 40.3  PLT 217 203  HEPARINUNFRC  --  0.41  CREATININE 1.27*  --      Assessment/Plan:  69yo male therapeutic on heparin with initial dosing for CP. Will continue gtt at current rate and confirm stable with additional level.   Wynona Neat, PharmD, BCPS  07/28/2014,2:53 AM

## 2014-07-28 NOTE — H&P (View-Only) (Signed)
CARDIOLOGY CONSULT NOTE   Patient ID: [name] MRN: [id number], DOB/AGE: 69 years   Admit date: 07/27/2014 Date of Consult: 07/27/2014  Primary Physician: Velna Hatchet, MD Primary Cardiologist: Dr Jenkins Rouge  Reason for consult:  Chest pain  Problem List  Past Medical History  Diagnosis Date  . Coronary artery disease     Pt with 4 stents over the course of 2003-2012  . Hypercholesterolemia   . Hypertension   . Elevated LFTs   . Myocardial infarction   . Ischemic cardiomyopathy   . Angina   . Arthritis   . GERD (gastroesophageal reflux disease)   . CHF (congestive heart failure)   . Heart murmur   . Shortness of breath   . Anxiety     Past Surgical History  Procedure Laterality Date  . Coronary angioplasty with stent placement      stents x4  . Cosmetic surgery    . Cardiac catheterization  06/11/2012    Nonobstructive CAD, patent stents, EF 50%, mild apical dyskinesis  . Left heart catheterization with coronary angiogram N/A 06/11/2012    Procedure: LEFT HEART CATHETERIZATION WITH CORONARY ANGIOGRAM;  Surgeon: Peter M Martinique, MD;  Location: [facility] CATH LAB;  Service: Cardiovascular;  Laterality: N/A;     Allergies  Allergies  Allergen Reactions  . Penicillins Anaphylaxis  . Statins Other (See Comments)    Pt tolerates lovastatin as a home medication without difficulties.    HPI   69 y.o. followed by Dr Johnsie Cancel, previously followed by Dr Gwenlyn Found. History of CAD, LAD and LCX stents with anterior MI.  In 7/12 he had IMI with ruptured plaque. Stents in LCX and LAD patent. Got another stent to RCA. EF 40% Note made of mural apical thrombus but no coumadin given.  Cath 06/10/12 with EF 50% and patent stents  F/U MRI 2013 EF 48% No AICD needed  Impression: 1) Moderate LVE EF 48% with distal anterior, septal, apical and inferoapical dyskinesia 2) No mural apical thrombus 3) Full thickness scar involving the distal anterior wall, apex and septum 4) MR  should be looked at further with echo 5) Mild LAE  In ER 03/24/14 with chest and abdominal pain r/o ? URI CXR bronchitis No issues last 2 months Not taking nitro, he was last seen by Dr Johnsie Cancel on 06/02/2014 and was chest pain free.   He presented to the ER today with left sided chest pressures that started two days ago, gets worse on exertion, progressively worsened and now radiates to his left arm, relieved by NTG. He describes that this is the same type of pain as before his MIs in the past. He denies diaphoresis, nausea, vomiting, fever, SOB, dizziness.  He has been complaint with his meds, however he was not on statins as they cause him significant muscle pain, LFTs elevation with inability to walk.    Cardiac cath in 2014:  Left anterior descending (LAD): There is extensive stenting of the proximal LAD. The stents are widely patent. Distal to the second stent there is 30-40% disease in the mid LAD. The first diagonal is without significant disease.  Left circumflex (LCx): The left circumflex gives rise to 2 large marginal branches. There is 30% disease in the proximal left circumflex. The mid vessel has a stent that is widely patent. The first marginal branch has 20-30% disease proximally.  Right coronary artery (RCA): The right coronary is a large dominant vessel. There is a stent in the proximal vessel which is widely  patent. It has less than 20% disease. The mid and distal right coronary are are irregular with disease up to 20%.  Current outpatient prescriptions:  .  aspirin 325 MG EC tablet, Take 325 mg by mouth daily. , Disp: , Rfl:  .  carvedilol (COREG) 3.125 MG tablet, Take 1 tablet (3.125 mg total) by mouth 2 (two) times daily with a meal., Disp: 60 tablet, Rfl: 3 .  clopidogrel (PLAVIX) 75 MG tablet, Take 1 tablet (75 mg total) by mouth daily., Disp: 30 tablet, Rfl: 3 .  lisinopril (PRINIVIL,ZESTRIL) 20 MG tablet, Take 1 tablet (20 mg total) by mouth daily., Disp: 30 tablet, Rfl:  11 .  nitroGLYCERIN (NITROSTAT) 0.4 MG SL tablet, Place 0.4 mg under the tongue every 5 (five) minutes as needed for chest pain. For a max of 3 doses. If no relief, call 911., Disp: , Rfl:  .  omega-3 acid ethyl esters (LOVAZA) 1 G capsule, Take 1 g by mouth daily., Disp: , Rfl:  .  pantoprazole (PROTONIX) 40 MG tablet, Take 1 tablet (40 mg total) by mouth daily., Disp: 30 tablet, Rfl: 11 .  zolpidem (AMBIEN) 10 MG tablet, Take 1 tablet (10 mg total) by mouth at bedtime as needed. For sleep, Disp: 30 tablet, Rfl: 5 .  oxyCODONE-acetaminophen (PERCOCET/ROXICET) 5-325 MG per tablet, Take 1 tablet by mouth every 4 (four) hours as needed for severe pain. May take 2 tablets PO q 6 hours for severe pain - Do not take with Tylenol as this tablet already contains tylenol, Disp: 15 tablet, Rfl: 0 .  predniSONE (DELTASONE) 20 MG tablet, Take 2 tablets (40 mg total) by mouth daily., Disp: 10 tablet, Rfl: 0 .  [DISCONTINUED] spironolactone (ALDACTONE) 25 MG tablet, Take 25 mg by mouth daily. , Disp: , Rfl:   Family History Family History  Problem Relation Age of Onset  . Coronary artery disease Mother   . Coronary artery disease Father     Social History History   Social History  . Marital Status: Divorced    Spouse Name: N/A  . Number of Children: N/A  . Years of Education: N/A   Occupational History  . Not on file.   Social History Main Topics  . Smoking status: Former Smoker -- 0.05 packs/day for 15 years    Types: Cigarettes  . Smokeless tobacco: Never Used  . Alcohol Use: 0.6 oz/week    1 Glasses of wine per week     Comment: 1 glass of wine per week  . Drug Use: No  . Sexual Activity: Yes   Other Topics Concern  . Not on file   Social History Narrative     Review of Systems  General:  No chills, fever, night sweats or weight changes.  Cardiovascular:  No chest pain, dyspnea on exertion, edema, orthopnea, palpitations, paroxysmal nocturnal dyspnea. Dermatological: No rash,  lesions/masses Respiratory: No cough, dyspnea Urologic: No hematuria, dysuria Abdominal:   No nausea, vomiting, diarrhea, bright red blood per rectum, melena, or hematemesis Neurologic:  No visual changes, wkns, changes in mental status. All other systems reviewed and are otherwise negative except as noted above.  Physical Exam  Blood pressure 109/72, pulse 70, temperature 98.4 F (36.9 C), temperature source Oral, resp. rate 20, SpO2 98 %.  General: Pleasant, NAD Psych: Normal affect. Neuro: Alert and oriented X 3. Moves all extremities spontaneously. HEENT: Normal  Neck: Supple without bruits or JVD. Lungs:  Resp regular and unlabored, CTA. Heart: RRR no s3, s4,  or murmurs. Abdomen: Soft, non-tender, non-distended, BS + x 4.  Extremities: No clubbing, cyanosis or edema. DP/PT/Radials 2+ and equal bilaterally.  Labs  No results for input(s): CKTOTAL, CKMB, TROPONINI in the last 72 hours. Lab Results  Component Value Date   WBC 4.9 07/27/2014   HGB 13.7 07/27/2014   HCT 40.2 07/27/2014   MCV 92.6 07/27/2014   PLT 217 07/27/2014    Recent Labs Lab 07/27/14 1536  NA 137  K 4.8  CL 103  CO2 26  BUN 16  CREATININE 1.27*  CALCIUM 9.9  GLUCOSE 122*   Lab Results  Component Value Date   CHOL 180 06/11/2012   HDL 34* 06/11/2012   LDLCALC 73 06/11/2012   TRIG 367* 06/11/2012   Radiology/Studies  Dg Chest Port 1 View  07/27/2014   CLINICAL DATA:  Chest pain for 3 days.  EXAM: PORTABLE CHEST - 1 VIEW  COMPARISON:  03/24/2014  FINDINGS: Mid thoracic spondylosis. Upper normal heart size. Mild tortuosity of the thoracic aorta.  The lungs appear clear.  IMPRESSION: 1. No active cardiopulmonary disease is radiographically apparent. 2. Mild thoracic spondylosis.   Electronically Signed   By: Van Clines M.D.   On: 07/27/2014 15:56   Echocardiogram - none  ECG: SR, prior inferior and anterolateral MI, unchanged from 03/25/2014     ASSESSMENT AND PLAN  1. CAD,  typical chest pain - suspicious for unstable angina - we will start iv heparin and schedule for a cath tomorrow.  ECG unchanged from prior. The first troponin is negative.  Continue ASA 81 mg po daily, plavix, carvedilol, lisinopril, no statin  2. Hypertension - controlled  3. Hyperlipidemia - with severe TAG elevation on omega 3 acids only - the patient will need to be referred to the lipid clinic for PCSK 9 inhibitors    Signed, Dorothy Spark, MD, [facility] 07/27/2014, 5:38 PM

## 2014-07-28 NOTE — Interval H&P Note (Signed)
Cath Lab Visit (complete for each Cath Lab visit)  Clinical Evaluation Leading to the Procedure:   ACS: No.  Non-ACS:    Anginal Classification: CCS III  Anti-ischemic medical therapy: Maximal Therapy (2 or more classes of medications)  Non-Invasive Test Results: No non-invasive testing performed  Prior CABG: No previous CABG      History and Physical Interval Note:  07/28/2014 1:16 PM  [name]  has presented today for surgery, with the diagnosis of cp  The various methods of treatment have been discussed with the patient and family. After consideration of risks, benefits and other options for treatment, the patient has consented to  Procedure(s): Left Heart Cath and Coronary Angiography (N/A) as a surgical intervention .  The patient's history has been reviewed, patient examined, no change in status, stable for surgery.  I have reviewed the patient's chart and labs.  Questions were answered to the patient's satisfaction.     Javed Cotto A

## 2014-07-29 LAB — CBC
HCT: 38.2 % — ABNORMAL LOW (ref 39.0–52.0)
Hemoglobin: 13 g/dL (ref 13.0–17.0)
MCH: 31.3 pg (ref 26.0–34.0)
MCHC: 34 g/dL (ref 30.0–36.0)
MCV: 92 fL (ref 78.0–100.0)
PLATELETS: 182 10*3/uL (ref 150–400)
RBC: 4.15 MIL/uL — ABNORMAL LOW (ref 4.22–5.81)
RDW: 12.9 % (ref 11.5–15.5)
WBC: 4.9 10*3/uL (ref 4.0–10.5)

## 2014-07-29 NOTE — Progress Notes (Signed)
Pt ambulated 450 feet with no distress, CP, or SOB. Pt verbalized understanding of discharge instructions, VSS, all belongings returned.

## 2014-07-29 NOTE — Discharge Summary (Signed)
Physician Discharge Summary  Patient ID: [name] MRN: [id number] DOB/AGE: Jun 30, 1945 69 y.o.   Primary Cardiologist: Dr. Johnsie Cancel  Admit date: 07/27/2014 Discharge date: 07/29/2014  Admission Diagnoses: Unstable Angina  Discharge Diagnoses:  Principal Problem:   Unstable angina Active Problems:   CAD S/P percutaneous coronary angioplasty   Hyperlipidemia with target LDL less than 100   Hypertension   Ischemic cardiomyopathy   Pain in the chest   Discharged Condition: stable  Hospital Course: the patient is a 69 y/o male followed by Dr. Johnsie Cancel, who presented to [facility] 07/[facility]/14 with complaints of chest pain. He has known CAD with h/o previous anterior MI and LAD and LCX stents. In July 2012 he had an MI with ruptured plaque. Stents in LCX and LAD patent. Got another stent to RCA. EF was 40%. He was also noted to have mural apical thrombus but no coumadin given. He underwent repeat LHC 06/10/12 which showed patent stents and improvement in EF to 50%.  When he presented 07/27/14, he noted chest pain concerning for unstable angina. Cardiac enzymes were cycled and negative x 3. However, given his history, repeat LHC was recommended. This was performed by Dr. Claiborne Billings. The LHC demonstrated coronary artery disease with patent tandem proximal to mid LAD stents with smooth 20% in-stent narrowing in the distal aspect of the stent; patent left circumflex stent with evidence for 50-60% proximal narrowing in a small marginal branch which arises with a upper takeoff from this stented segment; and widely patent RCA stent in a dominant RCA system.  EF was normal at 50-55% with evidence for an akinetic/dyskinetic aneurysmal apex. Continued medical therapy was recommended. He left the cath lab in stable condition. He was continued on ASA, Plavix, Coreg, lisinopril and Lovaza. He had no recurrent CP and no post cath complications. He was last seen and examined by Dr. Wynonia Lawman who determined he was stable for  discharge home. Post hospital f/u will be arranged with Dr. Johnsie Cancel.    Consults: None  Significant Diagnostic Studies: . Cardiac Panel (last 3 results)  Recent Labs  07/28/14 0140 07/28/14 0705 07/28/14 1600  TROPONINI <0.03 <0.03 <0.03   LHC 07/27/13 Coronary Findings    Dominance: Right   Left Anterior Descending   . Ost LAD to Dist LAD lesion, 20% stenosed. The lesion was previously treated with a stent (unknown type) .     Left Circumflex   . Prox Cx to Mid Cx lesion, 0% stenosed. located at bend. Previously placed Prox Cx to Mid Cx stent (unknown type) is patent.   . First Obtuse Marginal Branch   The vessel is small in size.   Colon Flattery 1st Mrg to 1st Mrg lesion, 50% stenosed. diffuse .     Right Coronary Artery   . Ost RCA to Mid RCA lesion, 0% stenosed. Previously placed Ost RCA to Mid RCA stent (unknown type) is patent.         Treatments: See Hospital Course  Discharge Exam: Blood pressure 134/66, pulse 50, temperature 97.9 F (36.6 C), temperature source Oral, resp. rate 14, height 5\' 7"  (1.702 m), weight 182 lb 3.2 oz (82.645 kg), SpO2 99 %.   Disposition: 01-Home or Self Care      Discharge Instructions    Diet - low sodium heart healthy    Complete by:  As directed      Increase activity slowly    Complete by:  As directed  Medication List    TAKE these medications        ALIGN 4 MG Caps  Take 4 mg by mouth daily.     aspirin 325 MG EC tablet  Take 325 mg by mouth daily.     carvedilol 3.125 MG tablet  Commonly known as:  COREG  Take 1 tablet (3.125 mg total) by mouth 2 (two) times daily with a meal.     clopidogrel 75 MG tablet  Commonly known as:  PLAVIX  Take 1 tablet (75 mg total) by mouth daily.     dicyclomine 20 MG tablet  Commonly known as:  BENTYL  Take 1 tablet (20 mg total) by mouth 2 (two) times daily.     guaiFENesin-codeine 100-10 MG/5ML syrup  Commonly known as:  ROBITUSSIN AC  Take 5-10 mLs by mouth  3 (three) times daily as needed for cough.     lisinopril 20 MG tablet  Commonly known as:  PRINIVIL,ZESTRIL  Take 1 tablet (20 mg total) by mouth daily.     LORazepam 0.5 MG tablet  Commonly known as:  ATIVAN  Take 1 tablet (0.5 mg total) by mouth every 8 (eight) hours as needed. For anxiety     nitroGLYCERIN 0.4 MG SL tablet  Commonly known as:  NITROSTAT  Place 0.4 mg under the tongue every 5 (five) minutes as needed for chest pain. For a max of 3 doses. If no relief, call 911.     omega-3 acid ethyl esters 1 G capsule  Commonly known as:  LOVAZA  Take 1 g by mouth daily.     oxyCODONE-acetaminophen 5-325 MG per tablet  Commonly known as:  PERCOCET/ROXICET  Take 1 tablet by mouth every 4 (four) hours as needed for severe pain. May take 2 tablets PO q 6 hours for severe pain - Do not take with Tylenol as this tablet already contains tylenol     pantoprazole 40 MG tablet  Commonly known as:  PROTONIX  Take 1 tablet (40 mg total) by mouth daily.     polyethylene glycol packet  Commonly known as:  MIRALAX / GLYCOLAX  Take 17 g by mouth daily.     predniSONE 20 MG tablet  Commonly known as:  DELTASONE  Take 2 tablets (40 mg total) by mouth daily.     Turmeric Curcumin 500 MG Caps  Take 500 mg by mouth daily.     Vitamin B-12 5000 MCG Lozg  Take 5,000 Units by mouth daily.     vitamin C 500 MG tablet  Commonly known as:  ASCORBIC ACID  Take 500 mg by mouth daily.     zolpidem 10 MG tablet  Commonly known as:  AMBIEN  Take 1 tablet (10 mg total) by mouth at bedtime as needed. For sleep       Follow-up Information    Follow up with Jenkins Rouge, MD.   Specialty:  Cardiology   Why:  our office will call you with a follow-up appointment   Contact information:   [address] [address] [phone]       TIME SPENT ON DISCHARGE, INCLUDING PHYSICIAN TIME: >30 MINUTES  Signed: Lyda Jester 07/29/2014, 10:38 AM

## 2014-07-29 NOTE — Progress Notes (Signed)
Pt left discharge instructions in room, denied being able to come back and retrieve them. RN will mail them to patient. There were no new medications and no medication changes on instructions. Pt was to contacted by physician office for follow up appointment.

## 2014-07-29 NOTE — Progress Notes (Signed)
Subjective:  No chest pain overnight.  Catheterization results from yesterday noted.  Objective:  Vital Signs in the last 24 hours: BP 134/66 mmHg  Pulse 50  Temp(Src) 97.9 F (36.6 C) (Oral)  Resp 14  Ht 5\' 7"  (1.702 m)  Wt 82.645 kg (182 lb 3.2 oz)  BMI 28.53 kg/m2  SpO2 99%  Physical Exam: Pleasant bearded male in no acute distress Lungs:  Clear Cardiac:  Regular rhythm, normal S1 and S2, no S3 Extremities:   radial catheterization site with slight oozing following removal of and dig.  Band-Aid applied.  Vessels intact.  Intake/Output from previous day: 06/03 0701 - 06/04 0700 In: 472.5 [I.V.:472.5] Out: 415 [Urine:415]  Weight Filed Weights   07/27/14 2000 07/28/14 0524 07/29/14 0500  Weight: 85.503 kg (188 lb 8 oz) 85.503 kg (188 lb 8 oz) 82.645 kg (182 lb 3.2 oz)    Lab Results: Basic Metabolic Panel:  Recent Labs  07/27/14 1536  NA 137  K 4.8  CL 103  CO2 26  GLUCOSE 122*  BUN 16  CREATININE 1.27*   CBC:  Recent Labs  07/28/14 0140 07/29/14 0404  WBC 6.0 4.9  HGB 13.7 13.0  HCT 40.3 38.2*  MCV 92.9 92.0  PLT 203 182   Cardiac Enzymes: Troponin (Point of Care Test)  Recent Labs  07/27/14 1542  TROPIPOC 0.00   Cardiac Panel (last 3 results)  Recent Labs  07/28/14 0140 07/28/14 0705 07/28/14 1600  TROPONINI <0.03 <0.03 <0.03    Telemetry: Sinus rhythm  Assessment/Plan:  1.  Coronary artery disease with previous stenting with patent stents noted yesterday.  Medical therapy was recommended. 2.  Low normal LV ejection fraction  Recommendations:  Okay for discharge today.      Kerry Hough  MD [facility] Card[facility]  07/29/2014, 8:02 AM

## 2014-07-31 ENCOUNTER — Encounter (HOSPITAL_COMMUNITY): Payer: Self-pay | Admitting: Cardiovascular Disease

## 2014-08-01 ENCOUNTER — Encounter: Payer: Self-pay | Admitting: Cardiovascular Disease

## 2014-08-01 MED FILL — Lidocaine HCl Local Preservative Free (PF) Inj 1%: INTRAMUSCULAR | Qty: 30 | Status: AC

## 2014-08-01 MED FILL — Heparin Sodium (Porcine) 2 Unit/ML in Sodium Chloride 0.9%: INTRAMUSCULAR | Qty: 1500 | Status: AC

## 2014-09-06 ENCOUNTER — Ambulatory Visit (INDEPENDENT_AMBULATORY_CARE_PROVIDER_SITE_OTHER): Payer: Commercial Managed Care - HMO | Admitting: Physician Assistant

## 2014-09-06 ENCOUNTER — Encounter: Payer: Self-pay | Admitting: Physician Assistant

## 2014-09-06 VITALS — BP 101/60 | HR 56 | Ht 67.0 in | Wt 189.0 lb

## 2014-09-06 DIAGNOSIS — I10 Essential (primary) hypertension: Secondary | ICD-10-CM

## 2014-09-06 DIAGNOSIS — I2510 Atherosclerotic heart disease of native coronary artery without angina pectoris: Secondary | ICD-10-CM

## 2014-09-06 DIAGNOSIS — E785 Hyperlipidemia, unspecified: Secondary | ICD-10-CM | POA: Diagnosis not present

## 2014-09-06 DIAGNOSIS — I255 Ischemic cardiomyopathy: Secondary | ICD-10-CM

## 2014-09-06 MED ORDER — ASPIRIN EC 81 MG PO TBEC: 81.0000 mg | DELAYED_RELEASE_TABLET | Freq: Every day | ORAL | Status: DC

## 2014-09-06 NOTE — Progress Notes (Signed)
Cardiology Office Note   Date:  09/06/2014   ID:  [name], DOB [birth year], MRN [id number]  PCP:  Velna Hatchet, MD  Cardiologist:  Dr. Jenkins Rouge     Chief Complaint  Patient presents with  . Hospitalization Follow-up    admitted with CP >> LHC with patent stents  . Coronary Artery Disease     History of Present Illness: [name] is a 69 y.o. male with a hx of CAD s/p anterior MI with stents to the LAD and LCx in 08/2010 in [place].  EF was 40% at that time with apparent mural apical thrombus but no coumadin was given.  MI in 08/2010 with stent placed to RCA.  LHC in 4/14 with patent stents.  MRI in 2013 with EF 48% and ICD needed.  Last seen by Dr. Jenkins Rouge in 05/2014.    Admitted 6/2-6/4 with chest pain concerning for [place].  CEs were neg x 3.  LHC was arranged and demonstrated patent stents.  Med Rx was continued.   He is doing well since DC from the hospital.  The patient denies chest pain, shortness of breath, syncope, orthopnea, PND or significant pedal edema.     Studies/Reports Reviewed Today:  LHC 07/28/14 LAD:  Stent patent, 20% ISR LCx:  Stent patent OM1:  50% RCA:  Stent patent Low normal LV function with an ejection fraction of 50-55% with evidence for an akinetic/dyskinetic aneurysmal apex. Coronary artery disease with patent tandem proximal to mid LAD stents with smooth 20% in-stent narrowing in the distal aspect of the stent; patent left circumflex stent with evidence for 50-60% proximal narrowing in a small marginal branch which arises with aupper takeoff from this stented segment; and widely patent RCA stent in a dominant RCA system. RECOMMENDATION:  Medical therapy.  Cardiac MRI 03/2011 Impression: 1) Moderate LVE EF 48% with distal anterior, septal, apical and inferoapical dyskinesia 2) No mural apical thrombus 3) Full thickness scar involving the distal anterior wall, apex and septum 4) MR should be looked at further with  echo 5) Mild LAE   Past Medical History  Diagnosis Date  . Coronary artery disease     a.  Pt with 4 stents over the course of 2003-2012;  b.  LHC 6/16:  LAD, RCA, LCx stents patent, OM1 50%, EF 50-55% >> med rx   . Hypercholesterolemia   . Hypertension   . Elevated LFTs   . Ischemic cardiomyopathy     cMRI 03/2011:  EF 48% with dist ant, septal, apical and inf-apical dyskinesia, no apical clot, dist ant, apical and septal full thickness scar >> No ICD needed   . GERD (gastroesophageal reflux disease)   . Anxiety   . Myocardial infarction 2003  . Arthritis     "wrists" (07/27/2014)    Past Surgical History  Procedure Laterality Date  . Cosmetic surgery Left 1970's    "dog ripped of my ear"  . Left heart catheterization with coronary angiogram N/A 06/11/2012    Procedure: LEFT HEART CATHETERIZATION WITH CORONARY ANGIOGRAM;  Surgeon: Peter M Martinique, MD;  Location: [facility] CATH LAB;  Service: Cardiovascular;  Laterality: N/A;  . Coronary angioplasty with stent placement  2003-2012    total of 4 stents place  . Cardiac catheterization  06/11/2012    Nonobstructive CAD, patent stents, EF 50%, mild apical dyskinesis  . Cardiac catheterization N/A 07/28/2014    Procedure: Left Heart Cath and Coronary Angiography;  Surgeon: Troy Sine, MD;  Location: [facility]  INVASIVE CV LAB;  Service: Cardiovascular;  Laterality: N/A;     Current Outpatient Prescriptions  Medication Sig Dispense Refill  . aspirin 81 MG tablet Take 1 tablet (81 mg total) by mouth daily.    . carvedilol (COREG) 3.125 MG tablet Take 1 tablet (3.125 mg total) by mouth 2 (two) times daily with a meal. 60 tablet 3  . clopidogrel (PLAVIX) 75 MG tablet Take 1 tablet (75 mg total) by mouth daily. 30 tablet 3  . Cyanocobalamin (VITAMIN B-12) 5000 MCG LOZG Take 5,000 Units by mouth daily.    Marland Kitchen dicyclomine (BENTYL) 20 MG tablet Take 20 mg by mouth 2 (two) times daily.    Marland Kitchen lisinopril (PRINIVIL,ZESTRIL) 20 MG tablet Take 1 tablet (20 mg  total) by mouth daily. 30 tablet 11  . LORazepam (ATIVAN) 0.5 MG tablet Take 1 tablet (0.5 mg total) by mouth every 8 (eight) hours as needed. For anxiety 60 tablet 5  . nitroGLYCERIN (NITROSTAT) 0.4 MG SL tablet Place 0.4 mg under the tongue every 5 (five) minutes as needed for chest pain. For a max of 3 doses. If no relief, call 911.    . omega-3 acid ethyl esters (LOVAZA) 1 G capsule Take 1 g by mouth daily.    Marland Kitchen oxyCODONE-acetaminophen (PERCOCET/ROXICET) 5-325 MG per tablet Take 1 tablet by mouth every 4 (four) hours as needed for severe pain. May take 2 tablets PO q 6 hours for severe pain - Do not take with Tylenol as this tablet already contains tylenol 15 tablet 0  . pantoprazole (PROTONIX) 40 MG tablet Take 1 tablet (40 mg total) by mouth daily. 30 tablet 11  . polyethylene glycol (MIRALAX / GLYCOLAX) packet Take 17 g by mouth daily.     . predniSONE (DELTASONE) 20 MG tablet Take 2 tablets (40 mg total) by mouth daily. 10 tablet 0  . Probiotic Product (ALIGN) 4 MG CAPS Take 4 mg by mouth daily.     . vitamin C (ASCORBIC ACID) 500 MG tablet Take 500 mg by mouth daily.     Marland Kitchen zolpidem (AMBIEN) 10 MG tablet Take 1 tablet (10 mg total) by mouth at bedtime as needed. For sleep 30 tablet 5  . [DISCONTINUED] spironolactone (ALDACTONE) 25 MG tablet Take 25 mg by mouth daily.      No current facility-administered medications for this visit.    Allergies:   Penicillins and Statins    Social History:  The patient  reports that he has quit smoking. His smoking use included Cigarettes. He has a 15 pack-year smoking history. He has never used smokeless tobacco. He reports that he drinks about 1.2 oz of alcohol per week. He reports that he does not use illicit drugs.   Family History:  The patient's family history includes Coronary artery disease in his father and mother.    ROS:   Please see the history of present illness.   Review of Systems  Cardiovascular: Positive for chest pain.    Gastrointestinal: Positive for constipation.  All other systems reviewed and are negative.     PHYSICAL EXAM: VS:  BP 101/60 mmHg  Pulse 56  Ht 5\' 7"  (1.702 m)  Wt 189 lb (85.73 kg)  BMI 29.59 kg/m2    Wt Readings from Last 3 Encounters:  09/06/14 189 lb (85.73 kg)  07/29/14 182 lb 3.2 oz (82.645 kg)  06/02/14 196 lb 12.8 oz (89.268 kg)     GEN: Well nourished, well developed, in no acute distress HEENT: normal  Neck: no JVD, no masses Cardiac:  Normal S1/S2, RRR; no murmur, no rubs or gallops, no edema; right wrist without hematoma or mass  Respiratory:  clear to auscultation bilaterally, no wheezing, rhonchi or rales. GI: soft, nontender, nondistended, + BS MS: no deformity or atrophy Skin: warm and dry  Neuro:  CNs II-XII intact, Strength and sensation are intact Psych: Normal affect   EKG:  EKG is ordered today.  It demonstrates:   Sinus brady, HR 56, PRWP, low voltage, no change from prior tracing   Recent Labs: 10/22/2013: ALT 53 07/27/2014: BUN 16; Creatinine, Ser 1.27*; Potassium 4.8; Sodium 137 07/29/2014: Hemoglobin 13.0; Platelets 182    Lipid Panel    Component Value Date/Time   CHOL 180 06/11/2012 0234   TRIG 367* 06/11/2012 0234   HDL 34* 06/11/2012 0234   CHOLHDL 5.3 06/11/2012 0234   VLDL 73* 06/11/2012 0234   LDLCALC 73 06/11/2012 0234      ASSESSMENT AND PLAN:  Coronary artery disease involving native coronary artery of native heart without angina pectoris:  No further angina.  LHC recently with patent stents.  Continue ASA, Plavix, beta-blocker.    Ischemic cardiomyopathy:  EF by MRI in 2013 48% and 50-55% by recent LHC.  Continue current dose of beta-blocker, ACE inhibitor.  Hyperlipidemia with target LDL less than 100:  Statin intol.  He is not interested in seeing the Lipid Clinic to discuss PCSK-9 inhibitors.    Essential hypertension:  Controlled.      Medication Changes: Current medicines are reviewed at length with the patient  today.  Concerns regarding medicines are as outlined above.  The following changes have been made:   Discontinued Medications   ANDROGEL PUMP 20.25 MG/ACT (1.62%) GEL    APPLY 1 PUMP TO UPPER ARM ONCE DAILY AS DIRECTED   DICYCLOMINE (BENTYL) 20 MG TABLET    Take 1 tablet (20 mg total) by mouth 2 (two) times daily.   GUAIFENESIN-CODEINE (ROBITUSSIN AC) 100-10 MG/5ML SYRUP    Take 5-10 mLs by mouth 3 (three) times daily as needed for cough.   TURMERIC CURCUMIN 500 MG CAPS    Take 500 mg by mouth daily.    Modified Medications   Modified Medication Previous Medication   ASPIRIN 81 MG TABLET aspirin 325 MG EC tablet      Take 1 tablet (81 mg total) by mouth daily.    Take 325 mg by mouth daily.    New Prescriptions   No medications on file    Labs/ tests ordered today include:   Orders Placed This Encounter  Procedures  . EKG 12-Lead     Disposition:   FU with Dr. Jenkins Rouge 6 mos   Signed, Versie Starks, MHS 09/06/2014 5:03 PM    [facility] [address], [place], [place]  [address] Phone: [phone]; Fax: [phone]

## 2014-09-06 NOTE — Patient Instructions (Addendum)
Medication Instructions:  1. DECREASE ASPIRIN TO 81 MG DAILY  Labwork: NONE  Testing/Procedures: NONE  Follow-Up: Your physician wants you to follow-up in: [place]. Johnsie Cancel.  You will receive a reminder letter in the mail two months in advance. If you don't receive a letter, please call our office to schedule the follow-up appointment.   Any Other Special Instructions Will Be Listed Below (If Applicable).

## 2014-12-19 DIAGNOSIS — H25043 Posterior subcapsular polar age-related cataract, bilateral: Secondary | ICD-10-CM | POA: Diagnosis not present

## 2015-01-04 DIAGNOSIS — Z6829 Body mass index (BMI) 29.0-29.9, adult: Secondary | ICD-10-CM | POA: Diagnosis not present

## 2015-01-04 DIAGNOSIS — E8809 Other disorders of plasma-protein metabolism, not elsewhere classified: Secondary | ICD-10-CM | POA: Diagnosis not present

## 2015-01-04 DIAGNOSIS — H9193 Unspecified hearing loss, bilateral: Secondary | ICD-10-CM | POA: Diagnosis not present

## 2015-01-04 DIAGNOSIS — K5730 Diverticulosis of large intestine without perforation or abscess without bleeding: Secondary | ICD-10-CM | POA: Diagnosis not present

## 2015-01-04 DIAGNOSIS — H9190 Unspecified hearing loss, unspecified ear: Secondary | ICD-10-CM | POA: Insufficient documentation

## 2015-01-04 DIAGNOSIS — K5792 Diverticulitis of intestine, part unspecified, without perforation or abscess without bleeding: Secondary | ICD-10-CM | POA: Diagnosis not present

## 2015-01-04 DIAGNOSIS — L821 Other seborrheic keratosis: Secondary | ICD-10-CM | POA: Diagnosis not present

## 2015-01-04 DIAGNOSIS — H6122 Impacted cerumen, left ear: Secondary | ICD-10-CM | POA: Diagnosis not present

## 2015-01-11 DIAGNOSIS — L570 Actinic keratosis: Secondary | ICD-10-CM | POA: Diagnosis not present

## 2015-01-11 DIAGNOSIS — L820 Inflamed seborrheic keratosis: Secondary | ICD-10-CM | POA: Diagnosis not present

## 2015-01-11 DIAGNOSIS — L814 Other melanin hyperpigmentation: Secondary | ICD-10-CM | POA: Diagnosis not present

## 2015-01-11 DIAGNOSIS — D1801 Hemangioma of skin and subcutaneous tissue: Secondary | ICD-10-CM | POA: Diagnosis not present

## 2015-01-11 DIAGNOSIS — L821 Other seborrheic keratosis: Secondary | ICD-10-CM | POA: Diagnosis not present

## 2015-02-01 DIAGNOSIS — H9313 Tinnitus, bilateral: Secondary | ICD-10-CM | POA: Diagnosis not present

## 2015-02-01 DIAGNOSIS — H903 Sensorineural hearing loss, bilateral: Secondary | ICD-10-CM | POA: Diagnosis not present

## 2015-05-22 ENCOUNTER — Encounter: Payer: Self-pay | Admitting: Cardiovascular Disease

## 2015-05-22 DIAGNOSIS — Z125 Encounter for screening for malignant neoplasm of prostate: Secondary | ICD-10-CM | POA: Diagnosis not present

## 2015-05-22 DIAGNOSIS — E784 Other hyperlipidemia: Secondary | ICD-10-CM | POA: Diagnosis not present

## 2015-05-22 DIAGNOSIS — R8299 Other abnormal findings in urine: Secondary | ICD-10-CM | POA: Diagnosis not present

## 2015-05-22 DIAGNOSIS — R7309 Other abnormal glucose: Secondary | ICD-10-CM | POA: Diagnosis not present

## 2015-05-30 DIAGNOSIS — Z1212 Encounter for screening for malignant neoplasm of rectum: Secondary | ICD-10-CM | POA: Diagnosis not present

## 2015-06-04 DIAGNOSIS — E784 Other hyperlipidemia: Secondary | ICD-10-CM | POA: Diagnosis not present

## 2015-06-04 DIAGNOSIS — I130 Hypertensive heart and chronic kidney disease with heart failure and stage 1 through stage 4 chronic kidney disease, or unspecified chronic kidney disease: Secondary | ICD-10-CM | POA: Diagnosis not present

## 2015-06-04 DIAGNOSIS — L600 Ingrowing nail: Secondary | ICD-10-CM | POA: Diagnosis not present

## 2015-06-04 DIAGNOSIS — R3129 Other microscopic hematuria: Secondary | ICD-10-CM | POA: Diagnosis not present

## 2015-06-04 DIAGNOSIS — Z125 Encounter for screening for malignant neoplasm of prostate: Secondary | ICD-10-CM | POA: Diagnosis not present

## 2015-06-04 DIAGNOSIS — E291 Testicular hypofunction: Secondary | ICD-10-CM | POA: Diagnosis not present

## 2015-06-04 DIAGNOSIS — Z6829 Body mass index (BMI) 29.0-29.9, adult: Secondary | ICD-10-CM | POA: Diagnosis not present

## 2015-06-04 DIAGNOSIS — K219 Gastro-esophageal reflux disease without esophagitis: Secondary | ICD-10-CM | POA: Insufficient documentation

## 2015-06-04 DIAGNOSIS — Z0000 Encounter for general adult medical examination without abnormal findings: Secondary | ICD-10-CM | POA: Diagnosis not present

## 2015-07-03 ENCOUNTER — Ambulatory Visit: Payer: Commercial Managed Care - HMO | Admitting: Cardiovascular Disease

## 2015-07-14 NOTE — Progress Notes (Signed)
Patient ID: [name], male   DOB: Jul 02, 1945, 70 y.o.   MRN: [id number]    Cardiology Office Note   Date:  07/17/2015   ID:  [name], DOB 1945/12/21, MRN [id number]  PCP:  Velna Hatchet, MD  Cardiologist:  Dr. Jenkins Rouge     Chief Complaint  Patient presents with  . Coronary Artery Disease  . Cardiomyopathy     History of Present Illness: [name] is a 70 y.o. male with a hx of CAD s/p anterior MI with stents to the LAD and LCx in 08/2010 in [place].  EF was 40% at that time with apparent mural apical thrombus but no coumadin was given.  MI in 08/2010 with stent placed to RCA.  LHC in 4/14 with patent stents.  MRI in 2013 with EF 48% and no  ICD needed.  Last seen by me  in 05/2014.    Admitted 6/2-6/4 /16 with chest pain concerning for [place].  CEs were neg x 3.  LHC was arranged and demonstrated patent stents.  Med Rx was continued.   He is doing well since DC from the hospital.  The patient denies chest pain, shortness of breath, syncope, orthopnea, PND or significant pedal edema.     Studies/Reports Reviewed Today:  LHC 07/28/14 LAD:  Stent patent, 20% ISR LCx:  Stent patent OM1:  50% RCA:  Stent patent Low normal LV function with an ejection fraction of 50-55% with evidence for an akinetic/dyskinetic aneurysmal apex. Coronary artery disease with patent tandem proximal to mid LAD stents with smooth 20% in-stent narrowing in the distal aspect of the stent; patent left circumflex stent with evidence for 50-60% proximal narrowing in a small marginal branch which arises with aupper takeoff from this stented segment; and widely patent RCA stent in a dominant RCA system. RECOMMENDATION:  Medical therapy.  Cardiac MRI 03/2011 Impression: 1) Moderate LVE EF 48% with distal anterior, septal, apical and inferoapical dyskinesia 2) No mural apical thrombus 3) Full thickness scar involving the distal anterior wall, apex and septum 4) MR should be looked at  further with echo 5) Mild LAE   Using Vital Reds as a supplement LDL 114 on review of [facility] labs. Intolerant to all statins "cant move"  As far as I know Has not been on zetia.   Just got a house in [facility] neighborhood   Past Medical History  Diagnosis Date  . Coronary artery disease     a.  Pt with 4 stents over the course of 2003-2012;  b.  LHC 6/16:  LAD, RCA, LCx stents patent, OM1 50%, EF 50-55% >> med rx   . Hypercholesterolemia   . Hypertension   . Elevated LFTs   . Ischemic cardiomyopathy     cMRI 03/2011:  EF 48% with dist ant, septal, apical and inf-apical dyskinesia, no apical clot, dist ant, apical and septal full thickness scar >> No ICD needed   . GERD (gastroesophageal reflux disease)   . Anxiety   . Myocardial infarction ([place]) 2003  . Arthritis     "wrists" (07/27/2014)    Past Surgical History  Procedure Laterality Date  . Cosmetic surgery Left 1970's    "dog ripped of my ear"  . Left heart catheterization with coronary angiogram N/A 06/11/2012    Procedure: LEFT HEART CATHETERIZATION WITH CORONARY ANGIOGRAM;  Surgeon: Blakley Michna M Martinique, MD;  Location: [facility] CATH LAB;  Service: Cardiovascular;  Laterality: N/A;  . Coronary angioplasty with stent placement  2003-2012  total of 4 stents place  . Cardiac catheterization  06/11/2012    Nonobstructive CAD, patent stents, EF 50%, mild apical dyskinesis  . Cardiac catheterization N/A 07/28/2014    Procedure: Left Heart Cath and Coronary Angiography;  Surgeon: Troy Sine, MD;  Location: [facility] CV LAB;  Service: Cardiovascular;  Laterality: N/A;     Current Outpatient Prescriptions  Medication Sig Dispense Refill  . aspirin 81 MG tablet Take 1 tablet (81 mg total) by mouth daily.    . carvedilol (COREG) 3.125 MG tablet Take 1 tablet (3.125 mg total) by mouth 2 (two) times daily with a meal. 60 tablet 3  . clopidogrel (PLAVIX) 75 MG tablet Take 1 tablet (75 mg total) by mouth daily. 30 tablet 3  .  Cyanocobalamin (VITAMIN B-12) 5000 MCG LOZG Take 5,000 Units by mouth daily.    Marland Kitchen dicyclomine (BENTYL) 20 MG tablet Take 20 mg by mouth 2 (two) times daily.    Marland Kitchen lisinopril (PRINIVIL,ZESTRIL) 20 MG tablet Take 1 tablet (20 mg total) by mouth daily. 30 tablet 11  . LORazepam (ATIVAN) 0.5 MG tablet Take 1 tablet (0.5 mg total) by mouth every 8 (eight) hours as needed. For anxiety 60 tablet 5  . nitroGLYCERIN (NITROSTAT) 0.4 MG SL tablet Place 0.4 mg under the tongue every 5 (five) minutes as needed for chest pain. For a max of 3 doses. If no relief, call 911.    . omega-3 acid ethyl esters (LOVAZA) 1 G capsule Take 1 g by mouth daily.    . pantoprazole (PROTONIX) 40 MG tablet Take 1 tablet (40 mg total) by mouth daily. 30 tablet 11  . polyethylene glycol (MIRALAX / GLYCOLAX) packet Take 17 g by mouth daily.     . Probiotic Product (ALIGN) 4 MG CAPS Take 4 mg by mouth daily.     . vitamin C (ASCORBIC ACID) 500 MG tablet Take 500 mg by mouth daily.     Marland Kitchen zolpidem (AMBIEN) 10 MG tablet Take 1 tablet (10 mg total) by mouth at bedtime as needed. For sleep 30 tablet 5  . [DISCONTINUED] spironolactone (ALDACTONE) 25 MG tablet Take 25 mg by mouth daily.      No current facility-administered medications for this visit.    Allergies:   Penicillins and Statins    Social History:  The patient  reports that he has quit smoking. His smoking use included Cigarettes. He has a 15 pack-year smoking history. He has never used smokeless tobacco. He reports that he drinks about 1.2 oz of alcohol per week. He reports that he does not use illicit drugs.   Family History:  The patient's family history includes Coronary artery disease in his father and mother.    ROS:   Please see the history of present illness.   Review of Systems  Cardiovascular: Positive for chest pain.  Gastrointestinal: Positive for constipation.  All other systems reviewed and are negative.     PHYSICAL EXAM: VS:  BP 102/60 mmHg  Pulse  60  Ht 5\' 7"  (1.702 m)  Wt 83.915 kg (185 lb)  BMI 28.97 kg/m2    Wt Readings from Last 3 Encounters:  07/17/15 83.915 kg (185 lb)  09/06/14 85.73 kg (189 lb)  07/29/14 82.645 kg (182 lb 3.2 oz)     GEN: Well nourished, well developed, in no acute distress HEENT: normal Neck: no JVD, no masses Cardiac:  Normal S1/S2, RRR; no murmur, no rubs or gallops, no edema; right wrist without hematoma  or mass  Respiratory:  clear to auscultation bilaterally, no wheezing, rhonchi or rales. GI: soft, nontender, nondistended, + BS MS: no deformity or atrophy Skin: warm and dry  Neuro:  CNs II-XII intact, Strength and sensation are intact Psych: Normal affect   EKG:    09/06/14   Sinus brady, HR 56, PRWP, low voltage, no change from prior tracing 07/17/15  SR ate 38 old anterolateral MI    Recent Labs: 07/27/2014: BUN 16; Creatinine, Ser 1.27*; Potassium 4.8; Sodium 137 07/29/2014: Hemoglobin 13.0; Platelets 182    Lipid Panel    Component Value Date/Time   CHOL 180 06/11/2012 0234   TRIG 367* 06/11/2012 0234   HDL 34* 06/11/2012 0234   CHOLHDL 5.3 06/11/2012 0234   VLDL 73* 06/11/2012 0234   LDLCALC 73 06/11/2012 0234      ASSESSMENT AND PLAN:  Coronary artery disease involving native coronary artery of native heart without angina pectoris:  No further angina.  LHC 07/2014  with patent stents.  Continue ASA, Plavix, beta-blocker.    Ischemic cardiomyopathy:  EF by MRI in 2013 48% and 50-55% by recent LHC.  Continue current dose of beta-blocker, ACE inhibitor.  Hyperlipidemia with target LDL less than 100:  Statin intol.  Will not take shots so PSK-9 drugs not an option try zetia and check  Labs in 3 months f/u lipid clinic    Essential hypertension:  Controlled.      Medication Changes: Current medicines are reviewed at length with the patient today.  Concerns regarding medicines are as outlined above.  The following changes have been made:   Discontinued Medications    OXYCODONE-ACETAMINOPHEN (PERCOCET/ROXICET) 5-325 MG PER TABLET    Take 1 tablet by mouth every 4 (four) hours as needed for severe pain. May take 2 tablets PO q 6 hours for severe pain - Do not take with Tylenol as this tablet already contains tylenol   PREDNISONE (DELTASONE) 20 MG TABLET    Take 2 tablets (40 mg total) by mouth daily.   Modified Medications   No medications on file   New Prescriptions   No medications on file    Labs/ tests ordered today include:   No orders of the defined types were placed in this encounter.     Disposition:   FU with me  6 mos   Jenkins Rouge

## 2015-07-16 ENCOUNTER — Encounter: Payer: Self-pay | Admitting: Sports Medicine

## 2015-07-16 ENCOUNTER — Ambulatory Visit (INDEPENDENT_AMBULATORY_CARE_PROVIDER_SITE_OTHER): Payer: Commercial Managed Care - HMO | Admitting: Sports Medicine

## 2015-07-16 VITALS — BP 152/80 | HR 76 | Resp 12

## 2015-07-16 DIAGNOSIS — B351 Tinea unguium: Secondary | ICD-10-CM | POA: Diagnosis not present

## 2015-07-16 DIAGNOSIS — L600 Ingrowing nail: Secondary | ICD-10-CM | POA: Diagnosis not present

## 2015-07-16 DIAGNOSIS — L603 Nail dystrophy: Secondary | ICD-10-CM | POA: Diagnosis not present

## 2015-07-16 DIAGNOSIS — M79675 Pain in left toe(s): Secondary | ICD-10-CM | POA: Diagnosis not present

## 2015-07-16 DIAGNOSIS — G8929 Other chronic pain: Secondary | ICD-10-CM

## 2015-07-16 NOTE — Progress Notes (Signed)
Patient ID: [name], male   DOB: Jun 26, 1945, 70 y.o.   MRN: [id number] Subjective: [name] is a 70 y.o. male patient presents to office today complaining of a painful incurvated, left great toenail at the lateral margin that has been ongoing >20 years. Patient has treated this by trimming. Had a bad experience in [place] with a doctor doing nail procedure in the past. Denies any drainage or swelling. Patient denies fever/chills/nausea/vomitting/any other related constitutional symptoms at this time.  Patient Active Problem List   Diagnosis Date Noted  . Pain in the chest   . Low testosterone 06/02/2014  . Unstable angina ([place]) 06/11/2012  . CAD S/P percutaneous coronary angioplasty 01/07/2011    Class: Chronic  . Angina pectoris 01/07/2011    Class: Acute  . Hyperlipidemia with target LDL less than 100 01/07/2011    Class: Chronic  . Hypertension 01/07/2011    Class: Chronic  . Ischemic cardiomyopathy 01/07/2011    Class: Chronic  . Previous myocardial infarction older than 8 weeks 01/07/2011    Class: History of    Current Outpatient Prescriptions on File Prior to Visit  Medication Sig Dispense Refill  . aspirin 81 MG tablet Take 1 tablet (81 mg total) by mouth daily.    . carvedilol (COREG) 3.125 MG tablet Take 1 tablet (3.125 mg total) by mouth 2 (two) times daily with a meal. 60 tablet 3  . clopidogrel (PLAVIX) 75 MG tablet Take 1 tablet (75 mg total) by mouth daily. 30 tablet 3  . Cyanocobalamin (VITAMIN B-12) 5000 MCG LOZG Take 5,000 Units by mouth daily.    Marland Kitchen dicyclomine (BENTYL) 20 MG tablet Take 20 mg by mouth 2 (two) times daily.    Marland Kitchen lisinopril (PRINIVIL,ZESTRIL) 20 MG tablet Take 1 tablet (20 mg total) by mouth daily. 30 tablet 11  . LORazepam (ATIVAN) 0.5 MG tablet Take 1 tablet (0.5 mg total) by mouth every 8 (eight) hours as needed. For anxiety 60 tablet 5  . nitroGLYCERIN (NITROSTAT) 0.4 MG SL tablet Place 0.4 mg under the tongue every 5 (five) minutes as  needed for chest pain. For a max of 3 doses. If no relief, call 911.    . omega-3 acid ethyl esters (LOVAZA) 1 G capsule Take 1 g by mouth daily.    Marland Kitchen oxyCODONE-acetaminophen (PERCOCET/ROXICET) 5-325 MG per tablet Take 1 tablet by mouth every 4 (four) hours as needed for severe pain. May take 2 tablets PO q 6 hours for severe pain - Do not take with Tylenol as this tablet already contains tylenol 15 tablet 0  . pantoprazole (PROTONIX) 40 MG tablet Take 1 tablet (40 mg total) by mouth daily. 30 tablet 11  . polyethylene glycol (MIRALAX / GLYCOLAX) packet Take 17 g by mouth daily.     . predniSONE (DELTASONE) 20 MG tablet Take 2 tablets (40 mg total) by mouth daily. 10 tablet 0  . Probiotic Product (ALIGN) 4 MG CAPS Take 4 mg by mouth daily.     . vitamin C (ASCORBIC ACID) 500 MG tablet Take 500 mg by mouth daily.     Marland Kitchen zolpidem (AMBIEN) 10 MG tablet Take 1 tablet (10 mg total) by mouth at bedtime as needed. For sleep 30 tablet 5  . [DISCONTINUED] spironolactone (ALDACTONE) 25 MG tablet Take 25 mg by mouth daily.      No current facility-administered medications on file prior to visit.    Allergies  Allergen Reactions  . Penicillins Anaphylaxis  . Statins Other (See  Comments)    Pt tolerates lovastatin as a home medication without difficulties.    Objective:  There were no vitals filed for this visit.  General: Well developed, nourished, in no acute distress, alert and oriented x3   Dermatology: Skin is warm, dry and supple bilateral.Left hallux nail appears to be  Severely thickened and moderately incurvated at the distal aspects of lateral nail border. (-) Erythema. (-) Edema. (-) serosanguous drainage present. The remaining nails appear unremarkable at this time. There are no open sores, lesions or other signs of infection present.  Vascular: Dorsalis Pedis artery and Posterior Tibial artery pedal pulses are 2/4 bilateral with immedate capillary fill time. Pedal hair growth present. No  lower extremity edema.   Neruologic: Grossly intact via light touch bilateral.  Musculoskeletal: Tenderness to palpation of the Left hallux lateral nail fold. Muscular strength within normal limits in all groups bilateral.   Assesement and Plan: Problem List Items Addressed This Visit    None    Visit Diagnoses    Onychodystrophy    -  Primary    Ingrown nail        Toe pain, chronic, left           -Discussed treatment alternatives and plan of care; Explained permanent/temporary nail avulsion and post procedure course to patient. Patient declined nail procedure at this time even though I recommend complete removal of nail due to thickness and incurvation  -Mechanically trimmed ingrowing nail margin removing the offending portion to the patient's tolerance for symptomatic relief using sterile nail nipper and applied topical antibiotic cream and bandaid -Advised patient to monitor and soak with Epsom salt and apply antibiotic cream and return to office if area worsens -Patient is to return as needed for follow up care or sooner if problems arise.  Landis Martins, DPM

## 2015-07-17 ENCOUNTER — Ambulatory Visit (INDEPENDENT_AMBULATORY_CARE_PROVIDER_SITE_OTHER): Payer: Commercial Managed Care - HMO | Admitting: Cardiovascular Disease

## 2015-07-17 ENCOUNTER — Encounter: Payer: Self-pay | Admitting: Cardiovascular Disease

## 2015-07-17 VITALS — BP 102/60 | HR 60 | Ht 67.0 in | Wt 185.0 lb

## 2015-07-17 DIAGNOSIS — I255 Ischemic cardiomyopathy: Secondary | ICD-10-CM

## 2015-07-17 DIAGNOSIS — Z79899 Other long term (current) drug therapy: Secondary | ICD-10-CM | POA: Diagnosis not present

## 2015-07-17 DIAGNOSIS — E785 Hyperlipidemia, unspecified: Secondary | ICD-10-CM

## 2015-07-17 DIAGNOSIS — I2510 Atherosclerotic heart disease of native coronary artery without angina pectoris: Secondary | ICD-10-CM

## 2015-07-17 MED ORDER — EZETIMIBE 10 MG PO TABS: 10.0000 mg | ORAL_TABLET | Freq: Every day | ORAL | Status: DC

## 2015-07-17 NOTE — Patient Instructions (Addendum)
Medication Instructions: Your physician has recommended you make the following change in your medication: START ZETIA  10 MG  EVERY DAY    Labwork: LIPID AND  LIVER IN  3  MONTHS  LATE  AUGUST   FASTING  Testing/Procedures: NONE Follow-Up: Your physician wants you to follow-up in: [place] will receive a reminder letter in the mail two months in advance. If you don't receive a letter, please call our office to schedule the follow-up appointment. You have been referred to LIPID CLINIC IN  3 MONTHS   Any Other Special Instructions Will Be Listed Below (If Applicable).     If you need a refill on your cardiac medications before your next appointment, please call your pharmacy.

## 2015-09-19 IMAGING — CR DG CHEST 1V PORT
1 series · 1 of 1 positions shown · non-contrast
Comparison: 03/24/2014

CLINICAL DATA: Chest pain for 3 days.

EXAM:
PORTABLE CHEST - 1 VIEW

[AP]
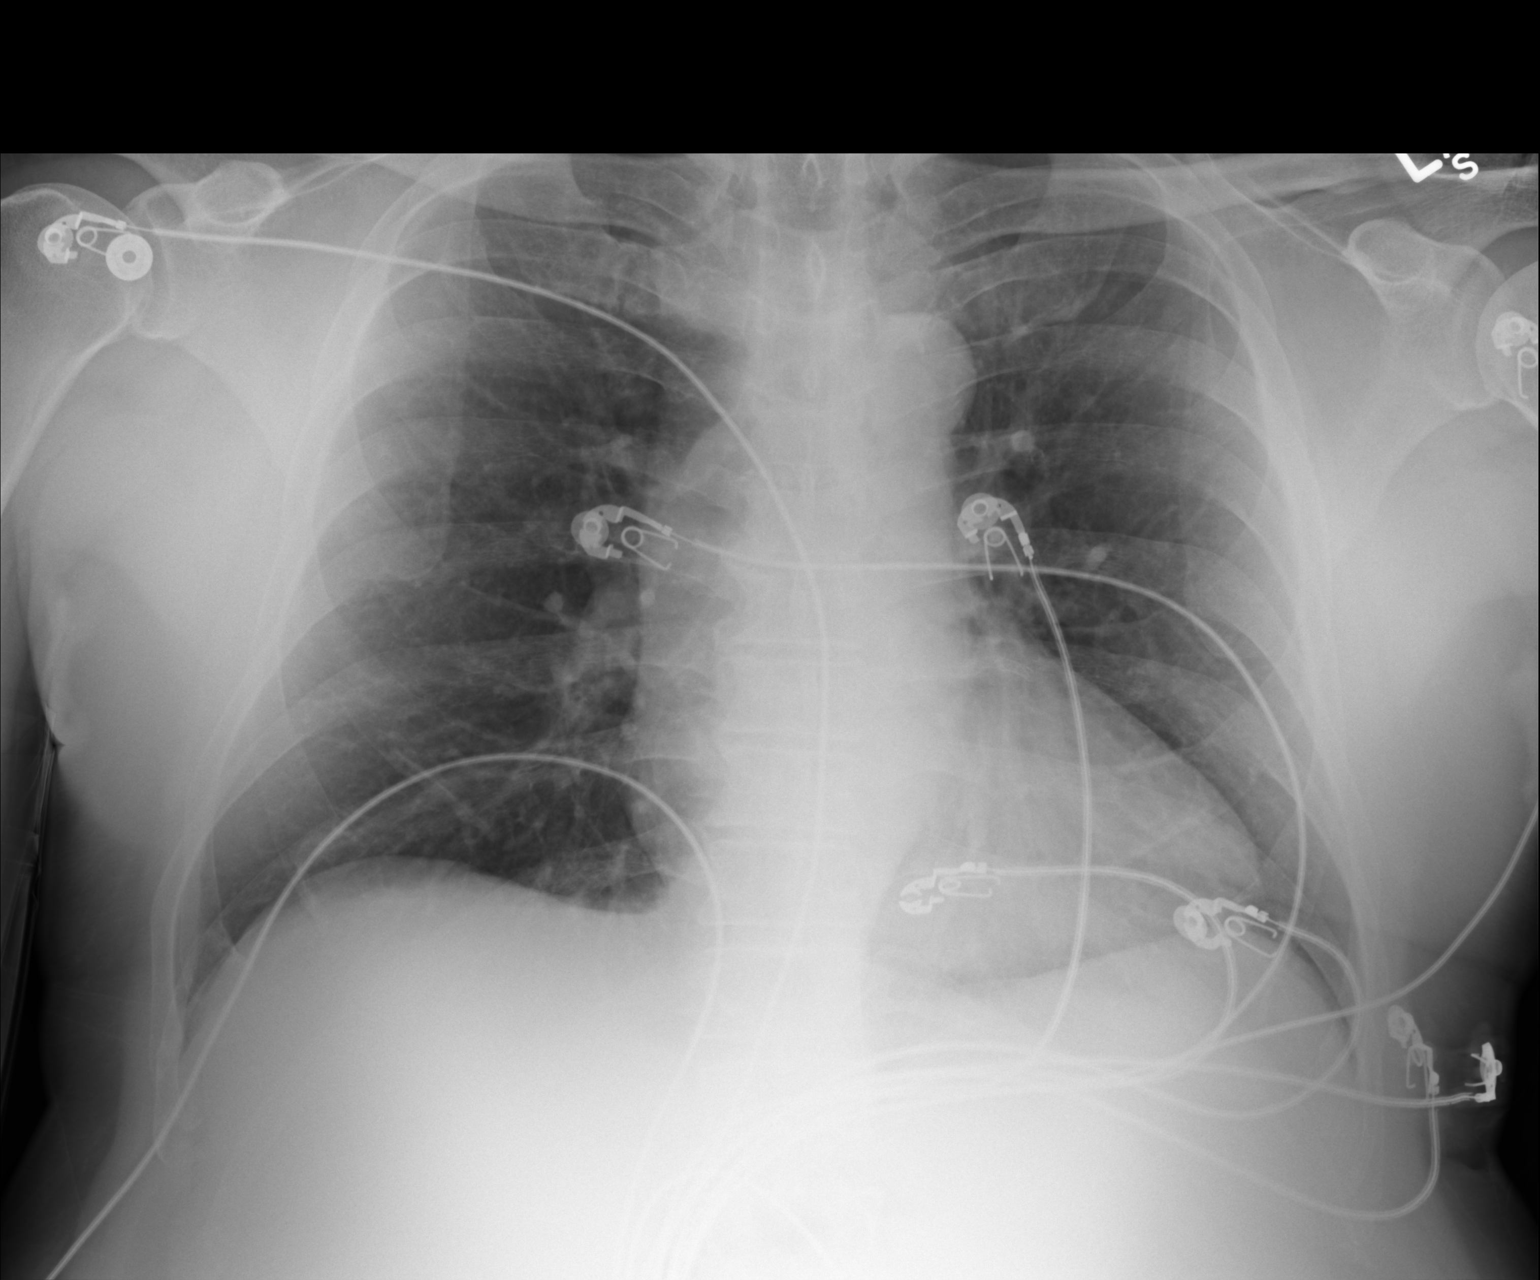

[1 of 1 positions shown; findings below may reference images not displayed]

FINDINGS: Mid thoracic spondylosis. Upper normal heart size. Mild tortuosity
of the thoracic aorta.

The lungs appear clear.
IMPRESSION: 1. No active cardiopulmonary disease is radiographically apparent.
2. Mild thoracic spondylosis.

## 2015-10-17 ENCOUNTER — Other Ambulatory Visit: Payer: Commercial Managed Care - HMO

## 2015-10-23 ENCOUNTER — Ambulatory Visit: Payer: Commercial Managed Care - HMO

## 2015-11-09 ENCOUNTER — Emergency Department (HOSPITAL_COMMUNITY): Payer: Commercial Managed Care - HMO

## 2015-11-09 ENCOUNTER — Encounter (HOSPITAL_COMMUNITY): Payer: Self-pay | Admitting: Emergency Medicine

## 2015-11-09 ENCOUNTER — Emergency Department (HOSPITAL_COMMUNITY)
Admission: EM | Admit: 2015-11-09 | Discharge: 2015-11-10 | Disposition: A | Discharge status: Home / Self Care | Payer: Commercial Managed Care - HMO | Attending: Emergency Medicine | Admitting: Emergency Medicine

## 2015-11-09 DIAGNOSIS — Z87891 Personal history of nicotine dependence: Secondary | ICD-10-CM | POA: Diagnosis not present

## 2015-11-09 DIAGNOSIS — Z79899 Other long term (current) drug therapy: Secondary | ICD-10-CM | POA: Insufficient documentation

## 2015-11-09 DIAGNOSIS — I252 Old myocardial infarction: Secondary | ICD-10-CM | POA: Diagnosis not present

## 2015-11-09 DIAGNOSIS — I2510 Atherosclerotic heart disease of native coronary artery without angina pectoris: Secondary | ICD-10-CM | POA: Insufficient documentation

## 2015-11-09 DIAGNOSIS — R0602 Shortness of breath: Secondary | ICD-10-CM | POA: Diagnosis not present

## 2015-11-09 DIAGNOSIS — R079 Chest pain, unspecified: Secondary | ICD-10-CM

## 2015-11-09 DIAGNOSIS — Z7982 Long term (current) use of aspirin: Secondary | ICD-10-CM | POA: Insufficient documentation

## 2015-11-09 DIAGNOSIS — R0789 Other chest pain: Secondary | ICD-10-CM | POA: Diagnosis not present

## 2015-11-09 LAB — BASIC METABOLIC PANEL
ANION GAP: 8 (ref 5–15)
BUN: 22 mg/dL — ABNORMAL HIGH (ref 6–20)
CO2: 24 mmol/L (ref 22–32)
Calcium: 9.6 mg/dL (ref 8.9–10.3)
Chloride: 104 mmol/L (ref 101–111)
Creatinine, Ser: 1.28 mg/dL — ABNORMAL HIGH (ref 0.61–1.24)
GFR calc non Af Amer: 55 mL/min — ABNORMAL LOW (ref >60)
Glucose, Bld: 115 mg/dL — ABNORMAL HIGH (ref 65–99)
Potassium: 4.3 mmol/L (ref 3.5–5.1)
SODIUM: 136 mmol/L (ref 135–145)

## 2015-11-09 LAB — I-STAT TROPONIN, ED: TROPONIN I, POC: 0.01 ng/mL (ref 0.00–0.08)

## 2015-11-09 LAB — CBC
HCT: 38.6 % — ABNORMAL LOW (ref 39.0–52.0)
HEMOGLOBIN: 12.7 g/dL — AB (ref 13.0–17.0)
MCH: 31.4 pg (ref 26.0–34.0)
MCHC: 32.9 g/dL (ref 30.0–36.0)
MCV: 95.3 fL (ref 78.0–100.0)
Platelets: 209 10*3/uL (ref 150–400)
RBC: 4.05 MIL/uL — ABNORMAL LOW (ref 4.22–5.81)
RDW: 12.8 % (ref 11.5–15.5)
WBC: 6.7 10*3/uL (ref 4.0–10.5)

## 2015-11-09 MED ORDER — GI COCKTAIL ~~LOC~~
30.0000 mL | Freq: Once | ORAL | Status: AC
  Administered 2015-11-09: 30 mL via ORAL
  Filled 2015-11-09: qty 30

## 2015-11-09 NOTE — ED Notes (Signed)
Report from [facility]> pt reports chest discomfort for over 1 week that is worse today.  Reports 6/10 L chest pressure since earlier today.  Took 2 home NTG (chewed) and Asa 324 mg prior to EMS arrival.  Denies nausea and vomiting.  Reports sob earlier today that resolved.  EMS administered 1 NTG SL- pain decreased to 2/10.

## 2015-11-09 NOTE — Consult Note (Addendum)
RFC: chest pain  70yoM with hx of HTN, HLD, CAD s/p multiple stents, anterior apical scar on cMRI with EF 48%, presents to ER with constant chest pressure >10 hours duration.  Patient states that for the past couple weeks he has had constant chest pressure, occasionally radiating to his L arm.  It is not associated with exertion.  He has been able to exert himself without limitation without exacerbating the pain.  He denies SOB and orthopnea.  Says his MI pressure was like an elephant sitting on his chest, and this is much less, currently a 3/10.  Tonight at 8pm he felt the constant pressure increase to a 6/10, so he took two NTG and called EMS. These NTG did not affect his chest pressure.  However, he did have 1 NTG in [place] that improved chest pressure to 3/10.  ROS: otherwise unremarkable, does have L arm pain while walking his dog.  No current facility-administered medications on file prior to encounter.    Current Outpatient Prescriptions on File Prior to Encounter  Medication Sig Dispense Refill  . aspirin 81 MG tablet Take 1 tablet (81 mg total) by mouth daily.    . carvedilol (COREG) 3.125 MG tablet Take 1 tablet (3.125 mg total) by mouth 2 (two) times daily with a meal. 60 tablet 3  . clopidogrel (PLAVIX) 75 MG tablet Take 1 tablet (75 mg total) by mouth daily. 30 tablet 3  . Cyanocobalamin (VITAMIN B-12) 5000 MCG LOZG Take 5,000 Units by mouth daily.    Marland Kitchen dicyclomine (BENTYL) 20 MG tablet Take 20 mg by mouth every morning.     Marland Kitchen lisinopril (PRINIVIL,ZESTRIL) 20 MG tablet Take 1 tablet (20 mg total) by mouth daily. 30 tablet 11  . LORazepam (ATIVAN) 0.5 MG tablet Take 1 tablet (0.5 mg total) by mouth every 8 (eight) hours as needed. For anxiety 60 tablet 5  . nitroGLYCERIN (NITROSTAT) 0.4 MG SL tablet Place 0.4 mg under the tongue every 5 (five) minutes as needed for chest pain. For a max of 3 doses. If no relief, call 911.    . omega-3 acid ethyl esters (LOVAZA) 1 G capsule Take  1 g by mouth daily.    . pantoprazole (PROTONIX) 40 MG tablet Take 1 tablet (40 mg total) by mouth daily. 30 tablet 11  . polyethylene glycol (MIRALAX / GLYCOLAX) packet Take 17 g by mouth daily.     . Probiotic Product (ALIGN) 4 MG CAPS Take 4 mg by mouth daily.     . vitamin C (ASCORBIC ACID) 500 MG tablet Take 500 mg by mouth daily.     Marland Kitchen zolpidem (AMBIEN) 10 MG tablet Take 1 tablet (10 mg total) by mouth at bedtime as needed. For sleep 30 tablet 5  . [DISCONTINUED] spironolactone (ALDACTONE) 25 MG tablet Take 25 mg by mouth daily.       Past Medical History:  Diagnosis Date  . Anxiety   . Arthritis    "wrists" (07/27/2014)  . Coronary artery disease    a.  Pt with 4 stents over the course of 2003-2012;  b.  LHC 6/16:  LAD, RCA, LCx stents patent, OM1 50%, EF 50-55% >> med rx   . Elevated LFTs   . GERD (gastroesophageal reflux disease)   . Hypercholesterolemia   . Hypertension   . Ischemic cardiomyopathy    cMRI 03/2011:  EF 48% with dist ant, septal, apical and inf-apical dyskinesia, no apical clot, dist ant, apical and septal full thickness  scar >> No ICD needed   . Myocardial infarction [facility]) 2003   Allergies  Allergen Reactions  . Penicillins Anaphylaxis  . Statins Other (See Comments)    Pt tolerates lovastatin as a home medication without difficulties.   BP 123/72   Pulse 71   Temp 98.7 F (37.1 C) (Oral)   Resp 21   SpO2 99%  Well appearing 70 yo in NAD JVP normal CTAB RRR s1 s2 no mrg  soft nt nd No edema AAO without focal deficit  6.7 >---------< 209           38.6  Cr 1.3, K 4.3  Trop 0.01  LHC 6/16: 20% LAD stent restenosis, 50% marginal, patent RCA stent  cMRI 2/13 - per HPI  ECG: 11/09/14 - loss of anterior forces in precordial leads, LAFB, NSR 72, unchanged from prior  CXR:  - no acute CP process  Impression 70yoM with extensive CAD hx presents with sustained chest pressure for >10 hours unreleated to exertion, that worsened this evening and  persists.  He meets 1/3 diamond and forrester criteria for non-cardiac chest discomfort.  However, his pretest probablity is much higher than most patients given his history of multiple stents.  He had a recent LHC in 6/16 that did not show signficant in-stent restenosis.  Furthermore, despite his long duration of symptoms, his enzymes are negative.    Consequently, this is low likelihood to be cardiac chest pain.  Recs: 1.  Would observe in ER by checking another troponin in 6 hours 2.  If negative would discharge home with instructions to follow up with his cardiologist in the next week 3.  Given improvement with chest pressure with NTG, please try GI cocktail  Terressa Koyanagi, MD

## 2015-11-09 NOTE — ED Provider Notes (Signed)
[place] DEPT Provider Note   CSN: [id number] Arrival date & time: 11/09/15  2022     History   Chief Complaint No chief complaint on file.   HPI [name] is a 70 y.o. male.  Patient with h/o of CAD s/p anterior MI with stents to the LAD and LCx in 08/2010, on Plavix, left heart cath in 07/2014 demonstrating patent stents, ischemic cardiomyopathy -- presents with mid chest pain and left shoulder pain. Patient states that for the past month he has had intermittent shoulder pain with associated mild chest pressure. He attributed this initially to walking his 70 pound [place] who tends to pull abruptly on its leash. Symptoms seem to be somewhat positional in nature but improved with rest. Today his symptoms or more pronounced and lasted longer. EMS was called and arrived to patient at 8 PM tonight. They gave 324mg  ASA and nitroglycerin which seemed to improve his symptoms somewhat. Now complains of only minimal pressure in chest. Patient had 2 nitroglycerin earlier in the day.  He had symptoms for several hours prior to EMS arrival. No diaphoresis, vomiting. Pain radiates at times to his left upper shoulder. No abdominal pain. States his symptoms today reminded him of MI he had in 2003.  Cardiologist is Dr. Johnsie Cancel.       Past Medical History:  Diagnosis Date  . Anxiety   . Arthritis    "wrists" (07/27/2014)  . Coronary artery disease    a.  Pt with 4 stents over the course of 2003-2012;  b.  LHC 6/16:  LAD, RCA, LCx stents patent, OM1 50%, EF 50-55% >> med rx   . Elevated LFTs   . GERD (gastroesophageal reflux disease)   . Hypercholesterolemia   . Hypertension   . Ischemic cardiomyopathy    cMRI 03/2011:  EF 48% with dist ant, septal, apical and inf-apical dyskinesia, no apical clot, dist ant, apical and septal full thickness scar >> No ICD needed   . Myocardial infarction [facility]) 2003    Patient Active Problem List   Diagnosis Date Noted  . Pain in the chest   .  Low testosterone 06/02/2014  . Unstable angina ([place]) 06/11/2012  . CAD S/P percutaneous coronary angioplasty 01/07/2011    Class: Chronic  . Angina pectoris 01/07/2011    Class: Acute  . Hyperlipidemia with target LDL less than 100 01/07/2011    Class: Chronic  . Hypertension 01/07/2011    Class: Chronic  . Ischemic cardiomyopathy 01/07/2011    Class: Chronic  . Previous myocardial infarction older than 8 weeks 01/07/2011    Class: History of    Past Surgical History:  Procedure Laterality Date  . CARDIAC CATHETERIZATION  06/11/2012   Nonobstructive CAD, patent stents, EF 50%, mild apical dyskinesis  . CARDIAC CATHETERIZATION N/A 07/28/2014   Procedure: Left Heart Cath and Coronary Angiography;  Surgeon: Troy Sine, MD;  Location: [place] CV LAB;  Service: Cardiovascular;  Laterality: N/A;  . CORONARY ANGIOPLASTY WITH STENT PLACEMENT  2003-2012   total of 4 stents place  . COSMETIC SURGERY Left 1970's   "dog ripped of my ear"  . LEFT HEART CATHETERIZATION WITH CORONARY ANGIOGRAM N/A 06/11/2012   Procedure: LEFT HEART CATHETERIZATION WITH CORONARY ANGIOGRAM;  Surgeon: Peter M Martinique, MD;  Location: [facility] CATH LAB;  Service: Cardiovascular;  Laterality: N/A;       Home Medications    Prior to Admission medications   Medication Sig Start Date End Date Taking? Authorizing Provider  aspirin 81 MG tablet Take 1 tablet (81 mg total) by mouth daily. 09/06/14   Liliane Shi, PA-C  carvedilol (COREG) 3.125 MG tablet Take 1 tablet (3.125 mg total) by mouth 2 (two) times daily with a meal. 11/29/13   Josue Hector, MD  clopidogrel (PLAVIX) 75 MG tablet Take 1 tablet (75 mg total) by mouth daily. 06/11/12   Roger A Arguello, PA-C  Cyanocobalamin (VITAMIN B-12) 5000 MCG LOZG Take 5,000 Units by mouth daily.    Historical Provider, MD  dicyclomine (BENTYL) 20 MG tablet Take 20 mg by mouth 2 (two) times daily.    Historical Provider, MD  ezetimibe (ZETIA) 10 MG tablet Take 1 tablet (10  mg total) by mouth daily. 07/17/15   Josue Hector, MD  lisinopril (PRINIVIL,ZESTRIL) 20 MG tablet Take 1 tablet (20 mg total) by mouth daily. 03/04/13   Josue Hector, MD  LORazepam (ATIVAN) 0.5 MG tablet Take 1 tablet (0.5 mg total) by mouth every 8 (eight) hours as needed. For anxiety 07/06/12   Josue Hector, MD  nitroGLYCERIN (NITROSTAT) 0.4 MG SL tablet Place 0.4 mg under the tongue every 5 (five) minutes as needed for chest pain. For a max of 3 doses. If no relief, call 911.    Historical Provider, MD  omega-3 acid ethyl esters (LOVAZA) 1 G capsule Take 1 g by mouth daily.    Historical Provider, MD  pantoprazole (PROTONIX) 40 MG tablet Take 1 tablet (40 mg total) by mouth daily. 04/26/12   Josue Hector, MD  polyethylene glycol [facility] / GLYCOLAX) packet Take 17 g by mouth daily.     Historical Provider, MD  Probiotic Product (ALIGN) 4 MG CAPS Take 4 mg by mouth daily.     Historical Provider, MD  vitamin C (ASCORBIC ACID) 500 MG tablet Take 500 mg by mouth daily.     Historical Provider, MD  zolpidem (AMBIEN) 10 MG tablet Take 1 tablet (10 mg total) by mouth at bedtime as needed. For sleep 07/06/12   Josue Hector, MD    Family History Family History  Problem Relation Age of Onset  . Coronary artery disease Mother   . Coronary artery disease Father     Social History Social History  Substance Use Topics  . Smoking status: Former Smoker    Packs/day: 1.00    Years: 15.00    Types: Cigarettes  . Smokeless tobacco: Never Used     Comment: "quit smoking cigarettes in 1979"  . Alcohol use 1.2 oz/week    2 Glasses of wine per week     Allergies   Penicillins and Statins   Review of Systems Review of Systems  Constitutional: Negative for diaphoresis and fever.  Eyes: Negative for redness.  Respiratory: Negative for cough and shortness of breath.   Cardiovascular: Positive for chest pain. Negative for palpitations and leg swelling.  Gastrointestinal: Negative for abdominal  pain, nausea and vomiting.  Genitourinary: Negative for dysuria.  Musculoskeletal: Positive for back pain and myalgias. Negative for neck pain.  Skin: Negative for rash.  Neurological: Negative for syncope and light-headedness.  Psychiatric/Behavioral: The patient is not nervous/anxious.      Physical Exam Updated Vital Signs BP 114/64   Pulse 68   Temp 98.7 F (37.1 C) (Oral)   Resp 26   SpO2 99%   Physical Exam  Constitutional: He appears well-developed and well-nourished.  HENT:  Head: Normocephalic and atraumatic.  Mouth/Throat: Mucous membranes are normal. Mucous membranes  are not dry.  Eyes: Conjunctivae are normal.  Neck: Trachea normal and normal range of motion. Neck supple. Normal carotid pulses and no JVD present. No muscular tenderness present. Carotid bruit is not present. No tracheal deviation present.  Cardiovascular: Normal rate, regular rhythm, S1 normal, S2 normal, normal heart sounds and intact distal pulses.  Exam reveals no distant heart sounds and no decreased pulses.   No murmur heard. Pulmonary/Chest: Effort normal and breath sounds normal. No respiratory distress. He has no wheezes. He exhibits no tenderness.  Abdominal: Soft. Normal aorta and bowel sounds are normal. There is no tenderness. There is no rebound and no guarding.  Musculoskeletal: He exhibits no edema.  Neurological: He is alert.  Skin: Skin is warm and dry. He is not diaphoretic. No cyanosis. No pallor.  Psychiatric: He has a normal mood and affect.  Nursing note and vitals reviewed.    ED Treatments / Results  Labs (all labs ordered are listed, but only abnormal results are displayed) Labs Reviewed  BASIC METABOLIC PANEL - Abnormal; Notable for the following:       Result Value   Glucose, Bld 115 (*)    BUN 22 (*)    Creatinine, Ser 1.28 (*)    GFR calc non Af Amer 55 (*)    All other components within normal limits  CBC - Abnormal; Notable for the following:    RBC 4.05 (*)      Hemoglobin 12.7 (*)    HCT 38.6 (*)    All other components within normal limits  I-STAT TROPOININ, ED    EKG  EKG Interpretation  Date/Time:  Friday November 09 2015 20:31:52 EDT Ventricular Rate:  72 PR Interval:    QRS Duration: 89 QT Interval:  355 QTC Calculation: 389 R Axis:   -91 Text Interpretation:  Sinus rhythm Left anterior fascicular block Probable right ventricular hypertrophy Anterolateral infarct, age indeterminate No significant change since last tracing Confirmed by Christy Gentles  MD, [place] ([id number]) on 11/09/2015 8:36:44 PM       Radiology Dg Chest 2 View  Result Date: 11/09/2015 CLINICAL DATA:  Acute onset of generalized chest pain and pressure. Shortness of breath. Initial encounter. EXAM: CHEST  2 VIEW COMPARISON:  Chest radiograph performed 07/27/2014 FINDINGS: The lungs are well-aerated and clear. There is no evidence of focal opacification, pleural effusion or pneumothorax. The heart is normal in size; the mediastinal contour is within normal limits. No acute osseous abnormalities are seen. IMPRESSION: No acute cardiopulmonary process seen. Electronically Signed   By: Garald Balding M.D.   On: 11/09/2015 21:20    Procedures Procedures (including critical care time)  Medications Ordered in ED Medications - No data to display   Initial Impression / Assessment and Plan / ED Course  I have reviewed the triage vital signs and the nursing notes.  Pertinent labs & imaging results that were available during my care of the patient were reviewed by me and considered in my medical decision making (see chart for details).  Clinical Course   Patient seen and examined. Work-up initiated. EKG reviewed.   Vital signs reviewed and are as follows: BP 114/64   Pulse 68   Temp 98.7 F (37.1 C) (Oral)   Resp 26   SpO2 99%   11:10 PM Discussed with Dr. Christy Gentles earlier. Agrees with cardiology consult.   Dr. Karlyn Agee, cardiology, has seen patient. Would like 6-hour  repeat trop/EKG to r/o. If that is okay, can be discharged with close f/u  with his cardiologist.   5:43 AM repeat EKG unchanged. Second troponin at 6 hours performed and is unchanged and in normal range. Patient has not had any recurrence of symptoms while in the emergency department.  As per discussion with cardiology earlier, patient to be discharged to home at this time. He is to follow-up closely with his cardiologist next week. Patient states that he is comfortable with discharge at this time.  Patient was counseled to return with severe chest pain, especially if the pain is crushing or pressure-like and spreads to the arms, back, neck, or jaw, or if they have sweating, nausea, or shortness of breath with the pain. They were encouraged to call 911 with these symptoms.   They were also told to return if their chest pain gets worse and does not go away with rest, they have an attack of chest pain lasting longer than usual despite rest and treatment with the medications their caregiver has prescribed, if they wake from sleep with chest pain or shortness of breath, if they feel dizzy or faint, if they have chest pain not typical of their usual pain, or if they have any other emergent concerns regarding their health.  The patient verbalized understanding and agreed.     Final Clinical Impressions(s) / ED Diagnoses   Final diagnoses:  Chest pain, unspecified chest pain type   Patient with chest pain, previous cardiac history as noted. Symptoms are now resolved. Patient has been seen by cardiology in emergency department. He has had EKGs which are unchanged from previous and unchanged during emergency department stay. Also 6 hour delta troponin is unchanged. Pain has not resumed. Patient has appropriate follow-up. Feel that he is safe for discharge to home at this time. Patient is compliant with his Plavix and had a reassuring cardiac catheterization 15 months ago. Given his presentation, low  concern for PE, dissection, aortic aneurysm. Patient appears well at time of discharge.  New Prescriptions New Prescriptions   No medications on file     Carlisle Cater, PA-C 11/10/15 0545    Ripley Fraise, MD 11/12/15 [phone]

## 2015-11-10 DIAGNOSIS — R079 Chest pain, unspecified: Secondary | ICD-10-CM | POA: Diagnosis not present

## 2015-11-10 LAB — I-STAT TROPONIN, ED: TROPONIN I, POC: 0.01 ng/mL (ref 0.00–0.08)

## 2015-11-10 NOTE — Discharge Instructions (Signed)
Please read and follow all provided instructions.  Your diagnoses today include:  1. Chest pain, unspecified chest pain type     Tests performed today include:  An EKG of your heart - no changes  A chest x-ray  Cardiac enzymes - a blood test for heart muscle damage, no sign of heart attack  Blood counts and electrolytes  Vital signs. See below for your results today.   Medications prescribed:   None  Take any prescribed medications only as directed.  Follow-up instructions: Please follow-up with your cardiologist next week for further evaluation of your symptoms.   Return instructions:  SEEK IMMEDIATE MEDICAL ATTENTION IF:  You have severe chest pain, especially if the pain is crushing or pressure-like and spreads to the arms, back, neck, or jaw, or if you have sweating, nausea (feeling sick to your stomach), or shortness of breath. THIS IS AN EMERGENCY. Don't wait to see if the pain will go away. Get medical help at once. Call 911 or 0 (operator). DO NOT drive yourself to the hospital.   Your chest pain gets worse and does not go away with rest.   You have an attack of chest pain lasting longer than usual, despite rest and treatment with the medications your caregiver has prescribed.   You wake from sleep with chest pain or shortness of breath.  You feel dizzy or faint.  You have chest pain not typical of your usual pain for which you originally saw your caregiver.   You have any other emergent concerns regarding your health.  Additional Information: Chest pain comes from many different causes. Your caregiver has diagnosed you as having chest pain that is not specific for one problem, but does not require admission.  You are at low risk for an acute heart condition or other serious illness.   Your vital signs today were: BP 124/76    Pulse (!) 47    Temp 98.7 F (37.1 C) (Oral)    Resp 10    SpO2 97%  If your blood pressure (BP) was elevated above 135/85 this visit,  please have this repeated by your doctor within one month. --------------

## 2015-11-10 NOTE — ED Notes (Signed)
Signature pad not working, pt verbalized understanding of discharge instructions 

## 2015-12-03 DIAGNOSIS — M25512 Pain in left shoulder: Secondary | ICD-10-CM | POA: Diagnosis not present

## 2015-12-03 DIAGNOSIS — E784 Other hyperlipidemia: Secondary | ICD-10-CM | POA: Diagnosis not present

## 2015-12-03 DIAGNOSIS — Z6829 Body mass index (BMI) 29.0-29.9, adult: Secondary | ICD-10-CM | POA: Diagnosis not present

## 2015-12-03 DIAGNOSIS — L600 Ingrowing nail: Secondary | ICD-10-CM | POA: Diagnosis not present

## 2015-12-03 DIAGNOSIS — K589 Irritable bowel syndrome without diarrhea: Secondary | ICD-10-CM | POA: Diagnosis not present

## 2016-03-31 DIAGNOSIS — S46919S Strain of unspecified muscle, fascia and tendon at shoulder and upper arm level, unspecified arm, sequela: Secondary | ICD-10-CM | POA: Diagnosis not present

## 2016-03-31 DIAGNOSIS — M1990 Unspecified osteoarthritis, unspecified site: Secondary | ICD-10-CM | POA: Diagnosis not present

## 2016-03-31 DIAGNOSIS — M25512 Pain in left shoulder: Secondary | ICD-10-CM | POA: Diagnosis not present

## 2016-04-01 DIAGNOSIS — Z6828 Body mass index (BMI) 28.0-28.9, adult: Secondary | ICD-10-CM | POA: Diagnosis not present

## 2016-04-01 DIAGNOSIS — M25512 Pain in left shoulder: Secondary | ICD-10-CM | POA: Diagnosis not present

## 2016-06-05 DIAGNOSIS — R7309 Other abnormal glucose: Secondary | ICD-10-CM | POA: Diagnosis not present

## 2016-06-05 DIAGNOSIS — E784 Other hyperlipidemia: Secondary | ICD-10-CM | POA: Diagnosis not present

## 2016-06-05 DIAGNOSIS — R8299 Other abnormal findings in urine: Secondary | ICD-10-CM | POA: Diagnosis not present

## 2016-06-05 DIAGNOSIS — Z125 Encounter for screening for malignant neoplasm of prostate: Secondary | ICD-10-CM | POA: Diagnosis not present

## 2016-06-10 DIAGNOSIS — E291 Testicular hypofunction: Secondary | ICD-10-CM | POA: Diagnosis not present

## 2016-06-10 DIAGNOSIS — R7309 Other abnormal glucose: Secondary | ICD-10-CM | POA: Diagnosis not present

## 2016-06-10 DIAGNOSIS — I25118 Atherosclerotic heart disease of native coronary artery with other forms of angina pectoris: Secondary | ICD-10-CM | POA: Diagnosis not present

## 2016-06-10 DIAGNOSIS — M25512 Pain in left shoulder: Secondary | ICD-10-CM | POA: Diagnosis not present

## 2016-06-10 DIAGNOSIS — K219 Gastro-esophageal reflux disease without esophagitis: Secondary | ICD-10-CM | POA: Diagnosis not present

## 2016-06-10 DIAGNOSIS — R109 Unspecified abdominal pain: Secondary | ICD-10-CM | POA: Diagnosis not present

## 2016-06-10 DIAGNOSIS — L821 Other seborrheic keratosis: Secondary | ICD-10-CM | POA: Diagnosis not present

## 2016-06-10 DIAGNOSIS — Z0000 Encounter for general adult medical examination without abnormal findings: Secondary | ICD-10-CM | POA: Diagnosis not present

## 2016-06-10 DIAGNOSIS — R1084 Generalized abdominal pain: Secondary | ICD-10-CM | POA: Diagnosis not present

## 2016-06-10 DIAGNOSIS — R3121 Asymptomatic microscopic hematuria: Secondary | ICD-10-CM | POA: Diagnosis not present

## 2016-06-10 DIAGNOSIS — R3129 Other microscopic hematuria: Secondary | ICD-10-CM | POA: Diagnosis not present

## 2016-06-10 DIAGNOSIS — E784 Other hyperlipidemia: Secondary | ICD-10-CM | POA: Diagnosis not present

## 2016-06-10 DIAGNOSIS — I509 Heart failure, unspecified: Secondary | ICD-10-CM | POA: Diagnosis not present

## 2016-06-12 DIAGNOSIS — Z1212 Encounter for screening for malignant neoplasm of rectum: Secondary | ICD-10-CM | POA: Diagnosis not present

## 2016-07-03 DIAGNOSIS — D2239 Melanocytic nevi of other parts of face: Secondary | ICD-10-CM | POA: Diagnosis not present

## 2016-07-03 DIAGNOSIS — L821 Other seborrheic keratosis: Secondary | ICD-10-CM | POA: Diagnosis not present

## 2016-07-03 DIAGNOSIS — D0462 Carcinoma in situ of skin of left upper limb, including shoulder: Secondary | ICD-10-CM | POA: Diagnosis not present

## 2016-07-03 DIAGNOSIS — L812 Freckles: Secondary | ICD-10-CM | POA: Diagnosis not present

## 2016-07-03 DIAGNOSIS — D1801 Hemangioma of skin and subcutaneous tissue: Secondary | ICD-10-CM | POA: Diagnosis not present

## 2016-07-03 DIAGNOSIS — D3611 Benign neoplasm of peripheral nerves and autonomic nervous system of face, head, and neck: Secondary | ICD-10-CM | POA: Diagnosis not present

## 2016-07-22 DIAGNOSIS — D0462 Carcinoma in situ of skin of left upper limb, including shoulder: Secondary | ICD-10-CM | POA: Diagnosis not present

## 2016-11-12 DIAGNOSIS — Z6830 Body mass index (BMI) 30.0-30.9, adult: Secondary | ICD-10-CM | POA: Diagnosis not present

## 2016-11-12 DIAGNOSIS — R109 Unspecified abdominal pain: Secondary | ICD-10-CM | POA: Diagnosis not present

## 2016-11-12 DIAGNOSIS — H6122 Impacted cerumen, left ear: Secondary | ICD-10-CM | POA: Diagnosis not present

## 2016-11-12 DIAGNOSIS — K589 Irritable bowel syndrome without diarrhea: Secondary | ICD-10-CM | POA: Diagnosis not present

## 2016-11-12 DIAGNOSIS — H9192 Unspecified hearing loss, left ear: Secondary | ICD-10-CM | POA: Diagnosis not present

## 2017-05-12 DIAGNOSIS — K254 Chronic or unspecified gastric ulcer with hemorrhage: Secondary | ICD-10-CM | POA: Diagnosis not present

## 2017-05-12 DIAGNOSIS — R58 Hemorrhage, not elsewhere classified: Secondary | ICD-10-CM | POA: Diagnosis not present

## 2017-05-12 DIAGNOSIS — M542 Cervicalgia: Secondary | ICD-10-CM | POA: Diagnosis not present

## 2017-05-12 DIAGNOSIS — E785 Hyperlipidemia, unspecified: Secondary | ICD-10-CM | POA: Diagnosis not present

## 2017-05-12 DIAGNOSIS — K2970 Gastritis, unspecified, without bleeding: Secondary | ICD-10-CM | POA: Diagnosis not present

## 2017-05-12 DIAGNOSIS — I248 Other forms of acute ischemic heart disease: Secondary | ICD-10-CM | POA: Diagnosis not present

## 2017-05-12 DIAGNOSIS — K219 Gastro-esophageal reflux disease without esophagitis: Secondary | ICD-10-CM | POA: Diagnosis not present

## 2017-05-12 DIAGNOSIS — K264 Chronic or unspecified duodenal ulcer with hemorrhage: Secondary | ICD-10-CM | POA: Diagnosis not present

## 2017-05-12 DIAGNOSIS — R55 Syncope and collapse: Secondary | ICD-10-CM | POA: Diagnosis not present

## 2017-05-12 DIAGNOSIS — K5900 Constipation, unspecified: Secondary | ICD-10-CM | POA: Diagnosis not present

## 2017-05-12 DIAGNOSIS — Z743 Need for continuous supervision: Secondary | ICD-10-CM | POA: Diagnosis not present

## 2017-05-12 DIAGNOSIS — K922 Gastrointestinal hemorrhage, unspecified: Secondary | ICD-10-CM | POA: Diagnosis not present

## 2017-05-12 DIAGNOSIS — K317 Polyp of stomach and duodenum: Secondary | ICD-10-CM | POA: Diagnosis not present

## 2017-05-12 DIAGNOSIS — K760 Fatty (change of) liver, not elsewhere classified: Secondary | ICD-10-CM | POA: Diagnosis not present

## 2017-05-12 DIAGNOSIS — R109 Unspecified abdominal pain: Secondary | ICD-10-CM | POA: Diagnosis not present

## 2017-05-12 DIAGNOSIS — I2510 Atherosclerotic heart disease of native coronary artery without angina pectoris: Secondary | ICD-10-CM | POA: Diagnosis not present

## 2017-05-12 DIAGNOSIS — K5791 Diverticulosis of intestine, part unspecified, without perforation or abscess with bleeding: Secondary | ICD-10-CM | POA: Diagnosis not present

## 2017-05-12 DIAGNOSIS — K269 Duodenal ulcer, unspecified as acute or chronic, without hemorrhage or perforation: Secondary | ICD-10-CM | POA: Diagnosis not present

## 2017-05-12 DIAGNOSIS — I10 Essential (primary) hypertension: Secondary | ICD-10-CM | POA: Diagnosis not present

## 2017-05-12 DIAGNOSIS — K5730 Diverticulosis of large intestine without perforation or abscess without bleeding: Secondary | ICD-10-CM | POA: Diagnosis not present

## 2017-05-12 LAB — PROTIME-INR: INR: 1.3 — AB (ref 0.9–1.1)

## 2017-05-16 DIAGNOSIS — I2510 Atherosclerotic heart disease of native coronary artery without angina pectoris: Secondary | ICD-10-CM | POA: Diagnosis not present

## 2017-05-16 DIAGNOSIS — I10 Essential (primary) hypertension: Secondary | ICD-10-CM | POA: Diagnosis not present

## 2017-05-16 DIAGNOSIS — K922 Gastrointestinal hemorrhage, unspecified: Secondary | ICD-10-CM | POA: Diagnosis not present

## 2017-05-16 DIAGNOSIS — K219 Gastro-esophageal reflux disease without esophagitis: Secondary | ICD-10-CM | POA: Diagnosis not present

## 2017-05-16 LAB — PROTIME-INR: INR: 1.4 — AB (ref 0.9–1.1)

## 2017-05-17 DIAGNOSIS — I2510 Atherosclerotic heart disease of native coronary artery without angina pectoris: Secondary | ICD-10-CM | POA: Diagnosis not present

## 2017-05-17 DIAGNOSIS — K922 Gastrointestinal hemorrhage, unspecified: Secondary | ICD-10-CM | POA: Diagnosis not present

## 2017-05-17 DIAGNOSIS — I10 Essential (primary) hypertension: Secondary | ICD-10-CM | POA: Diagnosis not present

## 2017-05-17 DIAGNOSIS — K219 Gastro-esophageal reflux disease without esophagitis: Secondary | ICD-10-CM | POA: Diagnosis not present

## 2017-06-23 DIAGNOSIS — R7309 Other abnormal glucose: Secondary | ICD-10-CM | POA: Diagnosis not present

## 2017-06-23 DIAGNOSIS — K5909 Other constipation: Secondary | ICD-10-CM | POA: Diagnosis not present

## 2017-06-23 DIAGNOSIS — K5732 Diverticulitis of large intestine without perforation or abscess without bleeding: Secondary | ICD-10-CM | POA: Diagnosis not present

## 2017-06-23 DIAGNOSIS — Z6830 Body mass index (BMI) 30.0-30.9, adult: Secondary | ICD-10-CM | POA: Diagnosis not present

## 2017-06-23 DIAGNOSIS — I25118 Atherosclerotic heart disease of native coronary artery with other forms of angina pectoris: Secondary | ICD-10-CM | POA: Diagnosis not present

## 2017-06-23 DIAGNOSIS — D62 Acute posthemorrhagic anemia: Secondary | ICD-10-CM | POA: Diagnosis not present

## 2017-06-23 DIAGNOSIS — I248 Other forms of acute ischemic heart disease: Secondary | ICD-10-CM | POA: Diagnosis not present

## 2017-06-23 DIAGNOSIS — K5730 Diverticulosis of large intestine without perforation or abscess without bleeding: Secondary | ICD-10-CM | POA: Diagnosis not present

## 2017-06-23 DIAGNOSIS — K2970 Gastritis, unspecified, without bleeding: Secondary | ICD-10-CM | POA: Diagnosis not present

## 2017-06-25 ENCOUNTER — Telehealth: Payer: Self-pay

## 2017-06-25 NOTE — Telephone Encounter (Signed)
Notes sent to scheduling.   

## 2017-07-03 DIAGNOSIS — D2272 Melanocytic nevi of left lower limb, including hip: Secondary | ICD-10-CM | POA: Diagnosis not present

## 2017-07-03 DIAGNOSIS — L820 Inflamed seborrheic keratosis: Secondary | ICD-10-CM | POA: Diagnosis not present

## 2017-07-03 DIAGNOSIS — Z85828 Personal history of other malignant neoplasm of skin: Secondary | ICD-10-CM | POA: Diagnosis not present

## 2017-07-03 DIAGNOSIS — D2262 Melanocytic nevi of left upper limb, including shoulder: Secondary | ICD-10-CM | POA: Diagnosis not present

## 2017-07-03 DIAGNOSIS — D235 Other benign neoplasm of skin of trunk: Secondary | ICD-10-CM | POA: Diagnosis not present

## 2017-07-03 DIAGNOSIS — L821 Other seborrheic keratosis: Secondary | ICD-10-CM | POA: Diagnosis not present

## 2017-07-03 DIAGNOSIS — L570 Actinic keratosis: Secondary | ICD-10-CM | POA: Diagnosis not present

## 2017-08-17 ENCOUNTER — Telehealth: Payer: Self-pay | Admitting: Cardiovascular Disease

## 2017-08-17 DIAGNOSIS — R7309 Other abnormal glucose: Secondary | ICD-10-CM | POA: Diagnosis not present

## 2017-08-17 DIAGNOSIS — E7849 Other hyperlipidemia: Secondary | ICD-10-CM | POA: Diagnosis not present

## 2017-08-17 DIAGNOSIS — E291 Testicular hypofunction: Secondary | ICD-10-CM | POA: Diagnosis not present

## 2017-08-17 DIAGNOSIS — I130 Hypertensive heart and chronic kidney disease with heart failure and stage 1 through stage 4 chronic kidney disease, or unspecified chronic kidney disease: Secondary | ICD-10-CM | POA: Diagnosis not present

## 2017-08-17 DIAGNOSIS — Z125 Encounter for screening for malignant neoplasm of prostate: Secondary | ICD-10-CM | POA: Diagnosis not present

## 2017-08-17 NOTE — Telephone Encounter (Signed)
New message   Patient calling to request order for annual labs be faxed to [phone] [facility]. Patient to have labs done at that office.

## 2017-08-17 NOTE — Telephone Encounter (Signed)
Called patient back. Patient had lab work today with his PCP and wanted our office to know and add anything that needs to be added. [facility], Dr. Sherin Quarry office, they will fax our office lab results which include, CMET, CBC, Lipids, TSH, Hgb A1c, and PSA. Patient will see Truitt Merle NP on 08/25/17. Patient will have his annual with his PCP on 08/24/17 will send message for chart prep to call and get notes for PCP visit.

## 2017-08-21 DIAGNOSIS — Z1212 Encounter for screening for malignant neoplasm of rectum: Secondary | ICD-10-CM | POA: Diagnosis not present

## 2017-08-24 DIAGNOSIS — H9193 Unspecified hearing loss, bilateral: Secondary | ICD-10-CM | POA: Diagnosis not present

## 2017-08-24 DIAGNOSIS — R740 Nonspecific elevation of levels of transaminase and lactic acid dehydrogenase [LDH]: Secondary | ICD-10-CM | POA: Diagnosis not present

## 2017-08-24 DIAGNOSIS — I25118 Atherosclerotic heart disease of native coronary artery with other forms of angina pectoris: Secondary | ICD-10-CM | POA: Diagnosis not present

## 2017-08-24 DIAGNOSIS — E291 Testicular hypofunction: Secondary | ICD-10-CM | POA: Diagnosis not present

## 2017-08-24 DIAGNOSIS — K219 Gastro-esophageal reflux disease without esophagitis: Secondary | ICD-10-CM | POA: Diagnosis not present

## 2017-08-24 DIAGNOSIS — R7309 Other abnormal glucose: Secondary | ICD-10-CM | POA: Diagnosis not present

## 2017-08-24 DIAGNOSIS — E7849 Other hyperlipidemia: Secondary | ICD-10-CM | POA: Diagnosis not present

## 2017-08-24 DIAGNOSIS — D62 Acute posthemorrhagic anemia: Secondary | ICD-10-CM | POA: Diagnosis not present

## 2017-08-24 DIAGNOSIS — Z0000 Encounter for general adult medical examination without abnormal findings: Secondary | ICD-10-CM | POA: Diagnosis not present

## 2017-08-25 ENCOUNTER — Ambulatory Visit (HOSPITAL_COMMUNITY): Payer: Medicare HMO | Attending: Cardiovascular Disease

## 2017-08-25 ENCOUNTER — Encounter

## 2017-08-25 ENCOUNTER — Ambulatory Visit (INDEPENDENT_AMBULATORY_CARE_PROVIDER_SITE_OTHER): Payer: Medicare HMO | Admitting: Nurse Practitioner

## 2017-08-25 ENCOUNTER — Encounter (HOSPITAL_COMMUNITY): Payer: Self-pay | Admitting: *Deleted

## 2017-08-25 ENCOUNTER — Other Ambulatory Visit: Payer: Self-pay

## 2017-08-25 ENCOUNTER — Encounter: Payer: Self-pay | Admitting: Nurse Practitioner

## 2017-08-25 VITALS — BP 150/80 | HR 57 | Ht 67.0 in | Wt 193.0 lb

## 2017-08-25 DIAGNOSIS — R55 Syncope and collapse: Secondary | ICD-10-CM | POA: Diagnosis not present

## 2017-08-25 DIAGNOSIS — E785 Hyperlipidemia, unspecified: Secondary | ICD-10-CM | POA: Diagnosis not present

## 2017-08-25 DIAGNOSIS — I259 Chronic ischemic heart disease, unspecified: Secondary | ICD-10-CM | POA: Diagnosis not present

## 2017-08-25 DIAGNOSIS — I252 Old myocardial infarction: Secondary | ICD-10-CM | POA: Diagnosis not present

## 2017-08-25 DIAGNOSIS — I77810 Thoracic aortic ectasia: Secondary | ICD-10-CM | POA: Insufficient documentation

## 2017-08-25 DIAGNOSIS — I2510 Atherosclerotic heart disease of native coronary artery without angina pectoris: Secondary | ICD-10-CM | POA: Insufficient documentation

## 2017-08-25 DIAGNOSIS — I10 Essential (primary) hypertension: Secondary | ICD-10-CM | POA: Diagnosis not present

## 2017-08-25 DIAGNOSIS — Z87891 Personal history of nicotine dependence: Secondary | ICD-10-CM | POA: Insufficient documentation

## 2017-08-25 DIAGNOSIS — I351 Nonrheumatic aortic (valve) insufficiency: Secondary | ICD-10-CM | POA: Insufficient documentation

## 2017-08-25 DIAGNOSIS — I255 Ischemic cardiomyopathy: Secondary | ICD-10-CM | POA: Insufficient documentation

## 2017-08-25 LAB — ECHOCARDIOGRAM COMPLETE
Height: 67 in
Weight: 3088 oz

## 2017-08-25 NOTE — Progress Notes (Signed)
CARDIOLOGY OFFICE NOTE  Date:  08/25/2017    [name] Date of Birth: Dec 21, 1945 Medical Record #[id number]  PCP:  Velna Hatchet, MD  Cardiologist:  Johnsie Cancel    Chief Complaint  Patient presents with  . Coronary Artery Disease    Follow up visit - seen for Dr. Johnsie Cancel    History of Present Illness: [name] is a 72 y.o. male who presents today for a follow up visit. Seen for Dr. Johnsie Cancel.   He has a history of CAD s/p anterior MI with stents to the LAD and LCx in 08/2010 in [place].  EF was 40% at that time with apparent mural apical thrombus but no coumadin was given.  MI in 08/2010 with stent placed to RCA.  LHC in 4/14 with patent stents.  MRI in 2013 with EF 48% and no  ICD needed.    Admitted in 2016 with chest pain - he was cathed - had patent stents and medical therapy was continued.   Has not been seen here in over 2 years. .   Has been referred back by PCP - had been visiting in [place] back earlier this year- had GI bleed - mildly elevated troponin - felt to be "demand ischemia". Was asked to follow back up here.   Comes in today. Here alone. He brings in a significant number of records for review. He has multiple questions. His GI bleed was back in March - in [place]. He had been vomiting blood. He had ? associated syncope with weakness/near syncope. He had associated diarrhea - had had prior multiple spells of dark brown diarrhea. Plavix held. Noted to have hepatic steatosis. He had a large non bleeding diverticulum noted, a single localized erosion in the duodenal bulb - this was clipped,  as well as multiple gastric polyps noted. Was given the ok to resume Plavix and was to see GI as an outpatient. Was placed on PPI therapy. Does not look like he required transfusion. The only troponin I see was negative. Apparently was to have an echo - this was not done. He has a piece of paper saying he needs to be intense statin or PCSK9 therapy.   He feels like he is doing  ok. Some lower abdominal pain intermittently. He may be seeing Dr. Cristina Gong at some point - little vague on this issue. No real chest pain that is like his prior chest pain syndrome but has had some "tightness" - he feels this may be stress related - occurs with rest - not exertional - worse with "getting aggravated". Tells me repeatedly that it is nothing like his prior chest pain syndrome. No real exercise. No smoking. Just took his medicines about an hour prior to coming. BP was lower yesterday. He is not able to take statin. Felt bad with Red yeast rice as well. No more bleeding. Not too much shortness of breath.   Past Medical History:  Diagnosis Date  . Anxiety   . Arthritis    "wrists" (07/27/2014)  . Coronary artery disease    a.  Pt with 4 stents over the course of 2003-2012;  b.  LHC 6/16:  LAD, RCA, LCx stents patent, OM1 50%, EF 50-55% >> med rx   . Elevated LFTs   . GERD (gastroesophageal reflux disease)   . Hypercholesterolemia   . Hypertension   . Ischemic cardiomyopathy    cMRI 03/2011:  EF 48% with dist ant, septal, apical and inf-apical dyskinesia, no apical clot,  dist ant, apical and septal full thickness scar >> No ICD needed   . Myocardial infarction [facility]) 2003    Past Surgical History:  Procedure Laterality Date  . CARDIAC CATHETERIZATION  06/11/2012   Nonobstructive CAD, patent stents, EF 50%, mild apical dyskinesis  . CARDIAC CATHETERIZATION N/A 07/28/2014   Procedure: Left Heart Cath and Coronary Angiography;  Surgeon: Troy Sine, MD;  Location: [place] [facility];  Service: Cardiovascular;  Laterality: N/A;  . CORONARY ANGIOPLASTY WITH STENT PLACEMENT  2003-2012   total of 4 stents place  . COSMETIC SURGERY Left 1970's   "dog ripped of my ear"  . LEFT HEART CATHETERIZATION WITH CORONARY ANGIOGRAM N/A 06/11/2012   Procedure: LEFT HEART CATHETERIZATION WITH CORONARY ANGIOGRAM;  Surgeon: Peter M Martinique, MD;  Location: [facility] CATH [facility];  Service: Cardiovascular;   Laterality: N/A;     Medications: Current Meds  Medication Sig  . carvedilol (COREG) 3.125 MG tablet Take 1 tablet (3.125 mg total) by mouth 2 (two) times daily with a meal.  . clopidogrel (PLAVIX) 75 MG tablet Take 1 tablet (75 mg total) by mouth daily.  . Cyanocobalamin (VITAMIN B-12) 5000 MCG LOZG Take 5,000 Units by mouth daily.  Marland Kitchen dicyclomine (BENTYL) 20 MG tablet Take 20 mg by mouth every morning.   Marland Kitchen lisinopril (PRINIVIL,ZESTRIL) 20 MG tablet Take 1 tablet (20 mg total) by mouth daily.  Marland Kitchen LORazepam (ATIVAN) 0.5 MG tablet Take 1 tablet (0.5 mg total) by mouth every 8 (eight) hours as needed. For anxiety  . nitroGLYCERIN (NITROSTAT) 0.4 MG SL tablet Place 0.4 mg under the tongue every 5 (five) minutes as needed for chest pain. For a max of 3 doses. If no relief, call 911.  . omega-3 acid ethyl esters (LOVAZA) 1 G capsule Take 1 g by mouth daily.  . pantoprazole (PROTONIX) 40 MG tablet Take 1 tablet (40 mg total) by mouth daily.  . polyethylene glycol (MIRALAX / GLYCOLAX) packet Take 17 g by mouth daily.   . Probiotic Product (ALIGN) 4 MG CAPS Take 4 mg by mouth daily.   . vitamin C (ASCORBIC ACID) 500 MG tablet Take 500 mg by mouth daily.   Marland Kitchen zolpidem (AMBIEN) 10 MG tablet Take 1 tablet (10 mg total) by mouth at bedtime as needed. For sleep  . [DISCONTINUED] spironolactone (ALDACTONE) 25 MG tablet Take 25 mg by mouth daily.      Allergies: Allergies  Allergen Reactions  . Asa [Aspirin]     Abdominal bleeding  . Penicillins Anaphylaxis  . Statins Other (See Comments)    Pt tolerates lovastatin as a home medication without difficulties.    Social History: The patient  reports that he has quit smoking. His smoking use included cigarettes. He has a 15.00 pack-year smoking history. He has never used smokeless tobacco. He reports that he drinks about 1.2 oz of alcohol per week. He reports that he does not use drugs.   Family History: The patient's family history includes Coronary  artery disease in his father and mother.   Review of Systems: Please see the history of present illness.   Otherwise, the review of systems is positive for none.   All other systems are reviewed and negative.   Physical Exam: VS:  BP (!) 150/80 (BP Location: Left Arm, Patient Position: Sitting, Cuff Size: Normal)   Pulse (!) 57   Ht 5\' 7"  (1.702 m)   Wt 193 lb (87.5 kg)   BMI 30.23 kg/m  .  BMI Body  mass index is 30.23 kg/m.  Wt Readings from Last 3 Encounters:  08/25/17 193 lb (87.5 kg)  07/17/15 185 lb (83.9 kg)  09/06/14 189 lb (85.7 kg)    General: Alert. He is in no acute distress.   HEENT: Normal.  Neck: Supple, no JVD, carotid bruits, or masses noted.  Cardiac: Regular rate and rhythm.Heart tones are distant. No edema.  Respiratory:  Lungs are clear to auscultation bilaterally with normal work of breathing.  GI: Soft and nontender.  MS: No deformity or atrophy. Gait and ROM intact.  Skin: Warm and dry. Color is normal.  Neuro:  Strength and sensation are intact and no gross focal deficits noted.  Psych: Alert, appropriate and with normal affect.   LABORATORY DATA:  EKG:  EKG is ordered today. This demonstrates sinus brady, septal MI with lateral Q's. Unchanged.   Lab Results  Component Value Date   WBC 6.7 11/09/2015   HGB 12.7 (L) 11/09/2015   HCT 38.6 (L) 11/09/2015   PLT 209 11/09/2015   GLUCOSE 115 (H) 11/09/2015   CHOL 180 06/11/2012   TRIG 367 (H) 06/11/2012   HDL 34 (L) 06/11/2012   LDLCALC 73 06/11/2012   ALT 53 10/22/2013   AST 33 10/22/2013   NA 136 11/09/2015   K 4.3 11/09/2015   CL 104 11/09/2015   CREATININE 1.28 (H) 11/09/2015   BUN 22 (H) 11/09/2015   CO2 24 11/09/2015   TSH 1.661 01/08/2011   INR 1.27 07/28/2014   HGBA1C 6.0 (H) 01/08/2011       BNP (last 3 results) No results for input(s): BNP in the last 8760 hours.  ProBNP (last 3 results) No results for input(s): PROBNP in the last 8760 hours.   Other Studies Reviewed  Today:  LHC 07/28/14 LAD:  Stent patent, 20% ISR LCx:  Stent patent OM1:  50% RCA:  Stent patent Low normal LV function with an ejection fraction of 50-55% with evidence for an akinetic/dyskinetic aneurysmal apex. Coronary artery disease with patent tandem proximal to mid LAD stents with smooth 20% in-stent narrowing in the distal aspect of the stent; patent left circumflex stent with evidence for 50-60% proximal narrowing in a small marginal branch which arises with aupper takeoff from this stented segment; and widely patent RCA stent in a dominant RCA system. RECOMMENDATION:  Medical therapy.  Cardiac MRI 03/2011 Impression: 1) Moderate LVE EF 48% with distal anterior, septal, apical and inferoapical dyskinesia 2) No mural apical thrombus 3) Full thickness scar involving the distal anterior wall, apex and septum 4) MR should be looked at further with echo 5) Mild LAE   Assessment/Plan:  1. Recent episode of syncope/presyncope in the setting of GI bleeding with elevated troponin/demand ischemia - he has not had recurrence. Multiple polyps noted - may need to get back to GI for long term disposition.   2. Known CAD with prior PCI's - last cath in 2016 - his stents were patent - he has been on DAPT - now just on Plavix. He has some chest tightness - does not seem like his prior chest pain syndrome. I have recommended Myoview - he refuses stress testing. EKG chronically abnormal.   3. Borderline DM   4. HLD - not at goal - apparently has refused "shots" in the past. He is intolerant to statins. He is only on fish oil. I have offered him to see the lipid clinic - he has declined.   5. Ischemic CM - EF by MRI in  2013 48% and 50-55% by recent LHC.  He was apparently to have an echo with this recent event - this was not done - he is agreeable to having this study.   6. HTN - just took his medicines - he will monitor. No changes made at this time.    Current medicines are  reviewed with the patient today.  The patient does not have concerns regarding medicines other than what has been noted above.  The following changes have been made:  See above.  Labs/ tests ordered today include:    Orders Placed This Encounter  Procedures  . EKG 12-Lead  . ECHOCARDIOGRAM COMPLETE     Disposition:   Tenuous situation overall. He does not wish to have stress testing. He does not wish to take other lipid lowering agents at this time. FU with Dr. Johnsie Cancel in 3 months.   Patient is agreeable to this plan and will call if any problems develop in the interim.   SignedTruitt Merle, NP  08/25/2017 2:11 PM  [facility] [address] [place], [place]  [address] Phone: [phone] Fax: [phone]

## 2017-08-25 NOTE — Patient Instructions (Addendum)
We will be checking the following labs today - NONE   Medication Instructions:    Continue with your current medicines.     Testing/Procedures To Be Arranged:  Echocardiogram  Let us know if you would like to proceed with the nuclear stress testing  Follow-Up:   See Dr. Johnsie Cancel in about 3 months    Other Special Instructions:   N/A    If you need a refill on your cardiac medications before your next appointment, please call your pharmacy.   Call the [place] office at [phone] if you have any questions, problems or concerns.

## 2017-08-25 NOTE — Progress Notes (Unsigned)
Mr. [name] declines use of Definity Image Enhancing agent today (despite being told he would most likely be asked to return for a limited exam to use it.)  I explained that I could not visualize the damaged portion of his heart as well as I would like, and that the physician would want to see this area better.  He understands, and replies that he has been through a lot in the hospital recently, has had a bad experience with contrast being injected improperly, and would prefer to wait for his physician to order it specifically.  He is agreeable to return if this is necessary.  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  Please advise.   Deliah Boston, RDCS

## 2017-08-26 ENCOUNTER — Other Ambulatory Visit: Payer: Self-pay | Admitting: *Deleted

## 2017-08-26 DIAGNOSIS — I509 Heart failure, unspecified: Secondary | ICD-10-CM

## 2017-09-01 ENCOUNTER — Ambulatory Visit (HOSPITAL_COMMUNITY): Payer: Medicare HMO | Attending: Cardiovascular Disease

## 2017-09-01 DIAGNOSIS — I2510 Atherosclerotic heart disease of native coronary artery without angina pectoris: Secondary | ICD-10-CM | POA: Insufficient documentation

## 2017-09-01 DIAGNOSIS — I110 Hypertensive heart disease with heart failure: Secondary | ICD-10-CM | POA: Diagnosis not present

## 2017-09-01 DIAGNOSIS — I255 Ischemic cardiomyopathy: Secondary | ICD-10-CM | POA: Diagnosis not present

## 2017-09-01 DIAGNOSIS — Z8249 Family history of ischemic heart disease and other diseases of the circulatory system: Secondary | ICD-10-CM | POA: Insufficient documentation

## 2017-09-01 DIAGNOSIS — Z87891 Personal history of nicotine dependence: Secondary | ICD-10-CM | POA: Insufficient documentation

## 2017-09-01 DIAGNOSIS — I509 Heart failure, unspecified: Secondary | ICD-10-CM

## 2017-09-01 DIAGNOSIS — E785 Hyperlipidemia, unspecified: Secondary | ICD-10-CM | POA: Insufficient documentation

## 2017-09-01 MED ORDER — PERFLUTREN LIPID MICROSPHERE
1.0000 mL | INTRAVENOUS | Status: AC | PRN
  Administered 2017-09-01: 2 mL via INTRAVENOUS

## 2017-09-02 ENCOUNTER — Encounter: Payer: Self-pay | Admitting: Physician Assistant

## 2017-09-07 ENCOUNTER — Other Ambulatory Visit: Payer: Self-pay | Admitting: *Deleted

## 2017-09-07 DIAGNOSIS — I509 Heart failure, unspecified: Secondary | ICD-10-CM

## 2017-09-07 MED ORDER — SACUBITRIL-VALSARTAN 24-26 MG PO TABS: 1.0000 | ORAL_TABLET | Freq: Two times a day (BID) | ORAL | 2 refills | Status: DC

## 2017-09-08 ENCOUNTER — Telehealth: Payer: Self-pay

## 2017-09-08 ENCOUNTER — Other Ambulatory Visit: Payer: Self-pay | Admitting: *Deleted

## 2017-09-08 DIAGNOSIS — I5021 Acute systolic (congestive) heart failure: Secondary | ICD-10-CM

## 2017-09-08 NOTE — Telephone Encounter (Signed)
I have done an [place] PA through covermymeds. [id number]

## 2017-09-09 NOTE — Telephone Encounter (Signed)
**Note De-Identified T[place]i Towe Obfuscation** Letter received Cleora Karnik fax from [facility] stating that they have approved the pts [place] PA. Approval good until 09/09/2019.

## 2017-09-11 ENCOUNTER — Encounter: Payer: Self-pay | Admitting: Cardiovascular Disease

## 2017-09-16 ENCOUNTER — Telehealth: Payer: Self-pay | Admitting: Cardiovascular Disease

## 2017-09-16 ENCOUNTER — Other Ambulatory Visit: Payer: Self-pay | Admitting: *Deleted

## 2017-09-16 ENCOUNTER — Telehealth: Payer: Self-pay | Admitting: *Deleted

## 2017-09-16 DIAGNOSIS — I5023 Acute on chronic systolic (congestive) heart failure: Secondary | ICD-10-CM

## 2017-09-16 NOTE — Telephone Encounter (Signed)
Good morning,  This is changed.  Thanks so much.  Have a great day.  Andee Poles

## 2017-09-16 NOTE — Telephone Encounter (Signed)
New message  Pt states that he had a Hemostatic Clip on 05/13/2017 w/ Dr. Harrell Lark on 05/13/2017 phone # [phone]. Pt wants to know if this will interfere with MR Cardiac. Pls contact pt.

## 2017-09-16 NOTE — Telephone Encounter (Signed)
-----   Message from [name] sent at 09/16/2017  9:21 AM EDT ----- Regarding: order   Good Morning Erick Oxendine  The MRI that this patient is scheduled for needs to be with and without contrast.  Thanks [facility]

## 2017-09-16 NOTE — Telephone Encounter (Signed)
Please call back pt and have Dr. Johnsie Cancel review with upcoming appt.

## 2017-09-16 NOTE — Telephone Encounter (Signed)
Called patient Hemostatic clip was due to GI Bleed. Patient had done in [facility]. Will forward to Dr. Johnsie Cancel for advisement.

## 2017-09-17 NOTE — Telephone Encounter (Signed)
Patient is going to bring in paper work of his GI procedure so we can make copies and put in his chart. Patient will also need to sign a release form in case we need records. Patient stated he would bring in next week.

## 2017-09-17 NOTE — Telephone Encounter (Signed)
Should not have MRI for 6 months He should have gotten a card regarding the clips Need to know what kind

## 2017-09-24 ENCOUNTER — Encounter: Payer: Self-pay | Admitting: Pharmacist

## 2017-09-24 ENCOUNTER — Telehealth: Payer: Self-pay | Admitting: Cardiovascular Disease

## 2017-09-24 ENCOUNTER — Ambulatory Visit (INDEPENDENT_AMBULATORY_CARE_PROVIDER_SITE_OTHER): Payer: Medicare HMO | Admitting: Pharmacist

## 2017-09-24 ENCOUNTER — Other Ambulatory Visit: Payer: Medicare HMO | Admitting: *Deleted

## 2017-09-24 VITALS — BP 144/72 | HR 57

## 2017-09-24 DIAGNOSIS — I10 Essential (primary) hypertension: Secondary | ICD-10-CM | POA: Diagnosis not present

## 2017-09-24 DIAGNOSIS — I509 Heart failure, unspecified: Secondary | ICD-10-CM | POA: Diagnosis not present

## 2017-09-24 LAB — BASIC METABOLIC PANEL
BUN/Creatinine Ratio: 15 (ref 10–24)
BUN: 17 mg/dL (ref 8–27)
CO2: 25 mmol/L (ref 20–29)
Calcium: 9.6 mg/dL (ref 8.6–10.2)
Chloride: 99 mmol/L (ref 96–106)
Creatinine, Ser: 1.16 mg/dL (ref 0.76–1.27)
GFR calc Af Amer: 72 mL/min/{1.73_m2} (ref >59)
GFR calc non Af Amer: 63 mL/min/{1.73_m2} (ref >59)
Glucose: 115 mg/dL — ABNORMAL HIGH (ref 65–99)
Potassium: 4.8 mmol/L (ref 3.5–5.2)
Sodium: 138 mmol/L (ref 134–144)

## 2017-09-24 NOTE — Telephone Encounter (Signed)
Kim in Medical Records has made copy of records, so patient can have his originals back. Records are in Dr. Kyla Balzarine box ready for review.

## 2017-09-24 NOTE — Progress Notes (Addendum)
Patient ID: [name]                 DOB: 10-15-1945                      MRN: [id number]     HPI: [name] is a 72 y.o. male patient of Dr. Johnsie Cancel who presents today for hypertension evaluation. PMH significant for CAD s/p anterior MI with stents to the LAD and LCx in 08/2010 in [place]. EF was 40% at that time with apparent mural apical thrombus but no coumadin was given. MI in 08/2010 with stent placed to RCA. LHC in 4/14 with patent stents. He was recently started on Entresto after Echo revealed low EF of 35%.   He used a NTG about a week ago. NTG relieved pain, not pain since then. He has had some issues with increased constipation since starting Entresto. He reports that his blood pressure has been good. He also states that he has had some issues with ED, but this is not new for him. He reports that his has been taking his carvedilol only once daily.   Current HTN meds:  Carvedilol 3.125mg  BID - taking only once daily Entresto 24/26mg  BID  Previously tried: spironolactone  BP goal: <130/80  Family History: Coronary artery disease in his father and mother.  Social History: The patient  reports that he has quit smoking. His smoking use included cigarettes. He has a 15.00 pack-year smoking history. He has never used smokeless tobacco. He reports that he drinks about 1.2 oz of alcohol per week. He reports that he does not use drugs.  Diet: Most meals from out. He eats mostly at [employer]. He drinks mostly water he mixes energy booster with water. He drinks coffee one cup occasionally. He rarely adds salt.   Exercise: He does weights and curls most days.   Home BP readings: 120s/70s - occasional 130/80s   Wt Readings from Last 3 Encounters:  08/25/17 193 lb (87.5 kg)  07/17/15 185 lb (83.9 kg)  09/06/14 189 lb (85.7 kg)   BP Readings from Last 3 Encounters:  09/24/17 (!) 144/72  08/25/17 (!) 150/80  11/10/15 122/70   Pulse Readings from Last 3 Encounters:    09/24/17 (!) 57  08/25/17 (!) 57  11/10/15 62    Renal function: CrCl cannot be calculated (Patient's most recent lab result is older than the maximum 21 days allowed.).  Past Medical History:  Diagnosis Date  . Anxiety   . Arthritis    "wrists" (07/27/2014)  . Coronary artery disease    a.  Pt with 4 stents over the course of 2003-2012;  b.  LHC 6/16:  LAD, RCA, LCx stents patent, OM1 50%, EF 50-55% >> med rx   . Elevated LFTs   . GERD (gastroesophageal reflux disease)   . Hypercholesterolemia   . Hypertension   . Ischemic cardiomyopathy    cMRI 03/2011:  EF 48% with dist ant, septal, apical and inf-apical dyskinesia, no apical clot, dist ant, apical and septal full thickness scar >> No ICD needed  //  Limited echo 7/19: Large apical septal aneurysm, severe hypokinesis of the entire apex and mid to apical segments of the anterior wall, anterolateral wall and anterior septum-consistent with infarct and most of the LAD territory; EF 35   . Left ventricular aneurysm    Limited echo 7/19: Large apical septal aneurysm, severe hypokinesis of the entire apex and mid to apical segments of  the anterior wall, anterolateral wall and anterior septum-consistent with infarct and most of the LAD territory; EF 35  . Myocardial infarction [facility]) 2003    Current Outpatient Medications on File Prior to Visit  Medication Sig Dispense Refill  . carvedilol (COREG) 3.125 MG tablet Take 1 tablet (3.125 mg total) by mouth 2 (two) times daily with a meal. 60 tablet 3  . clopidogrel (PLAVIX) 75 MG tablet Take 1 tablet (75 mg total) by mouth daily. 30 tablet 3  . Cyanocobalamin (VITAMIN B-12) 5000 MCG LOZG Take 5,000 Units by mouth daily.    Marland Kitchen dicyclomine (BENTYL) 20 MG tablet Take 20 mg by mouth every morning.     Marland Kitchen LORazepam (ATIVAN) 0.5 MG tablet Take 1 tablet (0.5 mg total) by mouth every 8 (eight) hours as needed. For anxiety 60 tablet 5  . nitroGLYCERIN (NITROSTAT) 0.4 MG SL tablet Place 0.4 mg under the  tongue every 5 (five) minutes as needed for chest pain. For a max of 3 doses. If no relief, call 911.    . omega-3 acid ethyl esters (LOVAZA) 1 G capsule Take 1 g by mouth daily.    . pantoprazole (PROTONIX) 40 MG tablet Take 1 tablet (40 mg total) by mouth daily. 30 tablet 11  . polyethylene glycol (MIRALAX / GLYCOLAX) packet Take 17 g by mouth daily.     . Probiotic Product (ALIGN) 4 MG CAPS Take 4 mg by mouth daily.     . vitamin C (ASCORBIC ACID) 500 MG tablet Take 500 mg by mouth daily.     Marland Kitchen zolpidem (AMBIEN) 10 MG tablet Take 1 tablet (10 mg total) by mouth at bedtime as needed. For sleep 30 tablet 5  . sacubitril-valsartan (ENTRESTO) 49-51 MG Take 1 tablet by mouth 2 (two) times daily. 60 tablet   . [DISCONTINUED] spironolactone (ALDACTONE) 25 MG tablet Take 25 mg by mouth daily.      No current facility-administered medications on file prior to visit.     Allergies  Allergen Reactions  . Asa [Aspirin]     Abdominal bleeding  . Penicillins Anaphylaxis  . Statins Other (See Comments)    Pt tolerates lovastatin as a home medication without difficulties.    Blood pressure (!) 144/72, pulse (!) 57.   Assessment/Plan: Hypertension: BMET today. BP today is slightly above goal range. Will increase Entresto to mid dose. Also advised to take carvedilol as prescribed twice daily. May need to consider change to once daily option if not adherent to twice daily dosing. Follow up in HTN clinic in 3-4 weeks.     Thank you, Lelan Pons. Patterson Hammersmith, [facility]  09/24/2017 2:37 PM  ADDENDUM: BMET returned WNL. Continue as above.

## 2017-09-24 NOTE — Telephone Encounter (Signed)
New message    Patient dropped off paperwork from hospital , to medical records, he would like to get those back when he comes in for his appt on 09/30/17

## 2017-09-24 NOTE — Telephone Encounter (Signed)
He just brought it in this am.

## 2017-09-24 NOTE — Patient Instructions (Addendum)
Return for a follow up appointment in 3-4 weeks   Check your blood pressure at home daily (if able) and keep record of the readings.  Take your BP meds as follows: INCREASE Entresto to 49/51mg  TWICE daily  TAKE carvedilol 3.125mg  TWICE daily   Bring all of your meds, your BP cuff and your record of home blood pressures to your next appointment.  Exercise as you're able, try to walk approximately 30 minutes per day.  Keep salt intake to a minimum, especially watch canned and prepared boxed foods.  Eat more fresh fruits and vegetables and fewer canned items.  Avoid eating in fast food restaurants.    HOW TO TAKE YOUR BLOOD PRESSURE: . Rest 5 minutes before taking your blood pressure. .  Don't smoke or drink caffeinated beverages for at least 30 minutes before. . Take your blood pressure before (not after) you eat. . Sit comfortably with your back supported and both feet on the floor (don't cross your legs). . Elevate your arm to heart level on a table or a desk. . Use the proper sized cuff. It should fit smoothly and snugly around your bare upper arm. There should be enough room to slip a fingertip under the cuff. The bottom edge of the cuff should be 1 inch above the crease of the elbow. . Ideally, take 3 measurements at one sitting and record the average.

## 2017-09-24 NOTE — Telephone Encounter (Signed)
Pt dropped off records from [facility] in [place] doc box for review.

## 2017-09-29 NOTE — Progress Notes (Signed)
CARDIOLOGY OFFICE NOTE  Date:  09/30/2017    [name] Date of Birth: [birth year] Medical Record #[id number]  PCP:  Velna Hatchet, MD  Cardiologist:  Johnsie Cancel    No chief complaint on file.   History of Present Illness:  72 y.o. f/u CAD. Previous CAD with anterior MI and stents to LAD and Circumflex done in [place]. Had repeat cath in 2016 with patent stents medical Rx recommended EF has been in the 48% range not needing AICD.  Had GI bleed in [place] Reviewed over 50 pages of records from [facility]. Had a large erosion in the duodenal bulb that was clipped. Plavix was resumed and placed on PPI Rx No transfusion was needed there was a question of mildly elevated troponin but labs reviewed did not show this. Intolerant to red yeast and statins  Spoke with our GI doctors and these GI clips are MRI safe The actual note indicates that it is (MR conditional) which would indicate [employer] as safe so long as spatial Research officer, political party is less than 1600 gauss/cm   July had echo EF 35% ACE stopped and was to start entresto with plans for f/u MRI October to see if he should be considered for AICD  He has some exertional dyspnea but thinks he is improved Sister Died in [place] this week from COPD. Discussed increasing Entresto to max dose and MRI in October although he does not think he would go for an AICD  Past Medical History:  Diagnosis Date  . Anxiety   . Arthritis    "wrists" (07/27/2014)  . Coronary artery disease    a.  Pt with 4 stents over the course of 2003-2012;  b.  LHC 6/16:  LAD, RCA, LCx stents patent, OM1 50%, EF 50-55% >> med rx   . Elevated LFTs   . GERD (gastroesophageal reflux disease)   . Hypercholesterolemia   . Hypertension   . Ischemic cardiomyopathy    cMRI 03/2011:  EF 48% with dist ant, septal, apical and inf-apical dyskinesia, no apical clot, dist ant, apical and septal full thickness scar >> No ICD needed  //  Limited echo 7/19:  Large apical septal aneurysm, severe hypokinesis of the entire apex and mid to apical segments of the anterior wall, anterolateral wall and anterior septum-consistent with infarct and most of the LAD territory; EF 35   . Left ventricular aneurysm    Limited echo 7/19: Large apical septal aneurysm, severe hypokinesis of the entire apex and mid to apical segments of the anterior wall, anterolateral wall and anterior septum-consistent with infarct and most of the LAD territory; EF 35  . Myocardial infarction [facility]) 2003    Past Surgical History:  Procedure Laterality Date  . CARDIAC CATHETERIZATION  06/11/2012   Nonobstructive CAD, patent stents, EF 50%, mild apical dyskinesis  . CARDIAC CATHETERIZATION N/A 07/28/2014   Procedure: Left Heart Cath and Coronary Angiography;  Surgeon: Troy Sine, MD;  Location: [place] [facility];  Service: Cardiovascular;  Laterality: N/A;  . CORONARY ANGIOPLASTY WITH STENT PLACEMENT  2003-2012   total of 4 stents place  . COSMETIC SURGERY Left 1970's   "dog ripped of my ear"  . LEFT HEART CATHETERIZATION WITH CORONARY ANGIOGRAM N/A 06/11/2012   Procedure: LEFT HEART CATHETERIZATION WITH CORONARY ANGIOGRAM;  Surgeon: Gabrial Domine M Martinique, MD;  Location: [facility] CATH LAB;  Service: Cardiovascular;  Laterality: N/A;     Medications: Current Meds  Medication Sig  .  carvedilol (COREG) 3.125 MG tablet Take 1 tablet (3.125 mg total) by mouth 2 (two) times daily with a meal.  . clopidogrel (PLAVIX) 75 MG tablet Take 1 tablet (75 mg total) by mouth daily.  . Cyanocobalamin (VITAMIN B-12) 5000 MCG LOZG Take 5,000 Units by mouth daily.  Marland Kitchen dicyclomine (BENTYL) 20 MG tablet Take 20 mg by mouth every morning.   Marland Kitchen LORazepam (ATIVAN) 0.5 MG tablet Take 1 tablet (0.5 mg total) by mouth every 8 (eight) hours as needed. For anxiety  . nitroGLYCERIN (NITROSTAT) 0.4 MG SL tablet Place 0.4 mg under the tongue every 5 (five) minutes as needed for chest pain. For a max of 3 doses. If no  relief, call 911.  . omega-3 acid ethyl esters (LOVAZA) 1 G capsule Take 1 g by mouth daily.  . pantoprazole (PROTONIX) 40 MG tablet Take 1 tablet (40 mg total) by mouth daily.  . polyethylene glycol (MIRALAX / GLYCOLAX) packet Take 17 g by mouth daily.   . Probiotic Product (ALIGN) 4 MG CAPS Take 4 mg by mouth daily.   . vitamin C (ASCORBIC ACID) 500 MG tablet Take 500 mg by mouth daily.   Marland Kitchen zolpidem (AMBIEN) 10 MG tablet Take 1 tablet (10 mg total) by mouth at bedtime as needed. For sleep  . [DISCONTINUED] carvedilol (COREG) 3.125 MG tablet Take 1 tablet (3.125 mg total) by mouth 2 (two) times daily with a meal.  . [DISCONTINUED] sacubitril-valsartan (ENTRESTO) 49-51 MG Take 1 tablet by mouth 2 (two) times daily.     Allergies: Allergies  Allergen Reactions  . Asa [Aspirin]     Abdominal bleeding  . Penicillins Anaphylaxis  . Statins Other (See Comments)    Pt tolerates lovastatin as a home medication without difficulties.    Social History: The patient  reports that he has quit smoking. His smoking use included cigarettes. He has a 15.00 pack-year smoking history. He has never used smokeless tobacco. He reports that he drinks about 1.2 oz of alcohol per week. He reports that he does not use drugs.   Family History: The patient's family history includes Coronary artery disease in his father and mother.   Review of Systems: Please see the history of present illness.   Otherwise, the review of systems is positive for none.   All other systems are reviewed and negative.   Physical Exam: VS:  BP 132/72   Pulse 78   Ht 5\' 7"  (1.702 m)   Wt 188 lb 12 oz (85.6 kg)   SpO2 97%   BMI 29.56 kg/m  .  BMI Body mass index is 29.56 kg/m.  Wt Readings from Last 3 Encounters:  09/30/17 188 lb 12 oz (85.6 kg)  08/25/17 193 lb (87.5 kg)  07/17/15 185 lb (83.9 kg)    General: Alert. He is in no acute distress.   HEENT: Normal.  Neck: Supple, no JVD, carotid bruits, or masses noted.    Cardiac: Regular rate and rhythm.Heart tones are distant. No edema.  Respiratory:  Lungs are clear to auscultation bilaterally with normal work of breathing.  GI: Soft and nontender.  MS: No deformity or atrophy. Gait and ROM intact.  Skin: Warm and dry. Color is normal.  Neuro:  Strength and sensation are intact and no gross focal deficits noted.  Psych: Alert, appropriate and with normal affect.   LABORATORY DATA:  EKG:  EKG is ordered today. This demonstrates sinus brady, septal MI with lateral Q's. Unchanged.   Lab Results  Component Value Date   WBC 6.7 11/09/2015   HGB 12.7 (L) 11/09/2015   HCT 38.6 (L) 11/09/2015   PLT 209 11/09/2015   GLUCOSE 115 (H) 09/24/2017   CHOL 180 06/11/2012   TRIG 367 (H) 06/11/2012   HDL 34 (L) 06/11/2012   LDLCALC 73 06/11/2012   ALT 53 10/22/2013   AST 33 10/22/2013   NA 138 09/24/2017   K 4.8 09/24/2017   CL 99 09/24/2017   CREATININE 1.16 09/24/2017   BUN 17 09/24/2017   CO2 25 09/24/2017   TSH 1.661 01/08/2011   INR 1.27 07/28/2014   HGBA1C 6.0 (H) 01/08/2011       BNP (last 3 results) No results for input(s): BNP in the last 8760 hours.  ProBNP (last 3 results) No results for input(s): PROBNP in the last 8760 hours.   Other Studies Reviewed Today:  LHC 07/28/14 LAD:  Stent patent, 20% ISR LCx:  Stent patent OM1:  50% RCA:  Stent patent Low normal LV function with an ejection fraction of 50-55% with evidence for an akinetic/dyskinetic aneurysmal apex. Coronary artery disease with patent tandem proximal to mid LAD stents with smooth 20% in-stent narrowing in the distal aspect of the stent; patent left circumflex stent with evidence for 50-60% proximal narrowing in a small marginal branch which arises with aupper takeoff from this stented segment; and widely patent RCA stent in a dominant RCA system. RECOMMENDATION:  Medical therapy.  Cardiac MRI 03/2011 Impression: 1) Moderate LVE EF 48% with distal anterior,  septal, apical and inferoapical dyskinesia 2) No mural apical thrombus 3) Full thickness scar involving the distal anterior wall, apex and septum 4) MR should be looked at further with echo 5) Mild LAE   Assessment/Plan:  1. GI bleed:  Multiple abnormalities on EGD but had MRI compatible clip to duodenal erosion end of March /fu Buccini continue proton pump inhibitor   2. Known CAD with prior PCI's - last cath in 2016 - his stents were patent - he has been on DAPT - now just on Plavix. Refused f/u myovue with PA July 2019  Continue medical Rx  3. Borderline DM Discussed low carb diet.  Target hemoglobin A1c is 6.5 or less.  Continue current medications.   4. HLD - not at goal - apparently has refused "shots" in the past. He is intolerant to statins. He is only on fish oil.  Declined lipid clinic f/u   5. Ischemic CM - EF 35% TTE done . 09/01/17  Optimizing meds increased dose of entresto Some issues with compliance of bid coreg Plan is to f/u with MRI in October to see if AICD  Needs to be entertained  Home BP readings 149-702 mmHg systolic so will try higher dose with repeat BMET /BNP in 3 weeks  6. HTN - Well controlled.  Continue current medications and low sodium Dash type diet.   Spent over 30 minutes alone discussing GI clip with our GI doctors and reviewing records from is hospital stay in [place] including the actual operative note    Jenkins Rouge

## 2017-09-30 ENCOUNTER — Ambulatory Visit (INDEPENDENT_AMBULATORY_CARE_PROVIDER_SITE_OTHER): Payer: Medicare HMO | Admitting: Cardiovascular Disease

## 2017-09-30 ENCOUNTER — Encounter: Payer: Self-pay | Admitting: Cardiovascular Disease

## 2017-09-30 VITALS — BP 132/72 | HR 78 | Ht 67.0 in | Wt 188.8 lb

## 2017-09-30 DIAGNOSIS — I5023 Acute on chronic systolic (congestive) heart failure: Secondary | ICD-10-CM | POA: Diagnosis not present

## 2017-09-30 DIAGNOSIS — E785 Hyperlipidemia, unspecified: Secondary | ICD-10-CM | POA: Diagnosis not present

## 2017-09-30 DIAGNOSIS — I2510 Atherosclerotic heart disease of native coronary artery without angina pectoris: Secondary | ICD-10-CM

## 2017-09-30 DIAGNOSIS — I10 Essential (primary) hypertension: Secondary | ICD-10-CM

## 2017-09-30 LAB — BASIC METABOLIC PANEL
BUN / CREAT RATIO: 11 (ref 10–24)
BUN: 13 mg/dL (ref 8–27)
CALCIUM: 9.8 mg/dL (ref 8.6–10.2)
CHLORIDE: 100 mmol/L (ref 96–106)
CO2: 22 mmol/L (ref 20–29)
Creatinine, Ser: 1.17 mg/dL (ref 0.76–1.27)
GFR calc non Af Amer: 62 mL/min/{1.73_m2} (ref >59)
GFR, EST AFRICAN AMERICAN: 72 mL/min/{1.73_m2} (ref >59)
Glucose: 106 mg/dL — ABNORMAL HIGH (ref 65–99)
POTASSIUM: 4.6 mmol/L (ref 3.5–5.2)
SODIUM: 138 mmol/L (ref 134–144)

## 2017-09-30 MED ORDER — CARVEDILOL 3.125 MG PO TABS: 3.1250 mg | ORAL_TABLET | Freq: Two times a day (BID) | ORAL | 3 refills | Status: DC

## 2017-09-30 MED ORDER — SACUBITRIL-VALSARTAN 97-103 MG PO TABS: 1.0000 | ORAL_TABLET | Freq: Two times a day (BID) | ORAL | 11 refills | Status: DC

## 2017-09-30 NOTE — Patient Instructions (Addendum)
Medication Instructions:  Your physician has recommended you make the following change in your medication:  1-Increase Entresto 97/103 by mouth twice daily.  Labwork: Your physician recommends that you have lab work today BMET  Your physician recommends that you return for lab work in: 3 weeks for BMET  Testing/Procedures: NONE  Follow-Up: Your physician recommends that you schedule a follow-up appointment in: 3 weeks with pharmacy for Entresto changes.  Your physician wants you to follow-up in: October after MRI with Dr. Johnsie Cancel.    If you need a refill on your cardiac medications before your next appointment, please call your pharmacy.

## 2017-10-02 ENCOUNTER — Telehealth: Payer: Self-pay

## 2017-10-02 ENCOUNTER — Other Ambulatory Visit: Payer: Self-pay

## 2017-10-02 DIAGNOSIS — I255 Ischemic cardiomyopathy: Secondary | ICD-10-CM

## 2017-10-02 NOTE — Telephone Encounter (Signed)
**Note De-Identified Antonetta Clanton Obfuscation** We received a fax from [facility] stating that either the pts Entresto RX was missing/illegible or needs a PA.  I called [facility] and was advised that a PA is not required as it went through without an issues for $8.50 and the RX had clear instructions. I was advised to discard the fax.

## 2017-10-05 ENCOUNTER — Other Ambulatory Visit: Payer: Self-pay

## 2017-10-05 MED ORDER — SACUBITRIL-VALSARTAN 97-103 MG PO TABS: 1.0000 | ORAL_TABLET | Freq: Two times a day (BID) | ORAL | 11 refills | Status: DC

## 2017-10-16 DIAGNOSIS — K5732 Diverticulitis of large intestine without perforation or abscess without bleeding: Secondary | ICD-10-CM | POA: Diagnosis not present

## 2017-10-16 DIAGNOSIS — Z6830 Body mass index (BMI) 30.0-30.9, adult: Secondary | ICD-10-CM | POA: Diagnosis not present

## 2017-10-16 DIAGNOSIS — R350 Frequency of micturition: Secondary | ICD-10-CM | POA: Diagnosis not present

## 2017-10-19 ENCOUNTER — Emergency Department (HOSPITAL_COMMUNITY): Payer: Medicare HMO

## 2017-10-19 ENCOUNTER — Inpatient Hospital Stay (HOSPITAL_COMMUNITY)
Admission: EM | Admit: 2017-10-19 | Discharge: 2017-10-21 | DRG: 247 | Disposition: A | Discharge status: Home / Self Care | Payer: Medicare HMO | Attending: Internal Medicine | Admitting: Internal Medicine

## 2017-10-19 ENCOUNTER — Encounter (HOSPITAL_COMMUNITY): Payer: Self-pay

## 2017-10-19 DIAGNOSIS — I25119 Atherosclerotic heart disease of native coronary artery with unspecified angina pectoris: Secondary | ICD-10-CM | POA: Diagnosis present

## 2017-10-19 DIAGNOSIS — I2510 Atherosclerotic heart disease of native coronary artery without angina pectoris: Secondary | ICD-10-CM

## 2017-10-19 DIAGNOSIS — I252 Old myocardial infarction: Secondary | ICD-10-CM

## 2017-10-19 DIAGNOSIS — Z886 Allergy status to analgesic agent status: Secondary | ICD-10-CM

## 2017-10-19 DIAGNOSIS — E7800 Pure hypercholesterolemia, unspecified: Secondary | ICD-10-CM | POA: Diagnosis present

## 2017-10-19 DIAGNOSIS — N183 Chronic kidney disease, stage 3 unspecified: Secondary | ICD-10-CM | POA: Diagnosis present

## 2017-10-19 DIAGNOSIS — E785 Hyperlipidemia, unspecified: Secondary | ICD-10-CM | POA: Diagnosis present

## 2017-10-19 DIAGNOSIS — I130 Hypertensive heart and chronic kidney disease with heart failure and stage 1 through stage 4 chronic kidney disease, or unspecified chronic kidney disease: Secondary | ICD-10-CM | POA: Diagnosis present

## 2017-10-19 DIAGNOSIS — Z8249 Family history of ischemic heart disease and other diseases of the circulatory system: Secondary | ICD-10-CM | POA: Diagnosis not present

## 2017-10-19 DIAGNOSIS — Z23 Encounter for immunization: Secondary | ICD-10-CM

## 2017-10-19 DIAGNOSIS — Z9861 Coronary angioplasty status: Secondary | ICD-10-CM | POA: Diagnosis not present

## 2017-10-19 DIAGNOSIS — K5792 Diverticulitis of intestine, part unspecified, without perforation or abscess without bleeding: Secondary | ICD-10-CM | POA: Diagnosis present

## 2017-10-19 DIAGNOSIS — K219 Gastro-esophageal reflux disease without esophagitis: Secondary | ICD-10-CM | POA: Diagnosis present

## 2017-10-19 DIAGNOSIS — I5022 Chronic systolic (congestive) heart failure: Secondary | ICD-10-CM | POA: Diagnosis present

## 2017-10-19 DIAGNOSIS — I255 Ischemic cardiomyopathy: Secondary | ICD-10-CM | POA: Diagnosis present

## 2017-10-19 DIAGNOSIS — R079 Chest pain, unspecified: Secondary | ICD-10-CM | POA: Diagnosis present

## 2017-10-19 DIAGNOSIS — R61 Generalized hyperhidrosis: Secondary | ICD-10-CM | POA: Diagnosis not present

## 2017-10-19 DIAGNOSIS — I10 Essential (primary) hypertension: Secondary | ICD-10-CM | POA: Diagnosis present

## 2017-10-19 DIAGNOSIS — I959 Hypotension, unspecified: Secondary | ICD-10-CM | POA: Diagnosis not present

## 2017-10-19 DIAGNOSIS — Z955 Presence of coronary angioplasty implant and graft: Secondary | ICD-10-CM | POA: Diagnosis not present

## 2017-10-19 DIAGNOSIS — Z7902 Long term (current) use of antithrombotics/antiplatelets: Secondary | ICD-10-CM

## 2017-10-19 DIAGNOSIS — I200 Unstable angina: Secondary | ICD-10-CM | POA: Diagnosis not present

## 2017-10-19 DIAGNOSIS — Z87891 Personal history of nicotine dependence: Secondary | ICD-10-CM | POA: Diagnosis not present

## 2017-10-19 DIAGNOSIS — R0789 Other chest pain: Secondary | ICD-10-CM | POA: Diagnosis not present

## 2017-10-19 DIAGNOSIS — I25110 Atherosclerotic heart disease of native coronary artery with unstable angina pectoris: Secondary | ICD-10-CM | POA: Diagnosis not present

## 2017-10-19 LAB — CBC
HEMATOCRIT: 37.6 % — AB (ref 39.0–52.0)
HEMOGLOBIN: 12.5 g/dL — AB (ref 13.0–17.0)
MCH: 30.9 pg (ref 26.0–34.0)
MCHC: 33.2 g/dL (ref 30.0–36.0)
MCV: 93.1 fL (ref 78.0–100.0)
Platelets: 226 10*3/uL (ref 150–400)
RBC: 4.04 MIL/uL — ABNORMAL LOW (ref 4.22–5.81)
RDW: 13.5 % (ref 11.5–15.5)
WBC: 5.7 10*3/uL (ref 4.0–10.5)

## 2017-10-19 LAB — BASIC METABOLIC PANEL
Anion gap: 7 (ref 5–15)
BUN: 16 mg/dL (ref 8–23)
CO2: 23 mmol/L (ref 22–32)
Calcium: 9 mg/dL (ref 8.9–10.3)
Chloride: 108 mmol/L (ref 98–111)
Creatinine, Ser: 1.31 mg/dL — ABNORMAL HIGH (ref 0.61–1.24)
GFR calc Af Amer: >60 mL/min (ref >60)
GFR calc non Af Amer: 53 mL/min — ABNORMAL LOW (ref >60)
GLUCOSE: 107 mg/dL — AB (ref 70–99)
POTASSIUM: 4 mmol/L (ref 3.5–5.1)
Sodium: 138 mmol/L (ref 135–145)

## 2017-10-19 LAB — TROPONIN I: Troponin I: <0.03 ng/mL (ref <0.03)

## 2017-10-19 LAB — I-STAT TROPONIN, ED: Troponin i, poc: 0.03 ng/mL (ref 0.00–0.08)

## 2017-10-19 MED ORDER — ONDANSETRON HCL 4 MG/2ML IJ SOLN: 4.0000 mg | Freq: Four times a day (QID) | INTRAMUSCULAR | Status: DC | PRN

## 2017-10-19 MED ORDER — DICYCLOMINE HCL 20 MG PO TABS
20.0000 mg | ORAL_TABLET | Freq: Two times a day (BID) | ORAL | Status: DC
  Administered 2017-10-20 – 2017-10-21 (×2): 20 mg via ORAL
  Filled 2017-10-19 (×3): qty 1

## 2017-10-19 MED ORDER — SACUBITRIL-VALSARTAN 97-103 MG PO TABS
1.0000 | ORAL_TABLET | Freq: Two times a day (BID) | ORAL | Status: DC
  Administered 2017-10-20 – 2017-10-21 (×4): 1 via ORAL
  Filled 2017-10-19 (×6): qty 1

## 2017-10-19 MED ORDER — ENOXAPARIN SODIUM 40 MG/0.4ML ~~LOC~~ SOLN
40.0000 mg | SUBCUTANEOUS | Status: DC
  Filled 2017-10-19 (×2): qty 0.4

## 2017-10-19 MED ORDER — CIPROFLOXACIN HCL 500 MG PO TABS
500.0000 mg | ORAL_TABLET | Freq: Two times a day (BID) | ORAL | Status: DC
  Administered 2017-10-19 – 2017-10-21 (×4): 500 mg via ORAL
  Filled 2017-10-19 (×5): qty 1

## 2017-10-19 MED ORDER — OMEGA-3-ACID ETHYL ESTERS 1 G PO CAPS
1.0000 g | ORAL_CAPSULE | Freq: Every day | ORAL | Status: DC
  Administered 2017-10-20 – 2017-10-21 (×2): 1 g via ORAL
  Filled 2017-10-19 (×2): qty 1

## 2017-10-19 MED ORDER — ZOLPIDEM TARTRATE 5 MG PO TABS: 5.0000 mg | ORAL_TABLET | Freq: Every evening | ORAL | Status: DC | PRN

## 2017-10-19 MED ORDER — VITAMIN B-12 1000 MCG PO TABS
5000.0000 ug | ORAL_TABLET | Freq: Every day | ORAL | Status: DC
  Administered 2017-10-21: 5000 ug via ORAL
  Filled 2017-10-19: qty 5

## 2017-10-19 MED ORDER — LORAZEPAM 0.5 MG PO TABS: 0.5000 mg | ORAL_TABLET | Freq: Three times a day (TID) | ORAL | Status: DC | PRN

## 2017-10-19 MED ORDER — METRONIDAZOLE 500 MG PO TABS
500.0000 mg | ORAL_TABLET | Freq: Three times a day (TID) | ORAL | Status: DC
  Administered 2017-10-19 – 2017-10-21 (×4): 500 mg via ORAL
  Filled 2017-10-19 (×6): qty 1

## 2017-10-19 MED ORDER — MORPHINE SULFATE (PF) 2 MG/ML IV SOLN
1.0000 mg | INTRAVENOUS | Status: DC | PRN
  Administered 2017-10-19: 3 mg via INTRAVENOUS
  Filled 2017-10-19: qty 2

## 2017-10-19 MED ORDER — PANTOPRAZOLE SODIUM 40 MG PO TBEC
40.0000 mg | DELAYED_RELEASE_TABLET | Freq: Every day | ORAL | Status: DC
  Administered 2017-10-20 – 2017-10-21 (×2): 40 mg via ORAL
  Filled 2017-10-19 (×2): qty 1

## 2017-10-19 MED ORDER — POLYETHYLENE GLYCOL 3350 17 G PO PACK
17.0000 g | PACK | Freq: Every day | ORAL | Status: DC
  Administered 2017-10-19: 17 g via ORAL
  Filled 2017-10-19 (×2): qty 1

## 2017-10-19 MED ORDER — SACCHAROMYCES BOULARDII 250 MG PO CAPS
250.0000 mg | ORAL_CAPSULE | Freq: Every day | ORAL | Status: DC
  Administered 2017-10-20 – 2017-10-21 (×2): 250 mg via ORAL
  Filled 2017-10-19 (×2): qty 1

## 2017-10-19 MED ORDER — ACETAMINOPHEN 325 MG PO TABS
650.0000 mg | ORAL_TABLET | ORAL | Status: DC | PRN
  Administered 2017-10-21: 11:00:00 650 mg via ORAL
  Filled 2017-10-19: qty 2

## 2017-10-19 MED ORDER — NITROGLYCERIN 0.4 MG SL SUBL: 0.4000 mg | SUBLINGUAL_TABLET | SUBLINGUAL | Status: DC | PRN

## 2017-10-19 MED ORDER — CARVEDILOL 3.125 MG PO TABS
3.1250 mg | ORAL_TABLET | Freq: Two times a day (BID) | ORAL | Status: DC
  Administered 2017-10-20 – 2017-10-21 (×2): 3.125 mg via ORAL
  Filled 2017-10-19 (×3): qty 1

## 2017-10-19 MED ORDER — CLOPIDOGREL BISULFATE 75 MG PO TABS
75.0000 mg | ORAL_TABLET | Freq: Every day | ORAL | Status: DC
  Administered 2017-10-20 – 2017-10-21 (×2): 75 mg via ORAL
  Filled 2017-10-19 (×2): qty 1

## 2017-10-19 NOTE — H&P (Signed)
History and Physical    [name] HER:[id number] DOB: 1945/10/04 DOA: 10/19/2017  PCP: Velna Hatchet, MD   Patient coming from: Home   Chief Complaint: Chest pain   HPI: [name] is a 72 y.o. male with medical history significant for coronary artery disease with stents, ischemic cardiomyopathy with EF 35%, and chronic kidney disease stage III, now presenting to the emergency department for evaluation of chest pain.  The patient reports that he has been experiencing intermittent chest discomfort over the past 2 to 3 days, much worse today after moving boxes at home.  Pain is described as a pressure sensation, radiating from the central chest towards the left shoulder, worse with exertion today and improved with nitroglycerin.  Denies any recent leg swelling or tenderness.  Reports some recent abdominal tenderness similar to his prior episodes of diverticulitis, and he was started on Cipro and Flagyl on 10/16/2017.  Abdominal symptoms have improved and he denies fevers or chills.  Denies melena or hematochezia.  ED Course: Upon arrival to the ED, patient is found to be afebrile, bradycardic in the 50s, saturating well on room air, and with vitals otherwise stable.  EKG features a sinus rhythm with T wave abnormalities that are similar to prior.  Chest x-ray is negative for acute cardiopulmonary disease.  Chemistry panel is notable for a creatinine of 1.31, slightly higher than prior.  Troponin is normal.  Patient remains hemodynamically stable, denies chest pain at this time, and will be observed on the telemetry unit for ongoing evaluation and management.  Review of Systems:  All other systems reviewed and apart from HPI, are negative.  Past Medical History:  Diagnosis Date  . Anxiety   . Arthritis    "wrists" (07/27/2014)  . Coronary artery disease    a.  Pt with 4 stents over the course of 2003-2012;  b.  LHC 6/16:  LAD, RCA, LCx stents patent, OM1 50%, EF 50-55% >> med rx   .  Elevated LFTs   . GERD (gastroesophageal reflux disease)   . Hypercholesterolemia   . Hypertension   . Ischemic cardiomyopathy    cMRI 03/2011:  EF 48% with dist ant, septal, apical and inf-apical dyskinesia, no apical clot, dist ant, apical and septal full thickness scar >> No ICD needed  //  Limited echo 7/19: Large apical septal aneurysm, severe hypokinesis of the entire apex and mid to apical segments of the anterior wall, anterolateral wall and anterior septum-consistent with infarct and most of the LAD territory; EF 35   . Left ventricular aneurysm    Limited echo 7/19: Large apical septal aneurysm, severe hypokinesis of the entire apex and mid to apical segments of the anterior wall, anterolateral wall and anterior septum-consistent with infarct and most of the LAD territory; EF 35  . Myocardial infarction [facility]) 2003    Past Surgical History:  Procedure Laterality Date  . CARDIAC CATHETERIZATION  06/11/2012   Nonobstructive CAD, patent stents, EF 50%, mild apical dyskinesis  . CARDIAC CATHETERIZATION N/A 07/28/2014   Procedure: Left Heart Cath and Coronary Angiography;  Surgeon: Troy Sine, MD;  Location: [place] [facility];  Service: Cardiovascular;  Laterality: N/A;  . CORONARY ANGIOPLASTY WITH STENT PLACEMENT  2003-2012   total of 4 stents place  . COSMETIC SURGERY Left 1970's   "dog ripped of my ear"  . LEFT HEART CATHETERIZATION WITH CORONARY ANGIOGRAM N/A 06/11/2012   Procedure: LEFT HEART CATHETERIZATION WITH CORONARY ANGIOGRAM;  Surgeon: Peter M Martinique, MD;  Location: [place] CATH LAB;  Service: Cardiovascular;  Laterality: N/A;     reports that he has quit smoking. His smoking use included cigarettes. He has a 15.00 pack-year smoking history. He has never used smokeless tobacco. He reports that he drinks about 2.0 standard drinks of alcohol per week. He reports that he does not use drugs.  Allergies  Allergen Reactions  . Asa [Aspirin] Other (See Comments)    Abdominal  bleeding  . Penicillins Anaphylaxis    Has patient had a PCN reaction causing immediate rash, facial/tongue/throat swelling, SOB or lightheadedness with hypotension: Yes Has patient had a PCN reaction causing severe rash involving mucus membranes or skin necrosis: No Has patient had a PCN reaction that required hospitalization: No Has patient had a PCN reaction occurring within the last 10 years: No If all of the above answers are "NO", then may proceed with Cephalosporin use.  . Statins Other (See Comments)    Stiff joints, muscle tightness, couldn't walk    Family History  Problem Relation Age of Onset  . Coronary artery disease Mother   . Coronary artery disease Father      Prior to Admission medications   Medication Sig Start Date End Date Taking? Authorizing Provider  carvedilol (COREG) 3.125 MG tablet Take 1 tablet (3.125 mg total) by mouth 2 (two) times daily with a meal. 09/30/17  Yes Josue Hector, MD  ciprofloxacin (CIPRO) 500 MG tablet Take 500 mg by mouth 2 (two) times daily. 7 day course started 10/16/17 pm 10/16/17  Yes [provider]  clopidogrel (PLAVIX) 75 MG tablet Take 1 tablet (75 mg total) by mouth daily. 06/11/12  Yes Arguello, Roger A, PA-C  Cyanocobalamin (VITAMIN B-12) 5000 MCG LOZG Take 5,000 Units by mouth daily.   Yes [provider]  dicyclomine (BENTYL) 20 MG tablet Take 20 mg by mouth 2 (two) times daily after a meal.    Yes [provider]  LORazepam (ATIVAN) 0.5 MG tablet Take 1 tablet (0.5 mg total) by mouth every 8 (eight) hours as needed. For anxiety Patient taking differently: Take 0.5 mg by mouth every 8 (eight) hours as needed for anxiety.  07/06/12  Yes Josue Hector, MD  metroNIDAZOLE (FLAGYL) 500 MG tablet Take 500 mg by mouth 3 (three) times daily. 7 day course filled 10/16/17 10/16/17  Yes [provider]  nitroGLYCERIN (NITROSTAT) 0.4 MG SL tablet Place 0.4 mg under the tongue every 5 (five) minutes as needed  for chest pain. For a max of 3 doses. If no relief, call 911.   Yes [provider]  omega-3 acid ethyl esters (LOVAZA) 1 G capsule Take 1 g by mouth daily.   Yes [provider]  pantoprazole (PROTONIX) 40 MG tablet Take 1 tablet (40 mg total) by mouth daily. 04/26/12  Yes Josue Hector, MD  polyethylene glycol (MIRALAX / GLYCOLAX) packet Take 17 g by mouth at bedtime. Mix in 8 oz water and drink   Yes [provider]  Probiotic Product (ALIGN) 4 MG CAPS Take 4 mg by mouth daily.    Yes [provider]  sacubitril-valsartan (ENTRESTO) 97-103 MG Take 1 tablet by mouth 2 (two) times daily. 10/05/17  Yes Josue Hector, MD  vitamin C (ASCORBIC ACID) 500 MG tablet Take 500 mg by mouth daily.    Yes [provider]  zolpidem (AMBIEN) 10 MG tablet Take 1 tablet (10 mg total) by mouth at bedtime as needed. For sleep Patient taking  differently: Take 5 mg by mouth at bedtime as needed for sleep.  07/06/12  Yes Josue Hector, MD  OVER THE COUNTER MEDICATION Take 1 capsule by mouth 3 (three) times daily. Herbal supplement from Dr. Ardeth Perfect  - to strengthen intestinal lining    [provider]  spironolactone (ALDACTONE) 25 MG tablet Take 25 mg by mouth daily.  12/31/10 08/25/17  [provider]    Physical Exam: Vitals:   10/19/17 2000 10/19/17 2015 10/19/17 2030 10/19/17 2045  BP: (!) 101/58 101/60 132/75 (!) 127/55  Pulse: (!) 51 (!) 55 (!) 50 (!) 51  Resp: 11 11 15 15   Temp:      TempSrc:      SpO2: 96% 97% 98% 97%  Weight:      Height:          Constitutional: NAD, calm  Eyes: PERTLA, lids and conjunctivae normal ENMT: Mucous membranes are moist. Posterior pharynx clear of any exudate or lesions.   Neck: normal, supple, no masses, no thyromegaly Respiratory: clear to auscultation bilaterally, no wheezing, no crackles. Normal respiratory effort.   Cardiovascular: S1 & S2 heard, regular rate and rhythm. No extremity edema.     Abdomen: No distension, no tenderness, soft. Bowel sounds normal.  Musculoskeletal: no clubbing / cyanosis. No joint deformity upper and lower extremities.    Skin: no significant rashes, lesions, ulcers. Warm, dry, well-perfused. Neurologic: CN 2-12 grossly intact. Sensation intact. Strength 5/5 in all 4 limbs.  Psychiatric: Alert and oriented x 3. Calm, cooperative.     Labs on Admission: I have personally reviewed following labs and imaging studies  CBC: Recent Labs  Lab 10/19/17 1940  WBC 5.7  HGB 12.5*  HCT 37.6*  MCV 93.1  PLT 161   Basic Metabolic Panel: Recent Labs  Lab 10/19/17 1940  NA 138  K 4.0  CL 108  CO2 23  GLUCOSE 107*  BUN 16  CREATININE 1.31*  CALCIUM 9.0   GFR: Estimated Creatinine Clearance: 52.8 mL/min (A) (by C-G formula based on SCr of 1.31 mg/dL (H)). Liver Function Tests: No results for input(s): AST, ALT, ALKPHOS, BILITOT, PROT, ALBUMIN in the last 168 hours. No results for input(s): LIPASE, AMYLASE in the last 168 hours. No results for input(s): AMMONIA in the last 168 hours. Coagulation Profile: No results for input(s): INR, PROTIME in the last 168 hours. Cardiac Enzymes: No results for input(s): CKTOTAL, CKMB, CKMBINDEX, TROPONINI in the last 168 hours. BNP (last 3 results) No results for input(s): PROBNP in the last 8760 hours. HbA1C: No results for input(s): HGBA1C in the last 72 hours. CBG: No results for input(s): GLUCAP in the last 168 hours. Lipid Profile: No results for input(s): CHOL, HDL, LDLCALC, TRIG, CHOLHDL, LDLDIRECT in the last 72 hours. Thyroid Function Tests: No results for input(s): TSH, T4TOTAL, FREET4, T3FREE, THYROIDAB in the last 72 hours. Anemia Panel: No results for input(s): VITAMINB12, FOLATE, FERRITIN, TIBC, IRON, RETICCTPCT in the last 72 hours. Urine analysis:    Component Value Date/Time   COLORURINE AMBER (A) 10/22/2013 1225   APPEARANCEUR CLEAR 10/22/2013 1225   LABSPEC 1.021 10/22/2013 1225    PHURINE 5.0 10/22/2013 1225   GLUCOSEU NEGATIVE 10/22/2013 1225   HGBUR SMALL (A) 10/22/2013 1225   BILIRUBINUR NEGATIVE 10/22/2013 1225   KETONESUR NEGATIVE 10/22/2013 1225   PROTEINUR NEGATIVE 10/22/2013 1225   UROBILINOGEN 1.0 10/22/2013 1225   NITRITE NEGATIVE 10/22/2013 1225   LEUKOCYTESUR TRACE (A) 10/22/2013 1225   Sepsis Labs: @LABRCNTIP (procalcitonin:4,lacticidven:4) )No  results found for this or any previous visit (from the past 240 hour(s)).   Radiological Exams on Admission: Dg Chest 2 View  Result Date: 10/19/2017 CLINICAL DATA:  72 year old male with chest pain. EXAM: CHEST - 2 VIEW COMPARISON:  Chest radiograph dated 11/09/2015 FINDINGS: The lungs are clear. There is no pleural effusion or pneumothorax. The cardiac silhouette is within normal limits. Coronary vascular calcifications noted. No acute osseous pathology. IMPRESSION: 1. No acute cardiopulmonary process. 2. Coronary vascular calcification. Electronically Signed   By: Anner Crete M.D.   On: 10/19/2017 21:06    EKG: Independently reviewed. Sinus rhythm, non-specific T-wave abnormalities, similar to prior.   Assessment/Plan   1. Chest pain; CAD  - Patient has hx of CAD with stents, refused a recommended stress test last month per notes, and now presenting with 2-3 days of intermittent chest pressure, worse today after moving boxes and improved with NTG   - EKG does not appear significantly changed, CXR unremarkable, and initial troponin normal  - He reports allergy to ASA and statin intolerance  - Continue cardiac monitoring, obtain serial troponin measurements, repeat EKG, continue beta-blocker and Plavix   2. Chronic systolic CHF  - Appears compensated  - Continue Coreg and Entresto, follow daily wts   3. CKD stage III  - SCr is 1.31 on admission, similar to priors  - Renally-dose medications    4. Diverticulitis  - Patient has hx of diverticulitis, was experiencing recurrent symptoms, and started  on Cipro and Flagyl on 8/23  - Sxs improved, no fever or leukocytosis, no rectal bleeding  - Continue Cipro and Flagyl     DVT prophylaxis: Lovenox Code Status: Full  Family Communication: Discussed with patient  Consults called: None Admission status: Observation     Vianne Bulls, MD [facility] Pager [phone]  If 7PM-7AM, please contact night-coverage [email or web address] Password [facility]  10/19/2017, 9:42 PM

## 2017-10-19 NOTE — ED Notes (Addendum)
Patient reports 1/10 chest pressure/tightness. MD Opyd notified.

## 2017-10-19 NOTE — ED Triage Notes (Addendum)
Patient BIB [facility] for chest pain x 1 hour. Patient states he was moving boxes today and all of the sudden felt chest "tightness/pressure" medial chest. Denies SOB, N/V, or diaphoresis. Patient reports having stents placed 2001, 2009, and 2012. Given 2 NTG with mild relief.   BP 125/55 HR 70 99 % RA  CBG 121

## 2017-10-19 NOTE — ED Notes (Signed)
Attempted report x1. 

## 2017-10-19 NOTE — ED Notes (Signed)
Patient transported to X-ray 

## 2017-10-19 NOTE — ED Notes (Signed)
Attempted report to floor. Patient reports 2/10 chest tightness. Admitting MD notified. Patient given PRN morphine will re-evaluate.

## 2017-10-19 NOTE — ED Notes (Signed)
Patient given turkey sandwich and PO fluids.  

## 2017-10-19 NOTE — ED Provider Notes (Signed)
[facility] EMERGENCY DEPARTMENT Provider Note   CSN: [id number] Arrival date & time: 10/19/17  1923     History   Chief Complaint Chief Complaint  Patient presents with  . Chest Pain    HPI [name] is a 72 y.o. male.  [name] is a 72 y.o. Male with CAD, with 4 stents placed between 2003-2012, ischemic cardiomyopathy with EF of 35, hypertension, hyperlipidemia, GERD, who presents to the emergency department for evaluation of chest pain.  Patient reports over the past 2 to 3 days he had intermittent left-sided chest pressure, but this became much more severe this evening after helping his significant other carry boxes into the attic.  Patient reports severe chest pressure radiating into the left arm and shoulder, which was persistent until patient took sublingual nitro and was given additional dose with EMS.  No aspirin as patient is allergic.  Patient reports he did feel lightheaded with this episode of chest pain, had minimal shortness of breath, and was diaphoretic.  He denies nausea or vomiting, no associated abdominal pain.  He reports chest pain is now easing off, he has no shortness of breath.  Patient reports he has been increasing his home doses of Entresto, does not feel that he has had increased swelling in his legs or abdomen, and his fluid status seems to be normal.  Patient has smoking history but does not currently smoke.  Has approximately 2 standard drinks of alcohol per week.  Known history of coronary artery disease with multiple stents placed, most recent EF 35% as of 7/19.  Patient is followed by Dr. Johnsie Cancel with cardiology  The history is provided by the patient.    Past Medical History:  Diagnosis Date  . Anxiety   . Arthritis    "wrists" (07/27/2014)  . Coronary artery disease    a.  Pt with 4 stents over the course of 2003-2012;  b.  LHC 6/16:  LAD, RCA, LCx stents patent, OM1 50%, EF 50-55% >> med rx   . Elevated LFTs   . GERD  (gastroesophageal reflux disease)   . Hypercholesterolemia   . Hypertension   . Ischemic cardiomyopathy    cMRI 03/2011:  EF 48% with dist ant, septal, apical and inf-apical dyskinesia, no apical clot, dist ant, apical and septal full thickness scar >> No ICD needed  //  Limited echo 7/19: Large apical septal aneurysm, severe hypokinesis of the entire apex and mid to apical segments of the anterior wall, anterolateral wall and anterior septum-consistent with infarct and most of the LAD territory; EF 35   . Left ventricular aneurysm    Limited echo 7/19: Large apical septal aneurysm, severe hypokinesis of the entire apex and mid to apical segments of the anterior wall, anterolateral wall and anterior septum-consistent with infarct and most of the LAD territory; EF 35  . Myocardial infarction [facility]) 2003    Patient Active Problem List   Diagnosis Date Noted  . CKD (chronic kidney disease), stage III ([place]) 10/19/2017  . Diverticulitis 10/19/2017  . Chest pain   . Low testosterone 06/02/2014  . Unstable angina ([place]) 06/11/2012  . CAD S/P percutaneous coronary angioplasty 01/07/2011    Class: Chronic  . Angina pectoris 01/07/2011    Class: Acute  . Hyperlipidemia with target LDL less than 100 01/07/2011    Class: Chronic  . Hypertension 01/07/2011    Class: Chronic  . Ischemic cardiomyopathy 01/07/2011    Class: Chronic  . Previous myocardial infarction  older than 8 weeks 01/07/2011    Class: History of    Past Surgical History:  Procedure Laterality Date  . CARDIAC CATHETERIZATION  06/11/2012   Nonobstructive CAD, patent stents, EF 50%, mild apical dyskinesis  . CARDIAC CATHETERIZATION N/A 07/28/2014   Procedure: Left Heart Cath and Coronary Angiography;  Surgeon: Troy Sine, MD;  Location: [facility] CV LAB;  Service: Cardiovascular;  Laterality: N/A;  . CORONARY ANGIOPLASTY WITH STENT PLACEMENT  2003-2012   total of 4 stents place  . COSMETIC SURGERY Left 1970's   "dog ripped  of my ear"  . LEFT HEART CATHETERIZATION WITH CORONARY ANGIOGRAM N/A 06/11/2012   Procedure: LEFT HEART CATHETERIZATION WITH CORONARY ANGIOGRAM;  Surgeon: Peter M Martinique, MD;  Location: [facility] CATH LAB;  Service: Cardiovascular;  Laterality: N/A;        Home Medications    Prior to Admission medications   Medication Sig Start Date End Date Taking? Authorizing Provider  carvedilol (COREG) 3.125 MG tablet Take 1 tablet (3.125 mg total) by mouth 2 (two) times daily with a meal. 09/30/17  Yes Josue Hector, MD  ciprofloxacin (CIPRO) 500 MG tablet Take 500 mg by mouth 2 (two) times daily. 7 day course started 10/16/17 pm 10/16/17  Yes [provider]  clopidogrel (PLAVIX) 75 MG tablet Take 1 tablet (75 mg total) by mouth daily. 06/11/12  Yes Arguello, Roger A, PA-C  Cyanocobalamin (VITAMIN B-12) 5000 MCG LOZG Take 5,000 Units by mouth daily.   Yes [provider]  dicyclomine (BENTYL) 20 MG tablet Take 20 mg by mouth 2 (two) times daily after a meal.    Yes [provider]  LORazepam (ATIVAN) 0.5 MG tablet Take 1 tablet (0.5 mg total) by mouth every 8 (eight) hours as needed. For anxiety Patient taking differently: Take 0.5 mg by mouth every 8 (eight) hours as needed for anxiety.  07/06/12  Yes Josue Hector, MD  metroNIDAZOLE (FLAGYL) 500 MG tablet Take 500 mg by mouth 3 (three) times daily. 7 day course filled 10/16/17 10/16/17  Yes [provider]  nitroGLYCERIN (NITROSTAT) 0.4 MG SL tablet Place 0.4 mg under the tongue every 5 (five) minutes as needed for chest pain. For a max of 3 doses. If no relief, call 911.   Yes [provider]  omega-3 acid ethyl esters (LOVAZA) 1 G capsule Take 1 g by mouth daily.   Yes [provider]  pantoprazole (PROTONIX) 40 MG tablet Take 1 tablet (40 mg total) by mouth daily. 04/26/12  Yes Josue Hector, MD  polyethylene glycol (MIRALAX / GLYCOLAX) packet Take 17 g by mouth at bedtime. Mix in 8 oz water and drink   Yes  [provider]  Probiotic Product (ALIGN) 4 MG CAPS Take 4 mg by mouth daily.    Yes [provider]  sacubitril-valsartan (ENTRESTO) 97-103 MG Take 1 tablet by mouth 2 (two) times daily. 10/05/17  Yes Josue Hector, MD  vitamin C (ASCORBIC ACID) 500 MG tablet Take 500 mg by mouth daily.    Yes [provider]  zolpidem (AMBIEN) 10 MG tablet Take 1 tablet (10 mg total) by mouth at bedtime as needed. For sleep Patient taking differently: Take 5 mg by mouth at bedtime as needed for sleep.  07/06/12  Yes Josue Hector, MD  OVER THE COUNTER MEDICATION Take 1 capsule by mouth 3 (three) times daily. Herbal supplement from Dr. Ardeth Perfect  - to strengthen intestinal lining    [provider]  spironolactone (ALDACTONE) 25 MG tablet Take 25 mg by mouth daily.  12/31/10 08/25/17  [provider]    Family History Family History  Problem Relation Age of Onset  . Coronary artery disease Mother   . Coronary artery disease Father     Social History Social History   Tobacco Use  . Smoking status: Former Smoker    Packs/day: 1.00    Years: 15.00    Pack years: 15.00    Types: Cigarettes  . Smokeless tobacco: Never Used  . Tobacco comment: "quit smoking cigarettes in 1979"  Substance Use Topics  . Alcohol use: Yes    Alcohol/week: 2.0 standard drinks    Types: 2 Glasses of wine per week  . Drug use: No     Allergies   Asa [aspirin]; Penicillins; and Statins   Review of Systems Review of Systems  Constitutional: Positive for diaphoresis. Negative for chills and fever.  HENT: Negative.   Eyes: Negative for visual disturbance.  Respiratory: Positive for shortness of breath. Negative for cough, chest tightness and wheezing.   Cardiovascular: Positive for chest pain. Negative for palpitations and leg swelling.  Gastrointestinal: Negative for abdominal pain, nausea and vomiting.  Genitourinary: Negative for dysuria and frequency.  Musculoskeletal:  Negative for arthralgias and myalgias.  Skin: Negative for color change and rash.  Neurological: Negative for dizziness, syncope, weakness, light-headedness, numbness and headaches.     Physical Exam Updated Vital Signs BP (!) 141/84   Pulse 69   Temp 98.6 F (37 C) (Oral)   Resp 15   Ht 5\' 7"  (1.702 m)   Wt 83.9 kg   SpO2 97%   BMI 28.98 kg/m   Physical Exam  Constitutional: He is oriented to person, place, and time. He appears well-developed and well-nourished. No distress.  HENT:  Head: Normocephalic and atraumatic.  Eyes: Pupils are equal, round, and reactive to light. EOM are normal. Right eye exhibits no discharge. Left eye exhibits no discharge.  Neck: Normal range of motion. Neck supple. No JVD present. No tracheal deviation present.  Cardiovascular: Normal rate, regular rhythm, normal heart sounds and intact distal pulses. Exam reveals no gallop and no friction rub.  No murmur heard. Pulses:      Radial pulses are 2+ on the right side, and 2+ on the left side.       Dorsalis pedis pulses are 2+ on the right side, and 2+ on the left side.       Posterior tibial pulses are 2+ on the right side, and 2+ on the left side.  Pulmonary/Chest: Effort normal and breath sounds normal. No respiratory distress.  Respirations equal and unlabored, patient able to speak in full sentences, lungs clear to auscultation bilaterally  Musculoskeletal: He exhibits no edema or deformity.       Right lower leg: Normal. He exhibits no tenderness and no edema.       Left lower leg: Normal. He exhibits no tenderness and no edema.  Neurological: He is alert and oriented to person, place, and time. Coordination normal.  Speech is clear, able to follow commands CN III-XII intact Normal strength in upper and lower extremities bilaterally including dorsiflexion and plantar flexion, strong and equal grip strength Sensation normal to light and sharp touch Moves extremities without ataxia, coordination  intact  Skin: Skin is warm and dry. Capillary refill takes less than 2 seconds. He is not diaphoretic.  Psychiatric: He has a normal mood and affect. His behavior  is normal.  Nursing note and vitals reviewed.    ED Treatments / Results  Labs (all labs ordered are listed, but only abnormal results are displayed) Labs Reviewed  BASIC METABOLIC PANEL - Abnormal; Notable for the following components:      Result Value   Glucose, Bld 107 (*)    Creatinine, Ser 1.31 (*)    GFR calc non Af Amer 53 (*)    All other components within normal limits  CBC - Abnormal; Notable for the following components:   RBC 4.04 (*)    Hemoglobin 12.5 (*)    HCT 37.6 (*)    All other components within normal limits  TROPONIN I  TROPONIN I  TROPONIN I  I-STAT TROPONIN, ED    EKG EKG Interpretation  Date/Time:  Monday October 19 2017 19:28:16 EDT Ventricular Rate:  69 PR Interval:    QRS Duration: 90 QT Interval:  354 QTC Calculation: 380 R Axis:   -87 Text Interpretation:  Sinus rhythm Inferior infarct, old Abnormal lateral Q waves Anterior infarct, old No significant change since last tracing Confirmed by Dorie Rank [phone]) on 10/19/2017 7:44:52 PM   Radiology Dg Chest 2 View  Result Date: 10/19/2017 CLINICAL DATA:  72 year old male with chest pain. EXAM: CHEST - 2 VIEW COMPARISON:  Chest radiograph dated 11/09/2015 FINDINGS: The lungs are clear. There is no pleural effusion or pneumothorax. The cardiac silhouette is within normal limits. Coronary vascular calcifications noted. No acute osseous pathology. IMPRESSION: 1. No acute cardiopulmonary process. 2. Coronary vascular calcification. Electronically Signed   By: Anner Crete M.D.   On: 10/19/2017 21:06    Procedures Procedures (including critical care time)  Medications Ordered in ED None   Initial Impression / Assessment and Plan / ED Course  I have reviewed the triage vital signs and the nursing notes.  Pertinent labs & imaging  results that were available during my care of the patient were reviewed by me and considered in my medical decision making (see chart for details).  Concern for cardiac etiology of Chest Pain. Pt with hx of CAD with multiple stents and ischemic cardiomyopathy, CP today worsened with exertion and relieved with SL nitro.  No acute abnormalities found on EKG and first round of cardiac enzymes negative.  Chest x-ray clear, no leukocytosis, stable hemoglobin and no acute electrolyte derangements requiring intervention.  Pt has been re-evaluated prior to consult and VSS, NAD, heart RRR, pain 0/10, lungs CTAB.This case was discussed with Dr. Mitzi Hansen who has seen the patient and agrees with plan to admit.    Final Clinical Impressions(s) / ED Diagnoses   Final diagnoses:  Chest pain, unspecified type    ED Discharge Orders    None       Janet Berlin 10/20/17 0124    Dorie Rank, MD 10/21/17 1122

## 2017-10-20 ENCOUNTER — Encounter (HOSPITAL_COMMUNITY): Payer: Self-pay

## 2017-10-20 ENCOUNTER — Encounter (HOSPITAL_COMMUNITY)
Admission: EM | Disposition: A | Discharge status: Home / Self Care | Payer: Self-pay | Source: Home / Self Care | Attending: Nephrology

## 2017-10-20 ENCOUNTER — Other Ambulatory Visit: Payer: Self-pay

## 2017-10-20 DIAGNOSIS — I200 Unstable angina: Secondary | ICD-10-CM | POA: Diagnosis not present

## 2017-10-20 DIAGNOSIS — I255 Ischemic cardiomyopathy: Secondary | ICD-10-CM

## 2017-10-20 DIAGNOSIS — Z955 Presence of coronary angioplasty implant and graft: Secondary | ICD-10-CM | POA: Diagnosis not present

## 2017-10-20 DIAGNOSIS — R079 Chest pain, unspecified: Secondary | ICD-10-CM | POA: Diagnosis not present

## 2017-10-20 DIAGNOSIS — E785 Hyperlipidemia, unspecified: Secondary | ICD-10-CM | POA: Diagnosis not present

## 2017-10-20 DIAGNOSIS — Z87891 Personal history of nicotine dependence: Secondary | ICD-10-CM | POA: Diagnosis not present

## 2017-10-20 DIAGNOSIS — I2510 Atherosclerotic heart disease of native coronary artery without angina pectoris: Secondary | ICD-10-CM

## 2017-10-20 DIAGNOSIS — I10 Essential (primary) hypertension: Secondary | ICD-10-CM

## 2017-10-20 DIAGNOSIS — I25110 Atherosclerotic heart disease of native coronary artery with unstable angina pectoris: Secondary | ICD-10-CM

## 2017-10-20 DIAGNOSIS — N183 Chronic kidney disease, stage 3 (moderate): Secondary | ICD-10-CM | POA: Diagnosis not present

## 2017-10-20 DIAGNOSIS — Z8249 Family history of ischemic heart disease and other diseases of the circulatory system: Secondary | ICD-10-CM | POA: Diagnosis not present

## 2017-10-20 DIAGNOSIS — I25119 Atherosclerotic heart disease of native coronary artery with unspecified angina pectoris: Secondary | ICD-10-CM | POA: Diagnosis not present

## 2017-10-20 DIAGNOSIS — Z9861 Coronary angioplasty status: Secondary | ICD-10-CM | POA: Diagnosis not present

## 2017-10-20 DIAGNOSIS — I5022 Chronic systolic (congestive) heart failure: Secondary | ICD-10-CM | POA: Diagnosis not present

## 2017-10-20 DIAGNOSIS — K5792 Diverticulitis of intestine, part unspecified, without perforation or abscess without bleeding: Secondary | ICD-10-CM | POA: Diagnosis not present

## 2017-10-20 DIAGNOSIS — I130 Hypertensive heart and chronic kidney disease with heart failure and stage 1 through stage 4 chronic kidney disease, or unspecified chronic kidney disease: Secondary | ICD-10-CM | POA: Diagnosis not present

## 2017-10-20 HISTORY — PX: LEFT HEART CATH AND CORONARY ANGIOGRAPHY: CATH118249

## 2017-10-20 HISTORY — PX: CORONARY STENT INTERVENTION: CATH118234

## 2017-10-20 LAB — POCT ACTIVATED CLOTTING TIME
Activated Clotting Time: 307 seconds
Activated Clotting Time: 241 seconds

## 2017-10-20 LAB — TROPONIN I

## 2017-10-20 SURGERY — LEFT HEART CATH AND CORONARY ANGIOGRAPHY
Anesthesia: LOCAL

## 2017-10-20 MED ORDER — NITROGLYCERIN 1 MG/10 ML FOR IR/CATH LAB
INTRA_ARTERIAL | Status: AC
  Filled 2017-10-20: qty 10

## 2017-10-20 MED ORDER — ONDANSETRON HCL 4 MG/2ML IJ SOLN
INTRAMUSCULAR | Status: DC | PRN
  Administered 2017-10-20: 4 mg via INTRAVENOUS

## 2017-10-20 MED ORDER — MIDAZOLAM HCL 2 MG/2ML IJ SOLN
INTRAMUSCULAR | Status: DC | PRN
  Administered 2017-10-20 (×2): 1 mg via INTRAVENOUS

## 2017-10-20 MED ORDER — NITROGLYCERIN 1 MG/10 ML FOR IR/CATH LAB
INTRA_ARTERIAL | Status: DC | PRN
  Administered 2017-10-20: 150 ug via INTRACORONARY
  Administered 2017-10-20: 200 ug via INTRACORONARY

## 2017-10-20 MED ORDER — SODIUM CHLORIDE 0.9 % IV SOLN
INTRAVENOUS | Status: DC
  Administered 2017-10-20: 11:00:00 via INTRAVENOUS

## 2017-10-20 MED ORDER — IOHEXOL 350 MG/ML SOLN
INTRAVENOUS | Status: DC | PRN
  Administered 2017-10-20: 130 mL via INTRAVENOUS

## 2017-10-20 MED ORDER — ONDANSETRON HCL 4 MG/2ML IJ SOLN
INTRAMUSCULAR | Status: AC
  Filled 2017-10-20: qty 2

## 2017-10-20 MED ORDER — FENTANYL CITRATE (PF) 100 MCG/2ML IJ SOLN
INTRAMUSCULAR | Status: AC
  Filled 2017-10-20: qty 2

## 2017-10-20 MED ORDER — FENTANYL CITRATE (PF) 100 MCG/2ML IJ SOLN
INTRAMUSCULAR | Status: DC | PRN
  Administered 2017-10-20 (×2): 25 ug via INTRAVENOUS

## 2017-10-20 MED ORDER — HEPARIN SODIUM (PORCINE) 1000 UNIT/ML IJ SOLN
INTRAMUSCULAR | Status: AC
  Filled 2017-10-20: qty 1

## 2017-10-20 MED ORDER — HEPARIN (PORCINE) IN NACL 1000-0.9 UT/500ML-% IV SOLN
INTRAVENOUS | Status: AC
  Filled 2017-10-20: qty 1000

## 2017-10-20 MED ORDER — LIDOCAINE HCL (PF) 1 % IJ SOLN
INTRAMUSCULAR | Status: DC | PRN
  Administered 2017-10-20: 2 mL

## 2017-10-20 MED ORDER — HEPARIN SODIUM (PORCINE) 1000 UNIT/ML IJ SOLN
INTRAMUSCULAR | Status: DC | PRN
  Administered 2017-10-20: 5000 [IU] via INTRAVENOUS
  Administered 2017-10-20: 4000 [IU] via INTRAVENOUS

## 2017-10-20 MED ORDER — ATROPINE SULFATE 1 MG/10ML IJ SOSY
PREFILLED_SYRINGE | INTRAMUSCULAR | Status: DC | PRN
  Administered 2017-10-20: 1 mg via INTRAVENOUS

## 2017-10-20 MED ORDER — SODIUM CHLORIDE 0.9 % WEIGHT BASED INFUSION
1.0000 mL/kg/h | INTRAVENOUS | Status: AC
  Administered 2017-10-20: 1 mL/kg/h via INTRAVENOUS

## 2017-10-20 MED ORDER — MIDAZOLAM HCL 2 MG/2ML IJ SOLN
INTRAMUSCULAR | Status: AC
  Filled 2017-10-20: qty 2

## 2017-10-20 MED ORDER — LIDOCAINE HCL (PF) 1 % IJ SOLN
INTRAMUSCULAR | Status: AC
  Filled 2017-10-20: qty 30

## 2017-10-20 MED ORDER — VERAPAMIL HCL 2.5 MG/ML IV SOLN
INTRAVENOUS | Status: AC
  Filled 2017-10-20: qty 2

## 2017-10-20 MED ORDER — HEPARIN (PORCINE) IN NACL 1000-0.9 UT/500ML-% IV SOLN
INTRAVENOUS | Status: DC | PRN
  Administered 2017-10-20 (×2): 500 mL

## 2017-10-20 MED ORDER — ATROPINE SULFATE 1 MG/10ML IJ SOSY
PREFILLED_SYRINGE | INTRAMUSCULAR | Status: AC
  Filled 2017-10-20: qty 10

## 2017-10-20 MED ORDER — SODIUM CHLORIDE 0.9 % IV SOLN: 250.0000 mL | INTRAVENOUS | Status: DC | PRN

## 2017-10-20 MED ORDER — LABETALOL HCL 5 MG/ML IV SOLN: 10.0000 mg | INTRAVENOUS | Status: AC | PRN

## 2017-10-20 MED ORDER — SODIUM CHLORIDE 0.9% FLUSH: 3.0000 mL | INTRAVENOUS | Status: DC | PRN

## 2017-10-20 MED ORDER — HYDRALAZINE HCL 20 MG/ML IJ SOLN: 5.0000 mg | INTRAMUSCULAR | Status: AC | PRN

## 2017-10-20 MED ORDER — SODIUM CHLORIDE 0.9 % IV SOLN
INTRAVENOUS | Status: AC | PRN
  Administered 2017-10-20: 250 mL via INTRAVENOUS

## 2017-10-20 MED ORDER — CLOPIDOGREL BISULFATE 300 MG PO TABS
ORAL_TABLET | ORAL | Status: AC
  Filled 2017-10-20: qty 1

## 2017-10-20 MED ORDER — ANGIOPLASTY BOOK
Freq: Once | Status: AC
  Administered 2017-10-20: 22:00:00
  Filled 2017-10-20: qty 1

## 2017-10-20 MED ORDER — VERAPAMIL HCL 2.5 MG/ML IV SOLN
INTRAVENOUS | Status: DC | PRN
  Administered 2017-10-20: 10 mL via INTRA_ARTERIAL

## 2017-10-20 MED ORDER — SODIUM CHLORIDE 0.9% FLUSH: 3.0000 mL | Freq: Two times a day (BID) | INTRAVENOUS | Status: DC

## 2017-10-20 MED ORDER — PNEUMOCOCCAL VAC POLYVALENT 25 MCG/0.5ML IJ INJ
0.5000 mL | INJECTION | INTRAMUSCULAR | Status: AC
  Administered 2017-10-21: 0.5 mL via INTRAMUSCULAR
  Filled 2017-10-20: qty 0.5

## 2017-10-20 MED ORDER — CLOPIDOGREL BISULFATE 300 MG PO TABS
ORAL_TABLET | ORAL | Status: DC | PRN
  Administered 2017-10-20: 300 mg via ORAL

## 2017-10-20 MED ORDER — SODIUM CHLORIDE 0.9% FLUSH
3.0000 mL | Freq: Two times a day (BID) | INTRAVENOUS | Status: DC
  Administered 2017-10-20: 3 mL via INTRAVENOUS

## 2017-10-20 SURGICAL SUPPLY — 20 items
BALLN EMERGE MR 2.5X15 (BALLOONS) ×2
BALLN SAPPHIRE ~~LOC~~ 3.0X12 (BALLOONS) ×2 IMPLANT
BALLN ~~LOC~~ EMERGE MR 2.75X20 (BALLOONS) ×2
BALLOON EMERGE MR 2.5X15 (BALLOONS) ×1 IMPLANT
BALLOON ~~LOC~~ EMERGE MR 2.75X20 (BALLOONS) ×1 IMPLANT
CATH INFINITI 5 FR JL3.5 (CATHETERS) ×2 IMPLANT
CATH INFINITI JR4 5F (CATHETERS) ×2 IMPLANT
CATH LAUNCHER 6FR EBU3.5 (CATHETERS) ×2 IMPLANT
DEVICE RAD COMP TR BAND LRG (VASCULAR PRODUCTS) ×2 IMPLANT
GLIDESHEATH SLEND SS 6F .021 (SHEATH) ×2 IMPLANT
GUIDEWIRE INQWIRE 1.5J.035X260 (WIRE) ×1 IMPLANT
INQWIRE 1.5J .035X260CM (WIRE) ×2
KIT ENCORE 26 ADVANTAGE (KITS) ×2 IMPLANT
KIT HEART LEFT (KITS) ×2 IMPLANT
PACK CARDIAC CATHETERIZATION (CUSTOM PROCEDURE TRAY) ×2 IMPLANT
STENT RESOLUTE ONYX 2.5X38 (Permanent Stent) ×2 IMPLANT
STENT RESOLUTE ONYX 2.75X18 (Permanent Stent) ×2 IMPLANT
TRANSDUCER W/STOPCOCK (MISCELLANEOUS) ×2 IMPLANT
TUBING CIL FLEX 10 FLL-RA (TUBING) ×2 IMPLANT
WIRE COUGAR XT STRL 190CM (WIRE) ×2 IMPLANT

## 2017-10-20 NOTE — Progress Notes (Signed)
Transitions of Care Polypharmacy Medication Deprescribing Review  [name] is a 72 y.o. male admitted for chest pain on 10/19/2017.   The patient's PTA medications and comorbidities were reviewed to identify opportunities for medication optimization through deprescribing.   Short-Term Recommendations: . Consider discontinuing dicyclomine, as it has risks for anticholinergic adverse events and it is on the Beers List of medications to avoid in the elderly. Additionally, patient reports lack of improvement in symptoms on this medication.   Long-Term Recommendations: . Consider evaluating whether long-term zolpidem use is necessary for patient's insomnia. Consider trialing melatonin or trazodone to decrease risk for adverse effects from zolpidem use.  . Consider evaluating whether long-term lorazepam use is necessary for patient's anxiety, especially if PRN use increases, thus increasing the risk for adverse events.  . Consider decreasing dose of pantoprazole to 20 mg or re-evaluating if long-term proton pump inhibitor therapy is needed for safe clopidogrel use, as long-term PPI use carries risks for adverse events.    Patient Assessment:  . Extent of Polypharmacy: 14 PTA medications, hyperpolypharmacy (10+ meds) o Associated risk: moderate o Number of home Rx meds: 9 o Number of home OTC meds: 5 . Charlson Comorbidity Index (CCI): 5 o Associated risk: severe (21% estimated 10-year survival) . GerontoNet Adverse Drug Event Risk Score: 7 o Associated risk: moderate (~12% risk ADE)  Overall risk: moderate; hyperpolypharmacy only with combination of Rx and OTC meds, CCI driven mainly by age    Medication Assessment:  . Potentially Inappropriate Medications (PIMs)  o High risk medications: 3 - Lorazepam, zolpidem, dicyclomine o Moderate risk medications: 1 - Pantoprazole  o Low risk medications: 0 . Medications Associated with Geriatric Syndromes (MAGS): 7 o Carvedilol,  clopidogrel, lorazepam, pantoprazole, sacubitril-valsartan, zolpidem, dicyclomine  Overall risk: moderate to high; multiple PIMs, 7 of 9 prescribed meds are MAGS   The patient was interviewed to determine his interest in making medication changes. Shared decision-making was used to determine the optimal medications to deprescribe.   Patient Deprescribing Readiness: . Patient Opinions About Deprescribing survey score: 8 o Score of -8 to -6: negative patient opinion of deprescribing; deprescribing likely to be unsuccessful o Score of -4 to 4: intermediate patient opinion of deprescribing; deprescribing may be successful o Score of 6 to 8: positive patient opinion of deprescribing; deprescribing likely to be successful   Shared Decision-Making Discussion:  . Patient expressed interest in deprescribing, noting that he felt like he took a lot of medications every day. He was knowledgeable about medication names and usages, and was able to describe how he took them each day. . Patient expressed concerns about the following medications: carvedilol and dicyclomine. He reported not knowing what carvedilol was doing for him and expressed that he thought taking [name] was supposed to replace carvedilol, since [name] replaced his other blood pressure medicine. I advised him of the mortality benefits with carvedilol and explained that [name] is optimally used with a medication like carvedilol, and he expressed understanding and willingness to continue the medication. . Patient reported using dicyclomine scheduled twice daily, but he did not think it provided any relief for his stomach pain and symptoms. He was advised of the risks for anticholinergic adverse effects with dicyclomine, especially after long-term use. He expressed a desire to try an alternate medication for his ongoing stomach issues. . Patient was advised of the risks associated with frequent lorazepam and zolpidem use, but he expressed  using these medications just once or twice weekly and that he needed them  for his anxiety and insomnia. He denied trying alternate medications for anxiety or insomnia, although he reported trying an OTC sleep medicine before that did not help him. . Patient denied experiencing symptoms of dyspepsia or acid reflux but reported that he was taking pantoprazole to coat his stomach after he experienced a GI bleed on aspirin.    Please see above for final recommendations.  Oralia Manis, Student-PharmD 10/20/2017 10:24 AM

## 2017-10-20 NOTE — Progress Notes (Signed)
[facility] Progress Note  Subjective: just back from [facility].  Patient had patent prior stents in the RCA and LCx but severe diffuse stenosis of the prox and mid LAD treated w/ PTA and stenting.   Vitals:   10/20/17 0808 10/20/17 1222 10/20/17 1615 10/20/17 1716  BP:  120/69 125/75   Pulse: 68 64 75   Resp:  15    Temp:  98 F (36.7 C) 98.2 F (36.8 C)   TempSrc:  Oral Oral   SpO2:  99% 96% 97%  Weight:      Height:        Inpatient medications: . [MAR Hold] carvedilol  3.125 mg Oral BID WC  . [MAR Hold] ciprofloxacin  500 mg Oral BID  . [MAR Hold] clopidogrel  75 mg Oral Daily  . [MAR Hold] dicyclomine  20 mg Oral BID PC  . [MAR Hold] enoxaparin (LOVENOX) injection  40 mg Subcutaneous Q24H  . [MAR Hold] metroNIDAZOLE  500 mg Oral TID  . [MAR Hold] omega-3 acid ethyl esters  1 g Oral Daily  . [MAR Hold] pantoprazole  40 mg Oral Daily  . [MAR Hold] polyethylene glycol  17 g Oral QHS  . [MAR Hold] saccharomyces boulardii  250 mg Oral Daily  . [MAR Hold] sacubitril-valsartan  1 tablet Oral BID  . sodium chloride flush  3 mL Intravenous Q12H  . [MAR Hold] vitamin B-12  5,000 mcg Oral Daily   . sodium chloride    . [START ON 10/21/2017] sodium chloride 10 mL/hr at 10/20/17 1046   sodium chloride, [MAR Hold] acetaminophen, fentaNYL, Heparin (Porcine) in NaCl, lidocaine (PF), [MAR Hold] LORazepam, midazolam, [MAR Hold]  morphine injection, [MAR Hold] nitroGLYCERIN, [MAR Hold] ondansetron (ZOFRAN) IV, Radial Cocktail/Verapamil only, sodium chloride flush, [MAR Hold] zolpidem  Exam: Constitutional: NAD, calm  Eyes: PERTLA, lids and conjunctivae normal ENMT: Mucous membranes are moist. Posterior pharynx clear of any exudate Neck: normal, supple, no masses, no thyromegaly Respiratory: clear to auscultation bilaterally, no wheezing Cor: S1 & S2 heard, regular rate and rhythm. No extremity edema.   Abdomen: No distension, no tenderness, soft. Bowel sounds normal.   Musculoskeletal: no clubbing / cyanosis Skin: no significant rashes, lesions, ulcers. Warm, dry, well-perfused. Neurologic: CN 2-12 grossly intact. Sensation intact. Strength 5/5 in all 4 limbs.  Psychiatric: Alert and oriented x 3. Calm, cooperative.    Presentation Summary: [name] is a 72 y.o. male with medical history significant for coronary artery disease with stents, ischemic cardiomyopathy with EF 35%, and chronic kidney disease stage III, now presenting to the emergency department for evaluation of chest pain.  The patient reports that he has been experiencing intermittent chest discomfort over the past 2 to 3 days, much worse today after moving boxes at home.  Pain is described as a pressure sensation, radiating from the central chest towards the left shoulder, worse with exertion today and improved with nitroglycerin.  Denies any recent leg swelling or tenderness.  Reports some recent abdominal tenderness similar to his prior episodes of diverticulitis, and he was started on Cipro and Flagyl on 10/16/2017.  Abdominal symptoms have improved and he denies fevers or chills.  Denies melena or hematochezia.  ED Course: Upon arrival to the ED, patient is found to be afebrile, bradycardic in the 50s, saturating well on room air, and with vitals otherwise stable.  EKG features a sinus rhythm with T wave abnormalities that are similar to prior.  Chest x-ray is negative for acute cardiopulmonary disease.  Chemistry panel is notable for a creatinine of 1.31, slightly higher than prior.  Troponin is normal.  Patient remains hemodynamically stable, denies chest pain at this time, and will be observed on the telemetry unit for ongoing evaluation and management.       Hospital Problems/ Course:  1. Chest pain; CAD  - Patient has hx of CAD with stents, refused a recommended stress test last month per notes, and now presenting with 2-3 days of intermittent chest pressure, worse today after moving  boxes and improved with NTG   - EKG does not appear significantly changed, CXR unremarkable, and initial troponin normal - He reports allergy to ASA and statin intolerance  - seen by cardiology this am, LVEF has decreased from 50% to 30% per recent echo. Patient has stent in all 3 territories.   - went to cath lab today, prior stents open in 3 vessels, new prox and mid LAD severe disease treated w/ overlapping stents.  Cont plavix indefinitiely since ASA intolerant per cards.   2. Chronic systolic CHF  - Appears compensated  - Continue Coreg and Entresto, follow daily wts   3. CKD stage III  - SCr is 1.31 on admission, similar to priors  - Renally-dose medications   - repeat bmet in am after contrast  4. Diverticulitis  - Patient has hx of diverticulitis, was experiencing recurrent symptoms, and started on Cipro and Flagyl on 8/23  - Sxs improved, no fever or leukocytosis, no rectal bleeding  - Continue Cipro and Flagyl     DVT prophylaxis: Lovenox Code Status: Full  Family Communication: none at bedside Consults called: cardiology Admission status: Observation     Kelly Splinter MD [facility] pgr [phone] 10/20/2017, 5:38 PM   Recent Labs  Lab 10/19/17 1940  NA 138  K 4.0  CL 108  CO2 23  GLUCOSE 107*  BUN 16  CREATININE 1.31*  CALCIUM 9.0   No results for input(s): AST, ALT, ALKPHOS, BILITOT, PROT, ALBUMIN in the last 168 hours. Recent Labs  Lab 10/19/17 1940  WBC 5.7  HGB 12.5*  HCT 37.6*  MCV 93.1  PLT 226   Iron/TIBC/Ferritin/ %Sat No results found for: IRON, TIBC, FERRITIN, IRONPCTSAT

## 2017-10-20 NOTE — Plan of Care (Signed)

## 2017-10-20 NOTE — Care Management Obs Status (Signed)
[place] NOTIFICATION   Patient Details  Name: Keyante Durio MRN: [id number] Date of Birth: 08/05/1945   Medicare Observation Status Notification Given:  Yes    Bethena Roys, RN 10/20/2017, 4:08 PM

## 2017-10-20 NOTE — Interval H&P Note (Signed)
History and Physical Interval Note:  10/20/2017 5:31 PM  [name]  has presented today for surgery, with the diagnosis of ua  The various methods of treatment have been discussed with the patient and family. After consideration of risks, benefits and other options for treatment, the patient has consented to  Procedure(s): LEFT HEART CATH AND CORONARY ANGIOGRAPHY (N/A) as a surgical intervention .  The patient's history has been reviewed, patient examined, no change in status, stable for surgery.  I have reviewed the patient's chart and labs.  Questions were answered to the patient's satisfaction.     Sherren Mocha

## 2017-10-20 NOTE — Consult Note (Addendum)
The patient has been seen in conjunction with Vin Bhagat, PAC. All aspects of care have been considered and discussed. The patient has been personally interviewed, examined, and all clinical data has been reviewed.   Recent significant drop in LVEF from 50% range to 30% per recent echo.  Episodes of chest discomfort yesterday responsive to nitroglycerin raising question of progression of coronary disease.  Patient has stents in all 3 territories  Plan is to proceed with diagnostic coronary angiography and possible PCI later today to define anatomy and help guide therapy.  No enzyme elevation to suggest infarction however symptoms and decrease in LV function are concerning for silent occlusion of previously stented vessels.     Cardiology Consultation:   Patient ID: [name]; [id number]; 02/10/1946   Admit date: 10/19/2017 Date of Consult: 10/20/2017  Primary Care Provider: Velna Hatchet, MD Primary Cardiologist: Dr. Johnsie Cancel    Patient Profile:   [name] is a 72 y.o. male with a hx of CAD, chronic systolic CHF, prior GI bleed, HTN, large apicoseptal aneurysm and HLD who is being seen today for the evaluation of chest pain at the request of Dr. Jonnie Finner.   Hx of CAD with anterior MI and stents to LAD, RCA and Circumflex done in [place]. Had repeat cath in 2016 with patent stents medical Rx. He had borderline low EF over the years. However had normal LVEF by cath in 2016.  Last echo 08/2017 showed EF of 35% >>ACE stopped and was to start entresto with plans for f/u MRI October 2019 to see if he should be considered for AICD.   Had GI bleed in [place] March 2019.  Had a large erosion in the duodenal bulb that was clipped. Plavix was resumed and placed on PPI Rx. Per Dr. Kyla Balzarine note this clips are MRI safe.   History of Present Illness:   [name] presented with 3 days history of waxing and waning chest discomfort.  His pain somewhat exacerbated by light lifting during  exercise.  Yesterday he is pain got worse while moving boxes at home.  He started to having left shoulder pain radiating to his substernal area.  Described as heaviness.  No associated shortness of breath, nausea or vomiting.  This is similar to prior MI but very much less intensity.  At that time he had a pain described as "elephant sitting".  He took sublingual nitroglycerin x1 and EMS called.  He received another nitroglycerin by EMS and eventually his pain resolved.  Patient started on Cipro and Flagyl 10/16/2017 for diverticulitis.  Denies fever, chills, diarrhea or constipation.  No orthopnea, PND, syncope, palpitation or melena.  Compliant with medication.  Chest pain-free since admit.  Troponin negative.  Serum creatinine 1.31.  WBC normal.  Checks x-ray without acute abnormality.  Past Medical History:  Diagnosis Date  . Anxiety   . Arthritis    "wrists" (07/27/2014)  . Coronary artery disease    a.  Pt with 4 stents over the course of 2003-2012;  b.  LHC 6/16:  LAD, RCA, LCx stents patent, OM1 50%, EF 50-55% >> med rx   . Elevated LFTs   . GERD (gastroesophageal reflux disease)   . Hypercholesterolemia   . Hypertension   . Ischemic cardiomyopathy    cMRI 03/2011:  EF 48% with dist ant, septal, apical and inf-apical dyskinesia, no apical clot, dist ant, apical and septal full thickness scar >> No ICD needed  //  Limited echo 7/19: Large apical septal aneurysm,  severe hypokinesis of the entire apex and mid to apical segments of the anterior wall, anterolateral wall and anterior septum-consistent with infarct and most of the LAD territory; EF 35   . Left ventricular aneurysm    Limited echo 7/19: Large apical septal aneurysm, severe hypokinesis of the entire apex and mid to apical segments of the anterior wall, anterolateral wall and anterior septum-consistent with infarct and most of the LAD territory; EF 35  . Myocardial infarction [facility]) 2003    Past Surgical History:  Procedure  Laterality Date  . CARDIAC CATHETERIZATION  06/11/2012   Nonobstructive CAD, patent stents, EF 50%, mild apical dyskinesis  . CARDIAC CATHETERIZATION N/A 07/28/2014   Procedure: Left Heart Cath and Coronary Angiography;  Surgeon: Troy Sine, MD;  Location: [facility];  Service: Cardiovascular;  Laterality: N/A;  . CORONARY ANGIOPLASTY WITH STENT PLACEMENT  2003-2012   total of 4 stents place  . COSMETIC SURGERY Left 1970's   "dog ripped of my ear"  . LEFT HEART CATHETERIZATION WITH CORONARY ANGIOGRAM N/A 06/11/2012   Procedure: LEFT HEART CATHETERIZATION WITH CORONARY ANGIOGRAM;  Surgeon: Peter M Martinique, MD;  Location: [facility] CATH LAB;  Service: Cardiovascular;  Laterality: N/A;     Inpatient Medications: Scheduled Meds: . carvedilol  3.125 mg Oral BID WC  . ciprofloxacin  500 mg Oral BID  . clopidogrel  75 mg Oral Daily  . dicyclomine  20 mg Oral BID PC  . enoxaparin (LOVENOX) injection  40 mg Subcutaneous Q24H  . metroNIDAZOLE  500 mg Oral TID  . omega-3 acid ethyl esters  1 g Oral Daily  . pantoprazole  40 mg Oral Daily  . polyethylene glycol  17 g Oral QHS  . saccharomyces boulardii  250 mg Oral Daily  . sacubitril-valsartan  1 tablet Oral BID  . vitamin B-12  5,000 mcg Oral Daily   Continuous Infusions:  PRN Meds: acetaminophen, LORazepam, morphine injection, nitroGLYCERIN, ondansetron (ZOFRAN) IV, zolpidem  Allergies:    Allergies  Allergen Reactions  . Asa [Aspirin] Other (See Comments)    Abdominal bleeding  . Penicillins Anaphylaxis    Has patient had a PCN reaction causing immediate rash, facial/tongue/throat swelling, SOB or lightheadedness with hypotension: Yes Has patient had a PCN reaction causing severe rash involving mucus membranes or skin necrosis: No Has patient had a PCN reaction that required hospitalization: No Has patient had a PCN reaction occurring within the last 10 years: No If all of the above answers are "NO", then may proceed with  Cephalosporin use.  . Statins Other (See Comments)    Stiff joints, muscle tightness, couldn't walk    Social History:   Social History   Socioeconomic History  . Marital status: Divorced    Spouse name: Not on file  . Number of children: Not on file  . Years of education: Not on file  . Highest education level: Not on file  Occupational History  . Not on file  Social Needs  . Financial resource strain: Not on file  . Food insecurity:    Worry: Not on file    Inability: Not on file  . Transportation needs:    Medical: Not on file    Non-medical: Not on file  Tobacco Use  . Smoking status: Former Smoker    Packs/day: 1.00    Years: 15.00    Pack years: 15.00    Types: Cigarettes  . Smokeless tobacco: Never Used  . Tobacco comment: "quit smoking cigarettes in  1979"  Substance and Sexual Activity  . Alcohol use: Yes    Alcohol/week: 2.0 standard drinks    Types: 2 Glasses of wine per week  . Drug use: No  . Sexual activity: Not Currently  Lifestyle  . Physical activity:    Days per week: Not on file    Minutes per session: Not on file  . Stress: Not on file  Relationships  . Social connections:    Talks on phone: Not on file    Gets together: Not on file    Attends religious service: Not on file    Active member of club or organization: Not on file    Attends meetings of clubs or organizations: Not on file    Relationship status: Not on file  . Intimate partner violence:    Fear of current or ex partner: Not on file    Emotionally abused: Not on file    Physically abused: Not on file    Forced sexual activity: Not on file  Other Topics Concern  . Not on file  Social History Narrative  . Not on file    Family History:   Family History  Problem Relation Age of Onset  . Coronary artery disease Mother   . Coronary artery disease Father      ROS:  Please see the history of present illness.  All other ROS reviewed and negative.     Physical Exam/Data:    Vitals:   10/20/17 0000 10/20/17 0439 10/20/17 0748 10/20/17 0808  BP: 137/72 112/61 120/68   Pulse: 65 (!) 54 (!) 56 68  Resp:      Temp: 98 F (36.7 C) 98.2 F (36.8 C) 98.3 F (36.8 C)   TempSrc: Oral Oral Oral   SpO2: 99% 97% 98%   Weight:      Height:        Intake/Output Summary (Last 24 hours) at 10/20/2017 0814 Last data filed at 10/20/2017 0600 Gross per 24 hour  Intake 60 ml  Output -  Net 60 ml   Filed Weights   10/19/17 1932  Weight: 83.9 kg   Body mass index is 28.98 kg/m.  General:  Well nourished, well developed, in no acute distress HEENT: normal Lymph: no adenopathy Neck: no JVD Endocrine:  No thryomegaly Vascular: No carotid bruits; FA pulses 2+ bilaterally without bruits  Cardiac:  normal S1, S2; RRR; no murmur  Lungs:  clear to auscultation bilaterally, no wheezing, rhonchi or rales  Abd: soft, nontender, no hepatomegaly  Ext: no edema Musculoskeletal:  No deformities, BUE and BLE strength normal and equal Skin: warm and dry  Neuro:  CNs 2-12 intact, no focal abnormalities noted Psych:  Normal affect   EKG:  The EKG was personally reviewed and demonstrates: Sinus rhythm at rate of 60 anterior lateral Q waves which appears chronic Telemetry:  Telemetry was personally reviewed and demonstrates: Sinus bradycardia at 50s Relevant CV Studies:  Left Heart Cath and Coronary Angiography  07/2014  Conclusion    Ost 1st Mrg to 1st Mrg lesion, 50% stenosed.  Ost LAD to Dist LAD lesion, 20% stenosed.   Low normal LV function with an ejection fraction of 50-55% with evidence for an akinetic/dyskinetic aneurysmal apex.  Coronary artery  disease with patent tandem proximal to mid LAD stents with smooth 20% in-stent narrowing in the distal aspect of the stent; patent  left circumflex stent with evidence for 50-60% proximal narrowing in a small marginal branch which arises with  aupper takeoff from this stented segment; and widely patent RCA stent in a  dominant RCA system.  RECOMMENDATION:  Medical therapy.   Diagnostic Diagram       Echo 09/01/17 Impressions:  - Limited Definity enhanced study for LV wall motion and thrombus.   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   There is severe hypokinesis in the entire apex and the mid-apical   segments of the anterior wall, anterolateral wall and anterior   septum.   This is consistent with infarction in most of the LAD artery   territory.   Overall LV ejection fraction is approximately 35%.   Laboratory Data:  Chemistry Recent Labs  Lab 10/19/17 1940  NA 138  K 4.0  CL 108  CO2 23  GLUCOSE 107*  BUN 16  CREATININE 1.31*  CALCIUM 9.0  GFRNONAA 53*  GFRAA >60  ANIONGAP 7   Hematology Recent Labs  Lab 10/19/17 1940  WBC 5.7  RBC 4.04*  HGB 12.5*  HCT 37.6*  MCV 93.1  MCH 30.9  MCHC 33.2  RDW 13.5  PLT 226   Cardiac Enzymes Recent Labs  Lab 10/19/17 2159 10/20/17 0317  TROPONINI <0.03 <0.03    Recent Labs  Lab 10/19/17 1947  TROPIPOC 0.03     Radiology/Studies:  Dg Chest 2 View  Result Date: 10/19/2017 CLINICAL DATA:  72 year old male with chest pain. EXAM: CHEST - 2 VIEW COMPARISON:  Chest radiograph dated 11/09/2015 FINDINGS: The lungs are clear. There is no pleural effusion or pneumothorax. The cardiac silhouette is within normal limits. Coronary vascular calcifications noted. No acute osseous pathology. IMPRESSION: 1. No acute cardiopulmonary process. 2. Coronary vascular calcification. Electronically Signed   By: Anner Crete M.D.   On: 10/19/2017 21:06    Assessment and Plan:   1. Chest pain -Mixed symptoms.  Similar to prior MI but very less intensity.  Resolved with sublingual nitroglycerin x2.  Currently on antibiotic for diverticulitis.  EKG without acute ischemic changes.  Troponin has been negative.  Keep n.p.o.   LVEF was normal by cath in 2016 however echo 08/2017 showed LVEF of 35%. ? Due to ICM. Will review plan with Dr.  Tamala Julian.  2.  CAD status post stenting to RCA, circumflex and LAD -Last cath in 07/2014 showed patent stent.  No evaluation since then. -Continue Plavix and beta-blocker (unable to up-titrate due to baseline bradycardia).  3.  Chronic systolic heart failure -Euvolemic.  Denies orthopnea.  Continue beta-blocker and Entresto.  Plan for MRI October 2019 to evaluate LV function.  4.  Hyperlipidemia - LDL 127 07/2017.  Statin intolerance.  Previously declined lipid clinic referral.  5.  Hypertension -Blood pressure stable  For questions or updates, please contact [facility] Please consult [email or web address] for contact info under Cardiology/STEMI.   Jarrett Soho, [place]  10/20/2017 8:14 AM

## 2017-10-20 NOTE — H&P (View-Only) (Signed)
The patient has been seen in conjunction with [name], PAC. All aspects of care have been considered and discussed. The patient has been personally interviewed, examined, and all clinical data has been reviewed.   Recent significant drop in LVEF from 50% range to 30% per recent echo.  Episodes of chest discomfort yesterday responsive to nitroglycerin raising question of progression of coronary disease.  Patient has stents in all 3 territories  Plan is to proceed with diagnostic coronary angiography and possible PCI later today to define anatomy and help guide therapy.  No enzyme elevation to suggest infarction however symptoms and decrease in LV function are concerning for silent occlusion of previously stented vessels.     Cardiology Consultation:   Patient ID: [name]; [id number]; 27-Oct-1945   Admit date: 10/19/2017 Date of Consult: 10/20/2017  Primary Care Provider: Velna Hatchet, MD Primary Cardiologist: Dr. Johnsie Cancel    Patient Profile:   [name] is a 72 y.o. male with a hx of CAD, chronic systolic CHF, prior GI bleed, HTN, large apicoseptal aneurysm and HLD who is being seen today for the evaluation of chest pain at the request of Dr. Jonnie [name].   Hx of CAD with anterior MI and stents to LAD, RCA and Circumflex done in [place]. Had repeat cath in 2016 with patent stents medical Rx. He had borderline low EF over the years. However had normal LVEF by cath in 2016.  Last echo 08/2017 showed EF of 35% >>ACE stopped and was to start entresto with plans for f/u MRI October 2019 to see if he should be considered for AICD.   Had GI bleed in [place] March 2019.  Had a large erosion in the duodenal bulb that was clipped. Plavix was resumed and placed on PPI Rx. Per Dr. Kyla [name] note this clips are MRI safe.   History of Present Illness:   [name] presented with 3 days history of waxing and waning chest discomfort.  His pain somewhat exacerbated by light lifting during  exercise.  Yesterday he is pain got worse while moving boxes at home.  He started to having left shoulder pain radiating to his substernal area.  Described as heaviness.  No associated shortness of breath, nausea or vomiting.  This is similar to prior MI but very much less intensity.  At that time he had a pain described as "elephant sitting".  He took sublingual nitroglycerin x1 and EMS called.  He received another nitroglycerin by EMS and eventually his pain resolved.  Patient started on Cipro and Flagyl 10/16/2017 for diverticulitis.  Denies fever, chills, diarrhea or constipation.  No orthopnea, PND, syncope, palpitation or melena.  Compliant with medication.  Chest pain-free since admit.  Troponin negative.  Serum creatinine 1.31.  WBC normal.  Checks x-ray without acute abnormality.  Past Medical History:  Diagnosis Date  . Anxiety   . Arthritis    "wrists" (07/27/2014)  . Coronary artery disease    a.  Pt with 4 stents over the course of 2003-2012;  b.  LHC 6/16:  LAD, RCA, LCx stents patent, OM1 50%, EF 50-55% >> med rx   . Elevated LFTs   . GERD (gastroesophageal reflux disease)   . Hypercholesterolemia   . Hypertension   . Ischemic cardiomyopathy    cMRI 03/2011:  EF 48% with dist ant, septal, apical and inf-apical dyskinesia, no apical clot, dist ant, apical and septal full thickness scar >> No ICD needed  //  Limited echo 7/19: Large apical septal aneurysm,  severe hypokinesis of the entire apex and mid to apical segments of the anterior wall, anterolateral wall and anterior septum-consistent with infarct and most of the LAD territory; EF 35   . Left ventricular aneurysm    Limited echo 7/19: Large apical septal aneurysm, severe hypokinesis of the entire apex and mid to apical segments of the anterior wall, anterolateral wall and anterior septum-consistent with infarct and most of the LAD territory; EF 35  . Myocardial infarction [facility]) 2003    Past Surgical History:  Procedure  Laterality Date  . CARDIAC CATHETERIZATION  06/11/2012   Nonobstructive CAD, patent stents, EF 50%, mild apical dyskinesis  . CARDIAC CATHETERIZATION N/A 07/28/2014   Procedure: Left Heart Cath and Coronary Angiography;  Surgeon: [name] Sine, MD;  Location: [place];  Service: Cardiovascular;  Laterality: N/A;  . CORONARY ANGIOPLASTY WITH STENT PLACEMENT  2003-2012   total of 4 stents place  . COSMETIC SURGERY Left 1970's   "dog ripped of my ear"  . LEFT HEART CATHETERIZATION WITH CORONARY ANGIOGRAM N/A 06/11/2012   Procedure: LEFT HEART CATHETERIZATION WITH CORONARY ANGIOGRAM;  Surgeon: [name] M Martinique, MD;  Location: [facility] CATH [facility];  Service: Cardiovascular;  Laterality: N/A;     Inpatient Medications: Scheduled Meds: . carvedilol  3.125 mg Oral BID WC  . ciprofloxacin  500 mg Oral BID  . clopidogrel  75 mg Oral Daily  . dicyclomine  20 mg Oral BID PC  . enoxaparin (LOVENOX) injection  40 mg Subcutaneous Q24H  . metroNIDAZOLE  500 mg Oral TID  . omega-3 acid ethyl esters  1 g Oral Daily  . pantoprazole  40 mg Oral Daily  . polyethylene glycol  17 g Oral QHS  . saccharomyces boulardii  250 mg Oral Daily  . sacubitril-valsartan  1 tablet Oral BID  . vitamin B-12  5,000 mcg Oral Daily   Continuous Infusions:  PRN Meds: acetaminophen, LORazepam, morphine injection, nitroGLYCERIN, ondansetron (ZOFRAN) IV, zolpidem  Allergies:    Allergies  Allergen Reactions  . Asa [Aspirin] Other (See Comments)    Abdominal bleeding  . Penicillins Anaphylaxis    Has patient had a PCN reaction causing immediate rash, facial/tongue/throat swelling, SOB or lightheadedness with hypotension: Yes Has patient had a PCN reaction causing severe rash involving mucus membranes or skin necrosis: No Has patient had a PCN reaction that required hospitalization: No Has patient had a PCN reaction occurring within the last 10 years: No If all of the above answers are "NO", then may proceed with  Cephalosporin use.  . Statins Other (See Comments)    Stiff joints, muscle tightness, couldn't walk    Social History:   Social History   Socioeconomic History  . Marital status: Divorced    Spouse name: Not on file  . Number of children: Not on file  . Years of education: Not on file  . Highest education level: Not on file  Occupational History  . Not on file  Social Needs  . Financial resource strain: Not on file  . Food insecurity:    Worry: Not on file    Inability: Not on file  . Transportation needs:    Medical: Not on file    Non-medical: Not on file  Tobacco Use  . Smoking status: Former Smoker    Packs/day: 1.00    Years: 15.00    Pack years: 15.00    Types: Cigarettes  . Smokeless tobacco: Never Used  . Tobacco comment: "quit smoking cigarettes in  1979"  Substance and Sexual Activity  . Alcohol use: Yes    Alcohol/week: 2.0 standard drinks    Types: 2 Glasses of wine per week  . Drug use: No  . Sexual activity: Not Currently  Lifestyle  . Physical activity:    Days per week: Not on file    Minutes per session: Not on file  . Stress: Not on file  Relationships  . Social connections:    Talks on phone: Not on file    Gets together: Not on file    Attends religious service: Not on file    Active member of club or organization: Not on file    Attends meetings of clubs or organizations: Not on file    Relationship status: Not on file  . Intimate partner violence:    Fear of current or ex partner: Not on file    Emotionally abused: Not on file    Physically abused: Not on file    Forced sexual activity: Not on file  Other Topics Concern  . Not on file  Social History Narrative  . Not on file    Family History:   Family History  Problem Relation Age of Onset  . Coronary artery disease Mother   . Coronary artery disease Father      ROS:  Please see the history of present illness.  All other ROS reviewed and negative.     Physical Exam/Data:    Vitals:   10/20/17 0000 10/20/17 0439 10/20/17 0748 10/20/17 0808  BP: 137/72 112/61 120/68   Pulse: 65 (!) 54 (!) 56 68  Resp:      Temp: 98 F (36.7 C) 98.2 F (36.8 C) 98.3 F (36.8 C)   TempSrc: Oral Oral Oral   SpO2: 99% 97% 98%   Weight:      Height:        Intake/Output Summary (Last 24 hours) at 10/20/2017 0814 Last data filed at 10/20/2017 0600 Gross per 24 hour  Intake 60 ml  Output -  Net 60 ml   Filed Weights   10/19/17 1932  Weight: 83.9 kg   Body mass index is 28.98 kg/m.  General:  Well nourished, well developed, in no acute distress HEENT: normal Lymph: no adenopathy Neck: no JVD Endocrine:  No thryomegaly Vascular: No carotid bruits; FA pulses 2+ bilaterally without bruits  Cardiac:  normal S1, S2; RRR; no murmur  Lungs:  clear to auscultation bilaterally, no wheezing, rhonchi or rales  Abd: soft, nontender, no hepatomegaly  Ext: no edema Musculoskeletal:  No deformities, BUE and BLE strength normal and equal Skin: warm and dry  Neuro:  CNs 2-12 intact, no focal abnormalities noted Psych:  Normal affect   EKG:  The EKG was personally reviewed and demonstrates: Sinus rhythm at rate of 60 anterior lateral Q waves which appears chronic Telemetry:  Telemetry was personally reviewed and demonstrates: Sinus bradycardia at 50s Relevant CV Studies:  Left Heart Cath and Coronary Angiography  07/2014  Conclusion    Ost 1st Mrg to 1st Mrg lesion, 50% stenosed.  Ost LAD to Dist LAD lesion, 20% stenosed.   Low normal LV function with an ejection fraction of 50-55% with evidence for an akinetic/dyskinetic aneurysmal apex.  Coronary artery  disease with patent tandem proximal to mid LAD stents with smooth 20% in-stent narrowing in the distal aspect of the stent; patent  left circumflex stent with evidence for 50-60% proximal narrowing in a small marginal branch which arises with  aupper takeoff from this stented segment; and widely patent RCA stent in a  dominant RCA system.  RECOMMENDATION:  Medical therapy.   Diagnostic Diagram       Echo 09/01/17 Impressions:  - Limited Definity enhanced study for LV wall motion and thrombus.   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   There is severe hypokinesis in the entire apex and the mid-apical   segments of the anterior wall, anterolateral wall and anterior   septum.   This is consistent with infarction in most of the LAD artery   territory.   Overall LV ejection fraction is approximately 35%.   Laboratory Data:  Chemistry Recent Labs  Lab 10/19/17 1940  NA 138  K 4.0  CL 108  CO2 23  GLUCOSE 107*  BUN 16  CREATININE 1.31*  CALCIUM 9.0  GFRNONAA 53*  GFRAA >60  ANIONGAP 7   Hematology Recent Labs  Lab 10/19/17 1940  WBC 5.7  RBC 4.04*  HGB 12.5*  HCT 37.6*  MCV 93.1  MCH 30.9  MCHC 33.2  RDW 13.5  PLT 226   Cardiac Enzymes Recent Labs  Lab 10/19/17 2159 10/20/17 0317  TROPONINI <0.03 <0.03    Recent Labs  Lab 10/19/17 1947  TROPIPOC 0.03     Radiology/Studies:  Dg Chest 2 View  Result Date: 10/19/2017 CLINICAL DATA:  72 year old male with chest pain. EXAM: CHEST - 2 VIEW COMPARISON:  Chest radiograph dated 11/09/2015 FINDINGS: The lungs are clear. There is no pleural effusion or pneumothorax. The cardiac silhouette is within normal limits. Coronary vascular calcifications noted. No acute osseous pathology. IMPRESSION: 1. No acute cardiopulmonary process. 2. Coronary vascular calcification. Electronically Signed   By: Anner Crete M.D.   On: 10/19/2017 21:06    Assessment and Plan:   1. Chest pain -Mixed symptoms.  Similar to prior MI but very less intensity.  Resolved with sublingual nitroglycerin x2.  Currently on antibiotic for diverticulitis.  EKG without acute ischemic changes.  Troponin has been negative.  Keep n.p.o.   LVEF was normal by cath in 2016 however echo 08/2017 showed LVEF of 35%. ? Due to ICM. Will review plan with Dr.  Tamala Julian.  2.  CAD status post stenting to RCA, circumflex and LAD -Last cath in 07/2014 showed patent stent.  No evaluation since then. -Continue Plavix and beta-blocker (unable to up-titrate due to baseline bradycardia).  3.  Chronic systolic heart failure -Euvolemic.  Denies orthopnea.  Continue beta-blocker and Entresto.  Plan for MRI October 2019 to evaluate LV function.  4.  Hyperlipidemia - LDL 127 07/2017.  Statin intolerance.  Previously declined lipid clinic referral.  5.  Hypertension -Blood pressure stable  For questions or updates, please contact [place] Please consult [email or web address] for contact info under Cardiology/STEMI.   Jarrett Soho, [place]  10/20/2017 8:14 AM

## 2017-10-21 ENCOUNTER — Encounter (HOSPITAL_COMMUNITY): Payer: Self-pay | Admitting: Cardiovascular Disease

## 2017-10-21 DIAGNOSIS — I200 Unstable angina: Secondary | ICD-10-CM | POA: Diagnosis not present

## 2017-10-21 DIAGNOSIS — I5022 Chronic systolic (congestive) heart failure: Secondary | ICD-10-CM | POA: Diagnosis present

## 2017-10-21 DIAGNOSIS — I2510 Atherosclerotic heart disease of native coronary artery without angina pectoris: Secondary | ICD-10-CM | POA: Diagnosis not present

## 2017-10-21 DIAGNOSIS — Z87891 Personal history of nicotine dependence: Secondary | ICD-10-CM | POA: Diagnosis not present

## 2017-10-21 DIAGNOSIS — Z8249 Family history of ischemic heart disease and other diseases of the circulatory system: Secondary | ICD-10-CM | POA: Diagnosis not present

## 2017-10-21 DIAGNOSIS — I10 Essential (primary) hypertension: Secondary | ICD-10-CM | POA: Diagnosis not present

## 2017-10-21 DIAGNOSIS — Z23 Encounter for immunization: Secondary | ICD-10-CM | POA: Diagnosis present

## 2017-10-21 DIAGNOSIS — I130 Hypertensive heart and chronic kidney disease with heart failure and stage 1 through stage 4 chronic kidney disease, or unspecified chronic kidney disease: Secondary | ICD-10-CM | POA: Diagnosis present

## 2017-10-21 DIAGNOSIS — K5792 Diverticulitis of intestine, part unspecified, without perforation or abscess without bleeding: Secondary | ICD-10-CM | POA: Diagnosis present

## 2017-10-21 DIAGNOSIS — Z955 Presence of coronary angioplasty implant and graft: Secondary | ICD-10-CM | POA: Diagnosis not present

## 2017-10-21 DIAGNOSIS — Z886 Allergy status to analgesic agent status: Secondary | ICD-10-CM | POA: Diagnosis not present

## 2017-10-21 DIAGNOSIS — I255 Ischemic cardiomyopathy: Secondary | ICD-10-CM | POA: Diagnosis present

## 2017-10-21 DIAGNOSIS — K219 Gastro-esophageal reflux disease without esophagitis: Secondary | ICD-10-CM | POA: Diagnosis present

## 2017-10-21 DIAGNOSIS — Z9861 Coronary angioplasty status: Secondary | ICD-10-CM | POA: Diagnosis not present

## 2017-10-21 DIAGNOSIS — N183 Chronic kidney disease, stage 3 (moderate): Secondary | ICD-10-CM | POA: Diagnosis present

## 2017-10-21 DIAGNOSIS — I25119 Atherosclerotic heart disease of native coronary artery with unspecified angina pectoris: Secondary | ICD-10-CM | POA: Diagnosis present

## 2017-10-21 DIAGNOSIS — E785 Hyperlipidemia, unspecified: Secondary | ICD-10-CM | POA: Diagnosis present

## 2017-10-21 DIAGNOSIS — E7800 Pure hypercholesterolemia, unspecified: Secondary | ICD-10-CM | POA: Diagnosis present

## 2017-10-21 DIAGNOSIS — Z7902 Long term (current) use of antithrombotics/antiplatelets: Secondary | ICD-10-CM | POA: Diagnosis not present

## 2017-10-21 DIAGNOSIS — I252 Old myocardial infarction: Secondary | ICD-10-CM | POA: Diagnosis not present

## 2017-10-21 DIAGNOSIS — R079 Chest pain, unspecified: Secondary | ICD-10-CM | POA: Diagnosis not present

## 2017-10-21 LAB — BASIC METABOLIC PANEL
ANION GAP: 9 (ref 5–15)
BUN: 12 mg/dL (ref 8–23)
CALCIUM: 9.3 mg/dL (ref 8.9–10.3)
CO2: 23 mmol/L (ref 22–32)
Chloride: 106 mmol/L (ref 98–111)
Creatinine, Ser: 1.15 mg/dL (ref 0.61–1.24)
GFR calc Af Amer: >60 mL/min (ref >60)
Glucose, Bld: 111 mg/dL — ABNORMAL HIGH (ref 70–99)
POTASSIUM: 4 mmol/L (ref 3.5–5.1)
Sodium: 138 mmol/L (ref 135–145)

## 2017-10-21 LAB — CBC
HEMATOCRIT: 42.8 % (ref 39.0–52.0)
HEMOGLOBIN: 14.2 g/dL (ref 13.0–17.0)
MCH: 30.9 pg (ref 26.0–34.0)
MCHC: 33.2 g/dL (ref 30.0–36.0)
MCV: 93 fL (ref 78.0–100.0)
Platelets: 228 10*3/uL (ref 150–400)
RBC: 4.6 MIL/uL (ref 4.22–5.81)
RDW: 13.7 % (ref 11.5–15.5)
WBC: 6.3 10*3/uL (ref 4.0–10.5)

## 2017-10-21 NOTE — Discharge Summary (Signed)
Physician Discharge Summary  [name] XBD:[id number] DOB: 10/17/1945 DOA: 10/19/2017  PCP: [name] Hatchet, MD  Admit date: 10/19/2017 Discharge date: 10/21/2017  Time spent: 45 minutes  Recommendations for Outpatient Follow-up:  -Patient will be discharged home today. -Cardiology will arrange follow-up with [name] in 2 to 4 weeks. -Due to aspirin allergy patient will remain on Plavix indefinitely.  Discharge Diagnoses:  Principal Problem:   Chest pain Active Problems:   CAD S/P percutaneous coronary angioplasty   Hypertension   Ischemic cardiomyopathy   CKD (chronic kidney disease), stage III ([place])   Diverticulitis   Discharge Condition: Stable and improved  Filed Weights   10/19/17 1932 10/20/17 1909 10/21/17 0546  Weight: 83.9 kg 83.9 kg 82.3 kg    History of present illness:  As per Dr. Myna [name] on 8/26: [name] is a 72 y.o. male with medical history significant for coronary artery disease with stents, ischemic cardiomyopathy with EF 35%, and chronic kidney disease stage III, now presenting to the emergency department for evaluation of chest pain.  The patient reports that he has been experiencing intermittent chest discomfort over the past 2 to 3 days, much worse today after moving boxes at home.  Pain is described as a pressure sensation, radiating from the central chest towards the left shoulder, worse with exertion today and improved with nitroglycerin.  Denies any recent leg swelling or tenderness.  Reports some recent abdominal tenderness similar to his prior episodes of diverticulitis, and he was started on Cipro and Flagyl on 10/16/2017.  Abdominal symptoms have improved and he denies fevers or chills.  Denies melena or hematochezia.  ED Course: Upon arrival to the ED, patient is found to be afebrile, bradycardic in the 50s, saturating well on room air, and with vitals otherwise stable.  EKG features a sinus rhythm with T wave abnormalities that are  similar to prior.  Chest x-ray is negative for acute cardiopulmonary disease.  Chemistry panel is notable for a creatinine of 1.31, slightly higher than prior.  Troponin is normal.  Patient remains hemodynamically stable, denies chest pain at this time, and will be observed on the telemetry unit for ongoing evaluation and management.  Hospital Course:   Coronary artery disease with unstable angina -Was treated with the mid LAD stent on 8/27 which has been successful. -Under normal circumstances would recommend dual antiplatelet therapy, however given his history of aspirin allergy, will be on Plavix indefinitely. -Continue Coreg, Entresto,  Hyperlipidemia -On Lovaza  Stage III chronic kidney disease -Is stable despite IV contrast load.  Diverticulitis -Patient was started on Cipro and Flagyl as an outpatient due to symptoms of diverticulitis -Symptoms have improved, denies abdominal pain, no rectal bleeding, fever or leukocytosis. -Continue Cipro and Flagyl for planned course of treatment.  Procedures:  Cardiac catheterization with mid LAD stent on 8/27  Consultations:  Cardiology  Discharge Instructions  Discharge Instructions    Amb Referral to Cardiac Rehabilitation   Complete by:  As directed    Diagnosis:  Coronary Stents   Diet - low sodium heart healthy   Complete by:  As directed    Increase activity slowly   Complete by:  As directed      Allergies as of 10/21/2017      Reactions   Asa [aspirin] Other (See Comments)   Abdominal bleeding   Penicillins Anaphylaxis   Has patient had a PCN reaction causing immediate rash, facial/tongue/throat swelling, SOB or lightheadedness with hypotension: Yes Has patient had a  PCN reaction causing severe rash involving mucus membranes or skin necrosis: No Has patient had a PCN reaction that required hospitalization: No Has patient had a PCN reaction occurring within the last 10 years: No If all of the above answers are "NO",  then may proceed with Cephalosporin use.   Statins Other (See Comments)   Stiff joints, muscle tightness, couldn't walk      Medication List    TAKE these medications   ALIGN 4 MG Caps Take 4 mg by mouth daily.   carvedilol 3.125 MG tablet Commonly known as:  COREG Take 1 tablet (3.125 mg total) by mouth 2 (two) times daily with a meal.   ciprofloxacin 500 MG tablet Commonly known as:  CIPRO Take 500 mg by mouth 2 (two) times daily. 7 day course started 10/16/17 pm   clopidogrel 75 MG tablet Commonly known as:  PLAVIX Take 1 tablet (75 mg total) by mouth daily.   dicyclomine 20 MG tablet Commonly known as:  BENTYL Take 20 mg by mouth 2 (two) times daily after a meal.   LORazepam 0.5 MG tablet Commonly known as:  ATIVAN Take 1 tablet (0.5 mg total) by mouth every 8 (eight) hours as needed. For anxiety What changed:    reasons to take this  additional instructions   metroNIDAZOLE 500 MG tablet Commonly known as:  FLAGYL Take 500 mg by mouth 3 (three) times daily. 7 day course filled 10/16/17   nitroGLYCERIN 0.4 MG SL tablet Commonly known as:  NITROSTAT Place 0.4 mg under the tongue every 5 (five) minutes as needed for chest pain. For a max of 3 doses. If no relief, call 911.   omega-3 acid ethyl esters 1 g capsule Commonly known as:  LOVAZA Take 1 g by mouth daily.   OVER THE COUNTER MEDICATION Take 1 capsule by mouth 3 (three) times daily. Herbal supplement from Dr. Ardeth [name]  - to strengthen intestinal lining   pantoprazole 40 MG tablet Commonly known as:  PROTONIX Take 1 tablet (40 mg total) by mouth daily.   polyethylene glycol packet Commonly known as:  MIRALAX / GLYCOLAX Take 17 g by mouth at bedtime. Mix in 8 oz water and drink   sacubitril-valsartan 97-103 MG Commonly known as:  ENTRESTO Take 1 tablet by mouth 2 (two) times daily.   Vitamin B-12 5000 MCG Lozg Take 5,000 Units by mouth daily.   vitamin C 500 MG tablet Commonly known as:  ASCORBIC  ACID Take 500 mg by mouth daily.   zolpidem 10 MG tablet Commonly known as:  AMBIEN Take 1 tablet (10 mg total) by mouth at bedtime as needed. For sleep What changed:    how much to take  reasons to take this  additional instructions      Allergies  Allergen Reactions  . Asa [Aspirin] Other (See Comments)    Abdominal bleeding  . Penicillins Anaphylaxis    Has patient had a PCN reaction causing immediate rash, facial/tongue/throat swelling, SOB or lightheadedness with hypotension: Yes Has patient had a PCN reaction causing severe rash involving mucus membranes or skin necrosis: No Has patient had a PCN reaction that required hospitalization: No Has patient had a PCN reaction occurring within the last 10 years: No If all of the above answers are "NO", then may proceed with Cephalosporin use.  . Statins Other (See Comments)    Stiff joints, muscle tightness, couldn't walk      The results of significant diagnostics from this hospitalization (including imaging,  microbiology, ancillary and laboratory) are listed below for reference.    Significant Diagnostic Studies: Dg Chest 2 View  Result Date: 10/19/2017 CLINICAL DATA:  72 year old male with chest pain. EXAM: CHEST - 2 VIEW COMPARISON:  Chest radiograph dated 11/09/2015 FINDINGS: The lungs are clear. There is no pleural effusion or pneumothorax. The cardiac silhouette is within normal limits. Coronary vascular calcifications noted. No acute osseous pathology. IMPRESSION: 1. No acute cardiopulmonary process. 2. Coronary vascular calcification. Electronically Signed   By: Anner Crete M.D.   On: 10/19/2017 21:06    Microbiology: No results found for this or any previous visit (from the past 240 hour(s)).   Labs: Basic Metabolic Panel: Recent Labs  Lab 10/19/17 1940 10/21/17 0207  NA 138 138  K 4.0 4.0  CL 108 106  CO2 23 23  GLUCOSE 107* 111*  BUN 16 12  CREATININE 1.31* 1.15  CALCIUM 9.0 9.3   Liver  Function Tests: No results for input(s): AST, ALT, ALKPHOS, BILITOT, PROT, ALBUMIN in the last 168 hours. No results for input(s): LIPASE, AMYLASE in the last 168 hours. No results for input(s): AMMONIA in the last 168 hours. CBC: Recent Labs  Lab 10/19/17 1940 10/21/17 0207  WBC 5.7 6.3  HGB 12.5* 14.2  HCT 37.6* 42.8  MCV 93.1 93.0  PLT 226 228   Cardiac Enzymes: Recent Labs  Lab 10/19/17 2159 10/20/17 0317 10/20/17 1021  TROPONINI <0.03 <0.03 <0.03   BNP: BNP (last 3 results) No results for input(s): BNP in the last 8760 hours.  ProBNP (last 3 results) No results for input(s): PROBNP in the last 8760 hours.  CBG: No results for input(s): GLUCAP in the last 168 hours.     Signed:  Lelon Frohlich  [facility] Pager: [phone] 10/21/2017, 1:38 PM

## 2017-10-21 NOTE — Progress Notes (Signed)
CARDIAC REHAB PHASE I   PRE:  Rate/Rhythm: 49 SR with PVC  BP:  Sitting: 144/71      SaO2: 97 RA  MODE:  Ambulation: 600 ft   POST:  Rate/Rhythm: 59 SB  BP:  Sitting: 150/66    SaO2: 96 RA   Pt ambulated 635ft in hallway independently without any difficulty. Pt denies CP or SOB. Educated pt on importance of Plavix and NTG. Pt allergic to ASA and statins. Stent card not at bedside, RN made aware. Pt given CHF booklet and reviewed with pt. Pt given heart healthy and low sodium diets. Reviewed restrictions and exercise guidelines. Will refer to CRP II [place].  [id number] Rufina Falco, RN BSN 10/21/2017 9:56 AM

## 2017-10-21 NOTE — Progress Notes (Signed)
Progress Note  Patient Name: [name] Date of Encounter: 10/21/2017  Primary Cardiologist: Jenkins Rouge  Subjective   Recurrence of chest pain .  No access site complications.  Inpatient Medications    Scheduled Meds: . carvedilol  3.125 mg Oral BID WC  . ciprofloxacin  500 mg Oral BID  . clopidogrel  75 mg Oral Daily  . dicyclomine  20 mg Oral BID PC  . enoxaparin (LOVENOX) injection  40 mg Subcutaneous Q24H  . metroNIDAZOLE  500 mg Oral TID  . omega-3 acid ethyl esters  1 g Oral Daily  . pantoprazole  40 mg Oral Daily  . pneumococcal 23 valent vaccine  0.5 mL Intramuscular Tomorrow-1000  . polyethylene glycol  17 g Oral QHS  . saccharomyces boulardii  250 mg Oral Daily  . sacubitril-valsartan  1 tablet Oral BID  . sodium chloride flush  3 mL Intravenous Q12H  . vitamin B-12  5,000 mcg Oral Daily   Continuous Infusions: . sodium chloride     PRN Meds: sodium chloride, acetaminophen, LORazepam, morphine injection, nitroGLYCERIN, ondansetron (ZOFRAN) IV, sodium chloride flush, zolpidem   Vital Signs    Vitals:   10/20/17 1909 10/21/17 0546 10/21/17 0600 10/21/17 0714  BP: 135/65  (!) 143/57 139/73  Pulse: 76  (!) 48 (!) 46  Resp: 18  13 12   Temp: 97.8 F (36.6 C)  97.7 F (36.5 C) 98.1 F (36.7 C)  TempSrc: Oral  Oral Oral  SpO2: 100%  99% 98%  Weight: 83.9 kg 82.3 kg    Height: 5\' 7"  (1.702 m)       Intake/Output Summary (Last 24 hours) at 10/21/2017 0920 Last data filed at 10/21/2017 0546 Gross per 24 hour  Intake 1472.62 ml  Output 1550 ml  Net -77.38 ml   Filed Weights   10/19/17 1932 10/20/17 1909 10/21/17 0546  Weight: 83.9 kg 83.9 kg 82.3 kg    Telemetry    Normal sinus rhythm- Personally Reviewed  ECG    Sinus bradycardia 48 bpm without ST-T wave abnormality- Personally Reviewed  Physical Exam  Appears younger than stated GEN: No acute distress.   Neck: No JVD Cardiac: RRR, no murmurs, rubs, or gallops.  Respiratory: Clear to  auscultation bilaterally. GI: Soft, nontender, non-distended  MS:  Right radial access site is unremarkable. Neuro:  Nonfocal  Psych: Normal affect   Labs    Chemistry Recent Labs  Lab 10/19/17 1940 10/21/17 0207  NA 138 138  K 4.0 4.0  CL 108 106  CO2 23 23  GLUCOSE 107* 111*  BUN 16 12  CREATININE 1.31* 1.15  CALCIUM 9.0 9.3  GFRNONAA 53* >60  GFRAA >60 >60  ANIONGAP 7 9     Hematology Recent Labs  Lab 10/19/17 1940 10/21/17 0207  WBC 5.7 6.3  RBC 4.04* 4.60  HGB 12.5* 14.2  HCT 37.6* 42.8  MCV 93.1 93.0  MCH 30.9 30.9  MCHC 33.2 33.2  RDW 13.5 13.7  PLT 226 228    Cardiac Enzymes Recent Labs  Lab 10/19/17 2159 10/20/17 0317 10/20/17 1021  TROPONINI <0.03 <0.03 <0.03    Recent Labs  Lab 10/19/17 1947  TROPIPOC 0.03     BNPNo results for input(s): BNP, PROBNP in the last 168 hours.   DDimer No results for input(s): DDIMER in the last 168 hours.   Radiology    Dg Chest 2 View  Result Date: 10/19/2017 CLINICAL DATA:  72 year old male with chest pain. EXAM: CHEST - 2  VIEW COMPARISON:  Chest radiograph dated 11/09/2015 FINDINGS: The lungs are clear. There is no pleural effusion or pneumothorax. The cardiac silhouette is within normal limits. Coronary vascular calcifications noted. No acute osseous pathology. IMPRESSION: 1. No acute cardiopulmonary process. 2. Coronary vascular calcification. Electronically Signed   By: Anner Crete M.D.   On: 10/19/2017 21:06    Cardiac Studies   Cardiac catheterization 10/20/2017: Diagnostic Diagram       Post-Intervention Diagram        Doppler echocardiogram 09/01/2017: Impressions:  - Limited Definity enhanced study for LV wall motion and thrombus.   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   There is severe hypokinesis in the entire apex and the mid-apical   segments of the anterior wall, anterolateral wall and anterior   septum.   This is consistent with infarction in most of the LAD artery    territory.   Overall LV ejection fraction is approximately 35%.   Patient Profile     72 y.o. male with prior history of CAD including prior acute inferior infarct treated with stents greater than 10 years ago, hyperlipidemia, hypertension, cardiomyopathy with recent documentation of EF less than 35%.  Known to have an apical LV aneurysm.  Intermittent chest pain in diffuse high-grade obstruction in the LAD 10/20/2017.  Assessment & Plan    1. CAD with angina treated with mid LAD stent, successful. Dual antiplatelet therapy will include indefinite clopidogrel therapy. 2. Hyperlipidemia, LDL target less than 70 3. Stage III CKD: Stable despite contrast exposure yesterday.  Plan ambulate and discharge later today if no difficult.  Needs follow-up with Dr. Johnsie Cancel 2 to 4 weeks.   [facility] will sign off.   Medication Recommendations: Life long Plavix Other recommendations (labs, testing, etc): LDL target less than 70 -needs to be initiated PCSK9 therapy as an outpatient to achieve goal. Follow up as an outpatient: Dr. Johnsie Cancel 2- 4 weeks.  For questions or updates, please contact [place] Please consult [email or web address] for contact info under Cardiology/STEMI.      Signed, Sinclair Grooms, MD  10/21/2017, 9:20 AM

## 2017-10-21 NOTE — Progress Notes (Deleted)
Patient ID: [name]                 DOB: [birth year]                      MRN: [id number]     HPI: [name] is a 72 y.o. male patient of Dr. Johnsie Cancel who presents today for hypertension evaluation. PMH significant for CAD s/p anterior MI with stents to the LAD and LCx in 08/2010 in [place]. EF was 40% at that time with apparent mural apical thrombus but no coumadin was given. MI in 08/2010 with stent placed to RCA. LHC in 4/14 with patent stents.   He used a NTG about a week ago. NTG relieved pain, not pain since then. He has had some issues with increased constipation since starting Entresto. He reports that his blood pressure has been good. He also states that he has had some issues with ED, but this is not new for him. He reports that his has been taking his carvedilol only once daily.   Current HTN meds:  Carvedilol 3.125mg  BID - taking only once daily Entresto 24/26mg  BID  Previously tried: spironolactone  BP goal: <130/80  Family History: Coronary artery disease in his father and mother.  Social History: The patient  reports that he has quit smoking. His smoking use included cigarettes. He has a 15.00 pack-year smoking history. He has never used smokeless tobacco. He reports that he drinks about 1.2 oz of alcohol per week. He reports that he does not use drugs.  Diet: Most meals from out. He eats mostly at [employer]. He drinks mostly water he mixes energy booster with water. He drinks coffee one cup occasionally. He rarely adds salt.   Exercise: He does weights and curls most days.   Home BP readings: 120s/70s - occasional 130/80s   Wt Readings from Last 3 Encounters:  10/21/17 181 lb 6.4 oz (82.3 kg)  09/30/17 188 lb 12 oz (85.6 kg)  08/25/17 193 lb (87.5 kg)   BP Readings from Last 3 Encounters:  10/21/17 132/73  09/30/17 132/72  09/24/17 (!) 144/72   Pulse Readings from Last 3 Encounters:  10/21/17 (!) 45  09/30/17 78  09/24/17 (!) 57    Renal  function: Estimated Creatinine Clearance: 59.6 mL/min (by C-G formula based on SCr of 1.15 mg/dL).  Past Medical History:  Diagnosis Date  . Anxiety   . Arthritis    "wrists" (07/27/2014)  . Coronary artery disease    a.  Pt with 4 stents over the course of 2003-2012;  b.  LHC 6/16:  LAD, RCA, LCx stents patent, OM1 50%, EF 50-55% >> med rx   . Elevated LFTs   . GERD (gastroesophageal reflux disease)   . Hypercholesterolemia   . Hypertension   . Ischemic cardiomyopathy    cMRI 03/2011:  EF 48% with dist ant, septal, apical and inf-apical dyskinesia, no apical clot, dist ant, apical and septal full thickness scar >> No ICD needed  //  Limited echo 7/19: Large apical septal aneurysm, severe hypokinesis of the entire apex and mid to apical segments of the anterior wall, anterolateral wall and anterior septum-consistent with infarct and most of the LAD territory; EF 35   . Left ventricular aneurysm    Limited echo 7/19: Large apical septal aneurysm, severe hypokinesis of the entire apex and mid to apical segments of the anterior wall, anterolateral wall and anterior septum-consistent with infarct and most of the  LAD territory; EF 35  . Myocardial infarction [facility]) 2003    Current Facility-Administered Medications on File Prior to Visit  Medication Dose Route Frequency Provider Last Rate Last Dose  . 0.9 %  sodium chloride infusion  250 mL Intravenous PRN Sherren Mocha, MD      . acetaminophen (TYLENOL) tablet 650 mg  650 mg Oral Q4H PRN Sherren Mocha, MD   650 mg at 10/21/17 1104  . carvedilol (COREG) tablet 3.125 mg  3.125 mg Oral BID WC Sherren Mocha, MD   3.125 mg at 10/21/17 0900  . ciprofloxacin (CIPRO) tablet 500 mg  500 mg Oral BID Sherren Mocha, MD   500 mg at 10/21/17 1047  . clopidogrel (PLAVIX) tablet 75 mg  75 mg Oral Daily Sherren Mocha, MD   75 mg at 10/21/17 1048  . dicyclomine (BENTYL) tablet 20 mg  20 mg Oral BID Anselm Jungling, MD   20 mg at 10/21/17 1000  .  enoxaparin (LOVENOX) injection 40 mg  40 mg Subcutaneous Q24H Sherren Mocha, MD   Stopped at 10/20/17 [phone]  . LORazepam (ATIVAN) tablet 0.5 mg  0.5 mg Oral Q8H PRN Sherren Mocha, MD      . metroNIDAZOLE (FLAGYL) tablet 500 mg  500 mg Oral TID Sherren Mocha, MD   500 mg at 10/21/17 1047  . morphine 2 MG/ML injection 1-3 mg  1-3 mg Intravenous Q4H PRN Sherren Mocha, MD   3 mg at 10/19/17 2219  . nitroGLYCERIN (NITROSTAT) SL tablet 0.4 mg  0.4 mg Sublingual Q5 Min x 3 PRN Sherren Mocha, MD      . omega-3 acid ethyl esters (LOVAZA) capsule 1 g  1 g Oral Daily Sherren Mocha, MD   1 g at 10/21/17 1048  . ondansetron (ZOFRAN) injection 4 mg  4 mg Intravenous Q6H PRN Sherren Mocha, MD      . pantoprazole (PROTONIX) EC tablet 40 mg  40 mg Oral Daily Sherren Mocha, MD   40 mg at 10/21/17 1048  . polyethylene glycol (MIRALAX / GLYCOLAX) packet 17 g  17 g Oral QHS Sherren Mocha, MD   17 g at 10/19/17 2329  . saccharomyces boulardii (FLORASTOR) capsule 250 mg  250 mg Oral Daily Sherren Mocha, MD   250 mg at 10/21/17 1047  . sacubitril-valsartan (ENTRESTO) 97-103 mg per tablet  1 tablet Oral BID Sherren Mocha, MD   1 tablet at 10/21/17 1047  . sodium chloride flush (NS) 0.9 % injection 3 mL  3 mL Intravenous Q12H Sherren Mocha, MD      . sodium chloride flush (NS) 0.9 % injection 3 mL  3 mL Intravenous PRN Sherren Mocha, MD      . vitamin B-12 (CYANOCOBALAMIN) tablet 5,000 mcg  5,000 mcg Oral Daily Sherren Mocha, MD   5,000 mcg at 10/21/17 1048  . zolpidem (AMBIEN) tablet 5 mg  5 mg Oral QHS PRN Sherren Mocha, MD       Current Outpatient Medications on File Prior to Visit  Medication Sig Dispense Refill  . carvedilol (COREG) 3.125 MG tablet Take 1 tablet (3.125 mg total) by mouth 2 (two) times daily with a meal. 180 tablet 3  . ciprofloxacin (CIPRO) 500 MG tablet Take 500 mg by mouth 2 (two) times daily. 7 day course started 10/16/17 pm    . clopidogrel (PLAVIX) 75 MG tablet Take 1  tablet (75 mg total) by mouth daily. 30 tablet 3  . Cyanocobalamin (VITAMIN B-12) 5000 MCG LOZG Take 5,000 Units by  mouth daily.    Marland Kitchen dicyclomine (BENTYL) 20 MG tablet Take 20 mg by mouth 2 (two) times daily after a meal.     . LORazepam (ATIVAN) 0.5 MG tablet Take 1 tablet (0.5 mg total) by mouth every 8 (eight) hours as needed. For anxiety (Patient taking differently: Take 0.5 mg by mouth every 8 (eight) hours as needed for anxiety. ) 60 tablet 5  . metroNIDAZOLE (FLAGYL) 500 MG tablet Take 500 mg by mouth 3 (three) times daily. 7 day course filled 10/16/17    . nitroGLYCERIN (NITROSTAT) 0.4 MG SL tablet Place 0.4 mg under the tongue every 5 (five) minutes as needed for chest pain. For a max of 3 doses. If no relief, call 911.    . omega-3 acid ethyl esters (LOVAZA) 1 G capsule Take 1 g by mouth daily.    Marland Kitchen OVER THE COUNTER MEDICATION Take 1 capsule by mouth 3 (three) times daily. Herbal supplement from Dr. Ardeth Perfect  - to strengthen intestinal lining    . pantoprazole (PROTONIX) 40 MG tablet Take 1 tablet (40 mg total) by mouth daily. 30 tablet 11  . polyethylene glycol (MIRALAX / GLYCOLAX) packet Take 17 g by mouth at bedtime. Mix in 8 oz water and drink    . Probiotic Product (ALIGN) 4 MG CAPS Take 4 mg by mouth daily.     . sacubitril-valsartan (ENTRESTO) 97-103 MG Take 1 tablet by mouth 2 (two) times daily. 60 tablet 11  . vitamin C (ASCORBIC ACID) 500 MG tablet Take 500 mg by mouth daily.     Marland Kitchen zolpidem (AMBIEN) 10 MG tablet Take 1 tablet (10 mg total) by mouth at bedtime as needed. For sleep (Patient taking differently: Take 5 mg by mouth at bedtime as needed for sleep. ) 30 tablet 5  . [DISCONTINUED] spironolactone (ALDACTONE) 25 MG tablet Take 25 mg by mouth daily.       Allergies  Allergen Reactions  . Asa [Aspirin] Other (See Comments)    Abdominal bleeding  . Penicillins Anaphylaxis    Has patient had a PCN reaction causing immediate rash, facial/tongue/throat swelling, SOB or  lightheadedness with hypotension: Yes Has patient had a PCN reaction causing severe rash involving mucus membranes or skin necrosis: No Has patient had a PCN reaction that required hospitalization: No Has patient had a PCN reaction occurring within the last 10 years: No If all of the above answers are "NO", then may proceed with Cephalosporin use.  . Statins Other (See Comments)    Stiff joints, muscle tightness, couldn't walk    There were no vitals taken for this visit.   Assessment/Plan: Hypertension: BMET today. BP today is slightly above goal range. Will increase Entresto to mid dose. Also advised to take carvedilol as prescribed twice daily. May need to consider change to once daily option if not adherent to twice daily dosing. Follow up in HTN clinic in 3-4 weeks.     Thank you, Lelan Pons. Patterson Hammersmith, [place]  10/21/2017 2:54 PM  ADDENDUM: BMET returned WNL. Continue as above.

## 2017-10-21 NOTE — Progress Notes (Signed)
TR BAND REMOVAL  LOCATION:    right radial  DEFLATED PER PROTOCOL:    Yes.    TIME BAND OFF / DRESSING APPLIED:    2140   SITE UPON ARRIVAL:    Level 0  SITE AFTER BAND REMOVAL:    Level 0  CIRCULATION SENSATION AND MOVEMENT:    Within Normal Limits   Yes.    COMMENTS:   Post TRband instructions given, good cap refill

## 2017-10-22 ENCOUNTER — Ambulatory Visit: Payer: Medicare HMO

## 2017-10-22 ENCOUNTER — Other Ambulatory Visit: Payer: Medicare HMO

## 2017-10-28 ENCOUNTER — Telehealth (HOSPITAL_COMMUNITY): Payer: Self-pay

## 2017-10-28 NOTE — Telephone Encounter (Signed)
Patients insurance is active and benefits verified through [facility] - No co-pay, deductible amount of $183.00/$183.00 has been met, out of pocket amount of $6,700/$2,373.55 has been met, 20% co-insurance, and no pre-authorization is required. Passport/reference [phone]

## 2017-10-30 ENCOUNTER — Encounter: Payer: Self-pay | Admitting: Pharmacist

## 2017-10-30 ENCOUNTER — Ambulatory Visit (INDEPENDENT_AMBULATORY_CARE_PROVIDER_SITE_OTHER): Payer: Medicare HMO | Admitting: Pharmacist

## 2017-10-30 ENCOUNTER — Other Ambulatory Visit: Payer: Medicare HMO | Admitting: *Deleted

## 2017-10-30 VITALS — BP 108/64 | HR 76

## 2017-10-30 DIAGNOSIS — I5023 Acute on chronic systolic (congestive) heart failure: Secondary | ICD-10-CM | POA: Diagnosis not present

## 2017-10-30 DIAGNOSIS — Z79899 Other long term (current) drug therapy: Secondary | ICD-10-CM | POA: Diagnosis not present

## 2017-10-30 DIAGNOSIS — I255 Ischemic cardiomyopathy: Secondary | ICD-10-CM | POA: Diagnosis not present

## 2017-10-30 DIAGNOSIS — I10 Essential (primary) hypertension: Secondary | ICD-10-CM | POA: Diagnosis not present

## 2017-10-30 LAB — PRO B NATRIURETIC PEPTIDE: NT-Pro BNP: 37 pg/mL (ref 0–376)

## 2017-10-30 NOTE — Patient Instructions (Addendum)
Return for a follow up appointment in as schedule with Estella Husk, PA  Your blood pressure goal is less than 130/80  Check your blood pressure at home daily (if able) and keep record of the readings.  Take your BP meds as follows: Continue Entresto 97/103mg  twice daily and carvedilol 3.125mg  twice daily   Bring all of your meds, your BP cuff and your record of home blood pressures to your next appointment.  Exercise as you're able, try to walk approximately 30 minutes per day.  Keep salt intake to a minimum, especially watch canned and prepared boxed foods.  Eat more fresh fruits and vegetables and fewer canned items.  Avoid eating in fast food restaurants.    HOW TO TAKE YOUR BLOOD PRESSURE: . Rest 5 minutes before taking your blood pressure. .  Don't smoke or drink caffeinated beverages for at least 30 minutes before. . Take your blood pressure before (not after) you eat. . Sit comfortably with your back supported and both feet on the floor (don't cross your legs). . Elevate your arm to heart level on a table or a desk. . Use the proper sized cuff. It should fit smoothly and snugly around your bare upper arm. There should be enough room to slip a fingertip under the cuff. The bottom edge of the cuff should be 1 inch above the crease of the elbow. . Ideally, take 3 measurements at one sitting and record the average.

## 2017-10-30 NOTE — Progress Notes (Signed)
Patient ID: [name]                 DOB: [birth year]                      MRN: [id number]     HPI: [name] is a 72 y.o. male patient of Dr. Johnsie Cancel who presents today for hypertension evaluation. PMH significant for CAD s/p anterior MI with stents to the LAD and LCx in 08/2010 in [place]. EF was 40% at that time with apparent mural apical thrombus but no coumadin was given. MI in 08/2010 with stent placed to RCA. Since seeing me last he was admitted for chest pain. His Entresto was increased to max dose.   He presents today for follow up of Entresto titration. He states that he is doing well since hospital admission. He denies dizziness, SOB, chest pain.   Current HTN meds:  Carvedilol 3.125mg  BID Entresto 97/103mg  BID  Previously tried: spironolactone (unknown side effect)  BP goal: <130/80  Family History: Coronary artery disease in his father and mother.  Social History: The patient  reports that he has quit smoking. His smoking use included cigarettes. He has a 15.00 pack-year smoking history. He has never used smokeless tobacco. He reports that he drinks about 1.2 oz of alcohol per week. He reports that he does not use drugs.  Diet: Most meals from out. He eats mostly at [employer]. He drinks mostly water he mixes energy booster with water. He drinks coffee one cup occasionally. He rarely adds salt.   Exercise: He does weights and curls most days.   Home BP readings: 110s/70s HR 60s  Wt Readings from Last 3 Encounters:  10/21/17 181 lb 6.4 oz (82.3 kg)  09/30/17 188 lb 12 oz (85.6 kg)  08/25/17 193 lb (87.5 kg)   BP Readings from Last 3 Encounters:  10/30/17 108/64  10/21/17 132/73  09/30/17 132/72   Pulse Readings from Last 3 Encounters:  10/30/17 76  10/21/17 (!) 45  09/30/17 78    Renal function: Estimated Creatinine Clearance: 59.6 mL/min (by C-G formula based on SCr of 1.15 mg/dL).  Past Medical History:  Diagnosis Date  . Anxiety   .  Arthritis    "wrists" (07/27/2014)  . Coronary artery disease    a.  Pt with 4 stents over the course of 2003-2012;  b.  LHC 6/16:  LAD, RCA, LCx stents patent, OM1 50%, EF 50-55% >> med rx   . Elevated LFTs   . GERD (gastroesophageal reflux disease)   . Hypercholesterolemia   . Hypertension   . Ischemic cardiomyopathy    cMRI 03/2011:  EF 48% with dist ant, septal, apical and inf-apical dyskinesia, no apical clot, dist ant, apical and septal full thickness scar >> No ICD needed  //  Limited echo 7/19: Large apical septal aneurysm, severe hypokinesis of the entire apex and mid to apical segments of the anterior wall, anterolateral wall and anterior septum-consistent with infarct and most of the LAD territory; EF 35   . Left ventricular aneurysm    Limited echo 7/19: Large apical septal aneurysm, severe hypokinesis of the entire apex and mid to apical segments of the anterior wall, anterolateral wall and anterior septum-consistent with infarct and most of the LAD territory; EF 35  . Myocardial infarction [facility]) 2003    Current Outpatient Medications on File Prior to Visit  Medication Sig Dispense Refill  . carvedilol (COREG) 3.125 MG tablet Take 1  tablet (3.125 mg total) by mouth 2 (two) times daily with a meal. 180 tablet 3  . clopidogrel (PLAVIX) 75 MG tablet Take 1 tablet (75 mg total) by mouth daily. 30 tablet 3  . Cyanocobalamin (VITAMIN B-12) 5000 MCG LOZG Take 5,000 Units by mouth daily.    Marland Kitchen dicyclomine (BENTYL) 20 MG tablet Take 20 mg by mouth 2 (two) times daily after a meal.     . LORazepam (ATIVAN) 0.5 MG tablet Take 1 tablet (0.5 mg total) by mouth every 8 (eight) hours as needed. For anxiety (Patient taking differently: Take 0.5 mg by mouth every 8 (eight) hours as needed for anxiety. ) 60 tablet 5  . nitroGLYCERIN (NITROSTAT) 0.4 MG SL tablet Place 0.4 mg under the tongue every 5 (five) minutes as needed for chest pain. For a max of 3 doses. If no relief, call 911.    . omega-3 acid  ethyl esters (LOVAZA) 1 G capsule Take 1 g by mouth daily.    Marland Kitchen OVER THE COUNTER MEDICATION Take 1 capsule by mouth 3 (three) times daily. Herbal supplement from Dr. Ardeth Perfect  - to strengthen intestinal lining - mixture of fruits and vegetables    . pantoprazole (PROTONIX) 40 MG tablet Take 1 tablet (40 mg total) by mouth daily. 30 tablet 11  . polyethylene glycol (MIRALAX / GLYCOLAX) packet Take 17 g by mouth at bedtime. Mix in 8 oz water and drink    . Probiotic Product (ALIGN) 4 MG CAPS Take 4 mg by mouth daily.     . sacubitril-valsartan (ENTRESTO) 97-103 MG Take 1 tablet by mouth 2 (two) times daily. 60 tablet 11  . vitamin C (ASCORBIC ACID) 500 MG tablet Take 500 mg by mouth daily.     Marland Kitchen zolpidem (AMBIEN) 10 MG tablet Take 1 tablet (10 mg total) by mouth at bedtime as needed. For sleep (Patient taking differently: Take 5 mg by mouth at bedtime as needed for sleep. ) 30 tablet 5  . [DISCONTINUED] spironolactone (ALDACTONE) 25 MG tablet Take 25 mg by mouth daily.      No current facility-administered medications on file prior to visit.     Allergies  Allergen Reactions  . Asa [Aspirin] Other (See Comments)    Abdominal bleeding  . Penicillins Anaphylaxis    Has patient had a PCN reaction causing immediate rash, facial/tongue/throat swelling, SOB or lightheadedness with hypotension: Yes Has patient had a PCN reaction causing severe rash involving mucus membranes or skin necrosis: No Has patient had a PCN reaction that required hospitalization: No Has patient had a PCN reaction occurring within the last 10 years: No If all of the above answers are "NO", then may proceed with Cephalosporin use.  . Statins Other (See Comments)    Stiff joints, muscle tightness, couldn't walk    Blood pressure 108/64, pulse 76.   Assessment/Plan: Hypertension: Continue medications as prescribed and follow up with PA as scheduled. Would consider increasing carvedilol if pressures can tolerate. Given  low-normal pressure today will continue medications as prescribed. Follow up with HTN clinic as needed.   Thank you, Lelan Pons. Patterson Hammersmith, [facility]  10/30/2017 4:47 PM

## 2017-11-02 ENCOUNTER — Encounter: Payer: Self-pay | Admitting: Physician Assistant

## 2017-11-03 ENCOUNTER — Ambulatory Visit (INDEPENDENT_AMBULATORY_CARE_PROVIDER_SITE_OTHER): Payer: Medicare HMO | Admitting: Physician Assistant

## 2017-11-03 ENCOUNTER — Encounter: Payer: Self-pay | Admitting: Physician Assistant

## 2017-11-03 ENCOUNTER — Telehealth (HOSPITAL_COMMUNITY): Payer: Self-pay

## 2017-11-03 VITALS — BP 132/58 | HR 70 | Ht 67.0 in | Wt 172.2 lb

## 2017-11-03 DIAGNOSIS — I2510 Atherosclerotic heart disease of native coronary artery without angina pectoris: Secondary | ICD-10-CM | POA: Diagnosis not present

## 2017-11-03 DIAGNOSIS — E785 Hyperlipidemia, unspecified: Secondary | ICD-10-CM | POA: Diagnosis not present

## 2017-11-03 DIAGNOSIS — I10 Essential (primary) hypertension: Secondary | ICD-10-CM

## 2017-11-03 DIAGNOSIS — N183 Chronic kidney disease, stage 3 unspecified: Secondary | ICD-10-CM

## 2017-11-03 DIAGNOSIS — Z9861 Coronary angioplasty status: Secondary | ICD-10-CM

## 2017-11-03 DIAGNOSIS — I255 Ischemic cardiomyopathy: Secondary | ICD-10-CM | POA: Diagnosis not present

## 2017-11-03 DIAGNOSIS — Z8719 Personal history of other diseases of the digestive system: Secondary | ICD-10-CM

## 2017-11-03 NOTE — Progress Notes (Signed)
Cardiology Office Note    Date:  11/03/2017   ID:  [name], DOB Oct 13, 1945, MRN [id number]  PCP:  Velna Hatchet, MD  Cardiologist: Jenkins Rouge, MD EPS: None  Chief Complaint  Patient presents with  . Hospitalization Follow-up    History of Present Illness:  [name] is a 72 y.o. male with history of CAD status post anterior MI treated with stent to the LAD and circumflex 08/2010 in [place], EF 40% at that time with mural apical thrombus but no Coumadin was given.  MI 08/2010 treated with stent to the RCA.  Aloe up cath in 2016 patent stents LVEF 50%.    Last cath 10/20/2017 for unstable angina treated with overlapping DES to the mid LAD with plans for indefinite clopidogrel therapy since he has an aspirin allergy.  Follow-up 2D echo LVEF 35%.  Plan for MRI 11/2017 to see if he should be considered for AICD.  GI bleed in [place] 04/2017 with large erosions of the duodenal bulb that was clipped.  Also has CKD stage III, HLD, hypertension most recently seen in the hypertensive clinic 10/30/2017 and blood pressure was low normal so no changes were made.   Patient comes in today for follow-up.  Overall he is feeling well.  Walking without difficulty.  Denies chest pain, palpitations, dyspnea, dyspnea on exertion, dizziness or presyncope.  Asking if he has to wait to have the MRI since he has new stents.    Past Medical History:  Diagnosis Date  . Anxiety   . Arthritis    "wrists" (07/27/2014)  . Coronary artery disease    a.  Pt with 4 stents over the course of 2003-2012;  b.  LHC 6/16:  LAD, RCA, LCx stents patent, OM1 50%, EF 50-55% >> med rx   . Elevated LFTs   . GERD (gastroesophageal reflux disease)   . Hypercholesterolemia   . Hypertension   . Ischemic cardiomyopathy    cMRI 03/2011:  EF 48% with dist ant, septal, apical and inf-apical dyskinesia, no apical clot, dist ant, apical and septal full thickness scar >> No ICD needed  //  Limited echo 7/19: Large apical septal  aneurysm, severe hypokinesis of the entire apex and mid to apical segments of the anterior wall, anterolateral wall and anterior septum-consistent with infarct and most of the LAD territory; EF 35   . Left ventricular aneurysm    Limited echo 7/19: Large apical septal aneurysm, severe hypokinesis of the entire apex and mid to apical segments of the anterior wall, anterolateral wall and anterior septum-consistent with infarct and most of the LAD territory; EF 35  . Myocardial infarction [facility]) 2003    Past Surgical History:  Procedure Laterality Date  . CARDIAC CATHETERIZATION  06/11/2012   Nonobstructive CAD, patent stents, EF 50%, mild apical dyskinesis  . CARDIAC CATHETERIZATION N/A 07/28/2014   Procedure: Left Heart Cath and Coronary Angiography;  Surgeon: Troy Sine, MD;  Location: [place] [facility];  Service: Cardiovascular;  Laterality: N/A;  . CORONARY ANGIOPLASTY WITH STENT PLACEMENT  2003-2012   total of 4 stents place  . CORONARY STENT INTERVENTION N/A 10/20/2017   Procedure: CORONARY STENT INTERVENTION;  Surgeon: Sherren Mocha, MD;  Location: [place] [facility];  Service: Cardiovascular;  Laterality: N/A;  . COSMETIC SURGERY Left 1970's   "dog ripped of my ear"  . LEFT HEART CATH AND CORONARY ANGIOGRAPHY N/A 10/20/2017   Procedure: LEFT HEART CATH AND CORONARY ANGIOGRAPHY;  Surgeon: Sherren Mocha, MD;  Location: [place] [facility];  Service: Cardiovascular;  Laterality: N/A;  . LEFT HEART CATHETERIZATION WITH CORONARY ANGIOGRAM N/A 06/11/2012   Procedure: LEFT HEART CATHETERIZATION WITH CORONARY ANGIOGRAM;  Surgeon: Peter M Martinique, MD;  Location: [facility];  Service: Cardiovascular;  Laterality: N/A;    Current Medications: Current Meds  Medication Sig  . carvedilol (COREG) 3.125 MG tablet Take 1 tablet (3.125 mg total) by mouth 2 (two) times daily with a meal.  . clopidogrel (PLAVIX) 75 MG tablet Take 1 tablet (75 mg total) by mouth daily.  . Cyanocobalamin (VITAMIN  B-12) 5000 MCG LOZG Take 5,000 Units by mouth daily.  Marland Kitchen dicyclomine (BENTYL) 20 MG tablet Take 20 mg by mouth 2 (two) times daily after a meal.   . LORazepam (ATIVAN) 0.5 MG tablet Take 1 tablet (0.5 mg total) by mouth every 8 (eight) hours as needed. For anxiety (Patient taking differently: Take 0.5 mg by mouth every 8 (eight) hours as needed for anxiety. )  . nitroGLYCERIN (NITROSTAT) 0.4 MG SL tablet Place 0.4 mg under the tongue every 5 (five) minutes as needed for chest pain. For a max of 3 doses. If no relief, call 911.  . omega-3 acid ethyl esters (LOVAZA) 1 G capsule Take 1 g by mouth daily.  Marland Kitchen OVER THE COUNTER MEDICATION Take 1 capsule by mouth 3 (three) times daily. Herbal supplement from Dr. Ardeth Perfect  - to strengthen intestinal lining - mixture of fruits and vegetables  . pantoprazole (PROTONIX) 40 MG tablet Take 1 tablet (40 mg total) by mouth daily.  . polyethylene glycol (MIRALAX / GLYCOLAX) packet Take 17 g by mouth at bedtime. Mix in 8 oz water and drink  . Probiotic Product (ALIGN) 4 MG CAPS Take 4 mg by mouth daily.   . sacubitril-valsartan (ENTRESTO) 97-103 MG Take 1 tablet by mouth 2 (two) times daily.  . vitamin C (ASCORBIC ACID) 500 MG tablet Take 500 mg by mouth daily.   Marland Kitchen zolpidem (AMBIEN) 10 MG tablet Take 1 tablet (10 mg total) by mouth at bedtime as needed. For sleep (Patient taking differently: Take 5 mg by mouth at bedtime as needed for sleep. )     Allergies:   Asa [aspirin]; Penicillins; and Statins   Social History   Socioeconomic History  . Marital status: Divorced    Spouse name: Not on file  . Number of children: Not on file  . Years of education: Not on file  . Highest education level: Not on file  Occupational History  . Not on file  Social Needs  . Financial resource strain: Not on file  . Food insecurity:    Worry: Not on file    Inability: Not on file  . Transportation needs:    Medical: Not on file    Non-medical: Not on file  Tobacco Use  .  Smoking status: Former Smoker    Packs/day: 1.00    Years: 15.00    Pack years: 15.00    Types: Cigarettes  . Smokeless tobacco: Never Used  . Tobacco comment: "quit smoking cigarettes in 1979"  Substance and Sexual Activity  . Alcohol use: Yes    Alcohol/week: 2.0 standard drinks    Types: 2 Glasses of wine per week  . Drug use: No  . Sexual activity: Not Currently  Lifestyle  . Physical activity:    Days per week: Not on file    Minutes per session: Not on file  . Stress: Not on file  Relationships  .  Social connections:    Talks on phone: Not on file    Gets together: Not on file    Attends religious service: Not on file    Active member of club or organization: Not on file    Attends meetings of clubs or organizations: Not on file    Relationship status: Not on file  Other Topics Concern  . Not on file  Social History Narrative  . Not on file     Family History:  The patient's family history includes Coronary artery disease in his father and mother.   ROS:   Please see the history of present illness.    Review of Systems  Constitution: Negative.  HENT: Negative.   Cardiovascular: Negative.   Respiratory: Negative.   Endocrine: Negative.   Hematologic/Lymphatic: Negative.   Musculoskeletal: Negative.   Gastrointestinal: Negative.   Genitourinary: Negative.   Neurological: Negative.    All other systems reviewed and are negative.   PHYSICAL EXAM:   VS:  BP (!) 132/58   Pulse 70   Ht 5\' 7"  (1.702 m)   Wt 172 lb 3.2 oz (78.1 kg)   SpO2 97%   BMI 26.97 kg/m   Physical Exam  GEN: Well nourished, well developed, in no acute distress  Neck: no JVD, carotid bruits, or masses Cardiac:RRR; no murmurs, rubs, or gallops  Respiratory:  clear to auscultation bilaterally, normal work of breathing GI: soft, nontender, nondistended, + BS Ext: Right arm at cath site without hematoma or hemorrhage, lower extremities without cyanosis, clubbing, or edema, Good distal  pulses bilaterally Neuro:  Alert and Oriented x 3 Psych: euthymic mood, full affect  Wt Readings from Last 3 Encounters:  11/03/17 172 lb 3.2 oz (78.1 kg)  10/21/17 181 lb 6.4 oz (82.3 kg)  09/30/17 188 lb 12 oz (85.6 kg)      Studies/Labs Reviewed:   EKG:  EKG is not ordered today.   Recent Labs: 10/21/2017: BUN 12; Creatinine, Ser 1.15; Hemoglobin 14.2; Platelets 228; Potassium 4.0; Sodium 138 10/30/2017: NT-Pro BNP 37   Lipid Panel    Component Value Date/Time   CHOL 180 06/11/2012 0234   TRIG 367 (H) 06/11/2012 0234   HDL 34 (L) 06/11/2012 0234   CHOLHDL 5.3 06/11/2012 0234   VLDL 73 (H) 06/11/2012 0234   LDLCALC 73 06/11/2012 0234    Additional studies/ records that were reviewed today include:   Cardiac catheterization 8/27/2019Ost 1st Mrg to 1st Mrg lesion is 75% stenosed.  Non-stenotic Ost RCA to Mid RCA lesion was previously treated.  Mid RCA to Dist RCA lesion is 40% stenosed.  Prox Cx to Mid Cx lesion is 30% stenosed.  Prox LAD to Mid LAD lesion is 80% stenosed.  A drug-eluting stent was successfully placed using a STENT RESOLUTE ONYX 2.5X38.  Post intervention, there is a 0% residual stenosis.   1.  Multivessel coronary artery disease with continued patency of the stented segment in the RCA, continued patency of the stented segment in the left circumflex with mild in-stent restenosis and severe stenosis of a small obtuse marginal branch not suitable for PCI, and severe diffuse stenosis of the proximal and mid LAD treated successfully with overlapping resolute Onyx drug-eluting stents 2.  Known severe LV systolic dysfunction   Recommendation: The patient should continue on clopidogrel alone.  He is intolerant to aspirin.  I would favor long-term clopidogrel since he is intolerant to aspirin and has been treated with multiple stents.   Recommend dual antiplatelet therapy  with Clopidogrel 75mg  daily long-term (beyond 12 months) because of multiple stents,  severe LV dysfunction, ASA intolerance. Echo 09/01/17 Impressions:   - Limited Definity enhanced study for LV wall motion and thrombus.   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   There is severe hypokinesis in the entire apex and the mid-apical   segments of the anterior wall, anterolateral wall and anterior   septum.   This is consistent with infarction in most of the LAD artery   territory.   Overall LV ejection fraction is approximately 35%.     Left Heart Cath and Coronary Angiography  07/2014  Conclusion     Ost 1st Mrg to 1st Mrg lesion, 50% stenosed.  Ost LAD to Dist LAD lesion, 20% stenosed.   Low normal LV function with an ejection fraction of 50-55% with evidence for an akinetic/dyskinetic aneurysmal apex.   Coronary artery  disease with patent tandem proximal to mid LAD stents with smooth 20% in-stent narrowing in the distal aspect of the stent; patent  left circumflex stent with evidence for 50-60% proximal narrowing in a small marginal branch which arises with aupper takeoff from this stented segment; and widely patent RCA stent in a dominant RCA system.   RECOMMENDATION:   Medical therapy.       ASSESSMENT:    1. CAD S/P percutaneous coronary angioplasty   2. Ischemic cardiomyopathy   3. Essential hypertension   4. CKD (chronic kidney disease), stage III ([place])   5. Hyperlipidemia with target LDL less than 100   6. History of GI bleed      PLAN:  In order of problems listed above:  CAD status post MIs in 2012 in [place] treated with DES to the LAD circumflex and RCA.  Recent admission with unstable angina treated with overlapping DES to the LAD 10/20/2017.  He is aspirin intolerant so plan for long-term clopidogrel.  No angina.  To do cardiac rehab.  Ischemic cardiomyopathy ejection fraction 35% on echo 08/2017.  Started on [place] with plans for follow-up MRI October 2019 to see if he should be considered for AICD. Verified he doesn't have to wait for MRI  after recent stents. Follow-up with Dr. Johnsie Cancel already scheduled.  Essential hypertension seen the hypertensive clinic recently and blood pressure was stable.  Blood pressure stable today.  Does have room to increase carvedilol but has had heart rates down the 40s on higher doses so we will not make any changes today.  CKD stage III creatinine was 1.15 at discharge 10/21/2017  Hyperlipidemia LDL was 127 triglycerides 205 08/17/2017 on lovaza-if lipids don't come down should be candidate for PCSK9I  History of GI bleed in [place] 04/2017 secondary to large erosion and duodenal bulb that was clipped.    Medication Adjustments/Labs and Tests Ordered: Current medicines are reviewed at length with the patient today.  Concerns regarding medicines are outlined above.  Medication changes, Labs and Tests ordered today are listed in the Patient Instructions below. Patient Instructions  Medication Instructions:  Your physician recommends that you continue on your current medications as directed. Please refer to the Current Medication list given to you today.   Labwork: None ordered  Testing/Procedures: None ordered  Follow-Up: Your physician wants keep follow-up in: November with Dr. Johnsie Cancel   Any Other Special Instructions Will Be Listed Below (If Applicable).     If you need a refill on your cardiac medications before your next appointment, please call your pharmacy.      Signed,  Ermalinda Barrios, PA-C  11/03/2017 1:39 PM    [facility] [address], [place],  [place]  [address] Phone: [phone]; Fax: [phone]

## 2017-11-03 NOTE — Patient Instructions (Addendum)
Medication Instructions:  Your physician recommends that you continue on your current medications as directed. Please refer to the Current Medication list given to you today.   Labwork: None ordered  Testing/Procedures: None ordered  Follow-Up: Your physician wants keep follow-up in: November with Dr. Johnsie Cancel   Any Other Special Instructions Will Be Listed Below (If Applicable).     If you need a refill on your cardiac medications before your next appointment, please call your pharmacy.

## 2017-11-03 NOTE — Telephone Encounter (Signed)
Called patient to see if he was interested in participating in the Cardiac Rehab Program. Patient stated yes. Patient will come in for orientation on 12/24/17 @ 8:30AM and will attend the 2:45PM exercise class.  Mailed homework package.  Went over insurance, patient verbalized understanding.

## 2017-11-11 ENCOUNTER — Telehealth: Payer: Self-pay | Admitting: Physician Assistant

## 2017-11-11 NOTE — Telephone Encounter (Signed)
New Message   Pt states that he just had an angioplasty and wants to know if its ok for him to cut the grass. Please call

## 2017-11-11 NOTE — Telephone Encounter (Signed)
Per pt feels okay since catherization is wanting to know if may cut grass with a self propelled mower. Discussed with Richardson Dopp [facility] and if pt has had f/u and has had no complaints may cut grass . Pt notified may cut grass .Adonis Housekeeper

## 2017-12-09 ENCOUNTER — Ambulatory Visit (HOSPITAL_COMMUNITY): Admission: RE | Admit: 2017-12-09 | Payer: Medicare HMO | Source: Ambulatory Visit

## 2017-12-15 ENCOUNTER — Telehealth (HOSPITAL_COMMUNITY): Payer: Self-pay

## 2017-12-15 NOTE — Telephone Encounter (Signed)
Cardiac Rehab Medication Review by a Pharmacist  Does the patient  feel that his/her medications are working for him/her?  yes  Has the patient been experiencing any side effects to the medications prescribed?  no  Does the patient measure his/her own blood pressure or blood glucose at home?  yes ; 103/67 HR 74; 107/67  Does the patient have any problems obtaining medications due to transportation or finances?   no  Understanding of regimen: good Understanding of indications: good Potential of compliance: good    Pharmacist comments: Patient is doing well on therapy. He inquired about Patriot Power supplement. Using Lexicomp interaction checker there are no interactions with the supplement and his current medications.    Isaias Sakai, Sherian Rein D PGY1 Pharmacy Resident  12/15/2017      6:19 PM

## 2017-12-21 ENCOUNTER — Ambulatory Visit: Payer: Medicare HMO | Admitting: Cardiovascular Disease

## 2017-12-23 NOTE — Progress Notes (Signed)
[name] 72 y.o. male DOB 01/28/1946 MRN [id number]       Nutrition  No diagnosis found. Past Medical History:  Diagnosis Date  . Anxiety   . Arthritis    "wrists" (07/27/2014)  . Coronary artery disease    a.  Pt with 4 stents over the course of 2003-2012;  b.  LHC 6/16:  LAD, RCA, LCx stents patent, OM1 50%, EF 50-55% >> med rx   . Elevated LFTs   . GERD (gastroesophageal reflux disease)   . Hypercholesterolemia   . Hypertension   . Ischemic cardiomyopathy    cMRI 03/2011:  EF 48% with dist ant, septal, apical and inf-apical dyskinesia, no apical clot, dist ant, apical and septal full thickness scar >> No ICD needed  //  Limited echo 7/19: Large apical septal aneurysm, severe hypokinesis of the entire apex and mid to apical segments of the anterior wall, anterolateral wall and anterior septum-consistent with infarct and most of the LAD territory; EF 35   . Left ventricular aneurysm    Limited echo 7/19: Large apical septal aneurysm, severe hypokinesis of the entire apex and mid to apical segments of the anterior wall, anterolateral wall and anterior septum-consistent with infarct and most of the LAD territory; EF 35  . Myocardial infarction [facility]) 2003   Meds reviewed.     Current Outpatient Medications (Cardiovascular):  .  carvedilol (COREG) 3.125 MG tablet, Take 1 tablet (3.125 mg total) by mouth 2 (two) times daily with a meal. .  nitroGLYCERIN (NITROSTAT) 0.4 MG SL tablet, Place 0.4 mg under the tongue every 5 (five) minutes as needed for chest pain. For a max of 3 doses. If no relief, call 911. .  omega-3 acid ethyl esters (LOVAZA) 1 G capsule, Take 1 g by mouth daily. .  sacubitril-valsartan (ENTRESTO) 97-103 MG, Take 1 tablet by mouth 2 (two) times daily.    Current Outpatient Medications (Hematological):  .  clopidogrel (PLAVIX) 75 MG tablet, Take 1 tablet (75 mg total) by mouth daily. .  Cyanocobalamin (VITAMIN B-12) 5000 MCG LOZG, Take 5,000 Units by mouth  daily.  Current Outpatient Medications (Other):  .  dicyclomine (BENTYL) 20 MG tablet, Take 20 mg by mouth 2 (two) times daily after a meal.  .  LORazepam (ATIVAN) 0.5 MG tablet, Take 1 tablet (0.5 mg total) by mouth every 8 (eight) hours as needed. For anxiety (Patient taking differently: Take 0.5 mg by mouth every 8 (eight) hours as needed for anxiety. ) .  OVER THE COUNTER MEDICATION, Take 1 capsule by mouth 3 (three) times daily. Herbal supplement from Dr. Ardeth Perfect  - to strengthen intestinal lining - mixture of fruits and vegetables .  OVER THE COUNTER MEDICATION, Take 1 Scoop by mouth daily. Patriot Powder  Contains 20 fruits & vegetables, 10 probiotics and 6 digestive enzymes .  pantoprazole (PROTONIX) 40 MG tablet, Take 1 tablet (40 mg total) by mouth daily. .  polyethylene glycol (MIRALAX / GLYCOLAX) packet, Take 17 g by mouth at bedtime. Mix in 8 oz water and drink .  Probiotic Product (ALIGN) 4 MG CAPS, Take 4 mg by mouth daily.  .  vitamin C (ASCORBIC ACID) 500 MG tablet, Take 500 mg by mouth daily.  Marland Kitchen  zolpidem (AMBIEN) 10 MG tablet, Take 1 tablet (10 mg total) by mouth at bedtime as needed. For sleep (Patient taking differently: Take 5 mg by mouth at bedtime as needed for sleep. )   HT: Ht Readings from Last 1 Encounters:  11/03/17 5\' 7"  (1.702 m)    WT: Wt Readings from Last 5 Encounters:  11/03/17 172 lb 3.2 oz (78.1 kg)  10/21/17 181 lb 6.4 oz (82.3 kg)  09/30/17 188 lb 12 oz (85.6 kg)  08/25/17 193 lb (87.5 kg)  07/17/15 185 lb (83.9 kg)      BMI = 26.96 (11/03/17)   Current tobacco use? No       Labs:  Lipid Panel     Component Value Date/Time   CHOL 180 06/11/2012 0234   TRIG 367 (H) 06/11/2012 0234   HDL 34 (L) 06/11/2012 0234   CHOLHDL 5.3 06/11/2012 0234   VLDL 73 (H) 06/11/2012 0234   LDLCALC 73 06/11/2012 0234    Lab Results  Component Value Date   HGBA1C 6.0 (H) 01/08/2011   CBG (last 3)  No results for input(s): GLUCAP in the last 72  hours.  Nutrition Diagnosis ? Food-and nutrition-related knowledge deficit related to lack of exposure to information as related to diagnosis of: ? CVD ? Pre-diabetes ? Overweight  related to excessive energy intake as evidenced by a 26.96Excessive sodium intake related to over consumption of processed food as evidenced by frequent consumption of convenience food/ canned vegetables and eating out frequently.   Nutrition Goal(s):  ? To be determined  Plan:  Pt to attend nutrition classes ? Nutrition I ? Nutrition II ? Portion Distortion  ? Diabetes Blitz ? Diabetes Q & A Will provide client-centered nutrition education as part of interdisciplinary care.   Monitor and evaluate progress toward nutrition goal with team.  Laurina Bustle, MS, RD, LDN 12/23/2017 3:30 PM

## 2017-12-24 ENCOUNTER — Encounter (HOSPITAL_COMMUNITY)
Admission: RE | Admit: 2017-12-24 | Discharge: 2017-12-24 | Disposition: A | Discharge status: Home / Self Care | Payer: Medicare HMO | Source: Ambulatory Visit | Attending: Cardiovascular Disease | Admitting: Cardiovascular Disease

## 2017-12-24 ENCOUNTER — Encounter (HOSPITAL_COMMUNITY): Payer: Self-pay

## 2017-12-24 VITALS — Ht 66.0 in | Wt 191.4 lb

## 2017-12-24 DIAGNOSIS — I10 Essential (primary) hypertension: Secondary | ICD-10-CM | POA: Diagnosis not present

## 2017-12-24 DIAGNOSIS — Z87891 Personal history of nicotine dependence: Secondary | ICD-10-CM | POA: Insufficient documentation

## 2017-12-24 DIAGNOSIS — K219 Gastro-esophageal reflux disease without esophagitis: Secondary | ICD-10-CM | POA: Insufficient documentation

## 2017-12-24 DIAGNOSIS — Z955 Presence of coronary angioplasty implant and graft: Secondary | ICD-10-CM | POA: Insufficient documentation

## 2017-12-24 DIAGNOSIS — I255 Ischemic cardiomyopathy: Secondary | ICD-10-CM | POA: Diagnosis not present

## 2017-12-24 DIAGNOSIS — M1990 Unspecified osteoarthritis, unspecified site: Secondary | ICD-10-CM | POA: Insufficient documentation

## 2017-12-24 DIAGNOSIS — I252 Old myocardial infarction: Secondary | ICD-10-CM | POA: Diagnosis not present

## 2017-12-24 DIAGNOSIS — I2510 Atherosclerotic heart disease of native coronary artery without angina pectoris: Secondary | ICD-10-CM | POA: Diagnosis not present

## 2017-12-24 DIAGNOSIS — F419 Anxiety disorder, unspecified: Secondary | ICD-10-CM | POA: Diagnosis not present

## 2017-12-24 DIAGNOSIS — Z7902 Long term (current) use of antithrombotics/antiplatelets: Secondary | ICD-10-CM | POA: Diagnosis not present

## 2017-12-24 DIAGNOSIS — Z79899 Other long term (current) drug therapy: Secondary | ICD-10-CM | POA: Insufficient documentation

## 2017-12-24 DIAGNOSIS — E7800 Pure hypercholesterolemia, unspecified: Secondary | ICD-10-CM | POA: Insufficient documentation

## 2017-12-24 NOTE — Progress Notes (Signed)
[name] 72 y.o. male DOB: [birth year] MRN: [id number]      Nutrition Note  1. Status post coronary artery stent placement    Past Medical History:  Diagnosis Date  . Anxiety   . Arthritis    "wrists" (07/27/2014)  . Coronary artery disease    a.  Pt with 4 stents over the course of 2003-2012;  b.  LHC 6/16:  LAD, RCA, LCx stents patent, OM1 50%, EF 50-55% >> med rx   . Elevated LFTs   . GERD (gastroesophageal reflux disease)   . Hypercholesterolemia   . Hypertension   . Ischemic cardiomyopathy    cMRI 03/2011:  EF 48% with dist ant, septal, apical and inf-apical dyskinesia, no apical clot, dist ant, apical and septal full thickness scar >> No ICD needed  //  Limited echo 7/19: Large apical septal aneurysm, severe hypokinesis of the entire apex and mid to apical segments of the anterior wall, anterolateral wall and anterior septum-consistent with infarct and most of the LAD territory; EF 35   . Left ventricular aneurysm    Limited echo 7/19: Large apical septal aneurysm, severe hypokinesis of the entire apex and mid to apical segments of the anterior wall, anterolateral wall and anterior septum-consistent with infarct and most of the LAD territory; EF 35  . Myocardial infarction [facility]) 2003   Meds reviewed.    Current Outpatient Medications (Cardiovascular):  .  carvedilol (COREG) 3.125 MG tablet, Take 1 tablet (3.125 mg total) by mouth 2 (two) times daily with a meal. .  nitroGLYCERIN (NITROSTAT) 0.4 MG SL tablet, Place 0.4 mg under the tongue every 5 (five) minutes as needed for chest pain. For a max of 3 doses. If no relief, call 911. .  omega-3 acid ethyl esters (LOVAZA) 1 G capsule, Take 1 g by mouth daily. .  sacubitril-valsartan (ENTRESTO) 97-103 MG, Take 1 tablet by mouth 2 (two) times daily.    Current Outpatient Medications (Hematological):  .  clopidogrel (PLAVIX) 75 MG tablet, Take 1 tablet (75 mg total) by mouth daily. .  Cyanocobalamin (VITAMIN B-12) 5000 MCG LOZG,  Take 5,000 Units by mouth daily.  Current Outpatient Medications (Other):  .  dicyclomine (BENTYL) 20 MG tablet, Take 20 mg by mouth 2 (two) times daily after a meal.  .  LORazepam (ATIVAN) 0.5 MG tablet, Take 1 tablet (0.5 mg total) by mouth every 8 (eight) hours as needed. For anxiety (Patient taking differently: Take 0.5 mg by mouth every 8 (eight) hours as needed for anxiety. ) .  OVER THE COUNTER MEDICATION, Take 1 capsule by mouth 3 (three) times daily. Herbal supplement from Dr. Ardeth Perfect  - to strengthen intestinal lining - mixture of fruits and vegetables .  OVER THE COUNTER MEDICATION, Take 1 Scoop by mouth daily. Patriot Powder  Contains 20 fruits & vegetables, 10 probiotics and 6 digestive enzymes .  pantoprazole (PROTONIX) 40 MG tablet, Take 1 tablet (40 mg total) by mouth daily. .  polyethylene glycol (MIRALAX / GLYCOLAX) packet, Take 17 g by mouth at bedtime. Mix in 8 oz water and drink .  Probiotic Product (ALIGN) 4 MG CAPS, Take 4 mg by mouth daily.  .  vitamin C (ASCORBIC ACID) 500 MG tablet, Take 500 mg by mouth daily.  Marland Kitchen  zolpidem (AMBIEN) 10 MG tablet, Take 1 tablet (10 mg total) by mouth at bedtime as needed. For sleep (Patient taking differently: Take 5 mg by mouth at bedtime as needed for sleep. )   HT:  Ht Readings from Last 1 Encounters:  12/24/17 5\' 6"  (1.676 m)    WT: Wt Readings from Last 5 Encounters:  12/24/17 191 lb 5.8 oz (86.8 kg)  11/03/17 172 lb 3.2 oz (78.1 kg)  10/21/17 181 lb 6.4 oz (82.3 kg)  09/30/17 188 lb 12 oz (85.6 kg)  08/25/17 193 lb (87.5 kg)     Body mass index is 30.89 kg/m.   Current tobacco use? No  Labs:  Lipid Panel     Component Value Date/Time   CHOL 180 06/11/2012 0234   TRIG 367 (H) 06/11/2012 0234   HDL 34 (L) 06/11/2012 0234   CHOLHDL 5.3 06/11/2012 0234   VLDL 73 (H) 06/11/2012 0234   LDLCALC 73 06/11/2012 0234    Lab Results  Component Value Date   HGBA1C 6.0 (H) 01/08/2011   CBG (last 3)  No results for  input(s): GLUCAP in the last 72 hours.  Nutrition Note Spoke with pt. Nutrition plan and goals reviewed with pt. Pt is following Step 1 of the Therapeutic Lifestyle Changes diet. Pt shared he wants to eat healthy for his heart but not if food is going to taste bad. Will continue to utilize motivational interviewing and education to encourage pt to make healthy lifestyle and nutrition changes. Recommended heart healthy eating tips/ strategies with pt today (label reading, how to build a healthy plate, portion sizes, eating frequently across the day). Pt has Pre-diabetes. Last A1c indicates blood glucose elevated at 6.0. Reviewed the difference between refined and complex carbohydrates. Recommended pt replace refined carbohydrates with complex, and focus on keeping portions consistent across the day. Per discussion, pt does use canned/convenience foods often. Pt does not add salt to food. Pt does eat out frequently. Recommended pt decrease meals eaten away from home to help control saturated/trans fat and sodium amounts consumed. Pt expressed understanding of the information reviewed. Pt aware of nutrition education classes offered and would like to attend nutrition classes.  Nutrition Diagnosis ? Food-and nutrition-related knowledge deficit related to lack of exposure to information as related to diagnosis of: ? CVD ? Pre-diabetes ? Obese  I = 30-34.9 related to excessive energy intake as evidenced by a Body mass index is 30.89 kg/m. ? Excessive sodium intake related to over consumption of processed food as evidenced by frequent consumption of convenience food/ canned vegetables and eating out frequently.  Nutrition Intervention ? Pt's individual nutrition plan and goals reviewed with pt.  Nutrition Goal(s):  ? Pt to identify and limit food sources of saturated fat, trans fat, refined carbohydrates and sodium ? Pt to reduce frequency of meals eaten out ? Pt able to name foods that affect blood  glucose.   Plan:  ? Pt to attend nutrition classes ? Nutrition I ? Nutrition II ? Portion Distortion  ? Diabetes Blitz ? Diabetes Q & Ae determined ? Will provide client-centered nutrition education as part of interdisciplinary care ? Monitor and evaluate progress toward nutrition goal with team.   Laurina Bustle, MS, RD, LDN 12/24/2017 10:49 AM

## 2017-12-24 NOTE — Progress Notes (Signed)
Cardiac Individual Treatment Plan  Patient Details  Name: [name] MRN: [id number] Date of Birth: [birth year] Referring Provider:   Flowsheet Row CARDIAC REHAB PHASE II ORIENTATION from 12/24/2017 in [facility]  Referring Provider  Josue Hector MD       Initial Encounter Date:  [place] PHASE II ORIENTATION from 12/24/2017 in [facility]  Date  12/24/17      Visit Diagnosis: 10/20/2017 Status post coronary artery stent placement  Patient's Home Medications on Admission:  Current Outpatient Medications:  .  carvedilol (COREG) 3.125 MG tablet, Take 1 tablet (3.125 mg total) by mouth 2 (two) times daily with a meal., Disp: 180 tablet, Rfl: 3 .  clopidogrel (PLAVIX) 75 MG tablet, Take 1 tablet (75 mg total) by mouth daily., Disp: 30 tablet, Rfl: 3 .  Cyanocobalamin (VITAMIN B-12) 5000 MCG LOZG, Take 5,000 Units by mouth daily., Disp: , Rfl:  .  dicyclomine (BENTYL) 20 MG tablet, Take 20 mg by mouth 2 (two) times daily after a meal. , Disp: , Rfl:  .  LORazepam (ATIVAN) 0.5 MG tablet, Take 1 tablet (0.5 mg total) by mouth every 8 (eight) hours as needed. For anxiety (Patient taking differently: Take 0.5 mg by mouth every 8 (eight) hours as needed for anxiety. ), Disp: 60 tablet, Rfl: 5 .  nitroGLYCERIN (NITROSTAT) 0.4 MG SL tablet, Place 0.4 mg under the tongue every 5 (five) minutes as needed for chest pain. For a max of 3 doses. If no relief, call 911., Disp: , Rfl:  .  omega-3 acid ethyl esters (LOVAZA) 1 G capsule, Take 1 g by mouth daily., Disp: , Rfl:  .  OVER THE COUNTER MEDICATION, Take 1 capsule by mouth 3 (three) times daily. Herbal supplement from Dr. Ardeth Perfect  - to strengthen intestinal lining - mixture of fruits and vegetables, Disp: , Rfl:  .  OVER THE COUNTER MEDICATION, Take 1 Scoop by mouth daily. Patriot Powder  Contains 20 fruits & vegetables, 10 probiotics and 6 digestive enzymes, Disp:  , Rfl:  .  pantoprazole (PROTONIX) 40 MG tablet, Take 1 tablet (40 mg total) by mouth daily., Disp: 30 tablet, Rfl: 11 .  polyethylene glycol (MIRALAX / GLYCOLAX) packet, Take 17 g by mouth at bedtime. Mix in 8 oz water and drink, Disp: , Rfl:  .  Probiotic Product (ALIGN) 4 MG CAPS, Take 4 mg by mouth daily. , Disp: , Rfl:  .  sacubitril-valsartan (ENTRESTO) 97-103 MG, Take 1 tablet by mouth 2 (two) times daily., Disp: 60 tablet, Rfl: 11 .  vitamin C (ASCORBIC ACID) 500 MG tablet, Take 500 mg by mouth daily. , Disp: , Rfl:  .  zolpidem (AMBIEN) 10 MG tablet, Take 1 tablet (10 mg total) by mouth at bedtime as needed. For sleep (Patient taking differently: Take 5 mg by mouth at bedtime as needed for sleep. ), Disp: 30 tablet, Rfl: 5  Past Medical History: Past Medical History:  Diagnosis Date  . Anxiety   . Arthritis    "wrists" (07/27/2014)  . Coronary artery disease    a.  Pt with 4 stents over the course of 2003-2012;  b.  LHC 6/16:  LAD, RCA, LCx stents patent, OM1 50%, EF 50-55% >> med rx   . Elevated LFTs   . GERD (gastroesophageal reflux disease)   . Hypercholesterolemia   . Hypertension   . Ischemic cardiomyopathy    cMRI 03/2011:  EF 48% with  dist ant, septal, apical and inf-apical dyskinesia, no apical clot, dist ant, apical and septal full thickness scar >> No ICD needed  //  Limited echo 7/19: Large apical septal aneurysm, severe hypokinesis of the entire apex and mid to apical segments of the anterior wall, anterolateral wall and anterior septum-consistent with infarct and most of the LAD territory; EF 35   . Left ventricular aneurysm    Limited echo 7/19: Large apical septal aneurysm, severe hypokinesis of the entire apex and mid to apical segments of the anterior wall, anterolateral wall and anterior septum-consistent with infarct and most of the LAD territory; EF 35  . Myocardial infarction ([place]) 2003    Tobacco Use: Social History   Tobacco Use  Smoking Status Former  Smoker  . Packs/day: 1.00  . Years: 15.00  . Pack years: 15.00  . Types: Cigarettes  Smokeless Tobacco Never Used  Tobacco Comment   "quit smoking cigarettes in 1979"    Labs: Recent Review Flowsheet Data    Labs for ITP Cardiac and Pulmonary Rehab Latest Ref Rng & Units 06/02/2007 06/03/2007 12/18/2007 01/08/2011 06/11/2012   Cholestrol 0 - 200 mg/dL - 149 ATP III CLASSIFICATION: <200     mg/dL   Desirable 200-239  mg/dL   Borderline High >=240    mg/dL   High 200 ATP III CLASSIFICATION: <200     mg/dL   Desirable 200-239  mg/dL   Borderline High >=240    mg/dL   High 167 180   LDLCALC 0 - 99 mg/dL - 97 Total Cholesterol/HDL:CHD Risk Coronary Heart Disease Risk Table Men   Women 1/2 Average Risk   3.4   3.3 118 Total Cholesterol/HDL:CHD Risk Coronary Heart Disease Risk Table Men   Women 1/2 Average Risk   3.4   3.3(H) 77 73   HDL >39 mg/dL - 31(L) 34(L) 30(L) 34(L)   Trlycerides <150 mg/dL - 106 238(H) 299(H) 367(H)   Hemoglobin A1c <5.7 % - 5.8 (NOTE)   The ADA recommends the following therapeutic goals for glycemic   control related to Hgb A1C measurement:   Goal of Therapy:   < 7.0% Hgb A1C   Action Suggested:  > 8.0% Hgb A1C   Ref:  Diabetes Care, 22, Suppl. 1, 1999 - 6.0(H) -   TCO2 - 24 - - - -      Capillary Blood Glucose: No results found for: GLUCAP   Exercise Target Goals: Exercise Program Goal: Individual exercise prescription set using results from initial 6 min walk test and THRR while considering  patient's activity barriers and safety.   Exercise Prescription Goal: Initial exercise prescription builds to 30-45 minutes a day of aerobic activity, 2-3 days per week.  Home exercise guidelines will be given to patient during program as part of exercise prescription that the participant will acknowledge.  Activity Barriers & Risk Stratification: Activity Barriers & Cardiac Risk Stratification - 12/24/17 0957    Activity Barriers & Cardiac Risk Stratification           Activity Barriers  Deconditioning    Cardiac Risk Stratification  Moderate           6 Minute Walk: 6 Minute Walk    6 Minute Walk    Row Name 12/24/17 0952   Phase  Initial   Distance  1572 feet   Walk Time  6 minutes   # of Rest Breaks  0   MPH  2.98   METS  3.03   RPE  11   Perceived Dyspnea   0   VO2 Peak  10.6   Symptoms  No   Resting HR  79 bpm   Resting BP  122/64   Resting Oxygen Saturation   97 %   Exercise Oxygen Saturation  during 6 min walk  98 %   Max Ex. HR  101 bpm   Max Ex. BP  126/64   2 Minute Post BP  122/70          Oxygen Initial Assessment:   Oxygen Re-Evaluation:   Oxygen Discharge (Final Oxygen Re-Evaluation):   Initial Exercise Prescription: Initial Exercise Prescription - 12/24/17 1000    Date of Initial Exercise RX and Referring Provider          Date  12/24/17    Referring Provider  Josue Hector MD     Expected Discharge Date  04/02/17        Treadmill          MPH  2.3    Grade  1    Minutes  10    METs  2.99        Bike          Level  0.7    Minutes  10    METs  2.55        NuStep          Level  2    SPM  75    Minutes  10    METs  2.8        Prescription Details          Frequency (times per week)  3x    Duration  Progress to 30 minutes of continuous aerobic without signs/symptoms of physical distress        Intensity          THRR 40-80% of Max Heartrate  59-118    Ratings of Perceived Exertion  11-13    Perceived Dyspnea  0-4        Progression          Progression  Continue progressive overload as per policy without signs/symptoms or physical distress.        Resistance Training          Training Prescription  Yes    Weight  3lbs    Reps  10-15           Perform Capillary Blood Glucose checks as needed.  Exercise Prescription Changes:   Exercise Comments:   Exercise Goals and Review: Exercise Goals    Exercise Goals    Row Name 12/24/17 0956   Increase  Physical Activity  Yes   Intervention  Provide advice, education, support and counseling about physical activity/exercise needs.;Develop an individualized exercise prescription for aerobic and resistive training based on initial evaluation findings, risk stratification, comorbidities and participant's personal goals.   Expected Outcomes  Short Term: Attend rehab on a regular basis to increase amount of physical activity.;Long Term: Add in home exercise to make exercise part of routine and to increase amount of physical activity.;Long Term: Exercising regularly at least 3-5 days a week.   Increase Strength and Stamina  Yes   Intervention  Develop an individualized exercise prescription for aerobic and resistive training based on initial evaluation findings, risk stratification, comorbidities and participant's personal goals.;Provide advice, education, support and counseling about physical activity/exercise needs.   Expected Outcomes  Short Term: Increase workloads from initial exercise prescription for resistance, speed, and  METs.;Short Term: Perform resistance training exercises routinely during rehab and add in resistance training at home;Long Term: Improve cardiorespiratory fitness, muscular endurance and strength as measured by increased METs and functional capacity (6MWT)   Able to understand and use rate of perceived exertion (RPE) scale  Yes   Intervention  Provide education and explanation on how to use RPE scale   Expected Outcomes  Short Term: Able to use RPE daily in rehab to express subjective intensity level;Long Term:  Able to use RPE to guide intensity level when exercising independently   Knowledge and understanding of Target Heart Rate Range (THRR)  Yes   Intervention  Provide education and explanation of THRR including how the numbers were predicted and where they are located for reference   Expected Outcomes  Short Term: Able to state/look up THRR;Long Term: Able to use THRR to govern  intensity when exercising independently;Short Term: Able to use daily as guideline for intensity in rehab   Understanding of Exercise Prescription  Yes   Intervention  Provide education, explanation, and written materials on patient's individual exercise prescription   Expected Outcomes  Short Term: Able to explain program exercise prescription;Long Term: Able to explain home exercise prescription to exercise independently          Exercise Goals Re-Evaluation :   Discharge Exercise Prescription (Final Exercise Prescription Changes):   Nutrition:  Target Goals: Understanding of nutrition guidelines, daily intake of sodium 1500mg , cholesterol 200mg , calories 30% from fat and 7% or less from saturated fats, daily to have 5 or more servings of fruits and vegetables.  Biometrics: Pre Biometrics - 12/24/17 0954    Pre Biometrics          Height  5\' 6"  (1.676 m)    Weight  86.8 kg    Waist Circumference  43 inches    Hip Circumference  41.5 inches    Waist to Hip Ratio  1.04 %    BMI (Calculated)  30.9    Triceps Skinfold  21 mm    % Body Fat  31.9 %    Grip Strength  39 kg    Flexibility  0 in    Single Leg Stand  2.69 seconds            Nutrition Therapy Plan and Nutrition Goals:   Nutrition Assessments:   Nutrition Goals Re-Evaluation:   Nutrition Goals Re-Evaluation:   Nutrition Goals Discharge (Final Nutrition Goals Re-Evaluation):   Psychosocial: Target Goals: Acknowledge presence or absence of significant depression and/or stress, maximize coping skills, provide positive support system. Participant is able to verbalize types and ability to use techniques and skills needed for reducing stress and depression.  Initial Review & Psychosocial Screening: Initial Psych Review & Screening - 12/24/17 1050    Initial Review          Current issues with  None Identified        Family Dynamics          Good Support System?  Yes   family, friends         Barriers          Psychosocial barriers to participate in program  There are no identifiable barriers or psychosocial needs.        Screening Interventions          Interventions  Encouraged to exercise           Quality of Life Scores: Quality of Life - 12/24/17 1004  Quality of Life          Select  Quality of Life        Quality of Life Scores          Health/Function Pre  19.87 %    Socioeconomic Pre  20.5 %    Psych/Spiritual Pre  20.36 %    Family Pre  26.4 %    GLOBAL Pre  21.06 %          Scores of 19 and below usually indicate a poorer quality of life in these areas.  A difference of  2-3 points is a clinically meaningful difference.  A difference of 2-3 points in the total score of the Quality of Life Index has been associated with significant improvement in overall quality of life, self-image, physical symptoms, and general health in studies assessing change in quality of life.  PHQ-9: Recent Review Flowsheet Data    There is no flowsheet data to display.     Interpretation of Total Score  Total Score Depression Severity:  1-4 = Minimal depression, 5-9 = Mild depression, 10-14 = Moderate depression, 15-19 = Moderately severe depression, 20-27 = Severe depression   Psychosocial Evaluation and Intervention:   Psychosocial Re-Evaluation:   Psychosocial Discharge (Final Psychosocial Re-Evaluation):   Vocational Rehabilitation: Provide vocational rehab assistance to qualifying candidates.   Vocational Rehab Evaluation & Intervention: Vocational Rehab - 12/24/17 1050    Initial Vocational Rehab Evaluation & Intervention          Assessment shows need for Vocational Rehabilitation  No   retired Clinical biochemist          Education: Education Goals: Education classes will be provided on a weekly basis, covering required topics. Participant will state understanding/return demonstration of topics presented.  Learning Barriers/Preferences: Learning  Barriers/Preferences - 12/24/17 1004    Learning Barriers/Preferences          Learning Barriers  Sight    Learning Preferences  Skilled Demonstration;Verbal Instruction;Written Material           Education Topics: Count Your Pulse:  -Group instruction provided by verbal instruction, demonstration, patient participation and written materials to support subject.  Instructors address importance of being able to find your pulse and how to count your pulse when at home without a heart monitor.  Patients get hands on experience counting their pulse with staff help and individually.   Heart Attack, Angina, and Risk Factor Modification:  -Group instruction provided by verbal instruction, video, and written materials to support subject.  Instructors address signs and symptoms of angina and heart attacks.    Also discuss risk factors for heart disease and how to make changes to improve heart health risk factors.   Functional Fitness:  -Group instruction provided by verbal instruction, demonstration, patient participation, and written materials to support subject.  Instructors address safety measures for doing things around the house.  Discuss how to get up and down off the floor, how to pick things up properly, how to safely get out of a chair without assistance, and balance training.   Meditation and Mindfulness:  -Group instruction provided by verbal instruction, patient participation, and written materials to support subject.  Instructor addresses importance of mindfulness and meditation practice to help reduce stress and improve awareness.  Instructor also leads participants through a meditation exercise.    Stretching for Flexibility and Mobility:  -Group instruction provided by verbal instruction, patient participation, and written materials to support subject.  Instructors lead participants through series  of stretches that are designed to increase flexibility thus improving mobility.  These  stretches are additional exercise for major muscle groups that are typically performed during regular warm up and cool down.   Hands Only CPR:  -Group verbal, video, and participation provides a basic overview of AHA guidelines for community CPR. Role-play of emergencies allow participants the opportunity to practice calling for help and chest compression technique with discussion of AED use.   Hypertension: -Group verbal and written instruction that provides a basic overview of hypertension including the most recent diagnostic guidelines, risk factor reduction with self-care instructions and medication management.    Nutrition I class: Heart Healthy Eating:  -Group instruction provided by PowerPoint slides, verbal discussion, and written materials to support subject matter. The instructor gives an explanation and review of the Therapeutic Lifestyle Changes diet recommendations, which includes a discussion on lipid goals, dietary fat, sodium, fiber, plant stanol/sterol esters, sugar, and the components of a well-balanced, healthy diet.   Nutrition II class: Lifestyle Skills:  -Group instruction provided by PowerPoint slides, verbal discussion, and written materials to support subject matter. The instructor gives an explanation and review of label reading, grocery shopping for heart health, heart healthy recipe modifications, and ways to make healthier choices when eating out.   Diabetes Question & Answer:  -Group instruction provided by PowerPoint slides, verbal discussion, and written materials to support subject matter. The instructor gives an explanation and review of diabetes co-morbidities, pre- and post-prandial blood glucose goals, pre-exercise blood glucose goals, signs, symptoms, and treatment of hypoglycemia and hyperglycemia, and foot care basics.   Diabetes Blitz:  -Group instruction provided by PowerPoint slides, verbal discussion, and written materials to support subject  matter. The instructor gives an explanation and review of the physiology behind type 1 and type 2 diabetes, diabetes medications and rational behind using different medications, pre- and post-prandial blood glucose recommendations and Hemoglobin A1c goals, diabetes diet, and exercise including blood glucose guidelines for exercising safely.    Portion Distortion:  -Group instruction provided by PowerPoint slides, verbal discussion, written materials, and food models to support subject matter. The instructor gives an explanation of serving size versus portion size, changes in portions sizes over the last 20 years, and what consists of a serving from each food group.   Stress Management:  -Group instruction provided by verbal instruction, video, and written materials to support subject matter.  Instructors review role of stress in heart disease and how to cope with stress positively.     Exercising on Your Own:  -Group instruction provided by verbal instruction, power point, and written materials to support subject.  Instructors discuss benefits of exercise, components of exercise, frequency and intensity of exercise, and end points for exercise.  Also discuss use of nitroglycerin and activating EMS.  Review options of places to exercise outside of rehab.  Review guidelines for sex with heart disease.   Cardiac Drugs I:  -Group instruction provided by verbal instruction and written materials to support subject.  Instructor reviews cardiac drug classes: antiplatelets, anticoagulants, beta blockers, and statins.  Instructor discusses reasons, side effects, and lifestyle considerations for each drug class.   Cardiac Drugs II:  -Group instruction provided by verbal instruction and written materials to support subject.  Instructor reviews cardiac drug classes: angiotensin converting enzyme inhibitors (ACE-I), angiotensin II receptor blockers (ARBs), nitrates, and calcium channel blockers.  Instructor  discusses reasons, side effects, and lifestyle considerations for each drug class.   Anatomy and Physiology of the Circulatory  System:  Group verbal and written instruction and models provide basic cardiac anatomy and physiology, with the coronary electrical and arterial systems. Review of: AMI, Angina, Valve disease, Heart Failure, Peripheral Artery Disease, Cardiac Arrhythmia, Pacemakers, and the ICD.   Other Education:  -Group or individual verbal, written, or video instructions that support the educational goals of the cardiac rehab program.   Holiday Eating Survival Tips:  -Group instruction provided by PowerPoint slides, verbal discussion, and written materials to support subject matter. The instructor gives patients tips, tricks, and techniques to help them not only survive but enjoy the holidays despite the onslaught of food that accompanies the holidays.   Knowledge Questionnaire Score: Knowledge Questionnaire Score - 12/24/17 1004    Knowledge Questionnaire Score          Pre Score  20/24           Core Components/Risk Factors/Patient Goals at Admission: Personal Goals and Risk Factors at Admission - 12/24/17 1005    Core Components/Risk Factors/Patient Goals on Admission           Weight Management  Yes;Weight Loss    Intervention  Weight Management: Develop a combined nutrition and exercise program designed to reach desired caloric intake, while maintaining appropriate intake of nutrient and fiber, sodium and fats, and appropriate energy expenditure required for the weight goal.;Weight Management: Provide education and appropriate resources to help participant work on and attain dietary goals.;Weight Management/Obesity: Establish reasonable short term and long term weight goals.;Obesity: Provide education and appropriate resources to help participant work on and attain dietary goals.    Admit Weight  191 lb 5.8 oz (86.8 kg)    Goal Weight: Long Term  180 lb (81.6 kg)     Expected Outcomes  Short Term: Continue to assess and modify interventions until short term weight is achieved;Long Term: Adherence to nutrition and physical activity/exercise program aimed toward attainment of established weight goal;Weight Loss: Understanding of general recommendations for a balanced deficit meal plan, which promotes 1-2 lb weight loss per week and includes a negative energy balance of [phone] kcal/d;Understanding recommendations for meals to include 15-35% energy as protein, 25-35% energy from fat, 35-60% energy from carbohydrates, less than 200mg  of dietary cholesterol, 20-35 gm of total fiber daily;Understanding of distribution of calorie intake throughout the day with the consumption of 4-5 meals/snacks    Hypertension  Yes    Intervention  Monitor prescription use compliance.;Provide education on lifestyle modifcations including regular physical activity/exercise, weight management, moderate sodium restriction and increased consumption of fresh fruit, vegetables, and low fat dairy, alcohol moderation, and smoking cessation.    Expected Outcomes  Short Term: Continued assessment and intervention until BP is < 140/46mm HG in hypertensive participants. < 130/33mm HG in hypertensive participants with diabetes, heart failure or chronic kidney disease.;Long Term: Maintenance of blood pressure at goal levels.    Lipids  Yes    Intervention  Provide education and support for participant on nutrition & aerobic/resistive exercise along with prescribed medications to achieve LDL 70mg , HDL >40mg .    Expected Outcomes  Short Term: Participant states understanding of desired cholesterol values and is compliant with medications prescribed. Participant is following exercise prescription and nutrition guidelines.;Long Term: Cholesterol controlled with medications as prescribed, with individualized exercise RX and with personalized nutrition plan. Value goals: LDL < 70mg , HDL > 40 mg.            Core Components/Risk Factors/Patient Goals Review:    Core Components/Risk Factors/Patient Goals at Discharge (Final  Review):    ITP Comments: ITP Comments    Row Name 12/24/17 0926   ITP Comments  Dr. Fransico Him, Medical Director       Comments: Patient attended orientation from 0809 to [phone]  to review rules and guidelines for program. Completed 6 minute walk test, Intitial ITP, and exercise prescription.  VSS. Telemetry-sinus rhythm,  Asymptomatic.  Andi Hence, RN, BSN Cardiac Pulmonary Rehab 12/24/17 10:55 AM

## 2017-12-25 ENCOUNTER — Ambulatory Visit (HOSPITAL_COMMUNITY)
Admission: RE | Admit: 2017-12-25 | Discharge: 2017-12-25 | Disposition: A | Discharge status: Home / Self Care | Payer: Medicare HMO | Source: Ambulatory Visit | Attending: Cardiovascular Disease | Admitting: Cardiovascular Disease

## 2017-12-25 DIAGNOSIS — I5023 Acute on chronic systolic (congestive) heart failure: Secondary | ICD-10-CM

## 2017-12-25 LAB — CREATININE, SERUM
Creatinine, Ser: 1.27 mg/dL — ABNORMAL HIGH (ref 0.61–1.24)
GFR calc Af Amer: >60 mL/min (ref >60)
GFR, EST NON AFRICAN AMERICAN: 55 mL/min — AB (ref >60)

## 2017-12-25 MED ORDER — GADOBUTROL 1 MMOL/ML IV SOLN
9.0000 mL | Freq: Once | INTRAVENOUS | Status: AC | PRN
  Administered 2017-12-25: 9 mL via INTRAVENOUS

## 2017-12-28 ENCOUNTER — Encounter (HOSPITAL_COMMUNITY): Payer: Medicare HMO

## 2017-12-28 ENCOUNTER — Encounter (HOSPITAL_COMMUNITY): Payer: Self-pay

## 2017-12-28 ENCOUNTER — Encounter (HOSPITAL_COMMUNITY)
Admission: RE | Admit: 2017-12-28 | Discharge: 2017-12-28 | Disposition: A | Discharge status: Home / Self Care | Payer: Medicare HMO | Source: Ambulatory Visit | Attending: Cardiovascular Disease | Admitting: Cardiovascular Disease

## 2017-12-28 DIAGNOSIS — I255 Ischemic cardiomyopathy: Secondary | ICD-10-CM | POA: Diagnosis not present

## 2017-12-28 DIAGNOSIS — M1990 Unspecified osteoarthritis, unspecified site: Secondary | ICD-10-CM | POA: Diagnosis not present

## 2017-12-28 DIAGNOSIS — E7800 Pure hypercholesterolemia, unspecified: Secondary | ICD-10-CM | POA: Insufficient documentation

## 2017-12-28 DIAGNOSIS — I2510 Atherosclerotic heart disease of native coronary artery without angina pectoris: Secondary | ICD-10-CM | POA: Insufficient documentation

## 2017-12-28 DIAGNOSIS — I252 Old myocardial infarction: Secondary | ICD-10-CM | POA: Diagnosis not present

## 2017-12-28 DIAGNOSIS — Z955 Presence of coronary angioplasty implant and graft: Secondary | ICD-10-CM | POA: Diagnosis not present

## 2017-12-28 DIAGNOSIS — F419 Anxiety disorder, unspecified: Secondary | ICD-10-CM | POA: Diagnosis not present

## 2017-12-28 DIAGNOSIS — K219 Gastro-esophageal reflux disease without esophagitis: Secondary | ICD-10-CM | POA: Insufficient documentation

## 2017-12-28 DIAGNOSIS — Z79899 Other long term (current) drug therapy: Secondary | ICD-10-CM | POA: Diagnosis not present

## 2017-12-28 DIAGNOSIS — Z87891 Personal history of nicotine dependence: Secondary | ICD-10-CM | POA: Insufficient documentation

## 2017-12-28 DIAGNOSIS — Z7902 Long term (current) use of antithrombotics/antiplatelets: Secondary | ICD-10-CM | POA: Diagnosis not present

## 2017-12-28 DIAGNOSIS — I10 Essential (primary) hypertension: Secondary | ICD-10-CM | POA: Insufficient documentation

## 2017-12-28 NOTE — Progress Notes (Signed)
Daily Session Note  Patient Details  Name: [name] MRN: [id number] Date of Birth: 17-May-1945 Referring Provider:   Flowsheet Row CARDIAC REHAB PHASE II ORIENTATION from 12/24/2017 in [place]  Referring Provider  Josue Hector MD       Encounter Date: 12/28/2017  Check In: Session Check In - 12/28/17 1504    Check-In          Supervising physician immediately available to respond to emergencies  [facility] immediately available    Physician(s)  Dr. Algis Liming    Location  [facility]    Staff Present  Jiles Garter, RN, BSN;Brittany Durene Fruits, BS, ACSM CEP, Exercise Physiologist;Joann Rion, RN, BSN    Medication changes reported      No    Fall or balance concerns reported     No    Tobacco Cessation  No Change    Warm-up and Cool-down  Performed as group-led instruction    Resistance Training Performed  Yes    VAD Patient?  No    PAD/SET Patient?  No        Pain Assessment          Currently in Pain?  No/denies           Capillary Blood Glucose: No results found for this or any previous visit (from the past 24 hour(s)).    Social History   Tobacco Use  Smoking Status Former Smoker  . Packs/day: 1.00  . Years: 15.00  . Pack years: 15.00  . Types: Cigarettes  Smokeless Tobacco Never Used  Tobacco Comment   "quit smoking cigarettes in 1979"    Goals Met:  Exercise tolerated well  Goals Unmet:  Not Applicable  Comments: Pt started cardiac rehab today.  Pt tolerated light exercise without difficulty. VSS, telemetry-sinus rhythm,  asymptomatic.  Medication list reconciled. Pt denies barriers to medicaiton compliance.  PSYCHOSOCIAL ASSESSMENT:  PHQ-0. Pt exhibits positive coping skills, hopeful outlook with supportive family. No psychosocial needs identified at this time, no psychosocial interventions necessary.    Pt oriented to exercise equipment and routine.    Understanding verbalized.  Andi Hence,  RN, BSN Cardiac Pulmonary Rehab    Dr. Fransico Him is Medical Director for Cardiac Rehab at [facility].

## 2017-12-30 ENCOUNTER — Encounter (HOSPITAL_COMMUNITY): Payer: Medicare HMO

## 2017-12-30 ENCOUNTER — Encounter (HOSPITAL_COMMUNITY)
Admission: RE | Admit: 2017-12-30 | Discharge: 2017-12-30 | Disposition: A | Discharge status: Home / Self Care | Payer: Medicare HMO | Source: Ambulatory Visit | Attending: Cardiovascular Disease | Admitting: Cardiovascular Disease

## 2017-12-30 DIAGNOSIS — M1990 Unspecified osteoarthritis, unspecified site: Secondary | ICD-10-CM | POA: Diagnosis not present

## 2017-12-30 DIAGNOSIS — Z955 Presence of coronary angioplasty implant and graft: Secondary | ICD-10-CM

## 2017-12-30 DIAGNOSIS — I10 Essential (primary) hypertension: Secondary | ICD-10-CM | POA: Diagnosis not present

## 2017-12-30 DIAGNOSIS — Z7902 Long term (current) use of antithrombotics/antiplatelets: Secondary | ICD-10-CM | POA: Diagnosis not present

## 2017-12-30 DIAGNOSIS — Z79899 Other long term (current) drug therapy: Secondary | ICD-10-CM | POA: Diagnosis not present

## 2017-12-30 DIAGNOSIS — K219 Gastro-esophageal reflux disease without esophagitis: Secondary | ICD-10-CM | POA: Diagnosis not present

## 2017-12-30 DIAGNOSIS — F419 Anxiety disorder, unspecified: Secondary | ICD-10-CM | POA: Diagnosis not present

## 2017-12-30 DIAGNOSIS — I2510 Atherosclerotic heart disease of native coronary artery without angina pectoris: Secondary | ICD-10-CM | POA: Diagnosis not present

## 2017-12-30 DIAGNOSIS — E7800 Pure hypercholesterolemia, unspecified: Secondary | ICD-10-CM | POA: Diagnosis not present

## 2017-12-30 NOTE — Progress Notes (Signed)
Cardiology Office Note    Date:  01/07/2018   ID:  [name], DOB [birth year], MRN [id number]  PCP:  Velna Hatchet, MD  Cardiologist: Jenkins Rouge, MD EPS: None  No chief complaint on file.   History of Present Illness:   72 y.o. f/u for advanced CAD and ischemic DCM.  Anterior MI with DES to LAD and circumflex in 2012 [place]. 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 Unstable angina August 2019 cath with Dr Burt Knack. 10/20/17 DES to mid LAD for instent  Restenosis. Other arteries ok except small OM1 not suitable for intervention. TTE with EF 35% F/U MRI 12/25/17 to risk stratify For AICD EF 46% full thickness scar involving distal septum, anterior wall apex and inferior apex. History of GI bleed March 2019 with duodenal erosions that were clipped. Intolerant to ASA. Also history of CKD, HLD, HTN  No angina compliant with meds no recurrent GI bleeding No signs of volume overload or CHF Functional class one    Past Medical History:  Diagnosis Date  . Anxiety   . Arthritis    "wrists" (07/27/2014)  . Coronary artery disease    a.  Pt with 4 stents over the course of 2003-2012;  b.  LHC 6/16:  LAD, RCA, LCx stents patent, OM1 50%, EF 50-55% >> med rx   . Elevated LFTs   . GERD (gastroesophageal reflux disease)   . Hypercholesterolemia   . Hypertension   . Ischemic cardiomyopathy    cMRI 03/2011:  EF 48% with dist ant, septal, apical and inf-apical dyskinesia, no apical clot, dist ant, apical and septal full thickness scar >> No ICD needed  //  Limited echo 7/19: Large apical septal aneurysm, severe hypokinesis of the entire apex and mid to apical segments of the anterior wall, anterolateral wall and anterior septum-consistent with infarct and most of the LAD territory; EF 35   . Left ventricular aneurysm    Limited echo 7/19: Large apical septal aneurysm, severe hypokinesis of the entire apex and mid to apical segments of the anterior wall, anterolateral wall and anterior  septum-consistent with infarct and most of the LAD territory; EF 35  . Myocardial infarction [facility]) 2003    Past Surgical History:  Procedure Laterality Date  . CARDIAC CATHETERIZATION  06/11/2012   Nonobstructive CAD, patent stents, EF 50%, mild apical dyskinesis  . CARDIAC CATHETERIZATION N/A 07/28/2014   Procedure: Left Heart Cath and Coronary Angiography;  Surgeon: Troy Sine, MD;  Location: [place] [facility];  Service: Cardiovascular;  Laterality: N/A;  . CORONARY ANGIOPLASTY WITH STENT PLACEMENT  2003-2012   total of 4 stents place  . CORONARY STENT INTERVENTION N/A 10/20/2017   Procedure: CORONARY STENT INTERVENTION;  Surgeon: Sherren Mocha, MD;  Location: [place] [facility];  Service: Cardiovascular;  Laterality: N/A;  . COSMETIC SURGERY Left 1970's   "dog ripped of my ear"  . LEFT HEART CATH AND CORONARY ANGIOGRAPHY N/A 10/20/2017   Procedure: LEFT HEART CATH AND CORONARY ANGIOGRAPHY;  Surgeon: Sherren Mocha, MD;  Location: [place] [facility];  Service: Cardiovascular;  Laterality: N/A;  . LEFT HEART CATHETERIZATION WITH CORONARY ANGIOGRAM N/A 06/11/2012   Procedure: LEFT HEART CATHETERIZATION WITH CORONARY ANGIOGRAM;  Surgeon: Hines Kloss M Martinique, MD;  Location: [facility];  Service: Cardiovascular;  Laterality: N/A;    Current Medications: Current Meds  Medication Sig  . carvedilol (COREG) 3.125 MG tablet Take 1 tablet (3.125 mg total) by mouth 2 (two) times daily with a meal.  .  clopidogrel (PLAVIX) 75 MG tablet Take 1 tablet (75 mg total) by mouth daily.  . Cyanocobalamin (VITAMIN B-12) 5000 MCG LOZG Take 5,000 Units by mouth daily.  Marland Kitchen dicyclomine (BENTYL) 20 MG tablet Take 20 mg by mouth 2 (two) times daily after a meal.   . LORazepam (ATIVAN) 0.5 MG tablet Take 1 tablet (0.5 mg total) by mouth every 8 (eight) hours as needed. For anxiety  . nitroGLYCERIN (NITROSTAT) 0.4 MG SL tablet Place 0.4 mg under the tongue every 5 (five) minutes as needed for chest pain. For a max  of 3 doses. If no relief, call 911.  . omega-3 acid ethyl esters (LOVAZA) 1 G capsule Take 1 g by mouth daily.  Marland Kitchen OVER THE COUNTER MEDICATION Take 1 capsule by mouth 3 (three) times daily. Herbal supplement from Dr. Ardeth Perfect  - to strengthen intestinal lining - mixture of fruits and vegetables  . OVER THE COUNTER MEDICATION Take 1 Scoop by mouth daily. Patriot Powder  Contains 20 fruits & vegetables, 10 probiotics and 6 digestive enzymes  . pantoprazole (PROTONIX) 40 MG tablet Take 1 tablet (40 mg total) by mouth daily.  . polyethylene glycol (MIRALAX / GLYCOLAX) packet Take 17 g by mouth at bedtime. Mix in 8 oz water and drink  . Probiotic Product (ALIGN) 4 MG CAPS Take 4 mg by mouth daily.   . sacubitril-valsartan (ENTRESTO) 97-103 MG Take 1 tablet by mouth 2 (two) times daily.  . vitamin C (ASCORBIC ACID) 500 MG tablet Take 500 mg by mouth daily.   Marland Kitchen zolpidem (AMBIEN) 10 MG tablet Take 1 tablet (10 mg total) by mouth at bedtime as needed. For sleep (Patient taking differently: Take 5 mg by mouth at bedtime as needed for sleep. )     Allergies:   Asa [aspirin]; Penicillins; and Statins   Social History   Socioeconomic History  . Marital status: Divorced    Spouse name: Not on file  . Number of children: Not on file  . Years of education: Not on file  . Highest education level: Not on file  Occupational History  . Not on file  Social Needs  . Financial resource strain: Not on file  . Food insecurity:    Worry: Not on file    Inability: Not on file  . Transportation needs:    Medical: Not on file    Non-medical: Not on file  Tobacco Use  . Smoking status: Former Smoker    Packs/day: 1.00    Years: 15.00    Pack years: 15.00    Types: Cigarettes  . Smokeless tobacco: Never Used  . Tobacco comment: "quit smoking cigarettes in 1979"  Substance and Sexual Activity  . Alcohol use: Yes    Alcohol/week: 2.0 standard drinks    Types: 2 Glasses of wine per week  . Drug use: No  .  Sexual activity: Not Currently  Lifestyle  . Physical activity:    Days per week: Not on file    Minutes per session: Not on file  . Stress: Not on file  Relationships  . Social connections:    Talks on phone: Not on file    Gets together: Not on file    Attends religious service: Not on file    Active member of club or organization: Not on file    Attends meetings of clubs or organizations: Not on file    Relationship status: Not on file  Other Topics Concern  . Not on file  Social History Narrative  . Not on file     Family History:  The patient's family history includes Coronary artery disease in his father and mother.   ROS:   Please see the history of present illness.    Review of Systems  Constitution: Negative.  HENT: Negative.   Cardiovascular: Negative.   Respiratory: Negative.   Endocrine: Negative.   Hematologic/Lymphatic: Negative.   Musculoskeletal: Negative.   Gastrointestinal: Negative.   Genitourinary: Negative.   Neurological: Negative.    All other systems reviewed and are negative.   PHYSICAL EXAM:   VS:  BP 128/72   Pulse 79   Ht 5\' 6"  (1.676 m)   Wt 190 lb 9.6 oz (86.5 kg)   BMI 30.76 kg/m   Physical Exam  GEN: Well nourished, well developed, in no acute distress  Affect appropriate Healthy:  appears stated age 12: normal Neck supple with no adenopathy JVP normal no bruits no thyromegaly Lungs clear with no wheezing and good diaphragmatic motion Heart:  S1/S2 no murmur, no rub, gallop or click PMI enlarged  Abdomen: benighn, BS positve, no tenderness, no AAA no bruit.  No HSM or HJR Distal pulses intact with no bruits No edema Neuro non-focal Skin warm and dry No muscular weakness   Wt Readings from Last 3 Encounters:  01/07/18 190 lb 9.6 oz (86.5 kg)  12/24/17 191 lb 5.8 oz (86.8 kg)  11/03/17 172 lb 3.2 oz (78.1 kg)      Studies/Labs Reviewed:   EKG:  EKG is not ordered today.   Recent Labs: 10/21/2017: BUN 12;  Hemoglobin 14.2; Platelets 228; Potassium 4.0; Sodium 138 10/30/2017: NT-Pro BNP 37 12/25/2017: Creatinine, Ser 1.27   Lipid Panel    Component Value Date/Time   CHOL 180 06/11/2012 0234   TRIG 367 (H) 06/11/2012 0234   HDL 34 (L) 06/11/2012 0234   CHOLHDL 5.3 06/11/2012 0234   VLDL 73 (H) 06/11/2012 0234   LDLCALC 73 06/11/2012 0234    Additional studies/ records that were reviewed today include:   Cardiac catheterization 8/27/2019Ost 1st Mrg to 1st Mrg lesion is 75% stenosed.  Non-stenotic Ost RCA to Mid RCA lesion was previously treated.  Mid RCA to Dist RCA lesion is 40% stenosed.  Prox Cx to Mid Cx lesion is 30% stenosed.  Prox LAD to Mid LAD lesion is 80% stenosed.  A drug-eluting stent was successfully placed using a STENT RESOLUTE ONYX 2.5X38.  Post intervention, there is a 0% residual stenosis.   1.  Multivessel coronary artery disease with continued patency of the stented segment in the RCA, continued patency of the stented segment in the left circumflex with mild in-stent restenosis and severe stenosis of a small obtuse marginal branch not suitable for PCI, and severe diffuse stenosis of the proximal and mid LAD treated successfully with overlapping resolute Onyx drug-eluting stents 2.  Known severe LV systolic dysfunction   Recommendation: The patient should continue on clopidogrel alone.  He is intolerant to aspirin.  I would favor long-term clopidogrel since he is intolerant to aspirin and has been treated with multiple stents.   Recommend dual antiplatelet therapy with Clopidogrel 75mg  daily long-term (beyond 12 months) because of multiple stents, severe LV dysfunction, ASA intolerance. Echo 09/01/17 Impressions:   - Limited Definity enhanced study for LV wall motion and thrombus.   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   There is severe hypokinesis in the entire apex and the mid-apical   segments of the anterior wall, anterolateral  wall and anterior   septum.    This is consistent with infarction in most of the LAD artery   territory.   Overall LV ejection fraction is approximately 35%.     Left Heart Cath and Coronary Angiography  07/2014  Conclusion     Ost 1st Mrg to 1st Mrg lesion, 50% stenosed.  Ost LAD to Dist LAD lesion, 20% stenosed.   Low normal LV function with an ejection fraction of 50-55% with evidence for an akinetic/dyskinetic aneurysmal apex.   Coronary artery  disease with patent tandem proximal to mid LAD stents with smooth 20% in-stent narrowing in the distal aspect of the stent; patent  left circumflex stent with evidence for 50-60% proximal narrowing in a small marginal branch which arises with aupper takeoff from this stented segment; and widely patent RCA stent in a dominant RCA system.   RECOMMENDATION:   Medical therapy.       ASSESSMENT:    No diagnosis found.   PLAN:  In order of problems listed above:  CAD status post MIs in 2012 in [place] treated with DES to the LAD circumflex and RCA.  August 2019 with unstable angina treated with overlapping DES to the LAD 10/20/2017.  He is aspirin intolerant so plan for long-term clopidogrel.  No angina.    Ischemic cardiomyopathy ejection fraction 35% on echo 08/2017.  Started on Entresto MRI with EF 46% 12/25/17 continue medical Rx  HTN:  Well controlled.  Continue current medications and low sodium Dash type diet. Tends toward bradycardia so beta blocker not increased    CKD stage III creatinine was 1.27  12/25/17 prior to MRI   Hyperlipidemia LDL was 127 triglycerides 205 08/17/2017 on lovaza intolerant to statin refer to lipid clinic for PSK 9 consideration   History of GI bleed in [place] 04/2017 secondary to large erosion and duodenal bulb that was clipped.    Medication Adjustments/Labs and Tests Ordered: Current medicines are reviewed at length with the patient today.  Concerns regarding medicines are outlined above.  Medication changes, Labs and Tests  ordered today are listed in the Patient Instructions below. Patient Instructions  Medication Instructions:   If you need a refill on your cardiac medications before your next appointment, please call your pharmacy.   Lab work:  If you have labs (blood work) drawn today and your tests are completely normal, you will receive your results only by: Marland Kitchen MyChart Message (if you have MyChart) OR . A paper copy in the mail If you have any lab test that is abnormal or we need to change your treatment, we will call you to review the results.  Testing/Procedures: NONE ordered today.  Follow-Up: At [facility], you and your health needs are our priority.  As part of our continuing mission to provide you with exceptional heart care, we have created designated Provider Care Teams.  These Care Teams include your primary Cardiologist (physician) and Advanced Practice Providers (APPs -  Physician Assistants and Nurse Practitioners) who all work together to provide you with the care you need, when you need it. You will need a follow up appointment in 6 months.  Please call our office 2 months in advance to schedule this appointment.  You may see Jenkins Rouge, MD or one of the following Advanced Practice Providers on your designated Care Team:   Truitt Merle, NP Cecilie Kicks, NP . Kathyrn Drown, NP       Signed, Jenkins Rouge, MD  01/07/2018 11:10 AM  [facility] [address], [place], [place]  [address] Phone: [phone]; Fax: [phone]

## 2018-01-01 ENCOUNTER — Encounter (HOSPITAL_COMMUNITY): Payer: Medicare HMO

## 2018-01-01 ENCOUNTER — Encounter (HOSPITAL_COMMUNITY)
Admission: RE | Admit: 2018-01-01 | Discharge: 2018-01-01 | Disposition: A | Discharge status: Home / Self Care | Payer: Medicare HMO | Source: Ambulatory Visit | Attending: Cardiovascular Disease | Admitting: Cardiovascular Disease

## 2018-01-01 DIAGNOSIS — F419 Anxiety disorder, unspecified: Secondary | ICD-10-CM | POA: Diagnosis not present

## 2018-01-01 DIAGNOSIS — Z955 Presence of coronary angioplasty implant and graft: Secondary | ICD-10-CM

## 2018-01-01 DIAGNOSIS — I2510 Atherosclerotic heart disease of native coronary artery without angina pectoris: Secondary | ICD-10-CM | POA: Diagnosis not present

## 2018-01-01 DIAGNOSIS — Z79899 Other long term (current) drug therapy: Secondary | ICD-10-CM | POA: Diagnosis not present

## 2018-01-01 DIAGNOSIS — M1990 Unspecified osteoarthritis, unspecified site: Secondary | ICD-10-CM | POA: Diagnosis not present

## 2018-01-01 DIAGNOSIS — Z7902 Long term (current) use of antithrombotics/antiplatelets: Secondary | ICD-10-CM | POA: Diagnosis not present

## 2018-01-01 DIAGNOSIS — K219 Gastro-esophageal reflux disease without esophagitis: Secondary | ICD-10-CM | POA: Diagnosis not present

## 2018-01-01 DIAGNOSIS — E7800 Pure hypercholesterolemia, unspecified: Secondary | ICD-10-CM | POA: Diagnosis not present

## 2018-01-01 DIAGNOSIS — I10 Essential (primary) hypertension: Secondary | ICD-10-CM | POA: Diagnosis not present

## 2018-01-04 ENCOUNTER — Encounter (HOSPITAL_COMMUNITY)
Admission: RE | Admit: 2018-01-04 | Discharge: 2018-01-04 | Disposition: A | Discharge status: Home / Self Care | Payer: Medicare HMO | Source: Ambulatory Visit | Attending: Cardiovascular Disease | Admitting: Cardiovascular Disease

## 2018-01-04 ENCOUNTER — Encounter (HOSPITAL_COMMUNITY): Payer: Medicare HMO

## 2018-01-04 DIAGNOSIS — I10 Essential (primary) hypertension: Secondary | ICD-10-CM | POA: Diagnosis not present

## 2018-01-04 DIAGNOSIS — Z7902 Long term (current) use of antithrombotics/antiplatelets: Secondary | ICD-10-CM | POA: Diagnosis not present

## 2018-01-04 DIAGNOSIS — Z955 Presence of coronary angioplasty implant and graft: Secondary | ICD-10-CM

## 2018-01-04 DIAGNOSIS — Z79899 Other long term (current) drug therapy: Secondary | ICD-10-CM | POA: Diagnosis not present

## 2018-01-04 DIAGNOSIS — K219 Gastro-esophageal reflux disease without esophagitis: Secondary | ICD-10-CM | POA: Diagnosis not present

## 2018-01-04 DIAGNOSIS — I2510 Atherosclerotic heart disease of native coronary artery without angina pectoris: Secondary | ICD-10-CM | POA: Diagnosis not present

## 2018-01-04 DIAGNOSIS — E7800 Pure hypercholesterolemia, unspecified: Secondary | ICD-10-CM | POA: Diagnosis not present

## 2018-01-04 DIAGNOSIS — M1990 Unspecified osteoarthritis, unspecified site: Secondary | ICD-10-CM | POA: Diagnosis not present

## 2018-01-04 DIAGNOSIS — F419 Anxiety disorder, unspecified: Secondary | ICD-10-CM | POA: Diagnosis not present

## 2018-01-06 ENCOUNTER — Encounter (HOSPITAL_COMMUNITY)
Admission: RE | Admit: 2018-01-06 | Discharge: 2018-01-06 | Disposition: A | Discharge status: Home / Self Care | Payer: Medicare HMO | Source: Ambulatory Visit | Attending: Cardiovascular Disease | Admitting: Cardiovascular Disease

## 2018-01-06 ENCOUNTER — Encounter (HOSPITAL_COMMUNITY): Payer: Medicare HMO

## 2018-01-06 DIAGNOSIS — M1990 Unspecified osteoarthritis, unspecified site: Secondary | ICD-10-CM | POA: Diagnosis not present

## 2018-01-06 DIAGNOSIS — F419 Anxiety disorder, unspecified: Secondary | ICD-10-CM | POA: Diagnosis not present

## 2018-01-06 DIAGNOSIS — E7800 Pure hypercholesterolemia, unspecified: Secondary | ICD-10-CM | POA: Diagnosis not present

## 2018-01-06 DIAGNOSIS — K219 Gastro-esophageal reflux disease without esophagitis: Secondary | ICD-10-CM | POA: Diagnosis not present

## 2018-01-06 DIAGNOSIS — Z7902 Long term (current) use of antithrombotics/antiplatelets: Secondary | ICD-10-CM | POA: Diagnosis not present

## 2018-01-06 DIAGNOSIS — Z79899 Other long term (current) drug therapy: Secondary | ICD-10-CM | POA: Diagnosis not present

## 2018-01-06 DIAGNOSIS — Z955 Presence of coronary angioplasty implant and graft: Secondary | ICD-10-CM | POA: Diagnosis not present

## 2018-01-06 DIAGNOSIS — I10 Essential (primary) hypertension: Secondary | ICD-10-CM | POA: Diagnosis not present

## 2018-01-06 DIAGNOSIS — I2510 Atherosclerotic heart disease of native coronary artery without angina pectoris: Secondary | ICD-10-CM | POA: Diagnosis not present

## 2018-01-07 ENCOUNTER — Encounter: Payer: Self-pay | Admitting: Cardiovascular Disease

## 2018-01-07 ENCOUNTER — Ambulatory Visit (INDEPENDENT_AMBULATORY_CARE_PROVIDER_SITE_OTHER): Payer: Medicare HMO | Admitting: Cardiovascular Disease

## 2018-01-07 ENCOUNTER — Encounter

## 2018-01-07 VITALS — BP 128/72 | HR 79 | Ht 66.0 in | Wt 190.6 lb

## 2018-01-07 DIAGNOSIS — I2510 Atherosclerotic heart disease of native coronary artery without angina pectoris: Secondary | ICD-10-CM

## 2018-01-07 NOTE — Patient Instructions (Signed)

## 2018-01-08 ENCOUNTER — Encounter (HOSPITAL_COMMUNITY)
Admission: RE | Admit: 2018-01-08 | Discharge: 2018-01-08 | Disposition: A | Discharge status: Home / Self Care | Payer: Medicare HMO | Source: Ambulatory Visit | Attending: Cardiovascular Disease | Admitting: Cardiovascular Disease

## 2018-01-08 ENCOUNTER — Encounter (HOSPITAL_COMMUNITY): Payer: Medicare HMO

## 2018-01-08 DIAGNOSIS — M1990 Unspecified osteoarthritis, unspecified site: Secondary | ICD-10-CM | POA: Diagnosis not present

## 2018-01-08 DIAGNOSIS — I10 Essential (primary) hypertension: Secondary | ICD-10-CM | POA: Diagnosis not present

## 2018-01-08 DIAGNOSIS — E7800 Pure hypercholesterolemia, unspecified: Secondary | ICD-10-CM | POA: Diagnosis not present

## 2018-01-08 DIAGNOSIS — Z955 Presence of coronary angioplasty implant and graft: Secondary | ICD-10-CM | POA: Diagnosis not present

## 2018-01-08 DIAGNOSIS — I2510 Atherosclerotic heart disease of native coronary artery without angina pectoris: Secondary | ICD-10-CM | POA: Diagnosis not present

## 2018-01-08 DIAGNOSIS — F419 Anxiety disorder, unspecified: Secondary | ICD-10-CM | POA: Diagnosis not present

## 2018-01-08 DIAGNOSIS — K219 Gastro-esophageal reflux disease without esophagitis: Secondary | ICD-10-CM | POA: Diagnosis not present

## 2018-01-08 DIAGNOSIS — Z7902 Long term (current) use of antithrombotics/antiplatelets: Secondary | ICD-10-CM | POA: Diagnosis not present

## 2018-01-08 DIAGNOSIS — Z79899 Other long term (current) drug therapy: Secondary | ICD-10-CM | POA: Diagnosis not present

## 2018-01-11 ENCOUNTER — Encounter (HOSPITAL_COMMUNITY): Payer: Medicare HMO

## 2018-01-13 ENCOUNTER — Encounter (HOSPITAL_COMMUNITY): Payer: Medicare HMO

## 2018-01-14 ENCOUNTER — Encounter (HOSPITAL_COMMUNITY): Payer: Self-pay | Admitting: *Deleted

## 2018-01-14 DIAGNOSIS — Z955 Presence of coronary angioplasty implant and graft: Secondary | ICD-10-CM

## 2018-01-14 NOTE — Progress Notes (Signed)
Cardiac Individual Treatment Plan  Patient Details  Name: [name] MRN: [id number] Date of Birth: [birth year] Referring Provider:     [facility] from 12/24/2017 in [place]  Referring Provider  Josue Hector MD       Initial Encounter Date:    CARDIAC REHAB PHASE II ORIENTATION from 12/24/2017 in [facility]  Date  12/24/17      Visit Diagnosis: 10/20/2017 Status post coronary artery stent placement  Patient's Home Medications on Admission:  Current Outpatient Medications:  .  carvedilol (COREG) 3.125 MG tablet, Take 1 tablet (3.125 mg total) by mouth 2 (two) times daily with a meal., Disp: 180 tablet, Rfl: 3 .  clopidogrel (PLAVIX) 75 MG tablet, Take 1 tablet (75 mg total) by mouth daily., Disp: 30 tablet, Rfl: 3 .  Cyanocobalamin (VITAMIN B-12) 5000 MCG LOZG, Take 5,000 Units by mouth daily., Disp: , Rfl:  .  dicyclomine (BENTYL) 20 MG tablet, Take 20 mg by mouth 2 (two) times daily after a meal. , Disp: , Rfl:  .  LORazepam (ATIVAN) 0.5 MG tablet, Take 1 tablet (0.5 mg total) by mouth every 8 (eight) hours as needed. For anxiety, Disp: 60 tablet, Rfl: 5 .  nitroGLYCERIN (NITROSTAT) 0.4 MG SL tablet, Place 0.4 mg under the tongue every 5 (five) minutes as needed for chest pain. For a max of 3 doses. If no relief, call 911., Disp: , Rfl:  .  omega-3 acid ethyl esters (LOVAZA) 1 G capsule, Take 1 g by mouth daily., Disp: , Rfl:  .  OVER THE COUNTER MEDICATION, Take 1 capsule by mouth 3 (three) times daily. Herbal supplement from Dr. Ardeth Perfect  - to strengthen intestinal lining - mixture of fruits and vegetables, Disp: , Rfl:  .  OVER THE COUNTER MEDICATION, Take 1 Scoop by mouth daily. Patriot Powder  Contains 20 fruits & vegetables, 10 probiotics and 6 digestive enzymes, Disp: , Rfl:  .  pantoprazole (PROTONIX) 40 MG tablet, Take 1 tablet (40 mg total) by mouth daily., Disp: 30 tablet, Rfl:  11 .  polyethylene glycol (MIRALAX / GLYCOLAX) packet, Take 17 g by mouth at bedtime. Mix in 8 oz water and drink, Disp: , Rfl:  .  Probiotic Product (ALIGN) 4 MG CAPS, Take 4 mg by mouth daily. , Disp: , Rfl:  .  sacubitril-valsartan (ENTRESTO) 97-103 MG, Take 1 tablet by mouth 2 (two) times daily., Disp: 60 tablet, Rfl: 11 .  vitamin C (ASCORBIC ACID) 500 MG tablet, Take 500 mg by mouth daily. , Disp: , Rfl:  .  zolpidem (AMBIEN) 10 MG tablet, Take 1 tablet (10 mg total) by mouth at bedtime as needed. For sleep (Patient taking differently: Take 5 mg by mouth at bedtime as needed for sleep. ), Disp: 30 tablet, Rfl: 5  Past Medical History: Past Medical History:  Diagnosis Date  . Anxiety   . Arthritis    "wrists" (07/27/2014)  . Coronary artery disease    a.  Pt with 4 stents over the course of 2003-2012;  b.  LHC 6/16:  LAD, RCA, LCx stents patent, OM1 50%, EF 50-55% >> med rx   . Elevated LFTs   . GERD (gastroesophageal reflux disease)   . Hypercholesterolemia   . Hypertension   . Ischemic cardiomyopathy    cMRI 03/2011:  EF 48% with dist ant, septal, apical and inf-apical dyskinesia, no apical clot, dist ant, apical and septal full thickness  scar >> No ICD needed  //  Limited echo 7/19: Large apical septal aneurysm, severe hypokinesis of the entire apex and mid to apical segments of the anterior wall, anterolateral wall and anterior septum-consistent with infarct and most of the LAD territory; EF 35   . Left ventricular aneurysm    Limited echo 7/19: Large apical septal aneurysm, severe hypokinesis of the entire apex and mid to apical segments of the anterior wall, anterolateral wall and anterior septum-consistent with infarct and most of the LAD territory; EF 35  . Myocardial infarction ([place]) 2003    Tobacco Use: Social History   Tobacco Use  Smoking Status Former Smoker  . Packs/day: 1.00  . Years: 15.00  . Pack years: 15.00  . Types: Cigarettes  Smokeless Tobacco Never Used   Tobacco Comment   "quit smoking cigarettes in 1979"    Labs: Recent Review Flowsheet Data    Labs for ITP Cardiac and Pulmonary Rehab Latest Ref Rng & Units 06/02/2007 06/03/2007 12/18/2007 01/08/2011 06/11/2012   Cholestrol 0 - 200 mg/dL - 149 ATP III CLASSIFICATION: <200     mg/dL   Desirable 200-239  mg/dL   Borderline High >=240    mg/dL   High 200 ATP III CLASSIFICATION: <200     mg/dL   Desirable 200-239  mg/dL   Borderline High >=240    mg/dL   High 167 180   LDLCALC 0 - 99 mg/dL - 97 Total Cholesterol/HDL:CHD Risk Coronary Heart Disease Risk Table Men   Women 1/2 Average Risk   3.4   3.3 118 Total Cholesterol/HDL:CHD Risk Coronary Heart Disease Risk Table Men   Women 1/2 Average Risk   3.4   3.3(H) 77 73   HDL >39 mg/dL - 31(L) 34(L) 30(L) 34(L)   Trlycerides <150 mg/dL - 106 238(H) 299(H) 367(H)   Hemoglobin A1c <5.7 % - 5.8 (NOTE)   The ADA recommends the following therapeutic goals for glycemic   control related to Hgb A1C measurement:   Goal of Therapy:   < 7.0% Hgb A1C   Action Suggested:  > 8.0% Hgb A1C   Ref:  Diabetes Care, 22, Suppl. 1, 1999 - 6.0(H) -   TCO2 - 24 - - - -      Capillary Blood Glucose: No results found for: GLUCAP   Exercise Target Goals: Exercise Program Goal: Individual exercise prescription set using results from initial 6 min walk test and THRR while considering  patient's activity barriers and safety.   Exercise Prescription Goal: Initial exercise prescription builds to 30-45 minutes a day of aerobic activity, 2-3 days per week.  Home exercise guidelines will be given to patient during program as part of exercise prescription that the participant will acknowledge.  Activity Barriers & Risk Stratification: Activity Barriers & Cardiac Risk Stratification - 12/24/17 0957      Activity Barriers & Cardiac Risk Stratification   Activity Barriers  Deconditioning    Cardiac Risk Stratification  Moderate       6 Minute Walk: 6 Minute  Walk    Row Name 12/24/17 0952         6 Minute Walk   Phase  Initial     Distance  1572 feet     Walk Time  6 minutes     # of Rest Breaks  0     MPH  2.98     METS  3.03     RPE  11     Perceived Dyspnea  0     VO2 Peak  10.6     Symptoms  No     Resting HR  79 bpm     Resting BP  122/64     Resting Oxygen Saturation   97 %     Exercise Oxygen Saturation  during 6 min walk  98 %     Max Ex. HR  101 bpm     Max Ex. BP  126/64     2 Minute Post BP  122/70        Oxygen Initial Assessment:   Oxygen Re-Evaluation:   Oxygen Discharge (Final Oxygen Re-Evaluation):   Initial Exercise Prescription: Initial Exercise Prescription - 12/24/17 1000      Date of Initial Exercise RX and Referring Provider   Date  12/24/17    Referring Provider  Josue Hector MD     Expected Discharge Date  04/02/17      Treadmill   MPH  2.3    Grade  1    Minutes  10    METs  2.99      Bike   Level  0.7    Minutes  10    METs  2.55      NuStep   Level  2    SPM  75    Minutes  10    METs  2.8      Prescription Details   Frequency (times per week)  3x    Duration  Progress to 30 minutes of continuous aerobic without signs/symptoms of physical distress      Intensity   THRR 40-80% of Max Heartrate  59-118    Ratings of Perceived Exertion  11-13    Perceived Dyspnea  0-4      Progression   Progression  Continue progressive overload as per policy without signs/symptoms or physical distress.      Resistance Training   Training Prescription  Yes    Weight  3lbs    Reps  10-15       Perform Capillary Blood Glucose checks as needed.  Exercise Prescription Changes: Exercise Prescription Changes    Row Name 12/28/17 1454 01/04/18 1452           Response to Exercise   Blood Pressure (Admit)  108/62  102/64      Blood Pressure (Exercise)  128/76  132/74      Blood Pressure (Exit)  112/62  108/72      Heart Rate (Admit)  80 bpm  73 bpm      Heart Rate (Exercise)   96 bpm  98 bpm      Heart Rate (Exit)  80 bpm  56 bpm      Rating of Perceived Exertion (Exercise)  11  12      Symptoms  none  none      Duration  Progress to 30 minutes of  aerobic without signs/symptoms of physical distress  Progress to 30 minutes of  aerobic without signs/symptoms of physical distress      Intensity  THRR unchanged  THRR unchanged        Progression   Progression  Continue to progress workloads to maintain intensity without signs/symptoms of physical distress.  Continue to progress workloads to maintain intensity without signs/symptoms of physical distress.      Average METs  2.6  2.8        Resistance Training   Training Prescription  Yes  Yes  Weight  3lbs  4lbs      Reps  10-15  10-15      Time  10 Minutes  10 Minutes        Interval Training   Interval Training  No  No        Treadmill   MPH  2.3  2.5      Grade  1  1      Minutes  10  10      METs  3.08  3.26        Bike   Level  0.7  0.7      Minutes  10  10      METs  2.52  2.52        NuStep   Level  2  2      SPM  75  75      Minutes  10  10      METs  2.3  2.6         Exercise Comments: Exercise Comments    Row Name 01/04/18 1552           Exercise Comments  Patient doing well with exercise, will increase workloads as tolerated.          Exercise Goals and Review: Exercise Goals    Row Name 12/24/17 0956             Exercise Goals   Increase Physical Activity  Yes       Intervention  Provide advice, education, support and counseling about physical activity/exercise needs.;Develop an individualized exercise prescription for aerobic and resistive training based on initial evaluation findings, risk stratification, comorbidities and participant's personal goals.       Expected Outcomes  Short Term: Attend rehab on a regular basis to increase amount of physical activity.;Long Term: Add in home exercise to make exercise part of routine and to increase amount of physical  activity.;Long Term: Exercising regularly at least 3-5 days a week.       Increase Strength and Stamina  Yes       Intervention  Develop an individualized exercise prescription for aerobic and resistive training based on initial evaluation findings, risk stratification, comorbidities and participant's personal goals.;Provide advice, education, support and counseling about physical activity/exercise needs.       Expected Outcomes  Short Term: Increase workloads from initial exercise prescription for resistance, speed, and METs.;Short Term: Perform resistance training exercises routinely during rehab and add in resistance training at home;Long Term: Improve cardiorespiratory fitness, muscular endurance and strength as measured by increased METs and functional capacity (6MWT)       Able to understand and use rate of perceived exertion (RPE) scale  Yes       Intervention  Provide education and explanation on how to use RPE scale       Expected Outcomes  Short Term: Able to use RPE daily in rehab to express subjective intensity level;Long Term:  Able to use RPE to guide intensity level when exercising independently       Knowledge and understanding of Target Heart Rate Range (THRR)  Yes       Intervention  Provide education and explanation of THRR including how the numbers were predicted and where they are located for reference       Expected Outcomes  Short Term: Able to state/look up THRR;Long Term: Able to use THRR to govern intensity when exercising independently;Short Term: Able to use daily as guideline  for intensity in rehab       Understanding of Exercise Prescription  Yes       Intervention  Provide education, explanation, and written materials on patient's individual exercise prescription       Expected Outcomes  Short Term: Able to explain program exercise prescription;Long Term: Able to explain home exercise prescription to exercise independently          Exercise Goals Re-Evaluation  : Exercise Goals Re-Evaluation    Row Name 01/04/18 1552             Exercise Goal Re-Evaluation   Exercise Goals Review  Increase Physical Activity;Able to understand and use rate of perceived exertion (RPE) scale       Comments  Patient able to understand and use RPE scale appropriately.       Expected Outcomes  Increase workloads as tolerated to help improve cardiorespiratory fitness.          Discharge Exercise Prescription (Final Exercise Prescription Changes): Exercise Prescription Changes - 01/04/18 1452      Response to Exercise   Blood Pressure (Admit)  102/64    Blood Pressure (Exercise)  132/74    Blood Pressure (Exit)  108/72    Heart Rate (Admit)  73 bpm    Heart Rate (Exercise)  98 bpm    Heart Rate (Exit)  56 bpm    Rating of Perceived Exertion (Exercise)  12    Symptoms  none    Duration  Progress to 30 minutes of  aerobic without signs/symptoms of physical distress    Intensity  THRR unchanged      Progression   Progression  Continue to progress workloads to maintain intensity without signs/symptoms of physical distress.    Average METs  2.8      Resistance Training   Training Prescription  Yes    Weight  4lbs    Reps  10-15    Time  10 Minutes      Interval Training   Interval Training  No      Treadmill   MPH  2.5    Grade  1    Minutes  10    METs  3.26      Bike   Level  0.7    Minutes  10    METs  2.52      NuStep   Level  2    SPM  75    Minutes  10    METs  2.6       Nutrition:  Target Goals: Understanding of nutrition guidelines, daily intake of sodium 1500mg , cholesterol 200mg , calories 30% from fat and 7% or less from saturated fats, daily to have 5 or more servings of fruits and vegetables.  Biometrics: Pre Biometrics - 12/24/17 0954      Pre Biometrics   Height  5\' 6"  (1.676 m)    Weight  191 lb 5.8 oz (86.8 kg)    Waist Circumference  43 inches    Hip Circumference  41.5 inches    Waist to Hip Ratio  1.04 %     BMI (Calculated)  30.9    Triceps Skinfold  21 mm    % Body Fat  31.9 %    Grip Strength  39 kg    Flexibility  0 in    Single Leg Stand  2.69 seconds        Nutrition Therapy Plan and Nutrition Goals: Nutrition Therapy & Goals - 12/24/17  1058      Nutrition Therapy   Diet  heart healthy, carb modified      Personal Nutrition Goals   Nutrition Goal  Pt to identify and limit food sources of saturated fat, trans fat, refined carbohydrates and sodium    Personal Goal #2  Pt able to name foods that affect blood glucose.    Personal Goal #3  Pt to reduce frequency of meals eaten out      Intervention Plan   Intervention  Prescribe, educate and counsel regarding individualized specific dietary modifications aiming towards targeted core components such as weight, hypertension, lipid management, diabetes, heart failure and other comorbidities.    Expected Outcomes  Short Term Goal: Understand basic principles of dietary content, such as calories, fat, sodium, cholesterol and nutrients.;Long Term Goal: Adherence to prescribed nutrition plan.       Nutrition Assessments: Nutrition Assessments - 12/24/17 1100      MEDFICTS Scores   Pre Score  46       Nutrition Goals Re-Evaluation: Nutrition Goals Re-Evaluation    Row Name 12/24/17 1058             Goals   Current Weight  191 lb 5.8 oz (86.8 kg)          Nutrition Goals Re-Evaluation: Nutrition Goals Re-Evaluation    [place] Name 12/24/17 1058             Goals   Current Weight  191 lb 5.8 oz (86.8 kg)          Nutrition Goals Discharge (Final Nutrition Goals Re-Evaluation): Nutrition Goals Re-Evaluation - 12/24/17 1058      Goals   Current Weight  191 lb 5.8 oz (86.8 kg)       Psychosocial: Target Goals: Acknowledge presence or absence of significant depression and/or stress, maximize coping skills, provide positive support system. Participant is able to verbalize types and ability to use techniques and skills  needed for reducing stress and depression.  Initial Review & Psychosocial Screening: Initial Psych Review & Screening - 12/24/17 1050      Initial Review   Current issues with  None Identified      Family Dynamics   Good Support System?  Yes   family, friends      Barriers   Psychosocial barriers to participate in program  There are no identifiable barriers or psychosocial needs.      Screening Interventions   Interventions  Encouraged to exercise       Quality of Life Scores: Quality of Life - 12/24/17 1004      Quality of Life   Select  Quality of Life      Quality of Life Scores   Health/Function Pre  19.87 %    Socioeconomic Pre  20.5 %    Psych/Spiritual Pre  20.36 %    Family Pre  26.4 %    GLOBAL Pre  21.06 %      Scores of 19 and below usually indicate a poorer quality of life in these areas.  A difference of  2-3 points is a clinically meaningful difference.  A difference of 2-3 points in the total score of the Quality of Life Index has been associated with significant improvement in overall quality of life, self-image, physical symptoms, and general health in studies assessing change in quality of life.  PHQ-9: Recent Review Flowsheet Data    There is no flowsheet data to display.     Interpretation of  Total Score  Total Score Depression Severity:  1-4 = Minimal depression, 5-9 = Mild depression, 10-14 = Moderate depression, 15-19 = Moderately severe depression, 20-27 = Severe depression   Psychosocial Evaluation and Intervention: Psychosocial Evaluation - 12/28/17 1557      Psychosocial Evaluation & Interventions   Interventions  Encouraged to exercise with the program and follow exercise prescription    Comments  no psychosocial needs identified, no interventions necessary.  However pt unable to verbalize activities that he enjoys.      Expected Outcomes  pt will exhibit positive outlook with good coping skills.     Continue Psychosocial Services   No  Follow up required       Psychosocial Re-Evaluation: Psychosocial Re-Evaluation    [place] Name 01/14/18 1617             Psychosocial Re-Evaluation   Current issues with  None Identified       Interventions  Encouraged to attend Cardiac Rehabilitation for the exercise       Continue Psychosocial Services   No Follow up required          Psychosocial Discharge (Final Psychosocial Re-Evaluation): Psychosocial Re-Evaluation - 01/14/18 1617      Psychosocial Re-Evaluation   Current issues with  None Identified    Interventions  Encouraged to attend Cardiac Rehabilitation for the exercise    Continue Psychosocial Services   No Follow up required       Vocational Rehabilitation: Provide vocational rehab assistance to qualifying candidates.   Vocational Rehab Evaluation & Intervention: Vocational Rehab - 12/24/17 1050      Initial Vocational Rehab Evaluation & Intervention   Assessment shows need for Vocational Rehabilitation  No   retired Clinical biochemist      Education: Education Goals: Education classes will be provided on a weekly basis, covering required topics. Participant will state understanding/return demonstration of topics presented.  Learning Barriers/Preferences: Learning Barriers/Preferences - 12/24/17 1004      Learning Barriers/Preferences   Learning Barriers  Sight    Learning Preferences  Skilled Demonstration;Verbal Instruction;Written Material       Education Topics: Count Your Pulse:  -Group instruction provided by verbal instruction, demonstration, patient participation and written materials to support subject.  Instructors address importance of being able to find your pulse and how to count your pulse when at home without a heart monitor.  Patients get hands on experience counting their pulse with staff help and individually.   Heart Attack, Angina, and Risk Factor Modification:  -Group instruction provided by verbal instruction, video, and written  materials to support subject.  Instructors address signs and symptoms of angina and heart attacks.    Also discuss risk factors for heart disease and how to make changes to improve heart health risk factors.   Functional Fitness:  -Group instruction provided by verbal instruction, demonstration, patient participation, and written materials to support subject.  Instructors address safety measures for doing things around the house.  Discuss how to get up and down off the floor, how to pick things up properly, how to safely get out of a chair without assistance, and balance training.   CARDIAC REHAB PHASE II EXERCISE from 01/08/2018 in [employer]  Date  01/08/18  Instruction Review Code  2- Demonstrated Understanding      Meditation and Mindfulness:  -Group instruction provided by verbal instruction, patient participation, and written materials to support subject.  Instructor addresses importance of mindfulness and meditation practice to help  reduce stress and improve awareness.  Instructor also leads participants through a meditation exercise.    Stretching for Flexibility and Mobility:  -Group instruction provided by verbal instruction, patient participation, and written materials to support subject.  Instructors lead participants through series of stretches that are designed to increase flexibility thus improving mobility.  These stretches are additional exercise for major muscle groups that are typically performed during regular warm up and cool down.   Hands Only CPR:  -Group verbal, video, and participation provides a basic overview of AHA guidelines for community CPR. Role-play of emergencies allow participants the opportunity to practice calling for help and chest compression technique with discussion of AED use.   Hypertension: -Group verbal and written instruction that provides a basic overview of hypertension including the most recent diagnostic  guidelines, risk factor reduction with self-care instructions and medication management.    Nutrition I class: Heart Healthy Eating:  -Group instruction provided by PowerPoint slides, verbal discussion, and written materials to support subject matter. The instructor gives an explanation and review of the Therapeutic Lifestyle Changes diet recommendations, which includes a discussion on lipid goals, dietary fat, sodium, fiber, plant stanol/sterol esters, sugar, and the components of a well-balanced, healthy diet.   Nutrition II class: Lifestyle Skills:  -Group instruction provided by PowerPoint slides, verbal discussion, and written materials to support subject matter. The instructor gives an explanation and review of label reading, grocery shopping for heart health, heart healthy recipe modifications, and ways to make healthier choices when eating out.   Diabetes Question & Answer:  -Group instruction provided by PowerPoint slides, verbal discussion, and written materials to support subject matter. The instructor gives an explanation and review of diabetes co-morbidities, pre- and post-prandial blood glucose goals, pre-exercise blood glucose goals, signs, symptoms, and treatment of hypoglycemia and hyperglycemia, and foot care basics.   Diabetes Blitz:  -Group instruction provided by PowerPoint slides, verbal discussion, and written materials to support subject matter. The instructor gives an explanation and review of the physiology behind type 1 and type 2 diabetes, diabetes medications and rational behind using different medications, pre- and post-prandial blood glucose recommendations and Hemoglobin A1c goals, diabetes diet, and exercise including blood glucose guidelines for exercising safely.    Portion Distortion:  -Group instruction provided by PowerPoint slides, verbal discussion, written materials, and food models to support subject matter. The instructor gives an explanation of serving  size versus portion size, changes in portions sizes over the last 20 years, and what consists of a serving from each food group.   Stress Management:  -Group instruction provided by verbal instruction, video, and written materials to support subject matter.  Instructors review role of stress in heart disease and how to cope with stress positively.     Exercising on Your Own:  -Group instruction provided by verbal instruction, power point, and written materials to support subject.  Instructors discuss benefits of exercise, components of exercise, frequency and intensity of exercise, and end points for exercise.  Also discuss use of nitroglycerin and activating EMS.  Review options of places to exercise outside of rehab.  Review guidelines for sex with heart disease.   Cardiac Drugs I:  -Group instruction provided by verbal instruction and written materials to support subject.  Instructor reviews cardiac drug classes: antiplatelets, anticoagulants, beta blockers, and statins.  Instructor discusses reasons, side effects, and lifestyle considerations for each drug class.   Cardiac Drugs II:  -Group instruction provided by verbal instruction and written materials to support subject.  Instructor reviews cardiac drug classes: angiotensin converting enzyme inhibitors (ACE-I), angiotensin II receptor blockers (ARBs), nitrates, and calcium channel blockers.  Instructor discusses reasons, side effects, and lifestyle considerations for each drug class.   Anatomy and Physiology of the Circulatory System:  Group verbal and written instruction and models provide basic cardiac anatomy and physiology, with the coronary electrical and arterial systems. Review of: AMI, Angina, Valve disease, Heart Failure, Peripheral Artery Disease, Cardiac Arrhythmia, Pacemakers, and the ICD.   Other Education:  -Group or individual verbal, written, or video instructions that support the educational goals of the cardiac rehab  program.   Holiday Eating Survival Tips:  -Group instruction provided by PowerPoint slides, verbal discussion, and written materials to support subject matter. The instructor gives patients tips, tricks, and techniques to help them not only survive but enjoy the holidays despite the onslaught of food that accompanies the holidays.   Knowledge Questionnaire Score: Knowledge Questionnaire Score - 12/24/17 1004      Knowledge Questionnaire Score   Pre Score  20/24       Core Components/Risk Factors/Patient Goals at Admission: Personal Goals and Risk Factors at Admission - 12/24/17 1005      Core Components/Risk Factors/Patient Goals on Admission    Weight Management  Yes;Weight Loss    Intervention  Weight Management: Develop a combined nutrition and exercise program designed to reach desired caloric intake, while maintaining appropriate intake of nutrient and fiber, sodium and fats, and appropriate energy expenditure required for the weight goal.;Weight Management: Provide education and appropriate resources to help participant work on and attain dietary goals.;Weight Management/Obesity: Establish reasonable short term and long term weight goals.;Obesity: Provide education and appropriate resources to help participant work on and attain dietary goals.    Admit Weight  191 lb 5.8 oz (86.8 kg)    Goal Weight: Long Term  180 lb (81.6 kg)    Expected Outcomes  Short Term: Continue to assess and modify interventions until short term weight is achieved;Long Term: Adherence to nutrition and physical activity/exercise program aimed toward attainment of established weight goal;Weight Loss: Understanding of general recommendations for a balanced deficit meal plan, which promotes 1-2 lb weight loss per week and includes a negative energy balance of [phone] kcal/d;Understanding recommendations for meals to include 15-35% energy as protein, 25-35% energy from fat, 35-60% energy from carbohydrates, less than  200mg  of dietary cholesterol, 20-35 gm of total fiber daily;Understanding of distribution of calorie intake throughout the day with the consumption of 4-5 meals/snacks    Hypertension  Yes    Intervention  Monitor prescription use compliance.;Provide education on lifestyle modifcations including regular physical activity/exercise, weight management, moderate sodium restriction and increased consumption of fresh fruit, vegetables, and low fat dairy, alcohol moderation, and smoking cessation.    Expected Outcomes  Short Term: Continued assessment and intervention until BP is < 140/40mm HG in hypertensive participants. < 130/67mm HG in hypertensive participants with diabetes, heart failure or chronic kidney disease.;Long Term: Maintenance of blood pressure at goal levels.    Lipids  Yes    Intervention  Provide education and support for participant on nutrition & aerobic/resistive exercise along with prescribed medications to achieve LDL 70mg , HDL >40mg .    Expected Outcomes  Short Term: Participant states understanding of desired cholesterol values and is compliant with medications prescribed. Participant is following exercise prescription and nutrition guidelines.;Long Term: Cholesterol controlled with medications as prescribed, with individualized exercise RX and with personalized nutrition plan. Value goals: LDL < 70mg , HDL >  40 mg.       Core Components/Risk Factors/Patient Goals Review:  Goals and Risk Factor Review    Row Name 01/14/18 1618             Core Components/Risk Factors/Patient Goals Review   Personal Goals Review  Weight Management/Obesity;Lipids;Hypertension       Review  Bob's vital signs have been stable at cardiac rehab. Mikki Santee has done well with exercise when in attendance       Expected Outcomes  Mikki Santee will continue to participate in phase 2 cardiac rehab for exercise, nutrition and lifestyle modification          Core Components/Risk Factors/Patient Goals at Discharge  (Final Review):  Goals and Risk Factor Review - 01/14/18 1618      Core Components/Risk Factors/Patient Goals Review   Personal Goals Review  Weight Management/Obesity;Lipids;Hypertension    Review  Bob's vital signs have been stable at cardiac rehab. Mikki Santee has done well with exercise when in attendance    Expected Outcomes  Mikki Santee will continue to participate in phase 2 cardiac rehab for exercise, nutrition and lifestyle modification       ITP Comments: ITP Comments    Row Name 12/24/17 0926 12/28/17 1556 01/14/18 1616       ITP Comments  Dr. Fransico Him, Medical Director   pt started group exercise. pt tolerated light activity without difficulty. pt oriented to exercise equipment and safety routine. understanding verbalized.   30 Day ITP comments. Mikki Santee has been absent from cardiac rehab this week but did well when he attended last week        Comments: See ITP comments.Barnet Pall, RN,BSN 01/14/2018 4:21 PM

## 2018-01-15 ENCOUNTER — Encounter (HOSPITAL_COMMUNITY): Payer: Medicare HMO

## 2018-01-18 ENCOUNTER — Encounter (HOSPITAL_COMMUNITY): Payer: Medicare HMO

## 2018-01-18 ENCOUNTER — Encounter (HOSPITAL_COMMUNITY)
Admission: RE | Admit: 2018-01-18 | Discharge: 2018-01-18 | Disposition: A | Discharge status: Home / Self Care | Payer: Medicare HMO | Source: Ambulatory Visit | Attending: Cardiovascular Disease | Admitting: Cardiovascular Disease

## 2018-01-18 DIAGNOSIS — Z79899 Other long term (current) drug therapy: Secondary | ICD-10-CM | POA: Diagnosis not present

## 2018-01-18 DIAGNOSIS — M1990 Unspecified osteoarthritis, unspecified site: Secondary | ICD-10-CM | POA: Diagnosis not present

## 2018-01-18 DIAGNOSIS — F419 Anxiety disorder, unspecified: Secondary | ICD-10-CM | POA: Diagnosis not present

## 2018-01-18 DIAGNOSIS — I2510 Atherosclerotic heart disease of native coronary artery without angina pectoris: Secondary | ICD-10-CM | POA: Diagnosis not present

## 2018-01-18 DIAGNOSIS — I10 Essential (primary) hypertension: Secondary | ICD-10-CM | POA: Diagnosis not present

## 2018-01-18 DIAGNOSIS — E7800 Pure hypercholesterolemia, unspecified: Secondary | ICD-10-CM | POA: Diagnosis not present

## 2018-01-18 DIAGNOSIS — Z955 Presence of coronary angioplasty implant and graft: Secondary | ICD-10-CM

## 2018-01-18 DIAGNOSIS — K219 Gastro-esophageal reflux disease without esophagitis: Secondary | ICD-10-CM | POA: Diagnosis not present

## 2018-01-18 DIAGNOSIS — Z7902 Long term (current) use of antithrombotics/antiplatelets: Secondary | ICD-10-CM | POA: Diagnosis not present

## 2018-01-18 NOTE — Progress Notes (Signed)
Reviewed home exercise guidelines with patient including endpoints, temperature precautions, target heart rate and rate of perceived exertion. Pt plans to walk as his mode of home exercise. Pt is using weights at home daily. Pt voices understanding of instructions given. [name] Passer, MS, ACSM CEP

## 2018-01-20 ENCOUNTER — Encounter (HOSPITAL_COMMUNITY)
Admission: RE | Admit: 2018-01-20 | Discharge: 2018-01-20 | Disposition: A | Discharge status: Home / Self Care | Payer: Medicare HMO | Source: Ambulatory Visit | Attending: Cardiovascular Disease | Admitting: Cardiovascular Disease

## 2018-01-20 ENCOUNTER — Encounter (HOSPITAL_COMMUNITY): Payer: Medicare HMO

## 2018-01-20 DIAGNOSIS — Z79899 Other long term (current) drug therapy: Secondary | ICD-10-CM | POA: Diagnosis not present

## 2018-01-20 DIAGNOSIS — M1990 Unspecified osteoarthritis, unspecified site: Secondary | ICD-10-CM | POA: Diagnosis not present

## 2018-01-20 DIAGNOSIS — E7800 Pure hypercholesterolemia, unspecified: Secondary | ICD-10-CM | POA: Diagnosis not present

## 2018-01-20 DIAGNOSIS — I10 Essential (primary) hypertension: Secondary | ICD-10-CM | POA: Diagnosis not present

## 2018-01-20 DIAGNOSIS — Z7902 Long term (current) use of antithrombotics/antiplatelets: Secondary | ICD-10-CM | POA: Diagnosis not present

## 2018-01-20 DIAGNOSIS — F419 Anxiety disorder, unspecified: Secondary | ICD-10-CM | POA: Diagnosis not present

## 2018-01-20 DIAGNOSIS — Z955 Presence of coronary angioplasty implant and graft: Secondary | ICD-10-CM | POA: Diagnosis not present

## 2018-01-20 DIAGNOSIS — K219 Gastro-esophageal reflux disease without esophagitis: Secondary | ICD-10-CM | POA: Diagnosis not present

## 2018-01-20 DIAGNOSIS — I2510 Atherosclerotic heart disease of native coronary artery without angina pectoris: Secondary | ICD-10-CM | POA: Diagnosis not present

## 2018-01-22 ENCOUNTER — Encounter (HOSPITAL_COMMUNITY): Payer: Medicare HMO

## 2018-01-25 ENCOUNTER — Encounter (HOSPITAL_COMMUNITY)
Admission: RE | Admit: 2018-01-25 | Discharge: 2018-01-25 | Disposition: A | Discharge status: Home / Self Care | Payer: Medicare HMO | Source: Ambulatory Visit | Attending: Cardiovascular Disease | Admitting: Cardiovascular Disease

## 2018-01-25 ENCOUNTER — Encounter (HOSPITAL_COMMUNITY): Payer: Medicare HMO

## 2018-01-25 DIAGNOSIS — I255 Ischemic cardiomyopathy: Secondary | ICD-10-CM | POA: Insufficient documentation

## 2018-01-25 DIAGNOSIS — Z79899 Other long term (current) drug therapy: Secondary | ICD-10-CM | POA: Insufficient documentation

## 2018-01-25 DIAGNOSIS — Z955 Presence of coronary angioplasty implant and graft: Secondary | ICD-10-CM | POA: Insufficient documentation

## 2018-01-25 DIAGNOSIS — F419 Anxiety disorder, unspecified: Secondary | ICD-10-CM | POA: Diagnosis not present

## 2018-01-25 DIAGNOSIS — E7800 Pure hypercholesterolemia, unspecified: Secondary | ICD-10-CM | POA: Diagnosis not present

## 2018-01-25 DIAGNOSIS — M1990 Unspecified osteoarthritis, unspecified site: Secondary | ICD-10-CM | POA: Diagnosis not present

## 2018-01-25 DIAGNOSIS — I252 Old myocardial infarction: Secondary | ICD-10-CM | POA: Diagnosis not present

## 2018-01-25 DIAGNOSIS — I10 Essential (primary) hypertension: Secondary | ICD-10-CM | POA: Insufficient documentation

## 2018-01-25 DIAGNOSIS — Z7902 Long term (current) use of antithrombotics/antiplatelets: Secondary | ICD-10-CM | POA: Insufficient documentation

## 2018-01-25 DIAGNOSIS — Z87891 Personal history of nicotine dependence: Secondary | ICD-10-CM | POA: Insufficient documentation

## 2018-01-25 DIAGNOSIS — I2510 Atherosclerotic heart disease of native coronary artery without angina pectoris: Secondary | ICD-10-CM | POA: Diagnosis not present

## 2018-01-25 DIAGNOSIS — K219 Gastro-esophageal reflux disease without esophagitis: Secondary | ICD-10-CM | POA: Diagnosis not present

## 2018-01-27 ENCOUNTER — Encounter (HOSPITAL_COMMUNITY)
Admission: RE | Admit: 2018-01-27 | Discharge: 2018-01-27 | Disposition: A | Discharge status: Home / Self Care | Payer: Medicare HMO | Source: Ambulatory Visit | Attending: Cardiovascular Disease | Admitting: Cardiovascular Disease

## 2018-01-27 ENCOUNTER — Encounter (HOSPITAL_COMMUNITY): Payer: Medicare HMO

## 2018-01-27 DIAGNOSIS — Z79899 Other long term (current) drug therapy: Secondary | ICD-10-CM | POA: Diagnosis not present

## 2018-01-27 DIAGNOSIS — I10 Essential (primary) hypertension: Secondary | ICD-10-CM | POA: Diagnosis not present

## 2018-01-27 DIAGNOSIS — Z7902 Long term (current) use of antithrombotics/antiplatelets: Secondary | ICD-10-CM | POA: Diagnosis not present

## 2018-01-27 DIAGNOSIS — E7800 Pure hypercholesterolemia, unspecified: Secondary | ICD-10-CM | POA: Diagnosis not present

## 2018-01-27 DIAGNOSIS — I2510 Atherosclerotic heart disease of native coronary artery without angina pectoris: Secondary | ICD-10-CM | POA: Diagnosis not present

## 2018-01-27 DIAGNOSIS — K219 Gastro-esophageal reflux disease without esophagitis: Secondary | ICD-10-CM | POA: Diagnosis not present

## 2018-01-27 DIAGNOSIS — Z955 Presence of coronary angioplasty implant and graft: Secondary | ICD-10-CM

## 2018-01-27 DIAGNOSIS — F419 Anxiety disorder, unspecified: Secondary | ICD-10-CM | POA: Diagnosis not present

## 2018-01-27 DIAGNOSIS — M1990 Unspecified osteoarthritis, unspecified site: Secondary | ICD-10-CM | POA: Diagnosis not present

## 2018-01-29 ENCOUNTER — Encounter (HOSPITAL_COMMUNITY): Payer: Medicare HMO

## 2018-01-29 ENCOUNTER — Encounter (HOSPITAL_COMMUNITY)
Admission: RE | Admit: 2018-01-29 | Discharge: 2018-01-29 | Disposition: A | Discharge status: Home / Self Care | Payer: Medicare HMO | Source: Ambulatory Visit | Attending: Cardiovascular Disease | Admitting: Cardiovascular Disease

## 2018-01-29 DIAGNOSIS — I10 Essential (primary) hypertension: Secondary | ICD-10-CM | POA: Diagnosis not present

## 2018-01-29 DIAGNOSIS — Z79899 Other long term (current) drug therapy: Secondary | ICD-10-CM | POA: Diagnosis not present

## 2018-01-29 DIAGNOSIS — F419 Anxiety disorder, unspecified: Secondary | ICD-10-CM | POA: Diagnosis not present

## 2018-01-29 DIAGNOSIS — I2510 Atherosclerotic heart disease of native coronary artery without angina pectoris: Secondary | ICD-10-CM | POA: Diagnosis not present

## 2018-01-29 DIAGNOSIS — M1990 Unspecified osteoarthritis, unspecified site: Secondary | ICD-10-CM | POA: Diagnosis not present

## 2018-01-29 DIAGNOSIS — E7800 Pure hypercholesterolemia, unspecified: Secondary | ICD-10-CM | POA: Diagnosis not present

## 2018-01-29 DIAGNOSIS — Z955 Presence of coronary angioplasty implant and graft: Secondary | ICD-10-CM

## 2018-01-29 DIAGNOSIS — Z7902 Long term (current) use of antithrombotics/antiplatelets: Secondary | ICD-10-CM | POA: Diagnosis not present

## 2018-01-29 DIAGNOSIS — K219 Gastro-esophageal reflux disease without esophagitis: Secondary | ICD-10-CM | POA: Diagnosis not present

## 2018-02-01 ENCOUNTER — Encounter (HOSPITAL_COMMUNITY): Payer: Medicare HMO

## 2018-02-01 ENCOUNTER — Encounter (HOSPITAL_COMMUNITY)
Admission: RE | Admit: 2018-02-01 | Discharge: 2018-02-01 | Disposition: A | Discharge status: Home / Self Care | Payer: Medicare HMO | Source: Ambulatory Visit | Attending: Cardiovascular Disease | Admitting: Cardiovascular Disease

## 2018-02-01 DIAGNOSIS — E7800 Pure hypercholesterolemia, unspecified: Secondary | ICD-10-CM | POA: Diagnosis not present

## 2018-02-01 DIAGNOSIS — I10 Essential (primary) hypertension: Secondary | ICD-10-CM | POA: Diagnosis not present

## 2018-02-01 DIAGNOSIS — Z955 Presence of coronary angioplasty implant and graft: Secondary | ICD-10-CM

## 2018-02-01 DIAGNOSIS — I2510 Atherosclerotic heart disease of native coronary artery without angina pectoris: Secondary | ICD-10-CM | POA: Diagnosis not present

## 2018-02-01 DIAGNOSIS — Z7902 Long term (current) use of antithrombotics/antiplatelets: Secondary | ICD-10-CM | POA: Diagnosis not present

## 2018-02-01 DIAGNOSIS — K219 Gastro-esophageal reflux disease without esophagitis: Secondary | ICD-10-CM | POA: Diagnosis not present

## 2018-02-01 DIAGNOSIS — M1990 Unspecified osteoarthritis, unspecified site: Secondary | ICD-10-CM | POA: Diagnosis not present

## 2018-02-01 DIAGNOSIS — F419 Anxiety disorder, unspecified: Secondary | ICD-10-CM | POA: Diagnosis not present

## 2018-02-01 DIAGNOSIS — Z79899 Other long term (current) drug therapy: Secondary | ICD-10-CM | POA: Diagnosis not present

## 2018-02-03 ENCOUNTER — Encounter (HOSPITAL_COMMUNITY): Payer: Medicare HMO

## 2018-02-03 ENCOUNTER — Encounter (HOSPITAL_COMMUNITY)
Admission: RE | Admit: 2018-02-03 | Discharge: 2018-02-03 | Disposition: A | Discharge status: Home / Self Care | Payer: Medicare HMO | Source: Ambulatory Visit | Attending: Cardiovascular Disease | Admitting: Cardiovascular Disease

## 2018-02-03 DIAGNOSIS — K219 Gastro-esophageal reflux disease without esophagitis: Secondary | ICD-10-CM | POA: Diagnosis not present

## 2018-02-03 DIAGNOSIS — I2510 Atherosclerotic heart disease of native coronary artery without angina pectoris: Secondary | ICD-10-CM | POA: Diagnosis not present

## 2018-02-03 DIAGNOSIS — E7800 Pure hypercholesterolemia, unspecified: Secondary | ICD-10-CM | POA: Diagnosis not present

## 2018-02-03 DIAGNOSIS — Z79899 Other long term (current) drug therapy: Secondary | ICD-10-CM | POA: Diagnosis not present

## 2018-02-03 DIAGNOSIS — Z955 Presence of coronary angioplasty implant and graft: Secondary | ICD-10-CM | POA: Diagnosis not present

## 2018-02-03 DIAGNOSIS — M1990 Unspecified osteoarthritis, unspecified site: Secondary | ICD-10-CM | POA: Diagnosis not present

## 2018-02-03 DIAGNOSIS — F419 Anxiety disorder, unspecified: Secondary | ICD-10-CM | POA: Diagnosis not present

## 2018-02-03 DIAGNOSIS — I10 Essential (primary) hypertension: Secondary | ICD-10-CM | POA: Diagnosis not present

## 2018-02-03 DIAGNOSIS — Z7902 Long term (current) use of antithrombotics/antiplatelets: Secondary | ICD-10-CM | POA: Diagnosis not present

## 2018-02-04 NOTE — Progress Notes (Signed)
Cardiac Individual Treatment Plan  Patient Details  Name: [name] MRN: [id number] Date of Birth: [birth year] Referring Provider:     [facility] from 12/24/2017 in [place]  Referring Provider  Josue Hector MD       Initial Encounter Date:    CARDIAC REHAB PHASE II ORIENTATION from 12/24/2017 in [facility]  Date  12/24/17      Visit Diagnosis: 10/20/2017 Status post coronary artery stent placement  Patient's Home Medications on Admission:  Current Outpatient Medications:  .  carvedilol (COREG) 3.125 MG tablet, Take 1 tablet (3.125 mg total) by mouth 2 (two) times daily with a meal., Disp: 180 tablet, Rfl: 3 .  clopidogrel (PLAVIX) 75 MG tablet, Take 1 tablet (75 mg total) by mouth daily., Disp: 30 tablet, Rfl: 3 .  Cyanocobalamin (VITAMIN B-12) 5000 MCG LOZG, Take 5,000 Units by mouth daily., Disp: , Rfl:  .  dicyclomine (BENTYL) 20 MG tablet, Take 20 mg by mouth 2 (two) times daily after a meal. , Disp: , Rfl:  .  LORazepam (ATIVAN) 0.5 MG tablet, Take 1 tablet (0.5 mg total) by mouth every 8 (eight) hours as needed. For anxiety, Disp: 60 tablet, Rfl: 5 .  nitroGLYCERIN (NITROSTAT) 0.4 MG SL tablet, Place 0.4 mg under the tongue every 5 (five) minutes as needed for chest pain. For a max of 3 doses. If no relief, call 911., Disp: , Rfl:  .  omega-3 acid ethyl esters (LOVAZA) 1 G capsule, Take 1 g by mouth daily., Disp: , Rfl:  .  OVER THE COUNTER MEDICATION, Take 1 capsule by mouth 3 (three) times daily. Herbal supplement from Dr. Ardeth Perfect  - to strengthen intestinal lining - mixture of fruits and vegetables, Disp: , Rfl:  .  OVER THE COUNTER MEDICATION, Take 1 Scoop by mouth daily. Patriot Powder  Contains 20 fruits & vegetables, 10 probiotics and 6 digestive enzymes, Disp: , Rfl:  .  pantoprazole (PROTONIX) 40 MG tablet, Take 1 tablet (40 mg total) by mouth daily., Disp: 30 tablet, Rfl:  11 .  polyethylene glycol (MIRALAX / GLYCOLAX) packet, Take 17 g by mouth at bedtime. Mix in 8 oz water and drink, Disp: , Rfl:  .  Probiotic Product (ALIGN) 4 MG CAPS, Take 4 mg by mouth daily. , Disp: , Rfl:  .  sacubitril-valsartan (ENTRESTO) 97-103 MG, Take 1 tablet by mouth 2 (two) times daily., Disp: 60 tablet, Rfl: 11 .  vitamin C (ASCORBIC ACID) 500 MG tablet, Take 500 mg by mouth daily. , Disp: , Rfl:  .  zolpidem (AMBIEN) 10 MG tablet, Take 1 tablet (10 mg total) by mouth at bedtime as needed. For sleep (Patient taking differently: Take 5 mg by mouth at bedtime as needed for sleep. ), Disp: 30 tablet, Rfl: 5  Past Medical History: Past Medical History:  Diagnosis Date  . Anxiety   . Arthritis    "wrists" (07/27/2014)  . Coronary artery disease    a.  Pt with 4 stents over the course of 2003-2012;  b.  LHC 6/16:  LAD, RCA, LCx stents patent, OM1 50%, EF 50-55% >> med rx   . Elevated LFTs   . GERD (gastroesophageal reflux disease)   . Hypercholesterolemia   . Hypertension   . Ischemic cardiomyopathy    cMRI 03/2011:  EF 48% with dist ant, septal, apical and inf-apical dyskinesia, no apical clot, dist ant, apical and septal full thickness  scar >> No ICD needed  //  Limited echo 7/19: Large apical septal aneurysm, severe hypokinesis of the entire apex and mid to apical segments of the anterior wall, anterolateral wall and anterior septum-consistent with infarct and most of the LAD territory; EF 35   . Left ventricular aneurysm    Limited echo 7/19: Large apical septal aneurysm, severe hypokinesis of the entire apex and mid to apical segments of the anterior wall, anterolateral wall and anterior septum-consistent with infarct and most of the LAD territory; EF 35  . Myocardial infarction ([place]) 2003    Tobacco Use: Social History   Tobacco Use  Smoking Status Former Smoker  . Packs/day: 1.00  . Years: 15.00  . Pack years: 15.00  . Types: Cigarettes  Smokeless Tobacco Never Used   Tobacco Comment   "quit smoking cigarettes in 1979"    Labs: Recent Review Flowsheet Data    Labs for ITP Cardiac and Pulmonary Rehab Latest Ref Rng & Units 06/02/2007 06/03/2007 12/18/2007 01/08/2011 06/11/2012   Cholestrol 0 - 200 mg/dL - 149 ATP III CLASSIFICATION: <200     mg/dL   Desirable 200-239  mg/dL   Borderline High >=240    mg/dL   High 200 ATP III CLASSIFICATION: <200     mg/dL   Desirable 200-239  mg/dL   Borderline High >=240    mg/dL   High 167 180   LDLCALC 0 - 99 mg/dL - 97 Total Cholesterol/HDL:CHD Risk Coronary Heart Disease Risk Table Men   Women 1/2 Average Risk   3.4   3.3 118 Total Cholesterol/HDL:CHD Risk Coronary Heart Disease Risk Table Men   Women 1/2 Average Risk   3.4   3.3(H) 77 73   HDL >39 mg/dL - 31(L) 34(L) 30(L) 34(L)   Trlycerides <150 mg/dL - 106 238(H) 299(H) 367(H)   Hemoglobin A1c <5.7 % - 5.8 (NOTE)   The ADA recommends the following therapeutic goals for glycemic   control related to Hgb A1C measurement:   Goal of Therapy:   < 7.0% Hgb A1C   Action Suggested:  > 8.0% Hgb A1C   Ref:  Diabetes Care, 22, Suppl. 1, 1999 - 6.0(H) -   TCO2 - 24 - - - -      Capillary Blood Glucose: No results found for: GLUCAP   Exercise Target Goals: Exercise Program Goal: Individual exercise prescription set using results from initial 6 min walk test and THRR while considering  patient's activity barriers and safety.   Exercise Prescription Goal: Initial exercise prescription builds to 30-45 minutes a day of aerobic activity, 2-3 days per week.  Home exercise guidelines will be given to patient during program as part of exercise prescription that the participant will acknowledge.  Activity Barriers & Risk Stratification: Activity Barriers & Cardiac Risk Stratification - 12/24/17 0957      Activity Barriers & Cardiac Risk Stratification   Activity Barriers  Deconditioning    Cardiac Risk Stratification  Moderate       6 Minute Walk: 6 Minute  Walk    Row Name 12/24/17 0952         6 Minute Walk   Phase  Initial     Distance  1572 feet     Walk Time  6 minutes     # of Rest Breaks  0     MPH  2.98     METS  3.03     RPE  11     Perceived Dyspnea  0     VO2 Peak  10.6     Symptoms  No     Resting HR  79 bpm     Resting BP  122/64     Resting Oxygen Saturation   97 %     Exercise Oxygen Saturation  during 6 min walk  98 %     Max Ex. HR  101 bpm     Max Ex. BP  126/64     2 Minute Post BP  122/70        Oxygen Initial Assessment:   Oxygen Re-Evaluation:   Oxygen Discharge (Final Oxygen Re-Evaluation):   Initial Exercise Prescription: Initial Exercise Prescription - 12/24/17 1000      Date of Initial Exercise RX and Referring Provider   Date  12/24/17    Referring Provider  Josue Hector MD     Expected Discharge Date  04/02/17      Treadmill   MPH  2.3    Grade  1    Minutes  10    METs  2.99      Bike   Level  0.7    Minutes  10    METs  2.55      NuStep   Level  2    SPM  75    Minutes  10    METs  2.8      Prescription Details   Frequency (times per week)  3x    Duration  Progress to 30 minutes of continuous aerobic without signs/symptoms of physical distress      Intensity   THRR 40-80% of Max Heartrate  59-118    Ratings of Perceived Exertion  11-13    Perceived Dyspnea  0-4      Progression   Progression  Continue progressive overload as per policy without signs/symptoms or physical distress.      Resistance Training   Training Prescription  Yes    Weight  3lbs    Reps  10-15       Perform Capillary Blood Glucose checks as needed.  Exercise Prescription Changes:  Exercise Prescription Changes    Row Name 12/28/17 1454 01/04/18 1452 01/25/18 1459         Response to Exercise   Blood Pressure (Admit)  108/62  102/64  112/62     Blood Pressure (Exercise)  128/76  132/74  122/74     Blood Pressure (Exit)  112/62  108/72  114/62     Heart Rate (Admit)  80 bpm  73  bpm  94 bpm     Heart Rate (Exercise)  96 bpm  98 bpm  104 bpm     Heart Rate (Exit)  80 bpm  56 bpm  80 bpm     Rating of Perceived Exertion (Exercise)  11  12  12      Symptoms  none  none  none     Duration  Progress to 30 minutes of  aerobic without signs/symptoms of physical distress  Progress to 30 minutes of  aerobic without signs/symptoms of physical distress  Progress to 30 minutes of  aerobic without signs/symptoms of physical distress     Intensity  THRR unchanged  THRR unchanged  THRR unchanged       Progression   Progression  Continue to progress workloads to maintain intensity without signs/symptoms of physical distress.  Continue to progress workloads to maintain intensity without signs/symptoms of physical distress.  Continue to progress  workloads to maintain intensity without signs/symptoms of physical distress.     Average METs  2.6  2.8  3.4       Resistance Training   Training Prescription  Yes  Yes  Yes     Weight  3lbs  4lbs  5lbs     Reps  10-15  10-15  10-15     Time  10 Minutes  10 Minutes  10 Minutes       Interval Training   Interval Training  No  No  No       Treadmill   MPH  2.3  2.5  2.5     Grade  1  1  1      Minutes  10  10  10      METs  3.08  3.26  3.26       Bike   Level  0.7  0.7  1.1     Minutes  10  10  10      METs  2.52  2.52  3.4       NuStep   Level  2  2  4      SPM  75  75  75     Minutes  10  10  10      METs  2.3  2.6  3.4       Home Exercise Plan   Plans to continue exercise at  -  -  Home (comment) Walking at home     Frequency  -  -  Add 2 additional days to program exercise sessions.     Initial Home Exercises Provided  -  -  01/18/18        Exercise Comments:  Exercise Comments    Row Name 01/04/18 1552 01/18/18 1523 01/25/18 1514       Exercise Comments  Patient doing well with exercise, will increase workloads as tolerated.  Reviewed home exercise guidelines, METs, and goals with patient.  Reviewed METs with patient.         Exercise Goals and Review:  Exercise Goals    Row Name 12/24/17 0956             Exercise Goals   Increase Physical Activity  Yes       Intervention  Provide advice, education, support and counseling about physical activity/exercise needs.;Develop an individualized exercise prescription for aerobic and resistive training based on initial evaluation findings, risk stratification, comorbidities and participant's personal goals.       Expected Outcomes  Short Term: Attend rehab on a regular basis to increase amount of physical activity.;Long Term: Add in home exercise to make exercise part of routine and to increase amount of physical activity.;Long Term: Exercising regularly at least 3-5 days a week.       Increase Strength and Stamina  Yes       Intervention  Develop an individualized exercise prescription for aerobic and resistive training based on initial evaluation findings, risk stratification, comorbidities and participant's personal goals.;Provide advice, education, support and counseling about physical activity/exercise needs.       Expected Outcomes  Short Term: Increase workloads from initial exercise prescription for resistance, speed, and METs.;Short Term: Perform resistance training exercises routinely during rehab and add in resistance training at home;Long Term: Improve cardiorespiratory fitness, muscular endurance and strength as measured by increased METs and functional capacity (6MWT)       Able to understand and use rate of perceived exertion (RPE) scale  Yes  Intervention  Provide education and explanation on how to use RPE scale       Expected Outcomes  Short Term: Able to use RPE daily in rehab to express subjective intensity level;Long Term:  Able to use RPE to guide intensity level when exercising independently       Knowledge and understanding of Target Heart Rate Range (THRR)  Yes       Intervention  Provide education and explanation of THRR including how the  numbers were predicted and where they are located for reference       Expected Outcomes  Short Term: Able to state/look up THRR;Long Term: Able to use THRR to govern intensity when exercising independently;Short Term: Able to use daily as guideline for intensity in rehab       Understanding of Exercise Prescription  Yes       Intervention  Provide education, explanation, and written materials on patient's individual exercise prescription       Expected Outcomes  Short Term: Able to explain program exercise prescription;Long Term: Able to explain home exercise prescription to exercise independently          Exercise Goals Re-Evaluation : Exercise Goals Re-Evaluation    Row Name 01/04/18 1552 01/18/18 1523           Exercise Goal Re-Evaluation   Exercise Goals Review  Increase Physical Activity;Able to understand and use rate of perceived exertion (RPE) scale  Increase Physical Activity;Able to understand and use rate of perceived exertion (RPE) scale;Understanding of Exercise Prescription;Knowledge and understanding of Target Heart Rate Range (THRR)      Comments  Patient able to understand and use RPE scale appropriately.  Reviewed home exercise guidelines with patient including THRR, RPE scale, and endpoints for exercise. Pt walks occassionally about 10 minutes and weight training daily. Pt is amenable to increasing aerobic exercise to 30 minutes, at least 2 days/week.      Expected Outcomes  Increase workloads as tolerated to help improve cardiorespiratory fitness.  Patient will walk 30 minutes 2 days/week in addition to exercise at cardiac rehab to help strengthen heart muscle and increase stamina.         Discharge Exercise Prescription (Final Exercise Prescription Changes): Exercise Prescription Changes - 01/25/18 1459      Response to Exercise   Blood Pressure (Admit)  112/62    Blood Pressure (Exercise)  122/74    Blood Pressure (Exit)  114/62    Heart Rate (Admit)  94 bpm     Heart Rate (Exercise)  104 bpm    Heart Rate (Exit)  80 bpm    Rating of Perceived Exertion (Exercise)  12    Symptoms  none    Duration  Progress to 30 minutes of  aerobic without signs/symptoms of physical distress    Intensity  THRR unchanged      Progression   Progression  Continue to progress workloads to maintain intensity without signs/symptoms of physical distress.    Average METs  3.4      Resistance Training   Training Prescription  Yes    Weight  5lbs    Reps  10-15    Time  10 Minutes      Interval Training   Interval Training  No      Treadmill   MPH  2.5    Grade  1    Minutes  10    METs  3.26      Bike   Level  1.1  Minutes  10    METs  3.4      NuStep   Level  4    SPM  75    Minutes  10    METs  3.4      Home Exercise Plan   Plans to continue exercise at  Home (comment)   Walking at home   Frequency  Add 2 additional days to program exercise sessions.    Initial Home Exercises Provided  01/18/18       Nutrition:  Target Goals: Understanding of nutrition guidelines, daily intake of sodium 1500mg , cholesterol 200mg , calories 30% from fat and 7% or less from saturated fats, daily to have 5 or more servings of fruits and vegetables.  Biometrics: Pre Biometrics - 12/24/17 0954      Pre Biometrics   Height  5\' 6"  (1.676 m)    Weight  86.8 kg    Waist Circumference  43 inches    Hip Circumference  41.5 inches    Waist to Hip Ratio  1.04 %    BMI (Calculated)  30.9    Triceps Skinfold  21 mm    % Body Fat  31.9 %    Grip Strength  39 kg    Flexibility  0 in    Single Leg Stand  2.69 seconds        Nutrition Therapy Plan and Nutrition Goals: Nutrition Therapy & Goals - 12/24/17 1058      Nutrition Therapy   Diet  heart healthy, carb modified      Personal Nutrition Goals   Nutrition Goal  Pt to identify and limit food sources of saturated fat, trans fat, refined carbohydrates and sodium    Personal Goal #2  Pt able to name foods  that affect blood glucose.    Personal Goal #3  Pt to reduce frequency of meals eaten out      Intervention Plan   Intervention  Prescribe, educate and counsel regarding individualized specific dietary modifications aiming towards targeted core components such as weight, hypertension, lipid management, diabetes, heart failure and other comorbidities.    Expected Outcomes  Short Term Goal: Understand basic principles of dietary content, such as calories, fat, sodium, cholesterol and nutrients.;Long Term Goal: Adherence to prescribed nutrition plan.       Nutrition Assessments: Nutrition Assessments - 12/24/17 1100      MEDFICTS Scores   Pre Score  46       Nutrition Goals Re-Evaluation: Nutrition Goals Re-Evaluation    Row Name 12/24/17 1058             Goals   Current Weight  191 lb 5.8 oz (86.8 kg)          Nutrition Goals Re-Evaluation: Nutrition Goals Re-Evaluation    [place] Name 12/24/17 1058             Goals   Current Weight  191 lb 5.8 oz (86.8 kg)          Nutrition Goals Discharge (Final Nutrition Goals Re-Evaluation): Nutrition Goals Re-Evaluation - 12/24/17 1058      Goals   Current Weight  191 lb 5.8 oz (86.8 kg)       Psychosocial: Target Goals: Acknowledge presence or absence of significant depression and/or stress, maximize coping skills, provide positive support system. Participant is able to verbalize types and ability to use techniques and skills needed for reducing stress and depression.  Initial Review & Psychosocial Screening: Initial Psych Review & Screening -  12/24/17 1050      Initial Review   Current issues with  None Identified      Family Dynamics   Good Support System?  Yes   family, friends      Barriers   Psychosocial barriers to participate in program  There are no identifiable barriers or psychosocial needs.      Screening Interventions   Interventions  Encouraged to exercise       Quality of Life Scores: Quality of  Life - 12/24/17 1004      Quality of Life   Select  Quality of Life      Quality of Life Scores   Health/Function Pre  19.87 %    Socioeconomic Pre  20.5 %    Psych/Spiritual Pre  20.36 %    Family Pre  26.4 %    GLOBAL Pre  21.06 %      Scores of 19 and below usually indicate a poorer quality of life in these areas.  A difference of  2-3 points is a clinically meaningful difference.  A difference of 2-3 points in the total score of the Quality of Life Index has been associated with significant improvement in overall quality of life, self-image, physical symptoms, and general health in studies assessing change in quality of life.  PHQ-9: Recent Review Flowsheet Data    There is no flowsheet data to display.     Interpretation of Total Score  Total Score Depression Severity:  1-4 = Minimal depression, 5-9 = Mild depression, 10-14 = Moderate depression, 15-19 = Moderately severe depression, 20-27 = Severe depression   Psychosocial Evaluation and Intervention: Psychosocial Evaluation - 12/28/17 1557      Psychosocial Evaluation & Interventions   Interventions  Encouraged to exercise with the program and follow exercise prescription    Comments  no psychosocial needs identified, no interventions necessary.  However pt unable to verbalize activities that he enjoys.      Expected Outcomes  pt will exhibit positive outlook with good coping skills.     Continue Psychosocial Services   No Follow up required       Psychosocial Re-Evaluation: Psychosocial Re-Evaluation    [place] Name 01/14/18 1617 02/04/18 1548           Psychosocial Re-Evaluation   Current issues with  None Identified  None Identified      Interventions  Encouraged to attend Cardiac Rehabilitation for the exercise  Encouraged to attend Cardiac Rehabilitation for the exercise      Continue Psychosocial Services   No Follow up required  No Follow up required         Psychosocial Discharge (Final Psychosocial  Re-Evaluation): Psychosocial Re-Evaluation - 02/04/18 1548      Psychosocial Re-Evaluation   Current issues with  None Identified    Interventions  Encouraged to attend Cardiac Rehabilitation for the exercise    Continue Psychosocial Services   No Follow up required       Vocational Rehabilitation: Provide vocational rehab assistance to qualifying candidates.   Vocational Rehab Evaluation & Intervention: Vocational Rehab - 12/24/17 1050      Initial Vocational Rehab Evaluation & Intervention   Assessment shows need for Vocational Rehabilitation  No   retired Clinical biochemist      Education: Education Goals: Education classes will be provided on a weekly basis, covering required topics. Participant will state understanding/return demonstration of topics presented.  Learning Barriers/Preferences: Learning Barriers/Preferences - 12/24/17 1004      Learning  Barriers/Preferences   Learning Barriers  Sight    Learning Preferences  Skilled Demonstration;Verbal Instruction;Written Material       Education Topics: Count Your Pulse:  -Group instruction provided by verbal instruction, demonstration, patient participation and written materials to support subject.  Instructors address importance of being able to find your pulse and how to count your pulse when at home without a heart monitor.  Patients get hands on experience counting their pulse with staff help and individually.   Heart Attack, Angina, and Risk Factor Modification:  -Group instruction provided by verbal instruction, video, and written materials to support subject.  Instructors address signs and symptoms of angina and heart attacks.    Also discuss risk factors for heart disease and how to make changes to improve heart health risk factors.   Functional Fitness:  -Group instruction provided by verbal instruction, demonstration, patient participation, and written materials to support subject.  Instructors address safety  measures for doing things around the house.  Discuss how to get up and down off the floor, how to pick things up properly, how to safely get out of a chair without assistance, and balance training.   CARDIAC REHAB PHASE II EXERCISE from 01/20/2018 in [facility]  Date  01/08/18  Instruction Review Code  2- Demonstrated Understanding      Meditation and Mindfulness:  -Group instruction provided by verbal instruction, patient participation, and written materials to support subject.  Instructor addresses importance of mindfulness and meditation practice to help reduce stress and improve awareness.  Instructor also leads participants through a meditation exercise.    Stretching for Flexibility and Mobility:  -Group instruction provided by verbal instruction, patient participation, and written materials to support subject.  Instructors lead participants through series of stretches that are designed to increase flexibility thus improving mobility.  These stretches are additional exercise for major muscle groups that are typically performed during regular warm up and cool down.   Hands Only CPR:  -Group verbal, video, and participation provides a basic overview of AHA guidelines for community CPR. Role-play of emergencies allow participants the opportunity to practice calling for help and chest compression technique with discussion of AED use.   Hypertension: -Group verbal and written instruction that provides a basic overview of hypertension including the most recent diagnostic guidelines, risk factor reduction with self-care instructions and medication management.    Nutrition I class: Heart Healthy Eating:  -Group instruction provided by PowerPoint slides, verbal discussion, and written materials to support subject matter. The instructor gives an explanation and review of the Therapeutic Lifestyle Changes diet recommendations, which includes a discussion on lipid  goals, dietary fat, sodium, fiber, plant stanol/sterol esters, sugar, and the components of a well-balanced, healthy diet.   Nutrition II class: Lifestyle Skills:  -Group instruction provided by PowerPoint slides, verbal discussion, and written materials to support subject matter. The instructor gives an explanation and review of label reading, grocery shopping for heart health, heart healthy recipe modifications, and ways to make healthier choices when eating out.   Diabetes Question & Answer:  -Group instruction provided by PowerPoint slides, verbal discussion, and written materials to support subject matter. The instructor gives an explanation and review of diabetes co-morbidities, pre- and post-prandial blood glucose goals, pre-exercise blood glucose goals, signs, symptoms, and treatment of hypoglycemia and hyperglycemia, and foot care basics.   Diabetes Blitz:  -Group instruction provided by PowerPoint slides, verbal discussion, and written materials to support subject matter. The instructor gives an  explanation and review of the physiology behind type 1 and type 2 diabetes, diabetes medications and rational behind using different medications, pre- and post-prandial blood glucose recommendations and Hemoglobin A1c goals, diabetes diet, and exercise including blood glucose guidelines for exercising safely.    Portion Distortion:  -Group instruction provided by PowerPoint slides, verbal discussion, written materials, and food models to support subject matter. The instructor gives an explanation of serving size versus portion size, changes in portions sizes over the last 20 years, and what consists of a serving from each food group.   Stress Management:  -Group instruction provided by verbal instruction, video, and written materials to support subject matter.  Instructors review role of stress in heart disease and how to cope with stress positively.     Exercising on Your Own:  -Group  instruction provided by verbal instruction, power point, and written materials to support subject.  Instructors discuss benefits of exercise, components of exercise, frequency and intensity of exercise, and end points for exercise.  Also discuss use of nitroglycerin and activating EMS.  Review options of places to exercise outside of rehab.  Review guidelines for sex with heart disease.   Cardiac Drugs I:  -Group instruction provided by verbal instruction and written materials to support subject.  Instructor reviews cardiac drug classes: antiplatelets, anticoagulants, beta blockers, and statins.  Instructor discusses reasons, side effects, and lifestyle considerations for each drug class.   Cardiac Drugs II:  -Group instruction provided by verbal instruction and written materials to support subject.  Instructor reviews cardiac drug classes: angiotensin converting enzyme inhibitors (ACE-I), angiotensin II receptor blockers (ARBs), nitrates, and calcium channel blockers.  Instructor discusses reasons, side effects, and lifestyle considerations for each drug class.   Anatomy and Physiology of the Circulatory System:  Group verbal and written instruction and models provide basic cardiac anatomy and physiology, with the coronary electrical and arterial systems. Review of: AMI, Angina, Valve disease, Heart Failure, Peripheral Artery Disease, Cardiac Arrhythmia, Pacemakers, and the ICD.   CARDIAC REHAB PHASE II EXERCISE from 01/20/2018 in [place]  Date  01/20/18  Educator  Rn  Instruction Review Code  2- Demonstrated Understanding      Other Education:  -Group or individual verbal, written, or video instructions that support the educational goals of the cardiac rehab program.   Holiday Eating Survival Tips:  -Group instruction provided by PowerPoint slides, verbal discussion, and written materials to support subject matter. The instructor gives patients tips,  tricks, and techniques to help them not only survive but enjoy the holidays despite the onslaught of food that accompanies the holidays.   Knowledge Questionnaire Score: Knowledge Questionnaire Score - 12/24/17 1004      Knowledge Questionnaire Score   Pre Score  20/24       Core Components/Risk Factors/Patient Goals at Admission: Personal Goals and Risk Factors at Admission - 12/24/17 1005      Core Components/Risk Factors/Patient Goals on Admission    Weight Management  Yes;Weight Loss    Intervention  Weight Management: Develop a combined nutrition and exercise program designed to reach desired caloric intake, while maintaining appropriate intake of nutrient and fiber, sodium and fats, and appropriate energy expenditure required for the weight goal.;Weight Management: Provide education and appropriate resources to help participant work on and attain dietary goals.;Weight Management/Obesity: Establish reasonable short term and long term weight goals.;Obesity: Provide education and appropriate resources to help participant work on and attain dietary goals.    Admit Weight  191 lb 5.8 oz (86.8 kg)    Goal Weight: Long Term  180 lb (81.6 kg)    Expected Outcomes  Short Term: Continue to assess and modify interventions until short term weight is achieved;Long Term: Adherence to nutrition and physical activity/exercise program aimed toward attainment of established weight goal;Weight Loss: Understanding of general recommendations for a balanced deficit meal plan, which promotes 1-2 lb weight loss per week and includes a negative energy balance of [phone] kcal/d;Understanding recommendations for meals to include 15-35% energy as protein, 25-35% energy from fat, 35-60% energy from carbohydrates, less than 200mg  of dietary cholesterol, 20-35 gm of total fiber daily;Understanding of distribution of calorie intake throughout the day with the consumption of 4-5 meals/snacks    Hypertension  Yes     Intervention  Monitor prescription use compliance.;Provide education on lifestyle modifcations including regular physical activity/exercise, weight management, moderate sodium restriction and increased consumption of fresh fruit, vegetables, and low fat dairy, alcohol moderation, and smoking cessation.    Expected Outcomes  Short Term: Continued assessment and intervention until BP is < 140/27mm HG in hypertensive participants. < 130/62mm HG in hypertensive participants with diabetes, heart failure or chronic kidney disease.;Long Term: Maintenance of blood pressure at goal levels.    Lipids  Yes    Intervention  Provide education and support for participant on nutrition & aerobic/resistive exercise along with prescribed medications to achieve LDL 70mg , HDL >40mg .    Expected Outcomes  Short Term: Participant states understanding of desired cholesterol values and is compliant with medications prescribed. Participant is following exercise prescription and nutrition guidelines.;Long Term: Cholesterol controlled with medications as prescribed, with individualized exercise RX and with personalized nutrition plan. Value goals: LDL < 70mg , HDL > 40 mg.       Core Components/Risk Factors/Patient Goals Review:  Goals and Risk Factor Review    Row Name 01/14/18 1618 02/04/18 1549           Core Components/Risk Factors/Patient Goals Review   Personal Goals Review  Weight Management/Obesity;Lipids;Hypertension  Weight Management/Obesity;Lipids;Hypertension      Review  [name]'s vital signs have been stable at cardiac rehab. [name] has done well with exercise when in attendance  [name]'s vital signs have been stable at cardiac rehab. [name] has done well with exercise      Expected Outcomes  [name] will continue to participate in phase 2 cardiac rehab for exercise, nutrition and lifestyle modification  [name] will continue to participate in phase 2 cardiac rehab for exercise, nutrition and lifestyle modification          Core Components/Risk Factors/Patient Goals at Discharge (Final Review):  Goals and Risk Factor Review - 02/04/18 1549      Core Components/Risk Factors/Patient Goals Review   Personal Goals Review  Weight Management/Obesity;Lipids;Hypertension    Review  [name]'s vital signs have been stable at cardiac rehab. [name] has done well with exercise    Expected Outcomes  [name] will continue to participate in phase 2 cardiac rehab for exercise, nutrition and lifestyle modification       ITP Comments: ITP Comments    Row Name 12/24/17 0926 12/28/17 1556 01/14/18 1616 02/04/18 1546     ITP Comments  Dr. Fransico Him, Medical Director   pt started group exercise. pt tolerated light activity without difficulty. pt oriented to exercise equipment and safety routine. understanding verbalized.   30 Day ITP comments. [name] has been absent from cardiac rehab this week but did well when he attended last week  30  Day ITP Review. Patient with good participation and attendance in phase 2 cardiac rehab       Comments: See ITP comments.Barnet Pall, RN,BSN 02/04/2018 3:51 PM

## 2018-02-05 ENCOUNTER — Encounter (HOSPITAL_COMMUNITY)
Admission: RE | Admit: 2018-02-05 | Discharge: 2018-02-05 | Disposition: A | Discharge status: Home / Self Care | Payer: Medicare HMO | Source: Ambulatory Visit | Attending: Cardiovascular Disease | Admitting: Cardiovascular Disease

## 2018-02-05 ENCOUNTER — Encounter (HOSPITAL_COMMUNITY): Payer: Medicare HMO

## 2018-02-05 DIAGNOSIS — M1990 Unspecified osteoarthritis, unspecified site: Secondary | ICD-10-CM | POA: Diagnosis not present

## 2018-02-05 DIAGNOSIS — Z955 Presence of coronary angioplasty implant and graft: Secondary | ICD-10-CM | POA: Diagnosis not present

## 2018-02-05 DIAGNOSIS — F419 Anxiety disorder, unspecified: Secondary | ICD-10-CM | POA: Diagnosis not present

## 2018-02-05 DIAGNOSIS — Z7902 Long term (current) use of antithrombotics/antiplatelets: Secondary | ICD-10-CM | POA: Diagnosis not present

## 2018-02-05 DIAGNOSIS — K219 Gastro-esophageal reflux disease without esophagitis: Secondary | ICD-10-CM | POA: Diagnosis not present

## 2018-02-05 DIAGNOSIS — E7800 Pure hypercholesterolemia, unspecified: Secondary | ICD-10-CM | POA: Diagnosis not present

## 2018-02-05 DIAGNOSIS — Z79899 Other long term (current) drug therapy: Secondary | ICD-10-CM | POA: Diagnosis not present

## 2018-02-05 DIAGNOSIS — I10 Essential (primary) hypertension: Secondary | ICD-10-CM | POA: Diagnosis not present

## 2018-02-05 DIAGNOSIS — I2510 Atherosclerotic heart disease of native coronary artery without angina pectoris: Secondary | ICD-10-CM | POA: Diagnosis not present

## 2018-02-08 ENCOUNTER — Encounter (HOSPITAL_COMMUNITY): Payer: Medicare HMO

## 2018-02-08 ENCOUNTER — Encounter (HOSPITAL_COMMUNITY)
Admission: RE | Admit: 2018-02-08 | Discharge: 2018-02-08 | Disposition: A | Discharge status: Home / Self Care | Payer: Medicare HMO | Source: Ambulatory Visit | Attending: Cardiovascular Disease | Admitting: Cardiovascular Disease

## 2018-02-08 DIAGNOSIS — F419 Anxiety disorder, unspecified: Secondary | ICD-10-CM | POA: Diagnosis not present

## 2018-02-08 DIAGNOSIS — Z955 Presence of coronary angioplasty implant and graft: Secondary | ICD-10-CM | POA: Diagnosis not present

## 2018-02-08 DIAGNOSIS — I2510 Atherosclerotic heart disease of native coronary artery without angina pectoris: Secondary | ICD-10-CM | POA: Diagnosis not present

## 2018-02-08 DIAGNOSIS — Z7902 Long term (current) use of antithrombotics/antiplatelets: Secondary | ICD-10-CM | POA: Diagnosis not present

## 2018-02-08 DIAGNOSIS — Z79899 Other long term (current) drug therapy: Secondary | ICD-10-CM | POA: Diagnosis not present

## 2018-02-08 DIAGNOSIS — E7800 Pure hypercholesterolemia, unspecified: Secondary | ICD-10-CM | POA: Diagnosis not present

## 2018-02-08 DIAGNOSIS — I10 Essential (primary) hypertension: Secondary | ICD-10-CM | POA: Diagnosis not present

## 2018-02-08 DIAGNOSIS — M1990 Unspecified osteoarthritis, unspecified site: Secondary | ICD-10-CM | POA: Diagnosis not present

## 2018-02-08 DIAGNOSIS — K219 Gastro-esophageal reflux disease without esophagitis: Secondary | ICD-10-CM | POA: Diagnosis not present

## 2018-02-10 ENCOUNTER — Encounter (HOSPITAL_COMMUNITY): Payer: Medicare HMO

## 2018-02-10 ENCOUNTER — Encounter (HOSPITAL_COMMUNITY)
Admission: RE | Admit: 2018-02-10 | Discharge: 2018-02-10 | Disposition: A | Discharge status: Home / Self Care | Payer: Medicare HMO | Source: Ambulatory Visit | Attending: Cardiovascular Disease | Admitting: Cardiovascular Disease

## 2018-02-10 DIAGNOSIS — Z7902 Long term (current) use of antithrombotics/antiplatelets: Secondary | ICD-10-CM | POA: Diagnosis not present

## 2018-02-10 DIAGNOSIS — E7800 Pure hypercholesterolemia, unspecified: Secondary | ICD-10-CM | POA: Diagnosis not present

## 2018-02-10 DIAGNOSIS — Z955 Presence of coronary angioplasty implant and graft: Secondary | ICD-10-CM | POA: Diagnosis not present

## 2018-02-10 DIAGNOSIS — I10 Essential (primary) hypertension: Secondary | ICD-10-CM | POA: Diagnosis not present

## 2018-02-10 DIAGNOSIS — M1990 Unspecified osteoarthritis, unspecified site: Secondary | ICD-10-CM | POA: Diagnosis not present

## 2018-02-10 DIAGNOSIS — F419 Anxiety disorder, unspecified: Secondary | ICD-10-CM | POA: Diagnosis not present

## 2018-02-10 DIAGNOSIS — I2510 Atherosclerotic heart disease of native coronary artery without angina pectoris: Secondary | ICD-10-CM | POA: Diagnosis not present

## 2018-02-10 DIAGNOSIS — Z79899 Other long term (current) drug therapy: Secondary | ICD-10-CM | POA: Diagnosis not present

## 2018-02-10 DIAGNOSIS — K219 Gastro-esophageal reflux disease without esophagitis: Secondary | ICD-10-CM | POA: Diagnosis not present

## 2018-02-12 ENCOUNTER — Encounter (HOSPITAL_COMMUNITY): Payer: Medicare HMO

## 2018-02-12 ENCOUNTER — Encounter (HOSPITAL_COMMUNITY)
Admission: RE | Admit: 2018-02-12 | Discharge: 2018-02-12 | Disposition: A | Discharge status: Home / Self Care | Payer: Medicare HMO | Source: Ambulatory Visit | Attending: Cardiovascular Disease | Admitting: Cardiovascular Disease

## 2018-02-12 DIAGNOSIS — I2510 Atherosclerotic heart disease of native coronary artery without angina pectoris: Secondary | ICD-10-CM | POA: Diagnosis not present

## 2018-02-12 DIAGNOSIS — E7800 Pure hypercholesterolemia, unspecified: Secondary | ICD-10-CM | POA: Diagnosis not present

## 2018-02-12 DIAGNOSIS — I10 Essential (primary) hypertension: Secondary | ICD-10-CM | POA: Diagnosis not present

## 2018-02-12 DIAGNOSIS — Z955 Presence of coronary angioplasty implant and graft: Secondary | ICD-10-CM | POA: Diagnosis not present

## 2018-02-12 DIAGNOSIS — Z7902 Long term (current) use of antithrombotics/antiplatelets: Secondary | ICD-10-CM | POA: Diagnosis not present

## 2018-02-12 DIAGNOSIS — Z79899 Other long term (current) drug therapy: Secondary | ICD-10-CM | POA: Diagnosis not present

## 2018-02-12 DIAGNOSIS — M1990 Unspecified osteoarthritis, unspecified site: Secondary | ICD-10-CM | POA: Diagnosis not present

## 2018-02-12 DIAGNOSIS — K219 Gastro-esophageal reflux disease without esophagitis: Secondary | ICD-10-CM | POA: Diagnosis not present

## 2018-02-12 DIAGNOSIS — F419 Anxiety disorder, unspecified: Secondary | ICD-10-CM | POA: Diagnosis not present

## 2018-02-15 ENCOUNTER — Encounter (HOSPITAL_COMMUNITY)
Admission: RE | Admit: 2018-02-15 | Discharge: 2018-02-15 | Disposition: A | Discharge status: Home / Self Care | Payer: Medicare HMO | Source: Ambulatory Visit | Attending: Cardiovascular Disease | Admitting: Cardiovascular Disease

## 2018-02-15 ENCOUNTER — Encounter (HOSPITAL_COMMUNITY): Payer: Medicare HMO

## 2018-02-15 DIAGNOSIS — Z79899 Other long term (current) drug therapy: Secondary | ICD-10-CM | POA: Diagnosis not present

## 2018-02-15 DIAGNOSIS — Z7902 Long term (current) use of antithrombotics/antiplatelets: Secondary | ICD-10-CM | POA: Diagnosis not present

## 2018-02-15 DIAGNOSIS — Z955 Presence of coronary angioplasty implant and graft: Secondary | ICD-10-CM | POA: Diagnosis not present

## 2018-02-15 DIAGNOSIS — E7800 Pure hypercholesterolemia, unspecified: Secondary | ICD-10-CM | POA: Diagnosis not present

## 2018-02-15 DIAGNOSIS — K219 Gastro-esophageal reflux disease without esophagitis: Secondary | ICD-10-CM | POA: Diagnosis not present

## 2018-02-15 DIAGNOSIS — I2510 Atherosclerotic heart disease of native coronary artery without angina pectoris: Secondary | ICD-10-CM | POA: Diagnosis not present

## 2018-02-15 DIAGNOSIS — M1990 Unspecified osteoarthritis, unspecified site: Secondary | ICD-10-CM | POA: Diagnosis not present

## 2018-02-15 DIAGNOSIS — I10 Essential (primary) hypertension: Secondary | ICD-10-CM | POA: Diagnosis not present

## 2018-02-15 DIAGNOSIS — F419 Anxiety disorder, unspecified: Secondary | ICD-10-CM | POA: Diagnosis not present

## 2018-02-19 ENCOUNTER — Encounter (HOSPITAL_COMMUNITY)
Admission: RE | Admit: 2018-02-19 | Discharge: 2018-02-19 | Disposition: A | Discharge status: Home / Self Care | Payer: Medicare HMO | Source: Ambulatory Visit | Attending: Cardiovascular Disease | Admitting: Cardiovascular Disease

## 2018-02-19 ENCOUNTER — Encounter (HOSPITAL_COMMUNITY): Payer: Medicare HMO

## 2018-02-19 DIAGNOSIS — F419 Anxiety disorder, unspecified: Secondary | ICD-10-CM | POA: Diagnosis not present

## 2018-02-19 DIAGNOSIS — Z955 Presence of coronary angioplasty implant and graft: Secondary | ICD-10-CM | POA: Diagnosis not present

## 2018-02-19 DIAGNOSIS — E7800 Pure hypercholesterolemia, unspecified: Secondary | ICD-10-CM | POA: Diagnosis not present

## 2018-02-19 DIAGNOSIS — Z79899 Other long term (current) drug therapy: Secondary | ICD-10-CM | POA: Diagnosis not present

## 2018-02-19 DIAGNOSIS — K219 Gastro-esophageal reflux disease without esophagitis: Secondary | ICD-10-CM | POA: Diagnosis not present

## 2018-02-19 DIAGNOSIS — Z7902 Long term (current) use of antithrombotics/antiplatelets: Secondary | ICD-10-CM | POA: Diagnosis not present

## 2018-02-19 DIAGNOSIS — I10 Essential (primary) hypertension: Secondary | ICD-10-CM | POA: Diagnosis not present

## 2018-02-19 DIAGNOSIS — I2510 Atherosclerotic heart disease of native coronary artery without angina pectoris: Secondary | ICD-10-CM | POA: Diagnosis not present

## 2018-02-19 DIAGNOSIS — M1990 Unspecified osteoarthritis, unspecified site: Secondary | ICD-10-CM | POA: Diagnosis not present

## 2018-02-22 ENCOUNTER — Telehealth (HOSPITAL_COMMUNITY): Payer: Self-pay | Admitting: Internal Medicine

## 2018-02-22 ENCOUNTER — Encounter (HOSPITAL_COMMUNITY): Payer: Medicare HMO

## 2018-02-26 ENCOUNTER — Encounter (HOSPITAL_COMMUNITY): Payer: Medicare Other

## 2018-02-26 ENCOUNTER — Encounter (HOSPITAL_COMMUNITY)
Admission: RE | Admit: 2018-02-26 | Discharge: 2018-02-26 | Disposition: A | Discharge status: Home / Self Care | Payer: Medicare Other | Source: Ambulatory Visit | Attending: Cardiovascular Disease | Admitting: Cardiovascular Disease

## 2018-02-26 DIAGNOSIS — Z79899 Other long term (current) drug therapy: Secondary | ICD-10-CM | POA: Diagnosis not present

## 2018-02-26 DIAGNOSIS — F419 Anxiety disorder, unspecified: Secondary | ICD-10-CM | POA: Insufficient documentation

## 2018-02-26 DIAGNOSIS — E7800 Pure hypercholesterolemia, unspecified: Secondary | ICD-10-CM | POA: Diagnosis not present

## 2018-02-26 DIAGNOSIS — Z7902 Long term (current) use of antithrombotics/antiplatelets: Secondary | ICD-10-CM | POA: Insufficient documentation

## 2018-02-26 DIAGNOSIS — K219 Gastro-esophageal reflux disease without esophagitis: Secondary | ICD-10-CM | POA: Diagnosis not present

## 2018-02-26 DIAGNOSIS — I10 Essential (primary) hypertension: Secondary | ICD-10-CM | POA: Insufficient documentation

## 2018-02-26 DIAGNOSIS — I2510 Atherosclerotic heart disease of native coronary artery without angina pectoris: Secondary | ICD-10-CM | POA: Insufficient documentation

## 2018-02-26 DIAGNOSIS — M1990 Unspecified osteoarthritis, unspecified site: Secondary | ICD-10-CM | POA: Insufficient documentation

## 2018-02-26 DIAGNOSIS — I252 Old myocardial infarction: Secondary | ICD-10-CM | POA: Insufficient documentation

## 2018-02-26 DIAGNOSIS — Z87891 Personal history of nicotine dependence: Secondary | ICD-10-CM | POA: Insufficient documentation

## 2018-02-26 DIAGNOSIS — I255 Ischemic cardiomyopathy: Secondary | ICD-10-CM | POA: Diagnosis not present

## 2018-02-26 DIAGNOSIS — Z955 Presence of coronary angioplasty implant and graft: Secondary | ICD-10-CM | POA: Diagnosis not present

## 2018-03-01 ENCOUNTER — Encounter (HOSPITAL_COMMUNITY)
Admission: RE | Admit: 2018-03-01 | Discharge: 2018-03-01 | Disposition: A | Discharge status: Home / Self Care | Payer: Medicare Other | Source: Ambulatory Visit | Attending: Cardiovascular Disease | Admitting: Cardiovascular Disease

## 2018-03-01 ENCOUNTER — Encounter (HOSPITAL_COMMUNITY): Payer: Medicare Other

## 2018-03-01 DIAGNOSIS — Z955 Presence of coronary angioplasty implant and graft: Secondary | ICD-10-CM

## 2018-03-03 ENCOUNTER — Encounter (HOSPITAL_COMMUNITY)
Admission: RE | Admit: 2018-03-03 | Discharge: 2018-03-03 | Disposition: A | Discharge status: Home / Self Care | Payer: Medicare Other | Source: Ambulatory Visit | Attending: Cardiovascular Disease | Admitting: Cardiovascular Disease

## 2018-03-03 ENCOUNTER — Encounter (HOSPITAL_COMMUNITY): Payer: Medicare Other

## 2018-03-03 DIAGNOSIS — Z955 Presence of coronary angioplasty implant and graft: Secondary | ICD-10-CM

## 2018-03-04 NOTE — Progress Notes (Signed)
Cardiac Individual Treatment Plan  Patient Details  Name: [name] MRN: [id number] Date of Birth: [birth year] Referring Provider:     [facility] from 12/24/2017 in [place]  Referring Provider  Josue Hector MD       Initial Encounter Date:    CARDIAC REHAB PHASE II ORIENTATION from 12/24/2017 in [facility]  Date  12/24/17      Visit Diagnosis: 10/20/2017 Status post coronary artery stent placement  Patient's Home Medications on Admission:  Current Outpatient Medications:  .  carvedilol (COREG) 3.125 MG tablet, Take 1 tablet (3.125 mg total) by mouth 2 (two) times daily with a meal., Disp: 180 tablet, Rfl: 3 .  clopidogrel (PLAVIX) 75 MG tablet, Take 1 tablet (75 mg total) by mouth daily., Disp: 30 tablet, Rfl: 3 .  Cyanocobalamin (VITAMIN B-12) 5000 MCG LOZG, Take 5,000 Units by mouth daily., Disp: , Rfl:  .  dicyclomine (BENTYL) 20 MG tablet, Take 20 mg by mouth 2 (two) times daily after a meal. , Disp: , Rfl:  .  LORazepam (ATIVAN) 0.5 MG tablet, Take 1 tablet (0.5 mg total) by mouth every 8 (eight) hours as needed. For anxiety, Disp: 60 tablet, Rfl: 5 .  nitroGLYCERIN (NITROSTAT) 0.4 MG SL tablet, Place 0.4 mg under the tongue every 5 (five) minutes as needed for chest pain. For a max of 3 doses. If no relief, call 911., Disp: , Rfl:  .  omega-3 acid ethyl esters (LOVAZA) 1 G capsule, Take 1 g by mouth daily., Disp: , Rfl:  .  OVER THE COUNTER MEDICATION, Take 1 capsule by mouth 3 (three) times daily. Herbal supplement from Dr. Ardeth Perfect  - to strengthen intestinal lining - mixture of fruits and vegetables, Disp: , Rfl:  .  OVER THE COUNTER MEDICATION, Take 1 Scoop by mouth daily. Patriot Powder  Contains 20 fruits & vegetables, 10 probiotics and 6 digestive enzymes, Disp: , Rfl:  .  pantoprazole (PROTONIX) 40 MG tablet, Take 1 tablet (40 mg total) by mouth daily., Disp: 30 tablet, Rfl:  11 .  polyethylene glycol (MIRALAX / GLYCOLAX) packet, Take 17 g by mouth at bedtime. Mix in 8 oz water and drink, Disp: , Rfl:  .  Probiotic Product (ALIGN) 4 MG CAPS, Take 4 mg by mouth daily. , Disp: , Rfl:  .  sacubitril-valsartan (ENTRESTO) 97-103 MG, Take 1 tablet by mouth 2 (two) times daily., Disp: 60 tablet, Rfl: 11 .  vitamin C (ASCORBIC ACID) 500 MG tablet, Take 500 mg by mouth daily. , Disp: , Rfl:  .  zolpidem (AMBIEN) 10 MG tablet, Take 1 tablet (10 mg total) by mouth at bedtime as needed. For sleep (Patient taking differently: Take 5 mg by mouth at bedtime as needed for sleep. ), Disp: 30 tablet, Rfl: 5  Past Medical History: Past Medical History:  Diagnosis Date  . Anxiety   . Arthritis    "wrists" (07/27/2014)  . Coronary artery disease    a.  Pt with 4 stents over the course of 2003-2012;  b.  LHC 6/16:  LAD, RCA, LCx stents patent, OM1 50%, EF 50-55% >> med rx   . Elevated LFTs   . GERD (gastroesophageal reflux disease)   . Hypercholesterolemia   . Hypertension   . Ischemic cardiomyopathy    cMRI 03/2011:  EF 48% with dist ant, septal, apical and inf-apical dyskinesia, no apical clot, dist ant, apical and septal full thickness  scar >> No ICD needed  //  Limited echo 7/19: Large apical septal aneurysm, severe hypokinesis of the entire apex and mid to apical segments of the anterior wall, anterolateral wall and anterior septum-consistent with infarct and most of the LAD territory; EF 35   . Left ventricular aneurysm    Limited echo 7/19: Large apical septal aneurysm, severe hypokinesis of the entire apex and mid to apical segments of the anterior wall, anterolateral wall and anterior septum-consistent with infarct and most of the LAD territory; EF 35  . Myocardial infarction ([place]) 2003    Tobacco Use: Social History   Tobacco Use  Smoking Status Former Smoker  . Packs/day: 1.00  . Years: 15.00  . Pack years: 15.00  . Types: Cigarettes  Smokeless Tobacco Never Used   Tobacco Comment   "quit smoking cigarettes in 1979"    Labs: Recent Review Flowsheet Data    Labs for ITP Cardiac and Pulmonary Rehab Latest Ref Rng & Units 06/02/2007 06/03/2007 12/18/2007 01/08/2011 06/11/2012   Cholestrol 0 - 200 mg/dL - 149 ATP III CLASSIFICATION: <200     mg/dL   Desirable 200-239  mg/dL   Borderline High >=240    mg/dL   High 200 ATP III CLASSIFICATION: <200     mg/dL   Desirable 200-239  mg/dL   Borderline High >=240    mg/dL   High 167 180   LDLCALC 0 - 99 mg/dL - 97 Total Cholesterol/HDL:CHD Risk Coronary Heart Disease Risk Table Men   Women 1/2 Average Risk   3.4   3.3 118 Total Cholesterol/HDL:CHD Risk Coronary Heart Disease Risk Table Men   Women 1/2 Average Risk   3.4   3.3(H) 77 73   HDL >39 mg/dL - 31(L) 34(L) 30(L) 34(L)   Trlycerides <150 mg/dL - 106 238(H) 299(H) 367(H)   Hemoglobin A1c <5.7 % - 5.8 (NOTE)   The ADA recommends the following therapeutic goals for glycemic   control related to Hgb A1C measurement:   Goal of Therapy:   < 7.0% Hgb A1C   Action Suggested:  > 8.0% Hgb A1C   Ref:  Diabetes Care, 22, Suppl. 1, 1999 - 6.0(H) -   TCO2 - 24 - - - -      Capillary Blood Glucose: No results found for: GLUCAP   Exercise Target Goals: Exercise Program Goal: Individual exercise prescription set using results from initial 6 min walk test and THRR while considering  patient's activity barriers and safety.   Exercise Prescription Goal: Initial exercise prescription builds to 30-45 minutes a day of aerobic activity, 2-3 days per week.  Home exercise guidelines will be given to patient during program as part of exercise prescription that the participant will acknowledge.  Activity Barriers & Risk Stratification: Activity Barriers & Cardiac Risk Stratification - 12/24/17 0957      Activity Barriers & Cardiac Risk Stratification   Activity Barriers  Deconditioning    Cardiac Risk Stratification  Moderate       6 Minute Walk: 6 Minute  Walk    Row Name 12/24/17 0952         6 Minute Walk   Phase  Initial     Distance  1572 feet     Walk Time  6 minutes     # of Rest Breaks  0     MPH  2.98     METS  3.03     RPE  11     Perceived Dyspnea  0     VO2 Peak  10.6     Symptoms  No     Resting HR  79 bpm     Resting BP  122/64     Resting Oxygen Saturation   97 %     Exercise Oxygen Saturation  during 6 min walk  98 %     Max Ex. HR  101 bpm     Max Ex. BP  126/64     2 Minute Post BP  122/70        Oxygen Initial Assessment:   Oxygen Re-Evaluation:   Oxygen Discharge (Final Oxygen Re-Evaluation):   Initial Exercise Prescription: Initial Exercise Prescription - 12/24/17 1000      Date of Initial Exercise RX and Referring Provider   Date  12/24/17    Referring Provider  Josue Hector MD     Expected Discharge Date  04/02/17      Treadmill   MPH  2.3    Grade  1    Minutes  10    METs  2.99      Bike   Level  0.7    Minutes  10    METs  2.55      NuStep   Level  2    SPM  75    Minutes  10    METs  2.8      Prescription Details   Frequency (times per week)  3x    Duration  Progress to 30 minutes of continuous aerobic without signs/symptoms of physical distress      Intensity   THRR 40-80% of Max Heartrate  59-118    Ratings of Perceived Exertion  11-13    Perceived Dyspnea  0-4      Progression   Progression  Continue progressive overload as per policy without signs/symptoms or physical distress.      Resistance Training   Training Prescription  Yes    Weight  3lbs    Reps  10-15       Perform Capillary Blood Glucose checks as needed.  Exercise Prescription Changes:  Exercise Prescription Changes    Row Name 12/28/17 1454 01/04/18 1452 01/25/18 1459 02/08/18 1504 02/26/18 1505     Response to Exercise   Blood Pressure (Admit)  108/62  102/64  112/62  112/70  124/84   Blood Pressure (Exercise)  128/76  132/74  122/74  138/82  142/80   Blood Pressure (Exit)  112/62   108/72  114/62  128/62  112/70   Heart Rate (Admit)  80 bpm  73 bpm  94 bpm  90 bpm  77 bpm   Heart Rate (Exercise)  96 bpm  98 bpm  104 bpm  114 bpm  122 bpm   Heart Rate (Exit)  80 bpm  56 bpm  80 bpm  88 bpm  87 bpm   Rating of Perceived Exertion (Exercise)  '11  12  12  12  12   ' Symptoms  none  none  none  none  none   Duration  Progress to 30 minutes of  aerobic without signs/symptoms of physical distress  Progress to 30 minutes of  aerobic without signs/symptoms of physical distress  Progress to 30 minutes of  aerobic without signs/symptoms of physical distress  Progress to 30 minutes of  aerobic without signs/symptoms of physical distress  Progress to 30 minutes of  aerobic without signs/symptoms of physical distress   Intensity  THRR unchanged  THRR unchanged  THRR unchanged  THRR unchanged  THRR unchanged     Progression   Progression  Continue to progress workloads to maintain intensity without signs/symptoms of physical distress.  Continue to progress workloads to maintain intensity without signs/symptoms of physical distress.  Continue to progress workloads to maintain intensity without signs/symptoms of physical distress.  Continue to progress workloads to maintain intensity without signs/symptoms of physical distress.  Continue to progress workloads to maintain intensity without signs/symptoms of physical distress.   Average METs  2.6  2.8  3.4  4.4  4.4     Resistance Training   Training Prescription  Yes  Yes  Yes  Yes  Yes   Weight  3lbs  4lbs  5lbs  5lbs  5lbs   Reps  10-15  10-15  10-15  10-15  10-15   Time  10 Minutes  10 Minutes  10 Minutes  10 Minutes  10 Minutes     Interval Training   Interval Training  No  No  No  No  No     Treadmill   MPH  2.3  2.5  2.'5  3  3   ' Grade  '1  1  1  4  4   ' Minutes  '10  10  10  10  10   ' METs  3.08  3.26  3.26  4.95  4.95     Bike   Level  0.7  0.7  1.1  1.5  1.5   Minutes  '10  10  10  10  10   ' METs  2.52  2.52  3.4  4.26  425      NuStep   Level  '2  2  4  5  6   ' SPM  75  75  75  75  75   Minutes  '10  10  10  10  10   ' METs  2.3  2.6  3.4  3.9  4.1     Home Exercise Plan   Plans to continue exercise at  -  -  Home (comment) Walking at home  Home (comment) Walking at home  Home (comment) Walking at home   Frequency  -  -  Add 2 additional days to program exercise sessions.  Add 2 additional days to program exercise sessions.  Add 2 additional days to program exercise sessions.   Initial Home Exercises Provided  -  -  01/18/18  01/18/18  01/18/18      Exercise Comments:  Exercise Comments    Row Name 01/04/18 1552 01/18/18 1523 01/25/18 1514 02/08/18 1517 03/01/18 0910   Exercise Comments  Patient doing well with exercise, will increase workloads as tolerated.  Reviewed home exercise guidelines, METs, and goals with patient.  Reviewed METs with patient.  Reviewed METs and goals with patient.  MET level reviewed with patient on 02/26/18.      Exercise Goals and Review:  Exercise Goals    Row Name 12/24/17 0956             Exercise Goals   Increase Physical Activity  Yes       Intervention  Provide advice, education, support and counseling about physical activity/exercise needs.;Develop an individualized exercise prescription for aerobic and resistive training based on initial evaluation findings, risk stratification, comorbidities and participant's personal goals.       Expected Outcomes  Short Term: Attend rehab on a regular basis to increase amount of physical activity.;Long Term:  Add in home exercise to make exercise part of routine and to increase amount of physical activity.;Long Term: Exercising regularly at least 3-5 days a week.       Increase Strength and Stamina  Yes       Intervention  Develop an individualized exercise prescription for aerobic and resistive training based on initial evaluation findings, risk stratification, comorbidities and participant's personal goals.;Provide advice, education,  support and counseling about physical activity/exercise needs.       Expected Outcomes  Short Term: Increase workloads from initial exercise prescription for resistance, speed, and METs.;Short Term: Perform resistance training exercises routinely during rehab and add in resistance training at home;Long Term: Improve cardiorespiratory fitness, muscular endurance and strength as measured by increased METs and functional capacity (6MWT)       Able to understand and use rate of perceived exertion (RPE) scale  Yes       Intervention  Provide education and explanation on how to use RPE scale       Expected Outcomes  Short Term: Able to use RPE daily in rehab to express subjective intensity level;Long Term:  Able to use RPE to guide intensity level when exercising independently       Knowledge and understanding of Target Heart Rate Range (THRR)  Yes       Intervention  Provide education and explanation of THRR including how the numbers were predicted and where they are located for reference       Expected Outcomes  Short Term: Able to state/look up THRR;Long Term: Able to use THRR to govern intensity when exercising independently;Short Term: Able to use daily as guideline for intensity in rehab       Understanding of Exercise Prescription  Yes       Intervention  Provide education, explanation, and written materials on patient's individual exercise prescription       Expected Outcomes  Short Term: Able to explain program exercise prescription;Long Term: Able to explain home exercise prescription to exercise independently          Exercise Goals Re-Evaluation : Exercise Goals Re-Evaluation    Row Name 01/04/18 1552 01/18/18 1523 02/08/18 1517         Exercise Goal Re-Evaluation   Exercise Goals Review  Increase Physical Activity;Able to understand and use rate of perceived exertion (RPE) scale  Increase Physical Activity;Able to understand and use rate of perceived exertion (RPE) scale;Understanding of  Exercise Prescription;Knowledge and understanding of Target Heart Rate Range (THRR)  Increase Physical Activity;Able to understand and use rate of perceived exertion (RPE) scale;Understanding of Exercise Prescription;Knowledge and understanding of Target Heart Rate Range (THRR)     Comments  Patient able to understand and use RPE scale appropriately.  Reviewed home exercise guidelines with patient including THRR, RPE scale, and endpoints for exercise. Pt walks occassionally about 10 minutes and weight training daily. Pt is amenable to increasing aerobic exercise to 30 minutes, at least 2 days/week.  Patient is walking 10-15 minutes at least 5 days/week, stretching and weight training at home.     Expected Outcomes  Increase workloads as tolerated to help improve cardiorespiratory fitness.  Patient will walk 30 minutes 2 days/week in addition to exercise at cardiac rehab to help strengthen heart muscle and increase stamina.  Progressing workloads to increase strength and stamina.        Discharge Exercise Prescription (Final Exercise Prescription Changes): Exercise Prescription Changes - 02/26/18 1505      Response to Exercise  Blood Pressure (Admit)  124/84    Blood Pressure (Exercise)  142/80    Blood Pressure (Exit)  112/70    Heart Rate (Admit)  77 bpm    Heart Rate (Exercise)  122 bpm    Heart Rate (Exit)  87 bpm    Rating of Perceived Exertion (Exercise)  12    Symptoms  none    Duration  Progress to 30 minutes of  aerobic without signs/symptoms of physical distress    Intensity  THRR unchanged      Progression   Progression  Continue to progress workloads to maintain intensity without signs/symptoms of physical distress.    Average METs  4.4      Resistance Training   Training Prescription  Yes    Weight  5lbs    Reps  10-15    Time  10 Minutes      Interval Training   Interval Training  No      Treadmill   MPH  3    Grade  4    Minutes  10    METs  4.95      Bike    Level  1.5    Minutes  10    METs  425      NuStep   Level  6    SPM  75    Minutes  10    METs  4.1      Home Exercise Plan   Plans to continue exercise at  Home (comment)   Walking at home   Frequency  Add 2 additional days to program exercise sessions.    Initial Home Exercises Provided  01/18/18       Nutrition:  Target Goals: Understanding of nutrition guidelines, daily intake of sodium <1575m, cholesterol <2074m calories 30% from fat and 7% or less from saturated fats, daily to have 5 or more servings of fruits and vegetables.  Biometrics: Pre Biometrics - 12/24/17 0954      Pre Biometrics   Height  '5\' 6"'  (1.676 m)    Weight  86.8 kg    Waist Circumference  43 inches    Hip Circumference  41.5 inches    Waist to Hip Ratio  1.04 %    BMI (Calculated)  30.9    Triceps Skinfold  21 mm    % Body Fat  31.9 %    Grip Strength  39 kg    Flexibility  0 in    Single Leg Stand  2.69 seconds        Nutrition Therapy Plan and Nutrition Goals: Nutrition Therapy & Goals - 12/24/17 1058      Nutrition Therapy   Diet  heart healthy, carb modified      Personal Nutrition Goals   Nutrition Goal  Pt to identify and limit food sources of saturated fat, trans fat, refined carbohydrates and sodium    Personal Goal #2  Pt able to name foods that affect blood glucose.    Personal Goal #3  Pt to reduce frequency of meals eaten out      Intervention Plan   Intervention  Prescribe, educate and counsel regarding individualized specific dietary modifications aiming towards targeted core components such as weight, hypertension, lipid management, diabetes, heart failure and other comorbidities.    Expected Outcomes  Short Term Goal: Understand basic principles of dietary content, such as calories, fat, sodium, cholesterol and nutrients.;Long Term Goal: Adherence to prescribed nutrition plan.  Nutrition Assessments: Nutrition Assessments - 12/24/17 1100      MEDFICTS Scores    Pre Score  46       Nutrition Goals Re-Evaluation: Nutrition Goals Re-Evaluation    Row Name 12/24/17 1058             Goals   Current Weight  191 lb 5.8 oz (86.8 kg)          Nutrition Goals Re-Evaluation: Nutrition Goals Re-Evaluation    [place] Name 12/24/17 1058             Goals   Current Weight  191 lb 5.8 oz (86.8 kg)          Nutrition Goals Discharge (Final Nutrition Goals Re-Evaluation): Nutrition Goals Re-Evaluation - 12/24/17 1058      Goals   Current Weight  191 lb 5.8 oz (86.8 kg)       Psychosocial: Target Goals: Acknowledge presence or absence of significant depression and/or stress, maximize coping skills, provide positive support system. Participant is able to verbalize types and ability to use techniques and skills needed for reducing stress and depression.  Initial Review & Psychosocial Screening: Initial Psych Review & Screening - 12/24/17 1050      Initial Review   Current issues with  None Identified      Family Dynamics   Good Support System?  Yes   family, friends      Barriers   Psychosocial barriers to participate in program  There are no identifiable barriers or psychosocial needs.      Screening Interventions   Interventions  Encouraged to exercise       Quality of Life Scores: Quality of Life - 12/24/17 1004      Quality of Life   Select  Quality of Life      Quality of Life Scores   Health/Function Pre  19.87 %    Socioeconomic Pre  20.5 %    Psych/Spiritual Pre  20.36 %    Family Pre  26.4 %    GLOBAL Pre  21.06 %      Scores of 19 and below usually indicate a poorer quality of life in these areas.  A difference of  2-3 points is a clinically meaningful difference.  A difference of 2-3 points in the total score of the Quality of Life Index has been associated with significant improvement in overall quality of life, self-image, physical symptoms, and general health in studies assessing change in quality of  life.  PHQ-9: Recent Review Flowsheet Data    There is no flowsheet data to display.     Interpretation of Total Score  Total Score Depression Severity:  1-4 = Minimal depression, 5-9 = Mild depression, 10-14 = Moderate depression, 15-19 = Moderately severe depression, 20-27 = Severe depression   Psychosocial Evaluation and Intervention: Psychosocial Evaluation - 12/28/17 1557      Psychosocial Evaluation & Interventions   Interventions  Encouraged to exercise with the program and follow exercise prescription    Comments  no psychosocial needs identified, no interventions necessary.  However pt unable to verbalize activities that he enjoys.      Expected Outcomes  pt will exhibit positive outlook with good coping skills.     Continue Psychosocial Services   No Follow up required       Psychosocial Re-Evaluation: Psychosocial Re-Evaluation    [place] Name 01/14/18 1617 02/04/18 1548 03/04/18 1452         Psychosocial Re-Evaluation  Current issues with  None Identified  None Identified  None Identified     Interventions  Encouraged to attend Cardiac Rehabilitation for the exercise  Encouraged to attend Cardiac Rehabilitation for the exercise  Encouraged to attend Cardiac Rehabilitation for the exercise     Continue Psychosocial Services   No Follow up required  No Follow up required  No Follow up required        Psychosocial Discharge (Final Psychosocial Re-Evaluation): Psychosocial Re-Evaluation - 03/04/18 1452      Psychosocial Re-Evaluation   Current issues with  None Identified    Interventions  Encouraged to attend Cardiac Rehabilitation for the exercise    Continue Psychosocial Services   No Follow up required       Vocational Rehabilitation: Provide vocational rehab assistance to qualifying candidates.   Vocational Rehab Evaluation & Intervention: Vocational Rehab - 12/24/17 1050      Initial Vocational Rehab Evaluation & Intervention   Assessment shows need for  Vocational Rehabilitation  No   retired Clinical biochemist      Education: Education Goals: Education classes will be provided on a weekly basis, covering required topics. Participant will state understanding/return demonstration of topics presented.  Learning Barriers/Preferences: Learning Barriers/Preferences - 12/24/17 1004      Learning Barriers/Preferences   Learning Barriers  Sight    Learning Preferences  Skilled Demonstration;Verbal Instruction;Written Material       Education Topics: Count Your Pulse:  -Group instruction provided by verbal instruction, demonstration, patient participation and written materials to support subject.  Instructors address importance of being able to find your pulse and how to count your pulse when at home without a heart monitor.  Patients get hands on experience counting their pulse with staff help and individually.   Heart Attack, Angina, and Risk Factor Modification:  -Group instruction provided by verbal instruction, video, and written materials to support subject.  Instructors address signs and symptoms of angina and heart attacks.    Also discuss risk factors for heart disease and how to make changes to improve heart health risk factors.   Functional Fitness:  -Group instruction provided by verbal instruction, demonstration, patient participation, and written materials to support subject.  Instructors address safety measures for doing things around the house.  Discuss how to get up and down off the floor, how to pick things up properly, how to safely get out of a chair without assistance, and balance training.   CARDIAC REHAB PHASE II EXERCISE from 03/03/2018 in [facility]  Date  02/05/18  Instruction Review Code  2- Demonstrated Understanding      Meditation and Mindfulness:  -Group instruction provided by verbal instruction, patient participation, and written materials to support subject.  Instructor addresses  importance of mindfulness and meditation practice to help reduce stress and improve awareness.  Instructor also leads participants through a meditation exercise.    Stretching for Flexibility and Mobility:  -Group instruction provided by verbal instruction, patient participation, and written materials to support subject.  Instructors lead participants through series of stretches that are designed to increase flexibility thus improving mobility.  These stretches are additional exercise for major muscle groups that are typically performed during regular warm up and cool down.   Hands Only CPR:  -Group verbal, video, and participation provides a basic overview of AHA guidelines for community CPR. Role-play of emergencies allow participants the opportunity to practice calling for help and chest compression technique with discussion of AED use.   Hypertension: -  Group verbal and written instruction that provides a basic overview of hypertension including the most recent diagnostic guidelines, risk factor reduction with self-care instructions and medication management.    Nutrition I class: Heart Healthy Eating:  -Group instruction provided by PowerPoint slides, verbal discussion, and written materials to support subject matter. The instructor gives an explanation and review of the Therapeutic Lifestyle Changes diet recommendations, which includes a discussion on lipid goals, dietary fat, sodium, fiber, plant stanol/sterol esters, sugar, and the components of a well-balanced, healthy diet.   Nutrition II class: Lifestyle Skills:  -Group instruction provided by PowerPoint slides, verbal discussion, and written materials to support subject matter. The instructor gives an explanation and review of label reading, grocery shopping for heart health, heart healthy recipe modifications, and ways to make healthier choices when eating out.   Diabetes Question & Answer:  -Group instruction provided by  PowerPoint slides, verbal discussion, and written materials to support subject matter. The instructor gives an explanation and review of diabetes co-morbidities, pre- and post-prandial blood glucose goals, pre-exercise blood glucose goals, signs, symptoms, and treatment of hypoglycemia and hyperglycemia, and foot care basics.   Diabetes Blitz:  -Group instruction provided by PowerPoint slides, verbal discussion, and written materials to support subject matter. The instructor gives an explanation and review of the physiology behind type 1 and type 2 diabetes, diabetes medications and rational behind using different medications, pre- and post-prandial blood glucose recommendations and Hemoglobin A1c goals, diabetes diet, and exercise including blood glucose guidelines for exercising safely.    Portion Distortion:  -Group instruction provided by PowerPoint slides, verbal discussion, written materials, and food models to support subject matter. The instructor gives an explanation of serving size versus portion size, changes in portions sizes over the last 20 years, and what consists of a serving from each food group.   Stress Management:  -Group instruction provided by verbal instruction, video, and written materials to support subject matter.  Instructors review role of stress in heart disease and how to cope with stress positively.     Exercising on Your Own:  -Group instruction provided by verbal instruction, power point, and written materials to support subject.  Instructors discuss benefits of exercise, components of exercise, frequency and intensity of exercise, and end points for exercise.  Also discuss use of nitroglycerin and activating EMS.  Review options of places to exercise outside of rehab.  Review guidelines for sex with heart disease.   Cardiac Drugs I:  -Group instruction provided by verbal instruction and written materials to support subject.  Instructor reviews cardiac drug  classes: antiplatelets, anticoagulants, beta blockers, and statins.  Instructor discusses reasons, side effects, and lifestyle considerations for each drug class.   CARDIAC REHAB PHASE II EXERCISE from 03/03/2018 in [place]  Date  03/03/18  Instruction Review Code  2- Demonstrated Understanding      Cardiac Drugs II:  -Group instruction provided by verbal instruction and written materials to support subject.  Instructor reviews cardiac drug classes: angiotensin converting enzyme inhibitors (ACE-I), angiotensin II receptor blockers (ARBs), nitrates, and calcium channel blockers.  Instructor discusses reasons, side effects, and lifestyle considerations for each drug class.   Anatomy and Physiology of the Circulatory System:  Group verbal and written instruction and models provide basic cardiac anatomy and physiology, with the coronary electrical and arterial systems. Review of: AMI, Angina, Valve disease, Heart Failure, Peripheral Artery Disease, Cardiac Arrhythmia, Pacemakers, and the ICD.   CARDIAC REHAB PHASE II EXERCISE from  03/03/2018 in [facility]  Date  01/20/18  Educator  Rn  Instruction Review Code  2- Demonstrated Understanding      Other Education:  -Group or individual verbal, written, or video instructions that support the educational goals of the cardiac rehab program.   Holiday Eating Survival Tips:  -Group instruction provided by PowerPoint slides, verbal discussion, and written materials to support subject matter. The instructor gives patients tips, tricks, and techniques to help them not only survive but enjoy the holidays despite the onslaught of food that accompanies the holidays.   Knowledge Questionnaire Score: Knowledge Questionnaire Score - 12/24/17 1004      Knowledge Questionnaire Score   Pre Score  20/24       Core Components/Risk Factors/Patient Goals at Admission: Personal Goals and Risk  Factors at Admission - 12/24/17 1005      Core Components/Risk Factors/Patient Goals on Admission    Weight Management  Yes;Weight Loss    Intervention  Weight Management: Develop a combined nutrition and exercise program designed to reach desired caloric intake, while maintaining appropriate intake of nutrient and fiber, sodium and fats, and appropriate energy expenditure required for the weight goal.;Weight Management: Provide education and appropriate resources to help participant work on and attain dietary goals.;Weight Management/Obesity: Establish reasonable short term and long term weight goals.;Obesity: Provide education and appropriate resources to help participant work on and attain dietary goals.    Admit Weight  191 lb 5.8 oz (86.8 kg)    Goal Weight: Long Term  180 lb (81.6 kg)    Expected Outcomes  Short Term: Continue to assess and modify interventions until short term weight is achieved;Long Term: Adherence to nutrition and physical activity/exercise program aimed toward attainment of established weight goal;Weight Loss: Understanding of general recommendations for a balanced deficit meal plan, which promotes 1-2 lb weight loss per week and includes a negative energy balance of [phone] kcal/d;Understanding recommendations for meals to include 15-35% energy as protein, 25-35% energy from fat, 35-60% energy from carbohydrates, less than 250m of dietary cholesterol, 20-35 gm of total fiber daily;Understanding of distribution of calorie intake throughout the day with the consumption of 4-5 meals/snacks    Hypertension  Yes    Intervention  Monitor prescription use compliance.;Provide education on lifestyle modifcations including regular physical activity/exercise, weight management, moderate sodium restriction and increased consumption of fresh fruit, vegetables, and low fat dairy, alcohol moderation, and smoking cessation.    Expected Outcomes  Short Term: Continued assessment and  intervention until BP is < 140/987mHG in hypertensive participants. < 130/8072mG in hypertensive participants with diabetes, heart failure or chronic kidney disease.;Long Term: Maintenance of blood pressure at goal levels.    Lipids  Yes    Intervention  Provide education and support for participant on nutrition & aerobic/resistive exercise along with prescribed medications to achieve LDL <27m52mDL >40mg68m Expected Outcomes  Short Term: Participant states understanding of desired cholesterol values and is compliant with medications prescribed. Participant is following exercise prescription and nutrition guidelines.;Long Term: Cholesterol controlled with medications as prescribed, with individualized exercise RX and with personalized nutrition plan. Value goals: LDL < 27mg,57m > 40 mg.       Core Components/Risk Factors/Patient Goals Review:  Goals and Risk Factor Review    Row Name 01/14/18 1618 02/04/18 1549 03/04/18 1453         Core Components/Risk Factors/Patient Goals Review   Personal Goals Review  Weight Management/Obesity;Lipids;Hypertension  Weight  Management/Obesity;Lipids;Hypertension  Weight Management/Obesity;Lipids;Hypertension     Review  [name]'s vital signs have been stable at cardiac rehab. [name] has done well with exercise when in attendance  [name]'s vital signs have been stable at cardiac rehab. [name] has done well with exercise  [name]'s vital signs have been stable at cardiac rehab. [name] is doing well with exercise     Expected Outcomes  [name] will continue to participate in phase 2 cardiac rehab for exercise, nutrition and lifestyle modification  [name] will continue to participate in phase 2 cardiac rehab for exercise, nutrition and lifestyle modification  [name] will continue to participate in phase 2 cardiac rehab for exercise, nutrition and lifestyle modification        Core Components/Risk Factors/Patient Goals at Discharge (Final Review):  Goals and Risk Factor Review - 03/04/18  1453      Core Components/Risk Factors/Patient Goals Review   Personal Goals Review  Weight Management/Obesity;Lipids;Hypertension    Review  [name]'s vital signs have been stable at cardiac rehab. [name] is doing well with exercise    Expected Outcomes  [name] will continue to participate in phase 2 cardiac rehab for exercise, nutrition and lifestyle modification       ITP Comments: ITP Comments    Row Name 12/24/17 0926 12/28/17 1556 01/14/18 1616 02/04/18 1546 03/04/18 1451   ITP Comments  Dr. Fransico Him, Medical Director   pt started group exercise. pt tolerated light activity without difficulty. pt oriented to exercise equipment and safety routine. understanding verbalized.   30 Day ITP comments. [name] has been absent from cardiac rehab this week but did well when he attended last week  30 Day ITP Review. Patient with good participation and attendance in phase 2 cardiac rehab  30 Day ITP Review. Patient with good participation and attendance in phase 2 cardiac rehab      Comments: See ITP comments.Barnet Pall, RN,BSN 03/04/2018 2:56 PM

## 2018-03-05 ENCOUNTER — Encounter (HOSPITAL_COMMUNITY): Payer: Medicare Other

## 2018-03-05 ENCOUNTER — Encounter (HOSPITAL_COMMUNITY)
Admission: RE | Admit: 2018-03-05 | Discharge: 2018-03-05 | Disposition: A | Discharge status: Home / Self Care | Payer: Medicare Other | Source: Ambulatory Visit | Attending: Cardiovascular Disease | Admitting: Cardiovascular Disease

## 2018-03-05 DIAGNOSIS — Z955 Presence of coronary angioplasty implant and graft: Secondary | ICD-10-CM | POA: Diagnosis not present

## 2018-03-08 ENCOUNTER — Encounter (HOSPITAL_COMMUNITY)
Admission: RE | Admit: 2018-03-08 | Discharge: 2018-03-08 | Disposition: A | Discharge status: Home / Self Care | Payer: Medicare Other | Source: Ambulatory Visit | Attending: Cardiovascular Disease | Admitting: Cardiovascular Disease

## 2018-03-08 ENCOUNTER — Encounter (HOSPITAL_COMMUNITY): Payer: Medicare Other

## 2018-03-08 DIAGNOSIS — Z955 Presence of coronary angioplasty implant and graft: Secondary | ICD-10-CM

## 2018-03-10 ENCOUNTER — Encounter (HOSPITAL_COMMUNITY): Payer: Medicare Other

## 2018-03-10 ENCOUNTER — Encounter (HOSPITAL_COMMUNITY)
Admission: RE | Admit: 2018-03-10 | Discharge: 2018-03-10 | Disposition: A | Discharge status: Home / Self Care | Payer: Medicare Other | Source: Ambulatory Visit | Attending: Cardiovascular Disease | Admitting: Cardiovascular Disease

## 2018-03-10 DIAGNOSIS — Z955 Presence of coronary angioplasty implant and graft: Secondary | ICD-10-CM

## 2018-03-12 ENCOUNTER — Encounter (HOSPITAL_COMMUNITY): Payer: Medicare Other

## 2018-03-12 ENCOUNTER — Encounter (HOSPITAL_COMMUNITY)
Admission: RE | Admit: 2018-03-12 | Discharge: 2018-03-12 | Disposition: A | Discharge status: Home / Self Care | Payer: Medicare Other | Source: Ambulatory Visit | Attending: Cardiovascular Disease | Admitting: Cardiovascular Disease

## 2018-03-12 DIAGNOSIS — Z955 Presence of coronary angioplasty implant and graft: Secondary | ICD-10-CM

## 2018-03-15 ENCOUNTER — Encounter (HOSPITAL_COMMUNITY)
Admission: RE | Admit: 2018-03-15 | Discharge: 2018-03-15 | Disposition: A | Discharge status: Home / Self Care | Payer: Medicare Other | Source: Ambulatory Visit | Attending: Cardiovascular Disease | Admitting: Cardiovascular Disease

## 2018-03-15 ENCOUNTER — Encounter (HOSPITAL_COMMUNITY): Payer: Medicare Other

## 2018-03-15 DIAGNOSIS — Z955 Presence of coronary angioplasty implant and graft: Secondary | ICD-10-CM | POA: Diagnosis not present

## 2018-03-17 ENCOUNTER — Encounter (HOSPITAL_COMMUNITY)
Admission: RE | Admit: 2018-03-17 | Discharge: 2018-03-17 | Disposition: A | Discharge status: Home / Self Care | Payer: Medicare Other | Source: Ambulatory Visit | Attending: Cardiovascular Disease | Admitting: Cardiovascular Disease

## 2018-03-17 ENCOUNTER — Encounter (HOSPITAL_COMMUNITY): Payer: Medicare Other

## 2018-03-17 DIAGNOSIS — Z955 Presence of coronary angioplasty implant and graft: Secondary | ICD-10-CM

## 2018-03-19 ENCOUNTER — Encounter (HOSPITAL_COMMUNITY): Payer: Medicare Other

## 2018-03-19 ENCOUNTER — Encounter (HOSPITAL_COMMUNITY)
Admission: RE | Admit: 2018-03-19 | Discharge: 2018-03-19 | Disposition: A | Discharge status: Home / Self Care | Payer: Medicare Other | Source: Ambulatory Visit | Attending: Cardiovascular Disease | Admitting: Cardiovascular Disease

## 2018-03-19 DIAGNOSIS — Z955 Presence of coronary angioplasty implant and graft: Secondary | ICD-10-CM | POA: Diagnosis not present

## 2018-03-22 ENCOUNTER — Encounter (HOSPITAL_COMMUNITY): Payer: Medicare Other

## 2018-03-22 ENCOUNTER — Encounter (HOSPITAL_COMMUNITY)
Admission: RE | Admit: 2018-03-22 | Discharge: 2018-03-22 | Disposition: A | Discharge status: Home / Self Care | Payer: Medicare Other | Source: Ambulatory Visit | Attending: Cardiovascular Disease | Admitting: Cardiovascular Disease

## 2018-03-22 DIAGNOSIS — Z955 Presence of coronary angioplasty implant and graft: Secondary | ICD-10-CM

## 2018-03-24 ENCOUNTER — Encounter (HOSPITAL_COMMUNITY)
Admission: RE | Admit: 2018-03-24 | Discharge: 2018-03-24 | Disposition: A | Discharge status: Home / Self Care | Payer: Medicare Other | Source: Ambulatory Visit | Attending: Cardiovascular Disease | Admitting: Cardiovascular Disease

## 2018-03-24 ENCOUNTER — Encounter (HOSPITAL_COMMUNITY): Payer: Medicare Other

## 2018-03-24 DIAGNOSIS — Z955 Presence of coronary angioplasty implant and graft: Secondary | ICD-10-CM

## 2018-03-26 ENCOUNTER — Encounter (HOSPITAL_COMMUNITY): Payer: Medicare Other

## 2018-03-26 ENCOUNTER — Encounter (HOSPITAL_COMMUNITY)
Admission: RE | Admit: 2018-03-26 | Discharge: 2018-03-26 | Disposition: A | Discharge status: Home / Self Care | Payer: Medicare Other | Source: Ambulatory Visit | Attending: Cardiovascular Disease | Admitting: Cardiovascular Disease

## 2018-03-26 DIAGNOSIS — Z955 Presence of coronary angioplasty implant and graft: Secondary | ICD-10-CM

## 2018-03-29 ENCOUNTER — Encounter (HOSPITAL_COMMUNITY): Payer: Medicare Other

## 2018-03-29 ENCOUNTER — Encounter (HOSPITAL_COMMUNITY)
Admission: RE | Admit: 2018-03-29 | Discharge: 2018-03-29 | Disposition: A | Discharge status: Home / Self Care | Payer: Medicare Other | Source: Ambulatory Visit | Attending: Cardiovascular Disease | Admitting: Cardiovascular Disease

## 2018-03-29 DIAGNOSIS — K219 Gastro-esophageal reflux disease without esophagitis: Secondary | ICD-10-CM | POA: Diagnosis not present

## 2018-03-29 DIAGNOSIS — Z955 Presence of coronary angioplasty implant and graft: Secondary | ICD-10-CM | POA: Insufficient documentation

## 2018-03-29 DIAGNOSIS — M1990 Unspecified osteoarthritis, unspecified site: Secondary | ICD-10-CM | POA: Diagnosis not present

## 2018-03-29 DIAGNOSIS — I252 Old myocardial infarction: Secondary | ICD-10-CM | POA: Diagnosis not present

## 2018-03-29 DIAGNOSIS — I2510 Atherosclerotic heart disease of native coronary artery without angina pectoris: Secondary | ICD-10-CM | POA: Insufficient documentation

## 2018-03-29 DIAGNOSIS — Z87891 Personal history of nicotine dependence: Secondary | ICD-10-CM | POA: Insufficient documentation

## 2018-03-29 DIAGNOSIS — I10 Essential (primary) hypertension: Secondary | ICD-10-CM | POA: Insufficient documentation

## 2018-03-29 DIAGNOSIS — Z79899 Other long term (current) drug therapy: Secondary | ICD-10-CM | POA: Insufficient documentation

## 2018-03-29 DIAGNOSIS — F419 Anxiety disorder, unspecified: Secondary | ICD-10-CM | POA: Insufficient documentation

## 2018-03-29 DIAGNOSIS — Z7902 Long term (current) use of antithrombotics/antiplatelets: Secondary | ICD-10-CM | POA: Diagnosis not present

## 2018-03-29 DIAGNOSIS — I255 Ischemic cardiomyopathy: Secondary | ICD-10-CM | POA: Insufficient documentation

## 2018-03-29 DIAGNOSIS — E7800 Pure hypercholesterolemia, unspecified: Secondary | ICD-10-CM | POA: Diagnosis not present

## 2018-03-31 ENCOUNTER — Telehealth: Payer: Self-pay | Admitting: Cardiovascular Disease

## 2018-03-31 ENCOUNTER — Encounter (HOSPITAL_COMMUNITY)
Admission: RE | Admit: 2018-03-31 | Discharge: 2018-03-31 | Disposition: A | Discharge status: Home / Self Care | Payer: Medicare Other | Source: Ambulatory Visit | Attending: Cardiovascular Disease | Admitting: Cardiovascular Disease

## 2018-03-31 ENCOUNTER — Encounter (HOSPITAL_COMMUNITY): Payer: Medicare Other

## 2018-03-31 DIAGNOSIS — Z955 Presence of coronary angioplasty implant and graft: Secondary | ICD-10-CM | POA: Diagnosis not present

## 2018-03-31 NOTE — Telephone Encounter (Signed)
New message      1. What dental office are you calling from? Dr Burnard Bunting   2. What is your office phone number? [phone]   3. What is your fax number? Don't know  4. What type of procedure is the patient having performed?  Cleaning--pt not sure if he is having any other dental work   5. What date is procedure scheduled or is the patient there now?  04-21-18 (if the patient is at the dentist's office question goes to their cardiologist if he/she is in the office.  If not, question should go to the DOD).   What is your question (ex. Antibiotics prior to procedure, holding medication-we need to know how long dentist wants pt to hold med)?  Patient want to know if he should take an antibiotic 2 hrs prior to dental appt.  He states that he did this in the past but his PCP said that he could stop antibiotic.  Pt states that the dentist called in an antibiotic but he did not pick it up---waiting to hear from Dr Johnsie Cancel.  Please advise.

## 2018-03-31 NOTE — Telephone Encounter (Signed)
   [facility] Pre-operative Risk Assessment    Request for surgical clearance:  1. What type of surgery is being performed? DENTAL CLEANING WITH POSSIBLE RESTORATIVE DENTAL WORK   2. When is this surgery scheduled? 04/21/18   3. What type of clearance is required (medical clearance vs. Pharmacy clearance to hold med vs. Both)? MEDICAL  4. Are there any medications that need to be held prior to surgery and how long?NO MEDS LISTED; DR. Burnard Bunting IS ASKING IF PT NEEDS SBE    5. Practice name and name of physician performing surgery? DR. Burnard Bunting   6. What is your office phone number [phone]    7.   What is your office fax number [phone]  8.   Anesthesia type (None, local, MAC, general) ? LOCAL W/O EPINEPHRINE    Julaine Hua 03/31/2018, 4:50 PM  _________________________________________________________________   (provider comments below)

## 2018-04-01 NOTE — Progress Notes (Signed)
Cardiac Individual Treatment Plan  Patient Details  Name: [name] MRN: [id number] Date of Birth: June 09, 1945 Referring Provider:     [facility] from 12/24/2017 in [place]  Referring Provider  Josue Hector MD       Initial Encounter Date:    CARDIAC REHAB PHASE II ORIENTATION from 12/24/2017 in [place]  Date  12/24/17      Visit Diagnosis: 10/20/2017 Status post coronary artery stent placement  Patient's Home Medications on Admission:  Current Outpatient Medications:  .  carvedilol (COREG) 3.125 MG tablet, Take 1 tablet (3.125 mg total) by mouth 2 (two) times daily with a meal., Disp: 180 tablet, Rfl: 3 .  clopidogrel (PLAVIX) 75 MG tablet, Take 1 tablet (75 mg total) by mouth daily., Disp: 30 tablet, Rfl: 3 .  Cyanocobalamin (VITAMIN B-12) 5000 MCG LOZG, Take 5,000 Units by mouth daily., Disp: , Rfl:  .  dicyclomine (BENTYL) 20 MG tablet, Take 20 mg by mouth 2 (two) times daily after a meal. , Disp: , Rfl:  .  LORazepam (ATIVAN) 0.5 MG tablet, Take 1 tablet (0.5 mg total) by mouth every 8 (eight) hours as needed. For anxiety, Disp: 60 tablet, Rfl: 5 .  nitroGLYCERIN (NITROSTAT) 0.4 MG SL tablet, Place 0.4 mg under the tongue every 5 (five) minutes as needed for chest pain. For a max of 3 doses. If no relief, call 911., Disp: , Rfl:  .  omega-3 acid ethyl esters (LOVAZA) 1 G capsule, Take 1 g by mouth daily., Disp: , Rfl:  .  OVER THE COUNTER MEDICATION, Take 1 capsule by mouth 3 (three) times daily. Herbal supplement from Dr. Ardeth Perfect  - to strengthen intestinal lining - mixture of fruits and vegetables, Disp: , Rfl:  .  OVER THE COUNTER MEDICATION, Take 1 Scoop by mouth daily. Patriot Powder  Contains 20 fruits & vegetables, 10 probiotics and 6 digestive enzymes, Disp: , Rfl:  .  pantoprazole (PROTONIX) 40 MG tablet, Take 1 tablet (40 mg total) by mouth daily., Disp: 30 tablet, Rfl:  11 .  polyethylene glycol (MIRALAX / GLYCOLAX) packet, Take 17 g by mouth at bedtime. Mix in 8 oz water and drink, Disp: , Rfl:  .  Probiotic Product (ALIGN) 4 MG CAPS, Take 4 mg by mouth daily. , Disp: , Rfl:  .  sacubitril-valsartan (ENTRESTO) 97-103 MG, Take 1 tablet by mouth 2 (two) times daily., Disp: 60 tablet, Rfl: 11 .  vitamin C (ASCORBIC ACID) 500 MG tablet, Take 500 mg by mouth daily. , Disp: , Rfl:  .  zolpidem (AMBIEN) 10 MG tablet, Take 1 tablet (10 mg total) by mouth at bedtime as needed. For sleep (Patient taking differently: Take 5 mg by mouth at bedtime as needed for sleep. ), Disp: 30 tablet, Rfl: 5  Past Medical History: Past Medical History:  Diagnosis Date  . Anxiety   . Arthritis    "wrists" (07/27/2014)  . Coronary artery disease    a.  Pt with 4 stents over the course of 2003-2012;  b.  LHC 6/16:  LAD, RCA, LCx stents patent, OM1 50%, EF 50-55% >> med rx   . Elevated LFTs   . GERD (gastroesophageal reflux disease)   . Hypercholesterolemia   . Hypertension   . Ischemic cardiomyopathy    cMRI 03/2011:  EF 48% with dist ant, septal, apical and inf-apical dyskinesia, no apical clot, dist ant, apical and septal full thickness  scar >> No ICD needed  //  Limited echo 7/19: Large apical septal aneurysm, severe hypokinesis of the entire apex and mid to apical segments of the anterior wall, anterolateral wall and anterior septum-consistent with infarct and most of the LAD territory; EF 35   . Left ventricular aneurysm    Limited echo 7/19: Large apical septal aneurysm, severe hypokinesis of the entire apex and mid to apical segments of the anterior wall, anterolateral wall and anterior septum-consistent with infarct and most of the LAD territory; EF 35  . Myocardial infarction ([place]) 2003    Tobacco Use: Social History   Tobacco Use  Smoking Status Former Smoker  . Packs/day: 1.00  . Years: 15.00  . Pack years: 15.00  . Types: Cigarettes  Smokeless Tobacco Never Used   Tobacco Comment   "quit smoking cigarettes in 1979"    Labs: Recent Review Flowsheet Data    Labs for ITP Cardiac and Pulmonary Rehab Latest Ref Rng & Units 06/02/2007 06/03/2007 12/18/2007 01/08/2011 06/11/2012   Cholestrol 0 - 200 mg/dL - 149 ATP III CLASSIFICATION: <200     mg/dL   Desirable 200-239  mg/dL   Borderline High >=240    mg/dL   High 200 ATP III CLASSIFICATION: <200     mg/dL   Desirable 200-239  mg/dL   Borderline High >=240    mg/dL   High 167 180   LDLCALC 0 - 99 mg/dL - 97 Total Cholesterol/HDL:CHD Risk Coronary Heart Disease Risk Table Men   Women 1/2 Average Risk   3.4   3.3 118 Total Cholesterol/HDL:CHD Risk Coronary Heart Disease Risk Table Men   Women 1/2 Average Risk   3.4   3.3(H) 77 73   HDL >39 mg/dL - 31(L) 34(L) 30(L) 34(L)   Trlycerides <150 mg/dL - 106 238(H) 299(H) 367(H)   Hemoglobin A1c <5.7 % - 5.8 (NOTE)   The ADA recommends the following therapeutic goals for glycemic   control related to Hgb A1C measurement:   Goal of Therapy:   < 7.0% Hgb A1C   Action Suggested:  > 8.0% Hgb A1C   Ref:  Diabetes Care, 22, Suppl. 1, 1999 - 6.0(H) -   TCO2 - 24 - - - -      Capillary Blood Glucose: No results found for: GLUCAP   Exercise Target Goals: Exercise Program Goal: Individual exercise prescription set using results from initial 6 min walk test and THRR while considering  patient's activity barriers and safety.   Exercise Prescription Goal: Initial exercise prescription builds to 30-45 minutes a day of aerobic activity, 2-3 days per week.  Home exercise guidelines will be given to patient during program as part of exercise prescription that the participant will acknowledge.  Activity Barriers & Risk Stratification: Activity Barriers & Cardiac Risk Stratification - 12/24/17 0957      Activity Barriers & Cardiac Risk Stratification   Activity Barriers  Deconditioning    Cardiac Risk Stratification  Moderate       6 Minute Walk: 6 Minute  Walk    Row Name 12/24/17 0952 03/22/18 1511       6 Minute Walk   Phase  Initial  Discharge    Distance  1572 feet  1857 feet    Distance % Change  -  18.13 %    Walk Time  6 minutes  6 minutes    # of Rest Breaks  0  0    MPH  2.98  3.52  METS  3.03  3.76    RPE  11  10    Perceived Dyspnea   0  0    VO2 Peak  10.6  13.14    Symptoms  No  No    Resting HR  79 bpm  95 bpm    Resting BP  122/64  124/70    Resting Oxygen Saturation   97 %  -    Exercise Oxygen Saturation  during 6 min walk  98 %  -    Max Ex. HR  101 bpm  118 bpm    Max Ex. BP  126/64  138/78    2 Minute Post BP  122/70  124/68       Oxygen Initial Assessment:   Oxygen Re-Evaluation:   Oxygen Discharge (Final Oxygen Re-Evaluation):   Initial Exercise Prescription: Initial Exercise Prescription - 12/24/17 1000      Date of Initial Exercise RX and Referring Provider   Date  12/24/17    Referring Provider  Josue Hector MD     Expected Discharge Date  04/02/17      Treadmill   MPH  2.3    Grade  1    Minutes  10    METs  2.99      Bike   Level  0.7    Minutes  10    METs  2.55      NuStep   Level  2    SPM  75    Minutes  10    METs  2.8      Prescription Details   Frequency (times per week)  3x    Duration  Progress to 30 minutes of continuous aerobic without signs/symptoms of physical distress      Intensity   THRR 40-80% of Max Heartrate  59-118    Ratings of Perceived Exertion  11-13    Perceived Dyspnea  0-4      Progression   Progression  Continue progressive overload as per policy without signs/symptoms or physical distress.      Resistance Training   Training Prescription  Yes    Weight  3lbs    Reps  10-15       Perform Capillary Blood Glucose checks as needed.  Exercise Prescription Changes: Exercise Prescription Changes    Row Name 12/28/17 1454 01/04/18 1452 01/25/18 1459 02/08/18 1504 02/26/18 1505     Response to Exercise   Blood Pressure (Admit)   108/62  102/64  112/62  112/70  124/84   Blood Pressure (Exercise)  128/76  132/74  122/74  138/82  142/80   Blood Pressure (Exit)  112/62  108/72  114/62  128/62  112/70   Heart Rate (Admit)  80 bpm  73 bpm  94 bpm  90 bpm  77 bpm   Heart Rate (Exercise)  96 bpm  98 bpm  104 bpm  114 bpm  122 bpm   Heart Rate (Exit)  80 bpm  56 bpm  80 bpm  88 bpm  87 bpm   Rating of Perceived Exertion (Exercise)  '11  12  12  12  12   ' Symptoms  none  none  none  none  none   Duration  Progress to 30 minutes of  aerobic without signs/symptoms of physical distress  Progress to 30 minutes of  aerobic without signs/symptoms of physical distress  Progress to 30 minutes of  aerobic without signs/symptoms of physical distress  Progress to 30 minutes of  aerobic without signs/symptoms of physical distress  Progress to 30 minutes of  aerobic without signs/symptoms of physical distress   Intensity  THRR unchanged  THRR unchanged  THRR unchanged  THRR unchanged  THRR unchanged     Progression   Progression  Continue to progress workloads to maintain intensity without signs/symptoms of physical distress.  Continue to progress workloads to maintain intensity without signs/symptoms of physical distress.  Continue to progress workloads to maintain intensity without signs/symptoms of physical distress.  Continue to progress workloads to maintain intensity without signs/symptoms of physical distress.  Continue to progress workloads to maintain intensity without signs/symptoms of physical distress.   Average METs  2.6  2.8  3.4  4.4  4.4     Resistance Training   Training Prescription  Yes  Yes  Yes  Yes  Yes   Weight  3lbs  4lbs  5lbs  5lbs  5lbs   Reps  10-15  10-15  10-15  10-15  10-15   Time  10 Minutes  10 Minutes  10 Minutes  10 Minutes  10 Minutes     Interval Training   Interval Training  No  No  No  No  No     Treadmill   MPH  2.3  2.5  2.'5  3  3   ' Grade  '1  1  1  4  4   ' Minutes  '10  10  10  10  10   ' METs  3.08   3.26  3.26  4.95  4.95     Bike   Level  0.7  0.7  1.1  1.5  1.5   Minutes  '10  10  10  10  10   ' METs  2.52  2.52  3.4  4.26  425     NuStep   Level  '2  2  4  5  6   ' SPM  75  75  75  75  75   Minutes  '10  10  10  10  10   ' METs  2.3  2.6  3.4  3.9  4.1     Home Exercise Plan   Plans to continue exercise at  -  -  Home (comment) Walking at home  Home (comment) Walking at home  Home (comment) Walking at home   Frequency  -  -  Add 2 additional days to program exercise sessions.  Add 2 additional days to program exercise sessions.  Add 2 additional days to program exercise sessions.   Initial Home Exercises Provided  -  -  01/18/18  01/18/18  01/18/18   Row Name 03/08/18 1451 03/22/18 1449           Response to Exercise   Blood Pressure (Admit)  128/70  124/70      Blood Pressure (Exercise)  118/84  138/78      Blood Pressure (Exit)  108/64  124/68      Heart Rate (Admit)  75 bpm  95 bpm      Heart Rate (Exercise)  119 bpm  131 bpm      Heart Rate (Exit)  85 bpm  95 bpm      Rating of Perceived Exertion (Exercise)  12  12      Symptoms  none  none      Duration  Progress to 30 minutes of  aerobic without signs/symptoms of physical distress  Progress  to 30 minutes of  aerobic without signs/symptoms of physical distress      Intensity  THRR unchanged  THRR unchanged        Progression   Progression  Continue to progress workloads to maintain intensity without signs/symptoms of physical distress.  Continue to progress workloads to maintain intensity without signs/symptoms of physical distress.      Average METs  4.8  4.4        Resistance Training   Training Prescription  Yes  Yes      Weight  5lbs  5lbs      Reps  10-15  10-15      Time  10 Minutes  10 Minutes        Interval Training   Interval Training  No  No        Treadmill   MPH  3.5  -      Grade  4  -      Minutes  10  -      METs  5.61  -        Bike   Level  1.7  1.7      Minutes  10  10      METs  4.7   4.64        NuStep   Level  6  7      SPM  75  85      Minutes  10  10      METs  4.1  4.8        Track   Laps  -  9 walk test: 1867f       Minutes  -  6      METs  -  3.69        Home Exercise Plan   Plans to continue exercise at  Home (comment) Walking at home  Home (comment) Walking at home      Frequency  Add 2 additional days to program exercise sessions.  Add 2 additional days to program exercise sessions.      Initial Home Exercises Provided  01/18/18  01/18/18         Exercise Comments: Exercise Comments    Row Name 01/04/18 1552 01/18/18 1523 01/25/18 1514 02/08/18 1517 03/01/18 0910   Exercise Comments  Patient doing well with exercise, will increase workloads as tolerated.  Reviewed home exercise guidelines, METs, and goals with patient.  Reviewed METs with patient.  Reviewed METs and goals with patient.  MET level reviewed with patient on 02/26/18.   RHitchcockName 03/08/18 1457 03/22/18 1520         Exercise Comments  Reviewed METs and goals with patient.  Reviewed METs and goals with patient.         Exercise Goals and Review: Exercise Goals    Row Name 12/24/17 0956             Exercise Goals   Increase Physical Activity  Yes       Intervention  Provide advice, education, support and counseling about physical activity/exercise needs.;Develop an individualized exercise prescription for aerobic and resistive training based on initial evaluation findings, risk stratification, comorbidities and participant's personal goals.       Expected Outcomes  Short Term: Attend rehab on a regular basis to increase amount of physical activity.;Long Term: Add in home exercise to make exercise part of routine and to increase amount of physical activity.;Long Term: Exercising regularly at least 3-5  days a week.       Increase Strength and Stamina  Yes       Intervention  Develop an individualized exercise prescription for aerobic and resistive training based on initial evaluation  findings, risk stratification, comorbidities and participant's personal goals.;Provide advice, education, support and counseling about physical activity/exercise needs.       Expected Outcomes  Short Term: Increase workloads from initial exercise prescription for resistance, speed, and METs.;Short Term: Perform resistance training exercises routinely during rehab and add in resistance training at home;Long Term: Improve cardiorespiratory fitness, muscular endurance and strength as measured by increased METs and functional capacity (6MWT)       Able to understand and use rate of perceived exertion (RPE) scale  Yes       Intervention  Provide education and explanation on how to use RPE scale       Expected Outcomes  Short Term: Able to use RPE daily in rehab to express subjective intensity level;Long Term:  Able to use RPE to guide intensity level when exercising independently       Knowledge and understanding of Target Heart Rate Range (THRR)  Yes       Intervention  Provide education and explanation of THRR including how the numbers were predicted and where they are located for reference       Expected Outcomes  Short Term: Able to state/look up THRR;Long Term: Able to use THRR to govern intensity when exercising independently;Short Term: Able to use daily as guideline for intensity in rehab       Understanding of Exercise Prescription  Yes       Intervention  Provide education, explanation, and written materials on patient's individual exercise prescription       Expected Outcomes  Short Term: Able to explain program exercise prescription;Long Term: Able to explain home exercise prescription to exercise independently          Exercise Goals Re-Evaluation : Exercise Goals Re-Evaluation    Row Name 01/04/18 1552 01/18/18 1523 02/08/18 1517 03/08/18 1457 03/22/18 1520     Exercise Goal Re-Evaluation   Exercise Goals Review  Increase Physical Activity;Able to understand and use rate of perceived  exertion (RPE) scale  Increase Physical Activity;Able to understand and use rate of perceived exertion (RPE) scale;Understanding of Exercise Prescription;Knowledge and understanding of Target Heart Rate Range (THRR)  Increase Physical Activity;Able to understand and use rate of perceived exertion (RPE) scale;Understanding of Exercise Prescription;Knowledge and understanding of Target Heart Rate Range (THRR)  Increase Physical Activity;Able to understand and use rate of perceived exertion (RPE) scale;Understanding of Exercise Prescription;Knowledge and understanding of Target Heart Rate Range (THRR)  Increase Physical Activity;Able to understand and use rate of perceived exertion (RPE) scale;Understanding of Exercise Prescription;Knowledge and understanding of Target Heart Rate Range (THRR);Increase Strength and Stamina   Comments  Patient able to understand and use RPE scale appropriately.  Reviewed home exercise guidelines with patient including THRR, RPE scale, and endpoints for exercise. Pt walks occassionally about 10 minutes and weight training daily. Pt is amenable to increasing aerobic exercise to 30 minutes, at least 2 days/week.  Patient is walking 10-15 minutes at least 5 days/week, stretching and weight training at home.  Patient making good progress with exercise at cardiac rehab. Pt is walking most days, weather permitting. Pt is also stretching and weight training at home.  Patient's functional capacity improved 18% as measure by 6 minute walk test. Strength improved 19% as measure by the grip strength test.  Pt is exercising daily and plans to continue this routine upon completion the cardiac rehab program.   Expected Outcomes  Increase workloads as tolerated to help improve cardiorespiratory fitness.  Patient will walk 30 minutes 2 days/week in addition to exercise at cardiac rehab to help strengthen heart muscle and increase stamina.  Progressing workloads to increase strength and stamina.   Continue current exercise prescription to help improve cardiorespiratory fitness.  Patient will exercise at least 30 minutes daily to help achieve personal health and fitness goals.      Discharge Exercise Prescription (Final Exercise Prescription Changes): Exercise Prescription Changes - 03/22/18 1449      Response to Exercise   Blood Pressure (Admit)  124/70    Blood Pressure (Exercise)  138/78    Blood Pressure (Exit)  124/68    Heart Rate (Admit)  95 bpm    Heart Rate (Exercise)  131 bpm    Heart Rate (Exit)  95 bpm    Rating of Perceived Exertion (Exercise)  12    Symptoms  none    Duration  Progress to 30 minutes of  aerobic without signs/symptoms of physical distress    Intensity  THRR unchanged      Progression   Progression  Continue to progress workloads to maintain intensity without signs/symptoms of physical distress.    Average METs  4.4      Resistance Training   Training Prescription  Yes    Weight  5lbs    Reps  10-15    Time  10 Minutes      Interval Training   Interval Training  No      Treadmill   MPH  --    Grade  --    Minutes  --    METs  --      Bike   Level  1.7    Minutes  10    METs  4.64      NuStep   Level  7    SPM  85    Minutes  10    METs  4.8      Track   Laps  9   walk test: 1891f    Minutes  6    METs  3.69      Home Exercise Plan   Plans to continue exercise at  Home (comment)   Walking at home   Frequency  Add 2 additional days to program exercise sessions.    Initial Home Exercises Provided  01/18/18       Nutrition:  Target Goals: Understanding of nutrition guidelines, daily intake of sodium <15051m cholesterol <20036mcalories 30% from fat and 7% or less from saturated fats, daily to have 5 or more servings of fruits and vegetables.  Biometrics: Pre Biometrics - 12/24/17 0954      Pre Biometrics   Height  '5\' 6"'  (1.676 m)    Weight  86.8 kg    Waist Circumference  43 inches    Hip Circumference  41.5  inches    Waist to Hip Ratio  1.04 %    BMI (Calculated)  30.9    Triceps Skinfold  21 mm    % Body Fat  31.9 %    Grip Strength  39 kg    Flexibility  0 in    Single Leg Stand  2.69 seconds      Post Biometrics - 03/22/18 [id number]  Waist Circumference  40.75 inches    Hip Circumference  40 inches    Waist to Hip Ratio  1.02 %    Triceps Skinfold  10 mm    Grip Strength  46.5 kg    Flexibility  0 in    Single Leg Stand  30 seconds       Nutrition Therapy Plan and Nutrition Goals: Nutrition Therapy & Goals - 12/24/17 1058      Nutrition Therapy   Diet  heart healthy, carb modified      Personal Nutrition Goals   Nutrition Goal  Pt to identify and limit food sources of saturated fat, trans fat, refined carbohydrates and sodium    Personal Goal #2  Pt able to name foods that affect blood glucose.    Personal Goal #3  Pt to reduce frequency of meals eaten out      Intervention Plan   Intervention  Prescribe, educate and counsel regarding individualized specific dietary modifications aiming towards targeted core components such as weight, hypertension, lipid management, diabetes, heart failure and other comorbidities.    Expected Outcomes  Short Term Goal: Understand basic principles of dietary content, such as calories, fat, sodium, cholesterol and nutrients.;Long Term Goal: Adherence to prescribed nutrition plan.       Nutrition Assessments: Nutrition Assessments - 03/29/18 1037      MEDFICTS Scores   Pre Score  46    Post Score  25    Score Difference  -21       Nutrition Goals Re-Evaluation: Nutrition Goals Re-Evaluation    [place] Name 12/24/17 1058             Goals   Current Weight  191 lb 5.8 oz (86.8 kg)          Nutrition Goals Re-Evaluation: Nutrition Goals Re-Evaluation    [place] Name 12/24/17 1058             Goals   Current Weight  191 lb 5.8 oz (86.8 kg)          Nutrition Goals Discharge (Final Nutrition Goals  Re-Evaluation): Nutrition Goals Re-Evaluation - 12/24/17 1058      Goals   Current Weight  191 lb 5.8 oz (86.8 kg)       Psychosocial: Target Goals: Acknowledge presence or absence of significant depression and/or stress, maximize coping skills, provide positive support system. Participant is able to verbalize types and ability to use techniques and skills needed for reducing stress and depression.  Initial Review & Psychosocial Screening: Initial Psych Review & Screening - 12/24/17 1050      Initial Review   Current issues with  None Identified      Family Dynamics   Good Support System?  Yes   family, friends      Barriers   Psychosocial barriers to participate in program  There are no identifiable barriers or psychosocial needs.      Screening Interventions   Interventions  Encouraged to exercise       Quality of Life Scores: Quality of Life - 03/29/18 0936      Quality of Life   Select  Quality of Life      Quality of Life Scores   Health/Function Pre  19.87 %    Health/Function Post  26.4 %    Health/Function % Change  32.86 %    Socioeconomic Pre  20.5 %    Socioeconomic Post  24 %    Socioeconomic % Change  17.07 %    Psych/Spiritual Pre  20.36 %    Psych/Spiritual Post  23.79 %    Psych/Spiritual % Change  16.85 %    Family Pre  26.4 %    Family Post  19.2 %    Family % Change  -27.27 %    GLOBAL Pre  21.06 %    GLOBAL Post  24.31 %    GLOBAL % Change  15.43 %      Scores of 19 and below usually indicate a poorer quality of life in these areas.  A difference of  2-3 points is a clinically meaningful difference.  A difference of 2-3 points in the total score of the Quality of Life Index has been associated with significant improvement in overall quality of life, self-image, physical symptoms, and general health in studies assessing change in quality of life.  PHQ-9: Recent Review Flowsheet Data    There is no flowsheet data to display.      Interpretation of Total Score  Total Score Depression Severity:  1-4 = Minimal depression, 5-9 = Mild depression, 10-14 = Moderate depression, 15-19 = Moderately severe depression, 20-27 = Severe depression   Psychosocial Evaluation and Intervention: Psychosocial Evaluation - 12/28/17 1557      Psychosocial Evaluation & Interventions   Interventions  Encouraged to exercise with the program and follow exercise prescription    Comments  no psychosocial needs identified, no interventions necessary.  However pt unable to verbalize activities that he enjoys.      Expected Outcomes  pt will exhibit positive outlook with good coping skills.     Continue Psychosocial Services   No Follow up required       Psychosocial Re-Evaluation: Psychosocial Re-Evaluation    [place] Name 02/04/18 1548 03/04/18 1452 04/01/18 1508         Psychosocial Re-Evaluation   Current issues with  None Identified  None Identified  None Identified     Interventions  Encouraged to attend Cardiac Rehabilitation for the exercise  Encouraged to attend Cardiac Rehabilitation for the exercise  Encouraged to attend Cardiac Rehabilitation for the exercise     Continue Psychosocial Services   No Follow up required  No Follow up required  No Follow up required        Psychosocial Discharge (Final Psychosocial Re-Evaluation): Psychosocial Re-Evaluation - 04/01/18 1508      Psychosocial Re-Evaluation   Current issues with  None Identified    Interventions  Encouraged to attend Cardiac Rehabilitation for the exercise    Continue Psychosocial Services   No Follow up required       Vocational Rehabilitation: Provide vocational rehab assistance to qualifying candidates.   Vocational Rehab Evaluation & Intervention: Vocational Rehab - 12/24/17 1050      Initial Vocational Rehab Evaluation & Intervention   Assessment shows need for Vocational Rehabilitation  No   retired Clinical biochemist      Education: Education Goals:  Education classes will be provided on a weekly basis, covering required topics. Participant will state understanding/return demonstration of topics presented.  Learning Barriers/Preferences: Learning Barriers/Preferences - 12/24/17 1004      Learning Barriers/Preferences   Learning Barriers  Sight    Learning Preferences  Skilled Demonstration;Verbal Instruction;Written Material       Education Topics: Count Your Pulse:  -Group instruction provided by verbal instruction, demonstration, patient participation and written materials to support subject.  Instructors address importance of being able to find your pulse and how to count your  pulse when at home without a heart monitor.  Patients get hands on experience counting their pulse with staff help and individually.   CARDIAC REHAB PHASE II EXERCISE from 03/19/2018 in [place]  Date  03/19/18  Instruction Review Code  2- Demonstrated Understanding      Heart Attack, Angina, and Risk Factor Modification:  -Group instruction provided by verbal instruction, video, and written materials to support subject.  Instructors address signs and symptoms of angina and heart attacks.    Also discuss risk factors for heart disease and how to make changes to improve heart health risk factors.   Functional Fitness:  -Group instruction provided by verbal instruction, demonstration, patient participation, and written materials to support subject.  Instructors address safety measures for doing things around the house.  Discuss how to get up and down off the floor, how to pick things up properly, how to safely get out of a chair without assistance, and balance training.   CARDIAC REHAB PHASE II EXERCISE from 03/19/2018 in [place]  Date  03/12/18  Educator  EP  Instruction Review Code  2- Demonstrated Understanding      Meditation and Mindfulness:  -Group instruction provided by verbal  instruction, patient participation, and written materials to support subject.  Instructor addresses importance of mindfulness and meditation practice to help reduce stress and improve awareness.  Instructor also leads participants through a meditation exercise.    CARDIAC REHAB PHASE II EXERCISE from 03/19/2018 in [place]  Date  03/10/18  Instruction Review Code  2- Demonstrated Understanding      Stretching for Flexibility and Mobility:  -Group instruction provided by verbal instruction, patient participation, and written materials to support subject.  Instructors lead participants through series of stretches that are designed to increase flexibility thus improving mobility.  These stretches are additional exercise for major muscle groups that are typically performed during regular warm up and cool down.   Hands Only CPR:  -Group verbal, video, and participation provides a basic overview of AHA guidelines for community CPR. Role-play of emergencies allow participants the opportunity to practice calling for help and chest compression technique with discussion of AED use.   Hypertension: -Group verbal and written instruction that provides a basic overview of hypertension including the most recent diagnostic guidelines, risk factor reduction with self-care instructions and medication management.    Nutrition I class: Heart Healthy Eating:  -Group instruction provided by PowerPoint slides, verbal discussion, and written materials to support subject matter. The instructor gives an explanation and review of the Therapeutic Lifestyle Changes diet recommendations, which includes a discussion on lipid goals, dietary fat, sodium, fiber, plant stanol/sterol esters, sugar, and the components of a well-balanced, healthy diet.   Nutrition II class: Lifestyle Skills:  -Group instruction provided by PowerPoint slides, verbal discussion, and written materials to support  subject matter. The instructor gives an explanation and review of label reading, grocery shopping for heart health, heart healthy recipe modifications, and ways to make healthier choices when eating out.   Diabetes Question & Answer:  -Group instruction provided by PowerPoint slides, verbal discussion, and written materials to support subject matter. The instructor gives an explanation and review of diabetes co-morbidities, pre- and post-prandial blood glucose goals, pre-exercise blood glucose goals, signs, symptoms, and treatment of hypoglycemia and hyperglycemia, and foot care basics.   Diabetes Blitz:  -Group instruction provided by [employer], verbal discussion, and written materials to support  subject matter. The instructor gives an explanation and review of the physiology behind type 1 and type 2 diabetes, diabetes medications and rational behind using different medications, pre- and post-prandial blood glucose recommendations and Hemoglobin A1c goals, diabetes diet, and exercise including blood glucose guidelines for exercising safely.    Portion Distortion:  -Group instruction provided by PowerPoint slides, verbal discussion, written materials, and food models to support subject matter. The instructor gives an explanation of serving size versus portion size, changes in portions sizes over the last 20 years, and what consists of a serving from each food group.   Stress Management:  -Group instruction provided by verbal instruction, video, and written materials to support subject matter.  Instructors review role of stress in heart disease and how to cope with stress positively.     CARDIAC REHAB PHASE II EXERCISE from 03/31/2018 in [place]  Date  03/24/18  Educator  RN  Instruction Review Code  2- Demonstrated Understanding      Exercising on Your Own:  -Group instruction provided by verbal instruction, power point, and written materials to  support subject.  Instructors discuss benefits of exercise, components of exercise, frequency and intensity of exercise, and end points for exercise.  Also discuss use of nitroglycerin and activating EMS.  Review options of places to exercise outside of rehab.  Review guidelines for sex with heart disease.   Cardiac Drugs I:  -Group instruction provided by verbal instruction and written materials to support subject.  Instructor reviews cardiac drug classes: antiplatelets, anticoagulants, beta blockers, and statins.  Instructor discusses reasons, side effects, and lifestyle considerations for each drug class.   CARDIAC REHAB PHASE II EXERCISE from 03/19/2018 in [place]  Date  03/03/18  Instruction Review Code  2- Demonstrated Understanding      Cardiac Drugs II:  -Group instruction provided by verbal instruction and written materials to support subject.  Instructor reviews cardiac drug classes: angiotensin converting enzyme inhibitors (ACE-I), angiotensin II receptor blockers (ARBs), nitrates, and calcium channel blockers.  Instructor discusses reasons, side effects, and lifestyle considerations for each drug class.   Anatomy and Physiology of the Circulatory System:  Group verbal and written instruction and models provide basic cardiac anatomy and physiology, with the coronary electrical and arterial systems. Review of: AMI, Angina, Valve disease, Heart Failure, Peripheral Artery Disease, Cardiac Arrhythmia, Pacemakers, and the ICD.   CARDIAC REHAB PHASE II EXERCISE from 03/31/2018 in [place]  Date  03/31/18  Instruction Review Code  2- Demonstrated Understanding      Other Education:  -Group or individual verbal, written, or video instructions that support the educational goals of the cardiac rehab program.   Holiday Eating Survival Tips:  -Group instruction provided by PowerPoint slides, verbal discussion, and written  materials to support subject matter. The instructor gives patients tips, tricks, and techniques to help them not only survive but enjoy the holidays despite the onslaught of food that accompanies the holidays.   Knowledge Questionnaire Score: Knowledge Questionnaire Score - 03/29/18 0258      Knowledge Questionnaire Score   Pre Score  20/24    Post Score  22/24       Core Components/Risk Factors/Patient Goals at Admission: Personal Goals and Risk Factors at Admission - 12/24/17 1005      Core Components/Risk Factors/Patient Goals on Admission    Weight Management  Yes;Weight Loss    Intervention  Weight Management: Develop a  combined nutrition and exercise program designed to reach desired caloric intake, while maintaining appropriate intake of nutrient and fiber, sodium and fats, and appropriate energy expenditure required for the weight goal.;Weight Management: Provide education and appropriate resources to help participant work on and attain dietary goals.;Weight Management/Obesity: Establish reasonable short term and long term weight goals.;Obesity: Provide education and appropriate resources to help participant work on and attain dietary goals.    Admit Weight  191 lb 5.8 oz (86.8 kg)    Goal Weight: Long Term  180 lb (81.6 kg)    Expected Outcomes  Short Term: Continue to assess and modify interventions until short term weight is achieved;Long Term: Adherence to nutrition and physical activity/exercise program aimed toward attainment of established weight goal;Weight Loss: Understanding of general recommendations for a balanced deficit meal plan, which promotes 1-2 lb weight loss per week and includes a negative energy balance of [phone] kcal/d;Understanding recommendations for meals to include 15-35% energy as protein, 25-35% energy from fat, 35-60% energy from carbohydrates, less than 235m of dietary cholesterol, 20-35 gm of total fiber daily;Understanding of distribution of calorie  intake throughout the day with the consumption of 4-5 meals/snacks    Hypertension  Yes    Intervention  Monitor prescription use compliance.;Provide education on lifestyle modifcations including regular physical activity/exercise, weight management, moderate sodium restriction and increased consumption of fresh fruit, vegetables, and low fat dairy, alcohol moderation, and smoking cessation.    Expected Outcomes  Short Term: Continued assessment and intervention until BP is < 140/91mHG in hypertensive participants. < 130/8048mG in hypertensive participants with diabetes, heart failure or chronic kidney disease.;Long Term: Maintenance of blood pressure at goal levels.    Lipids  Yes    Intervention  Provide education and support for participant on nutrition & aerobic/resistive exercise along with prescribed medications to achieve LDL <69m49mDL >40mg59m Expected Outcomes  Short Term: Participant states understanding of desired cholesterol values and is compliant with medications prescribed. Participant is following exercise prescription and nutrition guidelines.;Long Term: Cholesterol controlled with medications as prescribed, with individualized exercise RX and with personalized nutrition plan. Value goals: LDL < 69mg,3m > 40 mg.       Core Components/Risk Factors/Patient Goals Review:  Goals and Risk Factor Review    Row Name 02/04/18 1549 03/04/18 1453 04/01/18 1508         Core Components/Risk Factors/Patient Goals Review   Personal Goals Review  Weight Management/Obesity;Lipids;Hypertension  Weight Management/Obesity;Lipids;Hypertension  Weight Management/Obesity;Lipids;Hypertension     Review  Bob's vital signs have been stable at cardiac rehab. Bob haMikki Santeeone well with exercise  Bob's vital signs have been stable at cardiac rehab. Bob isMikki Santeeing well with exercise  Bob's vital signs have been stable at cardiac rehab. Bob isMikki Santeeing well with exercise     Expected Outcomes  Bob wiMikki Santeecontinue  to participate in phase 2 cardiac rehab for exercise, nutrition and lifestyle modification  Bob wiMikki Santeecontinue to participate in phase 2 cardiac rehab for exercise, nutrition and lifestyle modification  Bob wiMikki Santeecontinue to participate in phase 2 cardiac rehab for exercise, nutrition and lifestyle modification. Bob coMikki Santeeetes cardiac rehab on Friday        Core Components/Risk Factors/Patient Goals at Discharge (Final Review):  Goals and Risk Factor Review - 04/01/18 1508      Core Components/Risk Factors/Patient Goals Review   Personal Goals Review  Weight Management/Obesity;Lipids;Hypertension    Review  Bob's vital signs have been stable at  cardiac rehab. Mikki Santee is doing well with exercise    Expected Outcomes  Mikki Santee will continue to participate in phase 2 cardiac rehab for exercise, nutrition and lifestyle modification. Mikki Santee completes cardiac rehab on Friday       ITP Comments: ITP Comments    Row Name 12/24/17 1245 12/28/17 1556 02/04/18 1546 03/04/18 1451 04/01/18 1507   ITP Comments  Dr. Fransico Him, Medical Director   pt started group exercise. pt tolerated light activity without difficulty. pt oriented to exercise equipment and safety routine. understanding verbalized.   30 Day ITP Review. Patient with good participation and attendance in phase 2 cardiac rehab  30 Day ITP Review. Patient with good participation and attendance in phase 2 cardiac rehab  30 Day ITP Review. Patient with good participation and attendance in phase 2 cardiac rehab      Comments: See ITP comments. Mikki Santee completes cardiac rehab tomorrow.Barnet Pall, RN,BSN 04/01/2018 3:10 PM

## 2018-04-01 NOTE — Telephone Encounter (Signed)
   Primary Cardiologist: Jenkins Rouge, MD  Chart reviewed as part of pre-operative protocol coverage. Patient was contacted 04/01/2018 in reference to pre-operative risk assessment for pending surgery as outlined below.  Bralynn Donado was last seen on 01/07/18 by Dr. Johnsie Cancel.  Since that day, Katsumi Wisler has done well with his CAD and stents.  He is on plavix and this should not be stopped. No need for antibiotics.  Therefore, based on ACC/AHA guidelines, the patient would be at acceptable risk for the planned procedure without further cardiovascular testing.   I will route this recommendation to the requesting party via Epic fax function and remove from pre-op pool.  Please call with questions.  Cecilie Kicks, NP 04/01/2018, 12:03 PM

## 2018-04-02 ENCOUNTER — Encounter (HOSPITAL_COMMUNITY)
Admission: RE | Admit: 2018-04-02 | Discharge: 2018-04-02 | Disposition: A | Discharge status: Home / Self Care | Payer: Medicare Other | Source: Ambulatory Visit | Attending: Cardiovascular Disease | Admitting: Cardiovascular Disease

## 2018-04-02 VITALS — BP 110/58 | HR 64 | Ht 66.0 in | Wt 191.8 lb

## 2018-04-02 DIAGNOSIS — Z955 Presence of coronary angioplasty implant and graft: Secondary | ICD-10-CM | POA: Diagnosis not present

## 2018-04-02 NOTE — Progress Notes (Signed)
Discharge Progress Report  Patient Details  Name: [name] MRN: [id number] Date of Birth: 29-Oct-1945 Referring Provider:     Del Aire from 12/24/2017 in [place]  Referring Provider  Josue Hector MD        Number of Visits: 36  Reason for Discharge:  Patient reached a stable level of exercise. Patient independent in their exercise. Patient has met program and personal goals.  Smoking History:  Social History   Tobacco Use  Smoking Status Former Smoker  . Packs/day: 1.00  . Years: 15.00  . Pack years: 15.00  . Types: Cigarettes  Smokeless Tobacco Never Used  Tobacco Comment   "quit smoking cigarettes in 1979"    Diagnosis:  10/20/2017 Status post coronary artery stent placement  ADL [facility]:   Initial Exercise Prescription: Initial Exercise Prescription - 12/24/17 1000      Date of Initial Exercise RX and Referring Provider   Date  12/24/17    Referring Provider  Josue Hector MD     Expected Discharge Date  04/02/17      Treadmill   MPH  2.3    Grade  1    Minutes  10    METs  2.99      Bike   Level  0.7    Minutes  10    METs  2.55      NuStep   Level  2    SPM  75    Minutes  10    METs  2.8      Prescription Details   Frequency (times per week)  3x    Duration  Progress to 30 minutes of continuous aerobic without signs/symptoms of physical distress      Intensity   THRR 40-80% of Max Heartrate  59-118    Ratings of Perceived Exertion  11-13    Perceived Dyspnea  0-4      Progression   Progression  Continue progressive overload as per policy without signs/symptoms or physical distress.      Resistance Training   Training Prescription  Yes    Weight  3lbs    Reps  10-15       Discharge Exercise Prescription (Final Exercise Prescription Changes): Exercise Prescription Changes - 04/02/18 1502      Response to Exercise   Blood Pressure (Admit)  110/58    Blood  Pressure (Exercise)  160/82    Blood Pressure (Exit)  100/60    Heart Rate (Admit)  64 bpm    Heart Rate (Exercise)  118 bpm    Heart Rate (Exit)  61 bpm    Rating of Perceived Exertion (Exercise)  11    Symptoms  none    Duration  Progress to 30 minutes of  aerobic without signs/symptoms of physical distress    Intensity  THRR unchanged      Progression   Progression  Continue to progress workloads to maintain intensity without signs/symptoms of physical distress.    Average METs  5      Resistance Training   Training Prescription  Yes    Weight  5lbs    Reps  10-15    Time  10 Minutes      Interval Training   Interval Training  No      Treadmill   MPH  3.5    Grade  5    Minutes  10    METs  6.09  Bike   Level  1.7    Minutes  10    METs  4.67      NuStep   Level  7    SPM  85    Minutes  10    METs  4.1      Track   Laps  --    Minutes  --    METs  --      Home Exercise Plan   Plans to continue exercise at  Home (comment)   Walking at home   Frequency  Add 2 additional days to program exercise sessions.    Initial Home Exercises Provided  01/18/18       Functional Capacity: 6 Minute Walk    Row Name 12/24/17 0952 03/22/18 1511       6 Minute Walk   Phase  Initial  Discharge    Distance  1572 feet  1857 feet    Distance % Change  -  18.13 %    Walk Time  6 minutes  6 minutes    # of Rest Breaks  0  0    MPH  2.98  3.52    METS  3.03  3.76    RPE  11  10    Perceived Dyspnea   0  0    VO2 Peak  10.6  13.14    Symptoms  No  No    Resting HR  79 bpm  95 bpm    Resting BP  122/64  124/70    Resting Oxygen Saturation   97 %  -    Exercise Oxygen Saturation  during 6 min walk  98 %  -    Max Ex. HR  101 bpm  118 bpm    Max Ex. BP  126/64  138/78    2 Minute Post BP  122/70  124/68       Psychological, QOL, Others - Outcomes: PHQ 2/9: Depression screen PHQ 2/9 04/02/2018  Decreased Interest 0  Down, Depressed, Hopeless 0  PHQ - 2 Score  0    Quality of Life: Quality of Life - 03/29/18 0936      Quality of Life   Select  Quality of Life      Quality of Life Scores   Health/Function Pre  19.87 %    Health/Function Post  26.4 %    Health/Function % Change  32.86 %    Socioeconomic Pre  20.5 %    Socioeconomic Post  24 %    Socioeconomic % Change   17.07 %    Psych/Spiritual Pre  20.36 %    Psych/Spiritual Post  23.79 %    Psych/Spiritual % Change  16.85 %    Family Pre  26.4 %    Family Post  19.2 %    Family % Change  -27.27 %    GLOBAL Pre  21.06 %    GLOBAL Post  24.31 %    GLOBAL % Change  15.43 %       Personal Goals: Goals established at orientation with interventions provided to work toward goal. Personal Goals and Risk Factors at Admission - 12/24/17 1005      Core Components/Risk Factors/Patient Goals on Admission    Weight Management  Yes;Weight Loss    Intervention  Weight Management: Develop a combined nutrition and exercise program designed to reach desired caloric intake, while maintaining appropriate intake of nutrient and fiber, sodium and fats, and  appropriate energy expenditure required for the weight goal.;Weight Management: Provide education and appropriate resources to help participant work on and attain dietary goals.;Weight Management/Obesity: Establish reasonable short term and long term weight goals.;Obesity: Provide education and appropriate resources to help participant work on and attain dietary goals.    Admit Weight  191 lb 5.8 oz (86.8 kg)    Goal Weight: Long Term  180 lb (81.6 kg)    Expected Outcomes  Short Term: Continue to assess and modify interventions until short term weight is achieved;Long Term: Adherence to nutrition and physical activity/exercise program aimed toward attainment of established weight goal;Weight Loss: Understanding of general recommendations for a balanced deficit meal plan, which promotes 1-2 lb weight loss per week and includes a negative energy balance  of [phone] kcal/d;Understanding recommendations for meals to include 15-35% energy as protein, 25-35% energy from fat, 35-60% energy from carbohydrates, less than 240m of dietary cholesterol, 20-35 gm of total fiber daily;Understanding of distribution of calorie intake throughout the day with the consumption of 4-5 meals/snacks    Hypertension  Yes    Intervention  Monitor prescription use compliance.;Provide education on lifestyle modifcations including regular physical activity/exercise, weight management, moderate sodium restriction and increased consumption of fresh fruit, vegetables, and low fat dairy, alcohol moderation, and smoking cessation.    Expected Outcomes  Short Term: Continued assessment and intervention until BP is < 140/987mHG in hypertensive participants. < 130/8052mG in hypertensive participants with diabetes, heart failure or chronic kidney disease.;Long Term: Maintenance of blood pressure at goal levels.    Lipids  Yes    Intervention  Provide education and support for participant on nutrition & aerobic/resistive exercise along with prescribed medications to achieve LDL <12m56mDL >40mg72m Expected Outcomes  Short Term: Participant states understanding of desired cholesterol values and is compliant with medications prescribed. Participant is following exercise prescription and nutrition guidelines.;Long Term: Cholesterol controlled with medications as prescribed, with individualized exercise RX and with personalized nutrition plan. Value goals: LDL < 12mg,69m > 40 mg.        Personal Goals Discharge: Goals and Risk Factor Review    Row Name 02/04/18 1549 03/04/18 1453 04/01/18 1508 04/02/18 1617       Core Components/Risk Factors/Patient Goals Review   Personal Goals Review  Weight Management/Obesity;Lipids;Hypertension  Weight Management/Obesity;Lipids;Hypertension  Weight Management/Obesity;Lipids;Hypertension  Weight Management/Obesity;Lipids;Hypertension    Review   Bob's vital signs have been stable at cardiac rehab. Bob haMikki Santeeone well with exercise  Bob's vital signs have been stable at cardiac rehab. Bob isMikki Santeeing well with exercise  Bob's vital signs have been stable at cardiac rehab. Bob isMikki Santeeing well with exercise  Bob's vital signs have been stable at cardiac rehab. Bob isMikki Santeeing well with exercise    Expected Outcomes  Bob wiMikki Santeecontinue to participate in phase 2 cardiac rehab for exercise, nutrition and lifestyle modification  Bob wiMikki Santeecontinue to participate in phase 2 cardiac rehab for exercise, nutrition and lifestyle modification  Bob wiMikki Santeecontinue to participate in phase 2 cardiac rehab for exercise, nutrition and lifestyle modification. Bob coMikki Santeeetes cardiac rehab on Friday  Bob will continue to participate in phase 2 cardiac rehab for exercise, nutrition and lifestyle modification. Bob grMikki Santeeates today and will continue exercise at [employer]       Exercise Goals and Review: Exercise Goals    Row Name 12/24/17 0956             Exercise Goals  Increase Physical Activity  Yes       Intervention  Provide advice, education, support and counseling about physical activity/exercise needs.;Develop an individualized exercise prescription for aerobic and resistive training based on initial evaluation findings, risk stratification, comorbidities and participant's personal goals.       Expected Outcomes  Short Term: Attend rehab on a regular basis to increase amount of physical activity.;Long Term: Add in home exercise to make exercise part of routine and to increase amount of physical activity.;Long Term: Exercising regularly at least 3-5 days a week.       Increase Strength and Stamina  Yes       Intervention  Develop an individualized exercise prescription for aerobic and resistive training based on initial evaluation findings, risk stratification, comorbidities and participant's personal goals.;Provide advice, education, support and counseling about  physical activity/exercise needs.       Expected Outcomes  Short Term: Increase workloads from initial exercise prescription for resistance, speed, and METs.;Short Term: Perform resistance training exercises routinely during rehab and add in resistance training at home;Long Term: Improve cardiorespiratory fitness, muscular endurance and strength as measured by increased METs and functional capacity (6MWT)       Able to understand and use rate of perceived exertion (RPE) scale  Yes       Intervention  Provide education and explanation on how to use RPE scale       Expected Outcomes  Short Term: Able to use RPE daily in rehab to express subjective intensity level;Long Term:  Able to use RPE to guide intensity level when exercising independently       Knowledge and understanding of Target Heart Rate Range (THRR)  Yes       Intervention  Provide education and explanation of THRR including how the numbers were predicted and where they are located for reference       Expected Outcomes  Short Term: Able to state/look up THRR;Long Term: Able to use THRR to govern intensity when exercising independently;Short Term: Able to use daily as guideline for intensity in rehab       Understanding of Exercise Prescription  Yes       Intervention  Provide education, explanation, and written materials on patient's individual exercise prescription       Expected Outcomes  Short Term: Able to explain program exercise prescription;Long Term: Able to explain home exercise prescription to exercise independently          Exercise Goals Re-Evaluation: Exercise Goals Re-Evaluation    Row Name 01/04/18 1552 01/18/18 1523 02/08/18 1517 03/08/18 1457 03/22/18 1520     Exercise Goal Re-Evaluation   Exercise Goals Review  Increase Physical Activity;Able to understand and use rate of perceived exertion (RPE) scale  Increase Physical Activity;Able to understand and use rate of perceived exertion (RPE) scale;Understanding of Exercise  Prescription;Knowledge and understanding of Target Heart Rate Range (THRR)  Increase Physical Activity;Able to understand and use rate of perceived exertion (RPE) scale;Understanding of Exercise Prescription;Knowledge and understanding of Target Heart Rate Range (THRR)  Increase Physical Activity;Able to understand and use rate of perceived exertion (RPE) scale;Understanding of Exercise Prescription;Knowledge and understanding of Target Heart Rate Range (THRR)  Increase Physical Activity;Able to understand and use rate of perceived exertion (RPE) scale;Understanding of Exercise Prescription;Knowledge and understanding of Target Heart Rate Range (THRR);Increase Strength and Stamina   Comments  Patient able to understand and use RPE scale appropriately.  Reviewed home exercise guidelines with patient including THRR, RPE scale, and endpoints for  exercise. Pt walks occassionally about 10 minutes and weight training daily. Pt is amenable to increasing aerobic exercise to 30 minutes, at least 2 days/week.  Patient is walking 10-15 minutes at least 5 days/week, stretching and weight training at home.  Patient making good progress with exercise at cardiac rehab. Pt is walking most days, weather permitting. Pt is also stretching and weight training at home.  Patient's functional capacity improved 18% as measure by 6 minute walk test. Strength improved 19% as measure by the grip strength test. Pt is exercising daily and plans to continue this routine upon completion the cardiac rehab program.   Expected Outcomes  Increase workloads as tolerated to help improve cardiorespiratory fitness.  Patient will walk 30 minutes 2 days/week in addition to exercise at cardiac rehab to help strengthen heart muscle and increase stamina.  Progressing workloads to increase strength and stamina.  Continue current exercise prescription to help improve cardiorespiratory fitness.  Patient will exercise at least 30 minutes daily to help achieve  personal health and fitness goals.   [place] Name 04/02/18 1502             Exercise Goal Re-Evaluation   Exercise Goals Review  Increase Physical Activity;Able to understand and use rate of perceived exertion (RPE) scale;Understanding of Exercise Prescription;Knowledge and understanding of Target Heart Rate Range (THRR);Increase Strength and Stamina       Comments  Patient completed the phase 2 cardiac rehab program and has done well achieving 5.0 METs with exercise. Pt plans to continue walking at home and exercise at gym in the [employer] progam, at 30 minutes, 5-7 days/week.       Expected Outcomes  Patient will continue exercise at least 30 minutes daily to help achieve personal health and fitness goals.          Nutrition & Weight - Outcomes: Pre Biometrics - 12/24/17 0954      Pre Biometrics   Height  _0  (1.676 m)    Weight  191 lb 5.8 oz (86.8 kg)    Waist Circumference  43 inches    Hip Circumference  41.5 inches    Waist to Hip Ratio  1.04 %    BMI (Calculated)  30.9    Triceps Skinfold  21 mm    % Body Fat  31.9 %    Grip Strength  39 kg    Flexibility  0 in    Single Leg Stand  2.69 seconds      Post Biometrics - 04/02/18 1601       Post  Biometrics   Height  _1  (1.676 m)    Weight  191 lb 12.8 oz (87 kg)    Waist Circumference  40.75 inches    Hip Circumference  40 inches    Waist to Hip Ratio  1.02 %    BMI (Calculated)  30.97    Triceps Skinfold  10 mm    % Body Fat  27.9 %    Grip Strength  46.5 kg    Flexibility  0 in    Single Leg Stand  30 seconds       Nutrition: Nutrition Therapy & Goals - 12/24/17 1058      Nutrition Therapy   Diet  heart healthy, carb modified      Personal Nutrition Goals   Nutrition Goal  Pt to identify and limit food sources of saturated fat, trans fat, refined carbohydrates and sodium    Personal Goal #2  Pt able to name foods that affect blood glucose.    Personal Goal #3  Pt to reduce frequency of meals  eaten out      Intervention Plan   Intervention  Prescribe, educate and counsel regarding individualized specific dietary modifications aiming towards targeted core components such as weight, hypertension, lipid management, diabetes, heart failure and other comorbidities.    Expected Outcomes  Short Term Goal: Understand basic principles of dietary content, such as calories, fat, sodium, cholesterol and nutrients.;Long Term Goal: Adherence to prescribed nutrition plan.       Nutrition Discharge: Nutrition Assessments - 03/29/18 1037      MEDFICTS Scores   Pre Score  46    Post Score  25    Score Difference  -21       Education Questionnaire Score: Knowledge Questionnaire Score - 03/29/18 0937      Knowledge Questionnaire Score   Pre Score  20/24    Post Score  22/24       Goals reviewed with patient; copy given to patient. Pt graduated from cardiac rehab program today with completion of 36 exercise sessions in Phase II. Pt maintained good attendance and progressed nicely during his participation in rehab as evidenced by increased MET level.   Medication list reconciled. Repeat  PHQ score- 0 .  Pt has made significant lifestyle changes and should be commended for his success. Pt feels he has achieved his goals during cardiac rehab.   Pt plans to continue exercise at [employer]. We are proud of Bob's progress. Mikki Santee increased his distance on his post exercise walk test and maintained his current weight.Barnet Pall, RN,BSN 04/09/2018 9:19 AM

## 2018-07-15 ENCOUNTER — Telehealth: Payer: Self-pay

## 2018-07-15 ENCOUNTER — Ambulatory Visit: Payer: Medicare Other | Admitting: Cardiovascular Disease

## 2018-07-15 NOTE — Telephone Encounter (Signed)

## 2018-07-23 ENCOUNTER — Telehealth: Payer: Self-pay

## 2018-07-23 DIAGNOSIS — E785 Hyperlipidemia, unspecified: Secondary | ICD-10-CM

## 2018-07-23 NOTE — Telephone Encounter (Signed)
Left detailed message that someone from our office would be calling him to set up an appointment with [facility] on the same day he is coming to the office for office visit with Dr. Johnsie Cancel. Left message to call back.

## 2018-07-23 NOTE — Telephone Encounter (Signed)
-----   Message from Josue Hector, MD sent at 07/23/2018 12:54 PM EDT ----- He was supposed to be referred to lipid clinic for PSK-9 ??

## 2018-07-23 NOTE — Progress Notes (Signed)
Cardiology Office Note    Date:  07/28/2018   ID:  [name], DOB [birth year], MRN [id number]  PCP:  Velna Hatchet, MD  Cardiologist: Jenkins Rouge, MD EPS: None  No chief complaint on file.   History of Present Illness:   73 y.o. f/u for advanced CAD and ischemic DCM.  Anterior MI with DES to LAD and circumflex in 2012 [place]. 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 Unstable angina August 2019 cath with Dr Burt Knack. 10/20/17 DES to mid LAD for instent  Restenosis. Other arteries ok except small OM1 not suitable for intervention. TTE with EF 35% F/U MRI 12/25/17 to risk stratify For AICD EF 46% full thickness scar involving distal septum, anterior wall apex and inferior apex. History of GI bleed March 2019 with duodenal erosions that were clipped. Intolerant to ASA. Also history of CKD, HLD, HTN  No angina compliant with meds no recurrent GI bleeding No signs of volume overload or CHF Functional class one    Past Medical History:  Diagnosis Date  . Anxiety   . Arthritis    "wrists" (07/27/2014)  . Coronary artery disease    a.  Pt with 4 stents over the course of 2003-2012;  b.  LHC 6/16:  LAD, RCA, LCx stents patent, OM1 50%, EF 50-55% >> med rx   . Elevated LFTs   . GERD (gastroesophageal reflux disease)   . Hypercholesterolemia   . Hypertension   . Ischemic cardiomyopathy    cMRI 03/2011:  EF 48% with dist ant, septal, apical and inf-apical dyskinesia, no apical clot, dist ant, apical and septal full thickness scar >> No ICD needed  //  Limited echo 7/19: Large apical septal aneurysm, severe hypokinesis of the entire apex and mid to apical segments of the anterior wall, anterolateral wall and anterior septum-consistent with infarct and most of the LAD territory; EF 35   . Left ventricular aneurysm    Limited echo 7/19: Large apical septal aneurysm, severe hypokinesis of the entire apex and mid to apical segments of the anterior wall, anterolateral wall and anterior  septum-consistent with infarct and most of the LAD territory; EF 35  . Myocardial infarction [facility]) 2003    Past Surgical History:  Procedure Laterality Date  . CARDIAC CATHETERIZATION  06/11/2012   Nonobstructive CAD, patent stents, EF 50%, mild apical dyskinesis  . CARDIAC CATHETERIZATION N/A 07/28/2014   Procedure: Left Heart Cath and Coronary Angiography;  Surgeon: Troy Sine, MD;  Location: [facility];  Service: Cardiovascular;  Laterality: N/A;  . CORONARY ANGIOPLASTY WITH STENT PLACEMENT  2003-2012   total of 4 stents place  . CORONARY STENT INTERVENTION N/A 10/20/2017   Procedure: CORONARY STENT INTERVENTION;  Surgeon: Sherren Mocha, MD;  Location: [facility];  Service: Cardiovascular;  Laterality: N/A;  . COSMETIC SURGERY Left 1970's   "dog ripped of my ear"  . LEFT HEART CATH AND CORONARY ANGIOGRAPHY N/A 10/20/2017   Procedure: LEFT HEART CATH AND CORONARY ANGIOGRAPHY;  Surgeon: Sherren Mocha, MD;  Location: [facility];  Service: Cardiovascular;  Laterality: N/A;  . LEFT HEART CATHETERIZATION WITH CORONARY ANGIOGRAM N/A 06/11/2012   Procedure: LEFT HEART CATHETERIZATION WITH CORONARY ANGIOGRAM;  Surgeon: Mase Dhondt M Martinique, MD;  Location: [facility];  Service: Cardiovascular;  Laterality: N/A;    Current Medications: Current Meds  Medication Sig  . carvedilol (COREG) 3.125 MG tablet Take 1 tablet (3.125 mg total) by mouth 2 (two) times daily with a meal.  .  clopidogrel (PLAVIX) 75 MG tablet Take 1 tablet (75 mg total) by mouth daily.  . Cyanocobalamin (VITAMIN B-12) 5000 MCG LOZG Take 5,000 Units by mouth daily.  Marland Kitchen dicyclomine (BENTYL) 20 MG tablet Take 20 mg by mouth 2 (two) times daily after a meal.   . LORazepam (ATIVAN) 0.5 MG tablet Take 1 tablet (0.5 mg total) by mouth every 8 (eight) hours as needed. For anxiety  . nitroGLYCERIN (NITROSTAT) 0.4 MG SL tablet Place 0.4 mg under the tongue every 5 (five) minutes as needed for chest pain. For a max  of 3 doses. If no relief, call 911.  . omega-3 acid ethyl esters (LOVAZA) 1 G capsule Take 1 g by mouth daily.  . pantoprazole (PROTONIX) 40 MG tablet Take 1 tablet (40 mg total) by mouth daily.  . polyethylene glycol (MIRALAX / GLYCOLAX) packet Take 17 g by mouth at bedtime. Mix in 8 oz water and drink  . Probiotic Product (ALIGN) 4 MG CAPS Take 4 mg by mouth daily.   . sacubitril-valsartan (ENTRESTO) 97-103 MG Take 1 tablet by mouth 2 (two) times daily.  . vitamin C (ASCORBIC ACID) 500 MG tablet Take 500 mg by mouth daily.   Marland Kitchen zolpidem (AMBIEN) 10 MG tablet Take 1 tablet (10 mg total) by mouth at bedtime as needed. For sleep (Patient taking differently: Take 5 mg by mouth at bedtime as needed for sleep. )     Allergies:   Asa [aspirin]; Penicillins; and Statins   Social History   Socioeconomic History  . Marital status: Divorced    Spouse name: Not on file  . Number of children: Not on file  . Years of education: Not on file  . Highest education level: Not on file  Occupational History  . Not on file  Social Needs  . Financial resource strain: Not on file  . Food insecurity:    Worry: Not on file    Inability: Not on file  . Transportation needs:    Medical: Not on file    Non-medical: Not on file  Tobacco Use  . Smoking status: Former Smoker    Packs/day: 1.00    Years: 15.00    Pack years: 15.00    Types: Cigarettes  . Smokeless tobacco: Never Used  . Tobacco comment: "quit smoking cigarettes in 1979"  Substance and Sexual Activity  . Alcohol use: Yes    Alcohol/week: 2.0 standard drinks    Types: 2 Glasses of wine per week  . Drug use: No  . Sexual activity: Not Currently  Lifestyle  . Physical activity:    Days per week: Not on file    Minutes per session: Not on file  . Stress: Not on file  Relationships  . Social connections:    Talks on phone: Not on file    Gets together: Not on file    Attends religious service: Not on file    Active member of club or  organization: Not on file    Attends meetings of clubs or organizations: Not on file    Relationship status: Not on file  Other Topics Concern  . Not on file  Social History Narrative  . Not on file     Family History:  The patient's family history includes Coronary artery disease in his father and mother.   ROS:   Please see the history of present illness.    Review of Systems  Constitution: Negative.  HENT: Negative.   Cardiovascular: Negative.  Respiratory: Negative.   Endocrine: Negative.   Hematologic/Lymphatic: Negative.   Musculoskeletal: Negative.   Gastrointestinal: Negative.   Genitourinary: Negative.   Neurological: Negative.    All other systems reviewed and are negative.   PHYSICAL EXAM:   VS:  BP 118/68   Pulse (!) 57   Ht 5\' 6"  (1.676 m)   Wt 88.2 kg   SpO2 99%   BMI 31.38 kg/m   Physical Exam  GEN: Well nourished, well developed, in no acute distress  Affect appropriate Healthy:  appears stated age 73: normal Neck supple with no adenopathy JVP normal no bruits no thyromegaly Lungs clear with no wheezing and good diaphragmatic motion Heart:  S1/S2 no murmur, no rub, gallop or click PMI enlarged  Abdomen: benighn, BS positve, no tenderness, no AAA no bruit.  No HSM or HJR Distal pulses intact with no bruits No edema Neuro non-focal Skin warm and dry No muscular weakness   Wt Readings from Last 3 Encounters:  07/28/18 88.2 kg  04/02/18 87 kg  01/07/18 86.5 kg      Studies/Labs Reviewed:   EKG:  EKG is not ordered today.   Recent Labs: 10/21/2017: BUN 12; Hemoglobin 14.2; Platelets 228; Potassium 4.0; Sodium 138 10/30/2017: NT-Pro BNP 37 12/25/2017: Creatinine, Ser 1.27   Lipid Panel    Component Value Date/Time   CHOL 180 06/11/2012 0234   TRIG 367 (H) 06/11/2012 0234   HDL 34 (L) 06/11/2012 0234   CHOLHDL 5.3 06/11/2012 0234   VLDL 73 (H) 06/11/2012 0234   LDLCALC 73 06/11/2012 0234    Additional studies/ records that were  reviewed today include:   Cardiac catheterization 8/27/2019Ost 1st Mrg to 1st Mrg lesion is 75% stenosed.  Non-stenotic Ost RCA to Mid RCA lesion was previously treated.  Mid RCA to Dist RCA lesion is 40% stenosed.  Prox Cx to Mid Cx lesion is 30% stenosed.  Prox LAD to Mid LAD lesion is 80% stenosed.  A drug-eluting stent was successfully placed using a STENT RESOLUTE ONYX 2.5X38.  Post intervention, there is a 0% residual stenosis.   1.  Multivessel coronary artery disease with continued patency of the stented segment in the RCA, continued patency of the stented segment in the left circumflex with mild in-stent restenosis and severe stenosis of a small obtuse marginal branch not suitable for PCI, and severe diffuse stenosis of the proximal and mid LAD treated successfully with overlapping resolute Onyx drug-eluting stents 2.  Known severe LV systolic dysfunction   Recommendation: The patient should continue on clopidogrel alone.  He is intolerant to aspirin.  I would favor long-term clopidogrel since he is intolerant to aspirin and has been treated with multiple stents.   Recommend dual antiplatelet therapy with Clopidogrel 75mg  daily long-term (beyond 12 months) because of multiple stents, severe LV dysfunction, ASA intolerance. Echo 09/01/17 Impressions:   - Limited Definity enhanced study for LV wall motion and thrombus.   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   There is severe hypokinesis in the entire apex and the mid-apical   segments of the anterior wall, anterolateral wall and anterior   septum.   This is consistent with infarction in most of the LAD artery   territory.   Overall LV ejection fraction is approximately 35%.     Left Heart Cath and Coronary Angiography  07/2014  Conclusion     Ost 1st Mrg to 1st Mrg lesion, 50% stenosed.  Ost LAD to Dist LAD lesion, 20% stenosed.  Low normal LV function with an ejection fraction of 50-55% with evidence for an  akinetic/dyskinetic aneurysmal apex.   Coronary artery  disease with patent tandem proximal to mid LAD stents with smooth 20% in-stent narrowing in the distal aspect of the stent; patent  left circumflex stent with evidence for 50-60% proximal narrowing in a small marginal branch which arises with aupper takeoff from this stented segment; and widely patent RCA stent in a dominant RCA system.   RECOMMENDATION:   Medical therapy.       ASSESSMENT:    1. Ischemic cardiomyopathy      PLAN:  In order of problems listed above:  CAD status post MIs in 2012 in [place] treated with DES to the LAD circumflex and RCA.  August 2019 with unstable angina treated with overlapping DES to the LAD 10/20/2017.  He is aspirin intolerant so plan for long-term clopidogrel.  No angina.    Ischemic cardiomyopathy ejection fraction 35% on echo 08/2017.  Started on Entresto MRI with EF 46% 12/25/17 continue medical Rx  HTN:  Well controlled.  Continue current medications and low sodium Dash type diet. Tends toward bradycardia so beta blocker not increased    CKD stage III creatinine was 1.27  12/25/17 prior to MRI   Hyperlipidemia LDL was 127 triglycerides 205 08/17/2017 on lovaza intolerant to statin refer to lipid clinic for PSK 9 consideration   History of GI bleed in [place] 04/2017 secondary to large erosion and duodenal bulb that was clipped.    Medication Adjustments/Labs and Tests Ordered: Current medicines are reviewed at length with the patient today.  Concerns regarding medicines are outlined above.  Medication changes, Labs and Tests ordered today are listed in the Patient Instructions below. Patient Instructions  Medication Instructions:  Your physician recommends that you continue on your current medications as directed. Please refer to the Current Medication list given to you today.  Labwork: NONE  Testing/Procedures: NONE  Follow-Up: Your physician wants you to follow-up in: 6 months  with Dr. Johnsie Cancel. You will receive a reminder letter in the mail two months in advance. If you don't receive a letter, please call our office to schedule the follow-up appointment.   If you need a refill on your cardiac medications before your next appointment, please call your pharmacy.       Signed, Jenkins Rouge, MD  07/28/2018 2:31 PM    [facility] [address] [place], [place], [place]  [address] Phone: [phone]; Fax: [phone]

## 2018-07-23 NOTE — Telephone Encounter (Signed)
Placed order for lipid clinic referral. Will have scheduling call patient to make an appointment.

## 2018-07-28 ENCOUNTER — Encounter (INDEPENDENT_AMBULATORY_CARE_PROVIDER_SITE_OTHER): Payer: Self-pay

## 2018-07-28 ENCOUNTER — Other Ambulatory Visit: Payer: Self-pay

## 2018-07-28 ENCOUNTER — Encounter: Payer: Self-pay | Admitting: Pharmacist

## 2018-07-28 ENCOUNTER — Ambulatory Visit (INDEPENDENT_AMBULATORY_CARE_PROVIDER_SITE_OTHER): Payer: Medicare Other | Admitting: Pharmacist

## 2018-07-28 ENCOUNTER — Encounter: Payer: Self-pay | Admitting: Cardiovascular Disease

## 2018-07-28 ENCOUNTER — Ambulatory Visit (INDEPENDENT_AMBULATORY_CARE_PROVIDER_SITE_OTHER): Payer: Medicare Other | Admitting: Cardiovascular Disease

## 2018-07-28 VITALS — BP 118/68 | HR 57 | Ht 66.0 in | Wt 194.4 lb

## 2018-07-28 DIAGNOSIS — E785 Hyperlipidemia, unspecified: Secondary | ICD-10-CM

## 2018-07-28 DIAGNOSIS — I255 Ischemic cardiomyopathy: Secondary | ICD-10-CM | POA: Diagnosis not present

## 2018-07-28 NOTE — Progress Notes (Signed)
Patient ID: [name]                 DOB: 1945/12/18                    MRN: [id number]     HPI: [name] is a 73 y.o. male patient of Dr. Johnsie Cancel that presents today for lipid evaluation.  PMH includes CAD s/p coronary angioplasty, HTN, CKD, HLD. Pt previously referred to lipid clinic, but declined. He presents today for discussion of cholesterol.   He states that when he takes statin products he gets muscle aches so that he is unable to walk or get up. He is not interested in trying another statin due to side effects. He also states that he will not do injections because "he gets too many when he rides in the ambulance or in the hospital."   Risk Factors: CAD s/p DES to LAD, restented in 09/2017, HTN, age LDL Goal: <70, nonHDL <100  Current Medications: Lovaza 1g daily Intolerances: ezetimibe 10mg  daily, simvastatin 5mg  daily, lovastatin 10mg  daily, rosvuastatin daily, Lipitor, red yeast rice (muscle aches)  Diet: most meals from home. He rarely eats meat. He does eat vegetables. He does not eat a lot of carbs either. He does eat eggs regularly. He drinks water or orange juice and grape juice.    Exercise: He does exercise regularly including weights and walking.   Family History: Coronary artery disease in his father and mother.   Social History: former smoker, quit in [place]: 08/17/17: TC 206, TG 205, HDL 38, LDL 127, nonHDL 168 (Lovaza 1g daily)  Past Medical History:  Diagnosis Date  . Anxiety   . Arthritis    "wrists" (07/27/2014)  . Coronary artery disease    a.  Pt with 4 stents over the course of 2003-2012;  b.  LHC 6/16:  LAD, RCA, LCx stents patent, OM1 50%, EF 50-55% >> med rx   . Elevated LFTs   . GERD (gastroesophageal reflux disease)   . Hypercholesterolemia   . Hypertension   . Ischemic cardiomyopathy    cMRI 03/2011:  EF 48% with dist ant, septal, apical and inf-apical dyskinesia, no apical clot, dist ant, apical and septal full thickness scar >> No  ICD needed  //  Limited echo 7/19: Large apical septal aneurysm, severe hypokinesis of the entire apex and mid to apical segments of the anterior wall, anterolateral wall and anterior septum-consistent with infarct and most of the LAD territory; EF 35   . Left ventricular aneurysm    Limited echo 7/19: Large apical septal aneurysm, severe hypokinesis of the entire apex and mid to apical segments of the anterior wall, anterolateral wall and anterior septum-consistent with infarct and most of the LAD territory; EF 35  . Myocardial infarction [facility]) 2003    Current Outpatient Medications on File Prior to Visit  Medication Sig Dispense Refill  . carvedilol (COREG) 3.125 MG tablet Take 1 tablet (3.125 mg total) by mouth 2 (two) times daily with a meal. 180 tablet 3  . clopidogrel (PLAVIX) 75 MG tablet Take 1 tablet (75 mg total) by mouth daily. 30 tablet 3  . Cyanocobalamin (VITAMIN B-12) 5000 MCG LOZG Take 5,000 Units by mouth daily.    Marland Kitchen dicyclomine (BENTYL) 20 MG tablet Take 20 mg by mouth 2 (two) times daily after a meal.     . LORazepam (ATIVAN) 0.5 MG tablet Take 1 tablet (0.5 mg total) by mouth every 8 (eight)  hours as needed. For anxiety 60 tablet 5  . nitroGLYCERIN (NITROSTAT) 0.4 MG SL tablet Place 0.4 mg under the tongue every 5 (five) minutes as needed for chest pain. For a max of 3 doses. If no relief, call 911.    . omega-3 acid ethyl esters (LOVAZA) 1 G capsule Take 1 g by mouth daily.    . pantoprazole (PROTONIX) 40 MG tablet Take 1 tablet (40 mg total) by mouth daily. 30 tablet 11  . polyethylene glycol (MIRALAX / GLYCOLAX) packet Take 17 g by mouth at bedtime. Mix in 8 oz water and drink    . Probiotic Product (ALIGN) 4 MG CAPS Take 4 mg by mouth daily.     . sacubitril-valsartan (ENTRESTO) 97-103 MG Take 1 tablet by mouth 2 (two) times daily. 60 tablet 11  . vitamin C (ASCORBIC ACID) 500 MG tablet Take 500 mg by mouth daily.     Marland Kitchen zolpidem (AMBIEN) 10 MG tablet Take 1 tablet (10 mg  total) by mouth at bedtime as needed. For sleep (Patient taking differently: Take 5 mg by mouth at bedtime as needed for sleep. ) 30 tablet 5  . [DISCONTINUED] spironolactone (ALDACTONE) 25 MG tablet Take 25 mg by mouth daily.      No current facility-administered medications on file prior to visit.     Allergies  Allergen Reactions  . Asa [Aspirin] Other (See Comments)    Abdominal bleeding  . Penicillins Anaphylaxis    Has patient had a PCN reaction causing immediate rash, facial/tongue/throat swelling, SOB or lightheadedness with hypotension: Yes Has patient had a PCN reaction causing severe rash involving mucus membranes or skin necrosis: No Has patient had a PCN reaction that required hospitalization: No Has patient had a PCN reaction occurring within the last 10 years: No If all of the above answers are "NO", then may proceed with Cephalosporin use.  . Statins Other (See Comments)    Stiff joints, muscle tightness, couldn't walk    Assessment/Plan: Hyperlipidemia: LDL is not at goal. Attempted to discuss options with patient including, retrial of low dose statin, Nexletol, and PCSK9i therapy. Pt declines injections and retrial of statin. He states he is not really interested in Nexletol due to it being new, but will think about it and call the clinic if he wishes to pursue therapy. Will plan to reach out to him in a few days to see about starting Nexletol. Otherwise will refer back to Dr. Johnsie Cancel as pt is not interested in therapy.    Thank you,  Lelan Pons. Patterson Hammersmith, [facility]  07/28/2018 1:06 PM

## 2018-07-28 NOTE — Patient Instructions (Signed)
Please consider taking Nexletol for your cholesterol. This is an oral agent that lowers your bad cholesterol (LDL).  If you have any question about the medication or cholesterol please call the clinic at [phone].   We will plan to follow up with your on the phone in a few days about your decision to try Nexletol.    Cholesterol Content in Foods Cholesterol is a waxy, fat-like substance that helps to carry fat in the blood. The body needs cholesterol in small amounts, but too much cholesterol can cause damage to the arteries and heart. Most people should eat less than 200 milligrams (mg) of cholesterol a day. Foods with cholesterol  Cholesterol is found in animal-based foods, such as meat, seafood, and dairy. Generally, low-fat dairy and lean meats have less cholesterol than full-fat dairy and fatty meats. The milligrams of cholesterol per serving (mg per serving) of common cholesterol-containing foods are listed below. Meat and other proteins  Egg - one large whole egg has 186 mg.  Veal shank - 4 oz has 141 mg.  Lean ground [place] (93% lean) - 4 oz has 118 mg.  Fat-trimmed lamb loin - 4 oz has 106 mg.  Lean ground beef (90% lean) - 4 oz has 100 mg.  Lobster - 3.5 oz has 90 mg.  Pork loin chops - 4 oz has 86 mg.  Canned salmon - 3.5 oz has 83 mg.  Fat-trimmed beef top loin - 4 oz has 78 mg.  Frankfurter - 1 frank (3.5 oz) has 77 mg.  Crab - 3.5 oz has 71 mg.  Roasted chicken without skin, white meat - 4 oz has 66 mg.  Light bologna - 2 oz has 45 mg.  Deli-cut [place] - 2 oz has 31 mg.  Canned tuna - 3.5 oz has 31 mg.  Bacon - 1 oz has 29 mg.  Oysters and mussels (raw) - 3.5 oz has 25 mg.  Mackerel - 1 oz has 22 mg.  Trout - 1 oz has 20 mg.  Pork sausage - 1 link (1 oz) has 17 mg.  Salmon - 1 oz has 16 mg.  Tilapia - 1 oz has 14 mg. Dairy  Soft-serve ice cream -  cup (4 oz) has 103 mg.  Whole-milk yogurt - 1 cup (8 oz) has 29 mg.  Cheddar cheese - 1  oz has 28 mg.  American cheese - 1 oz has 28 mg.  Whole milk - 1 cup (8 oz) has 23 mg.  2% milk - 1 cup (8 oz) has 18 mg.  Cream cheese - 1 tablespoon (Tbsp) has 15 mg.  Cottage cheese -  cup (4 oz) has 14 mg.  Low-fat (1%) milk - 1 cup (8 oz) has 10 mg.  Sour cream - 1 Tbsp has 8.5 mg.  Low-fat yogurt - 1 cup (8 oz) has 8 mg.  Nonfat Greek yogurt - 1 cup (8 oz) has 7 mg.  Half-and-half cream - 1 Tbsp has 5 mg. Fats and oils  Cod liver oil - 1 tablespoon (Tbsp) has 82 mg.  Butter - 1 Tbsp has 15 mg.  Lard - 1 Tbsp has 14 mg.  Bacon grease - 1 Tbsp has 14 mg.  Mayonnaise - 1 Tbsp has 5-10 mg.  Margarine - 1 Tbsp has 3-10 mg. Exact amounts of cholesterol in these foods may vary depending on specific ingredients and brands. Foods without cholesterol Most plant-based foods do not have cholesterol unless you combine them with a food that  has cholesterol. Foods without cholesterol include:  Grains and cereals.  Vegetables.  Fruits.  Vegetable oils, such as olive, canola, and sunflower oil.  Legumes, such as peas, beans, and lentils.  Nuts and seeds.  Egg whites. Summary  The body needs cholesterol in small amounts, but too much cholesterol can cause damage to the arteries and heart.  Most people should eat less than 200 milligrams (mg) of cholesterol a day. This information is not intended to replace advice given to you by your health care provider. Make sure you discuss any questions you have with your health care provider. Document Released: 10/07/2016 Document Revised: 10/07/2016 Document Reviewed: 10/07/2016 Elsevier Interactive Patient Education  [employer].

## 2018-07-28 NOTE — Patient Instructions (Signed)

## 2018-07-30 NOTE — Telephone Encounter (Signed)
Patient had visit with lipid clinic after visit with Dr. Johnsie Cancel, pleas see their note.

## 2018-08-03 ENCOUNTER — Telehealth: Payer: Self-pay | Admitting: Pharmacist

## 2018-08-03 NOTE — Telephone Encounter (Signed)
LMOM to follow up if pt would like to pursue cholesterol therapy. Asked that pt call office back if would like to discuss further.

## 2018-08-12 ENCOUNTER — Other Ambulatory Visit: Payer: Self-pay | Admitting: Cardiovascular Disease

## 2018-08-12 MED ORDER — CARVEDILOL 3.125 MG PO TABS: 3.1250 mg | ORAL_TABLET | Freq: Two times a day (BID) | ORAL | 3 refills | Status: DC

## 2018-10-12 ENCOUNTER — Other Ambulatory Visit: Payer: Self-pay | Admitting: Cardiovascular Disease

## 2019-04-20 ENCOUNTER — Other Ambulatory Visit: Payer: Self-pay | Admitting: Cardiovascular Disease

## 2019-06-14 NOTE — Progress Notes (Signed)
Cardiology Office Note   Date:  06/16/2019   ID:  [name], DOB [birth year], MRN [id number]  PCP:  Velna Hatchet, MD  Cardiologist:  Dr. Johnsie Cancel, MD   Chief Complaint  Patient presents with  . Follow-up    History of Present Illness: [name] is a 74 y.o. male who presents for 68-month follow-up, seen for Dr. Johnsie Cancel.   [name] has a hx of advanced CAD and ischemic dilated cardiomyopathy, CKD, HLD, prior GI bleed with duodenal erosions now intolerant to ASA, and hypertension.  Cardiac history includes anterior MI with DES/PCI to LAD and circumflex in 2012 in [place]. 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  He then had unstable angina 09/2017 and underwent LHC with Dr. Burt Knack with DES to mid LAD for in-stent restenosis. Other arteries were okay except small OM1 not suitable for intervention.  Echocardiogram at that time showed EF of 35% with follow-up MRI 12/25/2017 to risk stratify for AICD which showed an LVEF 46% with full thickness scar involving distal septum, anterior wall apex and inferior apex.   He was last seen by D. Nishan 07/28/2018 in follow-up and was doing well from a CV standpoint.  Today he states that he is doing well. He denies chest pain, palpitations, SOB, LE edema, orthopnea, dizziness or syncope. Continues to lift weights several times a week without CV symptoms. States that his family is in [facility] and he has need been able to se them due to restrictions. He is hoping to soon. BP stable today. Tolerating medications well. Discussed Lipid clinic referral however he has declined.   Past Medical History:  Diagnosis Date  . Anxiety   . Arthritis    "wrists" (07/27/2014)  . Coronary artery disease    a.  Pt with 4 stents over the course of 2003-2012;  b.  LHC 6/16:  LAD, RCA, LCx stents patent, OM1 50%, EF 50-55% >> med rx   . Elevated LFTs   . GERD (gastroesophageal reflux disease)   . Hypercholesterolemia   . Hypertension   . Ischemic cardiomyopathy     cMRI 03/2011:  EF 48% with dist ant, septal, apical and inf-apical dyskinesia, no apical clot, dist ant, apical and septal full thickness scar >> No ICD needed  //  Limited echo 7/19: Large apical septal aneurysm, severe hypokinesis of the entire apex and mid to apical segments of the anterior wall, anterolateral wall and anterior septum-consistent with infarct and most of the LAD territory; EF 35   . Left ventricular aneurysm    Limited echo 7/19: Large apical septal aneurysm, severe hypokinesis of the entire apex and mid to apical segments of the anterior wall, anterolateral wall and anterior septum-consistent with infarct and most of the LAD territory; EF 35  . Myocardial infarction [facility]) 2003    Past Surgical History:  Procedure Laterality Date  . CARDIAC CATHETERIZATION  06/11/2012   Nonobstructive CAD, patent stents, EF 50%, mild apical dyskinesis  . CARDIAC CATHETERIZATION N/A 07/28/2014   Procedure: Left Heart Cath and Coronary Angiography;  Surgeon: Troy Sine, MD;  Location: [facility];  Service: Cardiovascular;  Laterality: N/A;  . CORONARY ANGIOPLASTY WITH STENT PLACEMENT  2003-2012   total of 4 stents place  . CORONARY STENT INTERVENTION N/A 10/20/2017   Procedure: CORONARY STENT INTERVENTION;  Surgeon: Sherren Mocha, MD;  Location: [facility];  Service: Cardiovascular;  Laterality: N/A;  . COSMETIC SURGERY Left 1970's   "dog ripped of my  ear"  . LEFT HEART CATH AND CORONARY ANGIOGRAPHY N/A 10/20/2017   Procedure: LEFT HEART CATH AND CORONARY ANGIOGRAPHY;  Surgeon: Sherren Mocha, MD;  Location: [facility];  Service: Cardiovascular;  Laterality: N/A;  . LEFT HEART CATHETERIZATION WITH CORONARY ANGIOGRAM N/A 06/11/2012   Procedure: LEFT HEART CATHETERIZATION WITH CORONARY ANGIOGRAM;  Surgeon: Peter M Martinique, MD;  Location: [facility];  Service: Cardiovascular;  Laterality: N/A;     Current Outpatient Medications  Medication Sig Dispense Refill  .  carvedilol (COREG) 3.125 MG tablet Take 1 tablet (3.125 mg total) by mouth 2 (two) times daily with a meal. 180 tablet 3  . clopidogrel (PLAVIX) 75 MG tablet Take 1 tablet (75 mg total) by mouth daily. 30 tablet 3  . Cyanocobalamin (VITAMIN B-12) 5000 MCG LOZG Take 5,000 Units by mouth daily.    Marland Kitchen dicyclomine (BENTYL) 20 MG tablet Take 20 mg by mouth 2 (two) times daily after a meal.     . ENTRESTO 97-103 MG TAKE 1 TABLET BY MOUTH TWICE A DAY 180 tablet 0  . LORazepam (ATIVAN) 0.5 MG tablet Take 1 tablet (0.5 mg total) by mouth every 8 (eight) hours as needed. For anxiety 60 tablet 5  . nitroGLYCERIN (NITROSTAT) 0.4 MG SL tablet Place 0.4 mg under the tongue every 5 (five) minutes as needed for chest pain. For a max of 3 doses. If no relief, call 911.    . omega-3 acid ethyl esters (LOVAZA) 1 G capsule Take 1 g by mouth daily.    . pantoprazole (PROTONIX) 40 MG tablet Take 1 tablet (40 mg total) by mouth daily. 30 tablet 11  . polyethylene glycol (MIRALAX / GLYCOLAX) packet Take 17 g by mouth at bedtime. Mix in 8 oz water and drink    . Probiotic Product (ALIGN) 4 MG CAPS Take 4 mg by mouth daily.     . vitamin C (ASCORBIC ACID) 500 MG tablet Take 500 mg by mouth daily.     Marland Kitchen zolpidem (AMBIEN) 10 MG tablet Take 1 tablet (10 mg total) by mouth at bedtime as needed. For sleep 30 tablet 5   No current facility-administered medications for this visit.    Allergies:   Asa [aspirin], Penicillins, and Statins    Social History:  The patient  reports that he has quit smoking. His smoking use included cigarettes. He has a 15.00 pack-year smoking history. He has never used smokeless tobacco. He reports current alcohol use of about 2.0 standard drinks of alcohol per week. He reports that he does not use drugs.   Family History:  The patient's family history includes Coronary artery disease in his father and mother.    ROS:  Please see the history of present illness. Otherwise, review of systems are  positive for none.   All other systems are reviewed and negative.    PHYSICAL EXAM: VS:  BP 130/78 (BP Location: Right Arm, Patient Position: Sitting, Cuff Size: Large)   Pulse (!) 54   Ht 5\' 6"  (1.676 m)   Wt 198 lb 6.4 oz (90 kg)   SpO2 98%   BMI 32.02 kg/m  , BMI Body mass index is 32.02 kg/m.    General: Well developed, well nourished, NAD Neck: Negative for carotid bruits. No JVD Lungs:Clear to ausculation bilaterally. No wheezes, rales, or rhonchi. Breathing is unlabored. Cardiovascular: RRR with S1 S2. No murmurs Extremities: No edema. Radial pulses 2+ bilaterally Neuro: Alert and oriented. No focal deficits. No facial asymmetry. MAE  spontaneously. Psych: Responds to questions appropriately with normal affect.     EKG:  EKG is ordered today. NSR with fascicular block and no acute changes.   Recent Labs: No results found for requested labs within last 8760 hours.    Lipid Panel    Component Value Date/Time   CHOL 180 06/11/2012 0234   TRIG 367 (H) 06/11/2012 0234   HDL 34 (L) 06/11/2012 0234   CHOLHDL 5.3 06/11/2012 0234   VLDL 73 (H) 06/11/2012 0234   LDLCALC 73 06/11/2012 0234      Wt Readings from Last 3 Encounters:  06/16/19 198 lb 6.4 oz (90 kg)  07/28/18 194 lb 6.4 oz (88.2 kg)  04/02/18 191 lb 12.8 oz (87 kg)    Other studies Reviewed: Additional studies/ records that were reviewed today include:     Cardiac catheterization 8/27/2019Ost 1st Mrg to 1st Mrg lesion is 75% stenosed.  Non-stenotic Ost RCA to Mid RCA lesion was previously treated.  Mid RCA to Dist RCA lesion is 40% stenosed.  Prox Cx to Mid Cx lesion is 30% stenosed.  Prox LAD to Mid LAD lesion is 80% stenosed.  A drug-eluting stent was successfully placed using a STENT RESOLUTE ONYX 2.5X38.  Post intervention, there is a 0% residual stenosis.  1. Multivessel coronary artery disease with continued patency of the stented segment in the RCA, continued patency of the stented  segment in the left circumflex with mild in-stent restenosis and severe stenosis of a small obtuse marginal branch not suitable for PCI, and severe diffuse stenosis of the proximal and mid LAD treated successfully with overlapping resolute Onyx drug-eluting stents 2. Known severe LV systolic dysfunction  Recommendation: The patient should continue on clopidogrel alone. He is intolerant to aspirin. I would favor long-term clopidogrel since he is intolerant to aspirin and has been treated with multiple stents.  Recommend dual antiplatelet therapy with Clopidogrel 75mg  dailylong-term (beyond 12 months) because of multiple stents, severe LV dysfunction, ASA intolerance.  Echo 09/01/17 Impressions:  - Limited Definity enhanced study for LV wall motion and thrombus. 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 There is severe hypokinesis in the entire apex and the mid-apical segments of the anterior wall, anterolateral wall and anterior septum. This is consistent with infarction in most of the LAD artery territory. Overall LV ejection fraction is approximately 35%.   Left Heart Cath and Coronary Angiography6/2016  Conclusion    Ost 1st Mrg to 1st Mrg lesion, 50% stenosed.  Ost LAD to Dist LAD lesion, 20% stenosed.  Low normal LV function with an ejection fraction of 50-55% with evidence for an akinetic/dyskinetic aneurysmal apex.  Coronary artery disease with patent tandem proximal to mid LAD stents with smooth 20% in-stent narrowing in the distal aspect of the stent; patent left circumflex stent with evidence for 50-60% proximal narrowing in a small marginal branch which arises with aupper takeoff from this stented segment; and widely patent RCA stent in a dominant RCA system.  RECOMMENDATION:  Medical therapy.    ASSESSMENT AND PLAN:  1.  CAD: -s/p two MIs in 2012 treated with DES to LAD, circumflex and RCA areas.  09/2017 with unstable angina treated with  overlapping DES to LAD.  -Has known statin intolerance so current plan is for long-term Plavix>>no issues  -Denies anginal symptoms  2.  Ischemic cardiomyopathy: -LVEF noted to be 35% per echocardiogram 08/2017 at which time he was placed on Entresto with follow-up cardiac MRI showing an EF of 46% -Continue current regimen -  Tolerating high dose Entresto -Plan for follow echo prior to next OV for re-assessment   3.  Hypertension: -Stable, 130/78 -Continue current regimen   4.  CKD stage III: -Last creatinine, 1.27 -Obtain lab work today   5.  Hyperlipidemia: -Last LDL, 127 with triglycerides at 205 08/17/2017 -Currently on Lovaza secondary to intolerance to statin therapy -Plan was for lipid clinic referral for PCSK9 inhibitor however patient has deferred further recommendations   6.  History of GI bleed: -Occurred 04/2017 in [place] secondary to large erosion of duodenal bulb which was clipped -No ASA   Current medicines are reviewed at length with the patient today.  The patient does not have concerns regarding medicines.  The following changes have been made:  no change  Labs/ tests ordered today include: CMET, Lipid, CBC No orders of the defined types were placed in this encounter.    Disposition:   FU with Dr. Johnsie Cancel in 6 months  Signed, Kathyrn Drown, NP  06/16/2019 10:34 AM    [address], [place], [place]  [address] Phone: [phone]; Fax: [phone]

## 2019-06-16 ENCOUNTER — Other Ambulatory Visit: Payer: Self-pay

## 2019-06-16 ENCOUNTER — Ambulatory Visit (INDEPENDENT_AMBULATORY_CARE_PROVIDER_SITE_OTHER): Payer: Medicare Other | Admitting: Cardiology

## 2019-06-16 ENCOUNTER — Encounter: Payer: Self-pay | Admitting: Cardiology

## 2019-06-16 VITALS — BP 130/78 | HR 54 | Ht 66.0 in | Wt 198.4 lb

## 2019-06-16 DIAGNOSIS — I2510 Atherosclerotic heart disease of native coronary artery without angina pectoris: Secondary | ICD-10-CM

## 2019-06-16 DIAGNOSIS — I252 Old myocardial infarction: Secondary | ICD-10-CM

## 2019-06-16 DIAGNOSIS — E785 Hyperlipidemia, unspecified: Secondary | ICD-10-CM | POA: Diagnosis not present

## 2019-06-16 DIAGNOSIS — I255 Ischemic cardiomyopathy: Secondary | ICD-10-CM | POA: Diagnosis not present

## 2019-06-16 DIAGNOSIS — N183 Chronic kidney disease, stage 3 unspecified: Secondary | ICD-10-CM | POA: Diagnosis not present

## 2019-06-16 DIAGNOSIS — I5022 Chronic systolic (congestive) heart failure: Secondary | ICD-10-CM

## 2019-06-16 DIAGNOSIS — Z9861 Coronary angioplasty status: Secondary | ICD-10-CM

## 2019-06-16 DIAGNOSIS — I10 Essential (primary) hypertension: Secondary | ICD-10-CM

## 2019-06-16 LAB — CBC
Hematocrit: 45.1 % (ref 37.5–51.0)
Hemoglobin: 15.3 g/dL (ref 13.0–17.7)
MCH: 31.8 pg (ref 26.6–33.0)
MCHC: 33.9 g/dL (ref 31.5–35.7)
MCV: 94 fL (ref 79–97)
Platelets: 238 10*3/uL (ref 150–450)
RBC: 4.81 x10E6/uL (ref 4.14–5.80)
RDW: 13.8 % (ref 11.6–15.4)
WBC: 5.6 10*3/uL (ref 3.4–10.8)

## 2019-06-16 LAB — COMPREHENSIVE METABOLIC PANEL
ALT: 91 IU/L — ABNORMAL HIGH (ref 0–44)
AST: 51 IU/L — ABNORMAL HIGH (ref 0–40)
Albumin/Globulin Ratio: 1.5 (ref 1.2–2.2)
Albumin: 4.7 g/dL (ref 3.7–4.7)
Alkaline Phosphatase: 56 IU/L (ref 39–117)
BUN/Creatinine Ratio: 13 (ref 10–24)
BUN: 17 mg/dL (ref 8–27)
Bilirubin Total: 0.5 mg/dL (ref 0.0–1.2)
CO2: 26 mmol/L (ref 20–29)
Calcium: 10.4 mg/dL — ABNORMAL HIGH (ref 8.6–10.2)
Chloride: 100 mmol/L (ref 96–106)
Creatinine, Ser: 1.3 mg/dL — ABNORMAL HIGH (ref 0.76–1.27)
GFR calc Af Amer: 63 mL/min/{1.73_m2} (ref >59)
GFR calc non Af Amer: 54 mL/min/{1.73_m2} — ABNORMAL LOW (ref >59)
Globulin, Total: 3.2 g/dL (ref 1.5–4.5)
Glucose: 128 mg/dL — ABNORMAL HIGH (ref 65–99)
Potassium: 4.7 mmol/L (ref 3.5–5.2)
Sodium: 139 mmol/L (ref 134–144)
Total Protein: 7.9 g/dL (ref 6.0–8.5)

## 2019-06-16 LAB — LIPID PANEL
Chol/HDL Ratio: 5.5 ratio — ABNORMAL HIGH (ref 0.0–5.0)
Cholesterol, Total: 210 mg/dL — ABNORMAL HIGH (ref 100–199)
HDL: 38 mg/dL — ABNORMAL LOW (ref >39)
LDL Chol Calc (NIH): 112 mg/dL — ABNORMAL HIGH (ref 0–99)
Triglycerides: 350 mg/dL — ABNORMAL HIGH (ref 0–149)
VLDL Cholesterol Cal: 60 mg/dL — ABNORMAL HIGH (ref 5–40)

## 2019-06-16 NOTE — Patient Instructions (Signed)
Medication Instructions:   Your physician recommends that you continue on your current medications as directed. Please refer to the Current Medication list given to you today.  *If you need a refill on your cardiac medications before your next appointment, please call your pharmacy*  Lab Work:  You will have labs drawn today: CMET/Lipids/CBC  Testing/Procedures:  Your physician has requested that you have an echocardiogram 2-3 weeks before 6 month follow up with Dr. Johnsie Cancel. Echocardiography is a painless test that uses sound waves to create images of your heart. It provides your doctor with information about the size and shape of your heart and how well your heart's chambers and valves are working. This procedure takes approximately one hour. There are no restrictions for this procedure.  Follow-Up: At [facility], you and your health needs are our priority.  As part of our continuing mission to provide you with exceptional heart care, we have created designated Provider Care Teams.  These Care Teams include your primary Cardiologist (physician) and Advanced Practice Providers (APPs -  Physician Assistants and Nurse Practitioners) who all work together to provide you with the care you need, when you need it.  We recommend signing up for the patient portal called "MyChart".  Sign up information is provided on this After Visit Summary.  MyChart is used to connect with patients for Virtual Visits (Telemedicine).  Patients are able to view lab/test results, encounter notes, upcoming appointments, etc.  Non-urgent messages can be sent to your provider as well.   To learn more about what you can do with MyChart, go to [email or web address].    Your next appointment:   6 month(s)  The format for your next appointment:   In Person  Provider:   Jenkins Rouge, MD

## 2019-06-17 ENCOUNTER — Other Ambulatory Visit: Payer: Self-pay | Admitting: Cardiovascular Disease

## 2019-06-17 ENCOUNTER — Other Ambulatory Visit: Payer: Self-pay

## 2019-06-17 DIAGNOSIS — R7989 Other specified abnormal findings of blood chemistry: Secondary | ICD-10-CM

## 2019-06-24 ENCOUNTER — Other Ambulatory Visit: Payer: Self-pay

## 2019-06-24 ENCOUNTER — Other Ambulatory Visit: Payer: Medicare Other | Admitting: *Deleted

## 2019-06-24 DIAGNOSIS — R7989 Other specified abnormal findings of blood chemistry: Secondary | ICD-10-CM

## 2019-06-25 LAB — HEPATIC FUNCTION PANEL
ALT: 100 IU/L — ABNORMAL HIGH (ref 0–44)
AST: 56 IU/L — ABNORMAL HIGH (ref 0–40)
Albumin: 4.7 g/dL (ref 3.7–4.7)
Alkaline Phosphatase: 52 IU/L (ref 39–117)
Bilirubin Total: 0.5 mg/dL (ref 0.0–1.2)
Bilirubin, Direct: 0.13 mg/dL (ref 0.00–0.40)
Total Protein: 7.3 g/dL (ref 6.0–8.5)

## 2019-07-07 ENCOUNTER — Other Ambulatory Visit: Payer: Self-pay | Admitting: Internal Medicine

## 2019-07-07 DIAGNOSIS — R7989 Other specified abnormal findings of blood chemistry: Secondary | ICD-10-CM

## 2019-07-15 ENCOUNTER — Ambulatory Visit
Admission: RE | Admit: 2019-07-15 | Discharge: 2019-07-15 | Disposition: A | Discharge status: Home / Self Care | Payer: Medicare Other | Source: Ambulatory Visit | Attending: Internal Medicine | Admitting: Internal Medicine

## 2019-07-15 DIAGNOSIS — R7989 Other specified abnormal findings of blood chemistry: Secondary | ICD-10-CM

## 2019-08-08 ENCOUNTER — Encounter (HOSPITAL_COMMUNITY): Payer: Self-pay | Admitting: Emergency Medicine

## 2019-08-08 ENCOUNTER — Other Ambulatory Visit: Payer: Self-pay

## 2019-08-08 ENCOUNTER — Inpatient Hospital Stay (HOSPITAL_COMMUNITY)
Admission: EM | Admit: 2019-08-08 | Discharge: 2019-08-11 | DRG: 872 | Disposition: A | Discharge status: Home / Self Care | Payer: Medicare Other | Attending: Family Medicine | Admitting: Family Medicine

## 2019-08-08 ENCOUNTER — Emergency Department (HOSPITAL_COMMUNITY): Payer: Medicare Other

## 2019-08-08 DIAGNOSIS — R6520 Severe sepsis without septic shock: Secondary | ICD-10-CM | POA: Diagnosis present

## 2019-08-08 DIAGNOSIS — K529 Noninfective gastroenteritis and colitis, unspecified: Secondary | ICD-10-CM | POA: Diagnosis not present

## 2019-08-08 DIAGNOSIS — E872 Acidosis: Secondary | ICD-10-CM | POA: Diagnosis present

## 2019-08-08 DIAGNOSIS — K219 Gastro-esophageal reflux disease without esophagitis: Secondary | ICD-10-CM | POA: Diagnosis present

## 2019-08-08 DIAGNOSIS — E785 Hyperlipidemia, unspecified: Secondary | ICD-10-CM | POA: Diagnosis present

## 2019-08-08 DIAGNOSIS — A419 Sepsis, unspecified organism: Secondary | ICD-10-CM | POA: Diagnosis not present

## 2019-08-08 DIAGNOSIS — N183 Chronic kidney disease, stage 3 unspecified: Secondary | ICD-10-CM | POA: Diagnosis present

## 2019-08-08 DIAGNOSIS — I2510 Atherosclerotic heart disease of native coronary artery without angina pectoris: Secondary | ICD-10-CM | POA: Diagnosis present

## 2019-08-08 DIAGNOSIS — Z87891 Personal history of nicotine dependence: Secondary | ICD-10-CM

## 2019-08-08 DIAGNOSIS — Z7902 Long term (current) use of antithrombotics/antiplatelets: Secondary | ICD-10-CM

## 2019-08-08 DIAGNOSIS — E7800 Pure hypercholesterolemia, unspecified: Secondary | ICD-10-CM | POA: Diagnosis present

## 2019-08-08 DIAGNOSIS — Z79899 Other long term (current) drug therapy: Secondary | ICD-10-CM

## 2019-08-08 DIAGNOSIS — Z955 Presence of coronary angioplasty implant and graft: Secondary | ICD-10-CM

## 2019-08-08 DIAGNOSIS — I255 Ischemic cardiomyopathy: Secondary | ICD-10-CM | POA: Diagnosis present

## 2019-08-08 DIAGNOSIS — K760 Fatty (change of) liver, not elsewhere classified: Secondary | ICD-10-CM | POA: Diagnosis present

## 2019-08-08 DIAGNOSIS — N1831 Chronic kidney disease, stage 3a: Secondary | ICD-10-CM | POA: Diagnosis present

## 2019-08-08 DIAGNOSIS — Z20822 Contact with and (suspected) exposure to covid-19: Secondary | ICD-10-CM | POA: Diagnosis present

## 2019-08-08 DIAGNOSIS — K5732 Diverticulitis of large intestine without perforation or abscess without bleeding: Secondary | ICD-10-CM | POA: Diagnosis present

## 2019-08-08 DIAGNOSIS — K5792 Diverticulitis of intestine, part unspecified, without perforation or abscess without bleeding: Secondary | ICD-10-CM | POA: Diagnosis present

## 2019-08-08 DIAGNOSIS — I130 Hypertensive heart and chronic kidney disease with heart failure and stage 1 through stage 4 chronic kidney disease, or unspecified chronic kidney disease: Secondary | ICD-10-CM | POA: Diagnosis present

## 2019-08-08 DIAGNOSIS — D6959 Other secondary thrombocytopenia: Secondary | ICD-10-CM | POA: Diagnosis present

## 2019-08-08 DIAGNOSIS — I252 Old myocardial infarction: Secondary | ICD-10-CM

## 2019-08-08 DIAGNOSIS — I10 Essential (primary) hypertension: Secondary | ICD-10-CM | POA: Diagnosis present

## 2019-08-08 DIAGNOSIS — I509 Heart failure, unspecified: Secondary | ICD-10-CM | POA: Diagnosis present

## 2019-08-08 LAB — BASIC METABOLIC PANEL
Anion gap: 10 (ref 5–15)
BUN: 18 mg/dL (ref 8–23)
CO2: 25 mmol/L (ref 22–32)
Calcium: 9.8 mg/dL (ref 8.9–10.3)
Chloride: 103 mmol/L (ref 98–111)
Creatinine, Ser: 1.3 mg/dL — ABNORMAL HIGH (ref 0.61–1.24)
GFR calc Af Amer: >60 mL/min (ref >60)
GFR calc non Af Amer: 54 mL/min — ABNORMAL LOW (ref >60)
Glucose, Bld: 112 mg/dL — ABNORMAL HIGH (ref 70–99)
Potassium: 4.1 mmol/L (ref 3.5–5.1)
Sodium: 138 mmol/L (ref 135–145)

## 2019-08-08 LAB — CBC
HCT: 45 % (ref 39.0–52.0)
Hemoglobin: 15.3 g/dL (ref 13.0–17.0)
MCH: 32.3 pg (ref 26.0–34.0)
MCHC: 34 g/dL (ref 30.0–36.0)
MCV: 94.9 fL (ref 80.0–100.0)
Platelets: 237 10*3/uL (ref 150–400)
RBC: 4.74 MIL/uL (ref 4.22–5.81)
RDW: 13.2 % (ref 11.5–15.5)
WBC: 6 10*3/uL (ref 4.0–10.5)
nRBC: 0 % (ref 0.0–0.2)

## 2019-08-08 LAB — TROPONIN I (HIGH SENSITIVITY): Troponin I (High Sensitivity): 13 ng/L (ref <18)

## 2019-08-08 MED ORDER — SODIUM CHLORIDE 0.9% FLUSH
3.0000 mL | Freq: Once | INTRAVENOUS | Status: AC
  Administered 2019-08-09: 3 mL via INTRAVENOUS

## 2019-08-08 NOTE — ED Triage Notes (Signed)
Pt c/o right sided chest pain that radiates to his upper abdomen, with dizziness and shortness of breath.

## 2019-08-09 ENCOUNTER — Emergency Department (HOSPITAL_COMMUNITY): Payer: Medicare Other

## 2019-08-09 ENCOUNTER — Encounter (HOSPITAL_COMMUNITY): Payer: Self-pay | Admitting: Radiology

## 2019-08-09 DIAGNOSIS — K529 Noninfective gastroenteritis and colitis, unspecified: Secondary | ICD-10-CM | POA: Diagnosis present

## 2019-08-09 DIAGNOSIS — Z79899 Other long term (current) drug therapy: Secondary | ICD-10-CM | POA: Diagnosis not present

## 2019-08-09 DIAGNOSIS — I252 Old myocardial infarction: Secondary | ICD-10-CM | POA: Diagnosis not present

## 2019-08-09 DIAGNOSIS — K5732 Diverticulitis of large intestine without perforation or abscess without bleeding: Secondary | ICD-10-CM | POA: Diagnosis present

## 2019-08-09 DIAGNOSIS — A419 Sepsis, unspecified organism: Secondary | ICD-10-CM | POA: Diagnosis present

## 2019-08-09 DIAGNOSIS — Z20822 Contact with and (suspected) exposure to covid-19: Secondary | ICD-10-CM | POA: Diagnosis present

## 2019-08-09 DIAGNOSIS — Z7902 Long term (current) use of antithrombotics/antiplatelets: Secondary | ICD-10-CM | POA: Diagnosis not present

## 2019-08-09 DIAGNOSIS — D6959 Other secondary thrombocytopenia: Secondary | ICD-10-CM | POA: Diagnosis present

## 2019-08-09 DIAGNOSIS — K5792 Diverticulitis of intestine, part unspecified, without perforation or abscess without bleeding: Secondary | ICD-10-CM | POA: Diagnosis not present

## 2019-08-09 DIAGNOSIS — K219 Gastro-esophageal reflux disease without esophagitis: Secondary | ICD-10-CM | POA: Diagnosis present

## 2019-08-09 DIAGNOSIS — I2510 Atherosclerotic heart disease of native coronary artery without angina pectoris: Secondary | ICD-10-CM | POA: Diagnosis present

## 2019-08-09 DIAGNOSIS — E7800 Pure hypercholesterolemia, unspecified: Secondary | ICD-10-CM | POA: Diagnosis present

## 2019-08-09 DIAGNOSIS — I130 Hypertensive heart and chronic kidney disease with heart failure and stage 1 through stage 4 chronic kidney disease, or unspecified chronic kidney disease: Secondary | ICD-10-CM | POA: Diagnosis present

## 2019-08-09 DIAGNOSIS — Z87891 Personal history of nicotine dependence: Secondary | ICD-10-CM | POA: Diagnosis not present

## 2019-08-09 DIAGNOSIS — I509 Heart failure, unspecified: Secondary | ICD-10-CM | POA: Diagnosis present

## 2019-08-09 DIAGNOSIS — I255 Ischemic cardiomyopathy: Secondary | ICD-10-CM | POA: Diagnosis present

## 2019-08-09 DIAGNOSIS — E785 Hyperlipidemia, unspecified: Secondary | ICD-10-CM | POA: Diagnosis present

## 2019-08-09 DIAGNOSIS — K760 Fatty (change of) liver, not elsewhere classified: Secondary | ICD-10-CM | POA: Diagnosis present

## 2019-08-09 DIAGNOSIS — R6520 Severe sepsis without septic shock: Secondary | ICD-10-CM | POA: Diagnosis present

## 2019-08-09 DIAGNOSIS — N1831 Chronic kidney disease, stage 3a: Secondary | ICD-10-CM | POA: Diagnosis present

## 2019-08-09 DIAGNOSIS — E872 Acidosis: Secondary | ICD-10-CM | POA: Diagnosis present

## 2019-08-09 DIAGNOSIS — Z955 Presence of coronary angioplasty implant and graft: Secondary | ICD-10-CM | POA: Diagnosis not present

## 2019-08-09 DIAGNOSIS — I10 Essential (primary) hypertension: Secondary | ICD-10-CM

## 2019-08-09 LAB — URINE CULTURE: Culture: NO GROWTH

## 2019-08-09 LAB — LIPASE, BLOOD: Lipase: 22 U/L (ref 11–51)

## 2019-08-09 LAB — URINALYSIS, ROUTINE W REFLEX MICROSCOPIC
Bacteria, UA: NONE SEEN
Bilirubin Urine: NEGATIVE
Glucose, UA: NEGATIVE mg/dL
Ketones, ur: NEGATIVE mg/dL
Leukocytes,Ua: NEGATIVE
Nitrite: NEGATIVE
Protein, ur: NEGATIVE mg/dL
Specific Gravity, Urine: 1.045 — ABNORMAL HIGH (ref 1.005–1.030)
pH: 5 (ref 5.0–8.0)

## 2019-08-09 LAB — HEPATIC FUNCTION PANEL
ALT: 148 U/L — ABNORMAL HIGH (ref 0–44)
AST: 178 U/L — ABNORMAL HIGH (ref 15–41)
Albumin: 4.2 g/dL (ref 3.5–5.0)
Alkaline Phosphatase: 73 U/L (ref 38–126)
Bilirubin, Direct: 0.8 mg/dL — ABNORMAL HIGH (ref 0.0–0.2)
Indirect Bilirubin: 0.9 mg/dL (ref 0.3–0.9)
Total Bilirubin: 1.7 mg/dL — ABNORMAL HIGH (ref 0.3–1.2)
Total Protein: 7.5 g/dL (ref 6.5–8.1)

## 2019-08-09 LAB — PROTIME-INR
INR: 1.2 (ref 0.8–1.2)
Prothrombin Time: 14.6 seconds (ref 11.4–15.2)

## 2019-08-09 LAB — TROPONIN I (HIGH SENSITIVITY): Troponin I (High Sensitivity): 12 ng/L (ref <18)

## 2019-08-09 LAB — LACTIC ACID, PLASMA
Lactic Acid, Venous: 4 mmol/L (ref 0.5–1.9)
Lactic Acid, Venous: 2.7 mmol/L (ref 0.5–1.9)
Lactic Acid, Venous: 2.6 mmol/L (ref 0.5–1.9)
Lactic Acid, Venous: 2.4 mmol/L (ref 0.5–1.9)
Lactic Acid, Venous: 2.1 mmol/L (ref 0.5–1.9)
Lactic Acid, Venous: 1.5 mmol/L (ref 0.5–1.9)

## 2019-08-09 LAB — CBG MONITORING, ED: Glucose-Capillary: 145 mg/dL — ABNORMAL HIGH (ref 70–99)

## 2019-08-09 LAB — SARS CORONAVIRUS 2 BY RT PCR (HOSPITAL ORDER, PERFORMED IN ~~LOC~~ HOSPITAL LAB): SARS Coronavirus 2: NEGATIVE

## 2019-08-09 LAB — APTT: aPTT: 23 seconds — ABNORMAL LOW (ref 24–36)

## 2019-08-09 LAB — PROCALCITONIN: Procalcitonin: 4.27 ng/mL

## 2019-08-09 MED ORDER — VANCOMYCIN HCL IN DEXTROSE 1-5 GM/200ML-% IV SOLN: 1000.0000 mg | Freq: Once | INTRAVENOUS | Status: DC

## 2019-08-09 MED ORDER — ENOXAPARIN SODIUM 40 MG/0.4ML ~~LOC~~ SOLN
40.0000 mg | SUBCUTANEOUS | Status: DC
  Administered 2019-08-09 – 2019-08-10 (×2): 40 mg via SUBCUTANEOUS
  Filled 2019-08-09 (×3): qty 0.4

## 2019-08-09 MED ORDER — ONDANSETRON HCL 4 MG/2ML IJ SOLN
4.0000 mg | Freq: Once | INTRAMUSCULAR | Status: AC
  Administered 2019-08-09: 4 mg via INTRAVENOUS
  Filled 2019-08-09: qty 2

## 2019-08-09 MED ORDER — ACETAMINOPHEN 325 MG PO TABS
650.0000 mg | ORAL_TABLET | Freq: Once | ORAL | Status: AC
  Administered 2019-08-09: 650 mg via ORAL
  Filled 2019-08-09: qty 2

## 2019-08-09 MED ORDER — CLOPIDOGREL BISULFATE 75 MG PO TABS
75.0000 mg | ORAL_TABLET | Freq: Every day | ORAL | Status: DC
  Administered 2019-08-09 – 2019-08-11 (×3): 75 mg via ORAL
  Filled 2019-08-09 (×3): qty 1

## 2019-08-09 MED ORDER — VANCOMYCIN HCL 2000 MG/400ML IV SOLN
2000.0000 mg | Freq: Once | INTRAVENOUS | Status: AC
  Administered 2019-08-09: 2000 mg via INTRAVENOUS
  Filled 2019-08-09: qty 400

## 2019-08-09 MED ORDER — ONDANSETRON HCL 4 MG PO TABS: 4.0000 mg | ORAL_TABLET | Freq: Four times a day (QID) | ORAL | Status: DC | PRN

## 2019-08-09 MED ORDER — METRONIDAZOLE IN NACL 5-0.79 MG/ML-% IV SOLN
500.0000 mg | Freq: Once | INTRAVENOUS | Status: AC
  Administered 2019-08-09: 500 mg via INTRAVENOUS
  Filled 2019-08-09: qty 100

## 2019-08-09 MED ORDER — LACTATED RINGERS IV BOLUS (SEPSIS)
1000.0000 mL | Freq: Once | INTRAVENOUS | Status: AC
  Administered 2019-08-09: 1000 mL via INTRAVENOUS

## 2019-08-09 MED ORDER — ONDANSETRON HCL 4 MG/2ML IJ SOLN
4.0000 mg | Freq: Four times a day (QID) | INTRAMUSCULAR | Status: DC | PRN
  Administered 2019-08-09: 4 mg via INTRAVENOUS
  Filled 2019-08-09: qty 2

## 2019-08-09 MED ORDER — SODIUM CHLORIDE 0.9% FLUSH
3.0000 mL | Freq: Two times a day (BID) | INTRAVENOUS | Status: DC
  Administered 2019-08-10: 3 mL via INTRAVENOUS

## 2019-08-09 MED ORDER — IOHEXOL 350 MG/ML SOLN
100.0000 mL | Freq: Once | INTRAVENOUS | Status: AC | PRN
  Administered 2019-08-09: 100 mL via INTRAVENOUS

## 2019-08-09 MED ORDER — ACETAMINOPHEN 325 MG PO TABS
650.0000 mg | ORAL_TABLET | Freq: Four times a day (QID) | ORAL | Status: DC | PRN
  Administered 2019-08-09: 650 mg via ORAL
  Filled 2019-08-09: qty 2

## 2019-08-09 MED ORDER — PANTOPRAZOLE SODIUM 40 MG PO TBEC
40.0000 mg | DELAYED_RELEASE_TABLET | Freq: Every day | ORAL | Status: DC
  Administered 2019-08-09 – 2019-08-11 (×3): 40 mg via ORAL
  Filled 2019-08-09 (×3): qty 1

## 2019-08-09 MED ORDER — ACETAMINOPHEN 650 MG RE SUPP: 650.0000 mg | Freq: Four times a day (QID) | RECTAL | Status: DC | PRN

## 2019-08-09 MED ORDER — OMEGA-3-ACID ETHYL ESTERS 1 G PO CAPS
1.0000 g | ORAL_CAPSULE | Freq: Every day | ORAL | Status: DC
  Administered 2019-08-09 – 2019-08-11 (×3): 1 g via ORAL
  Filled 2019-08-09 (×3): qty 1

## 2019-08-09 MED ORDER — SACUBITRIL-VALSARTAN 97-103 MG PO TABS
1.0000 | ORAL_TABLET | Freq: Two times a day (BID) | ORAL | Status: DC
  Administered 2019-08-09 – 2019-08-11 (×5): 1 via ORAL
  Filled 2019-08-09 (×6): qty 1

## 2019-08-09 MED ORDER — ZOLPIDEM TARTRATE 5 MG PO TABS
5.0000 mg | ORAL_TABLET | Freq: Every evening | ORAL | Status: DC | PRN
  Administered 2019-08-11: 5 mg via ORAL
  Filled 2019-08-09: qty 1

## 2019-08-09 MED ORDER — DICYCLOMINE HCL 20 MG PO TABS
20.0000 mg | ORAL_TABLET | Freq: Two times a day (BID) | ORAL | Status: DC
  Administered 2019-08-09 – 2019-08-11 (×5): 20 mg via ORAL
  Filled 2019-08-09 (×5): qty 1

## 2019-08-09 MED ORDER — CARVEDILOL 3.125 MG PO TABS
3.1250 mg | ORAL_TABLET | Freq: Two times a day (BID) | ORAL | Status: DC
  Administered 2019-08-09: 3.125 mg via ORAL
  Filled 2019-08-09 (×5): qty 1

## 2019-08-09 MED ORDER — LACTATED RINGERS IV SOLN: INTRAVENOUS | Status: DC

## 2019-08-09 MED ORDER — SODIUM CHLORIDE 0.9 % IV SOLN
2.0000 g | Freq: Once | INTRAVENOUS | Status: AC
  Administered 2019-08-09: 2 g via INTRAVENOUS
  Filled 2019-08-09: qty 2

## 2019-08-09 MED ORDER — METRONIDAZOLE IN NACL 5-0.79 MG/ML-% IV SOLN
500.0000 mg | Freq: Three times a day (TID) | INTRAVENOUS | Status: DC
  Administered 2019-08-09 – 2019-08-11 (×7): 500 mg via INTRAVENOUS
  Filled 2019-08-09 (×7): qty 100

## 2019-08-09 MED ORDER — SODIUM CHLORIDE 0.9 % IV SOLN
2.0000 g | Freq: Two times a day (BID) | INTRAVENOUS | Status: DC
  Administered 2019-08-09 – 2019-08-10 (×3): 2 g via INTRAVENOUS
  Filled 2019-08-09 (×4): qty 2

## 2019-08-09 NOTE — ED Provider Notes (Addendum)
[facility] EMERGENCY DEPARTMENT Provider Note   CSN: [id number] Arrival date & time: 08/08/19  2136     History Chief Complaint  Patient presents with  . Chest Pain    [name] is a 74 y.o. male.  Patient presents to the emergency department for evaluation of right-sided chest pain.  Patient reports that symptoms began earlier today and have been persistent.  He has not noticed any cough but has been short of breath.  Pain radiates down into the right side of his abdomen.  He has had nausea but no vomiting.  He has felt constipated.        Past Medical History:  Diagnosis Date  . Anxiety   . Arthritis    "wrists" (07/27/2014)  . Coronary artery disease    a.  Pt with 4 stents over the course of 2003-2012;  b.  LHC 6/16:  LAD, RCA, LCx stents patent, OM1 50%, EF 50-55% >> med rx   . Elevated LFTs   . GERD (gastroesophageal reflux disease)   . Hypercholesterolemia   . Hypertension   . Ischemic cardiomyopathy    cMRI 03/2011:  EF 48% with dist ant, septal, apical and inf-apical dyskinesia, no apical clot, dist ant, apical and septal full thickness scar >> No ICD needed  //  Limited echo 7/19: Large apical septal aneurysm, severe hypokinesis of the entire apex and mid to apical segments of the anterior wall, anterolateral wall and anterior septum-consistent with infarct and most of the LAD territory; EF 35   . Left ventricular aneurysm    Limited echo 7/19: Large apical septal aneurysm, severe hypokinesis of the entire apex and mid to apical segments of the anterior wall, anterolateral wall and anterior septum-consistent with infarct and most of the LAD territory; EF 35  . Myocardial infarction [facility]) 2003    Patient Active Problem List   Diagnosis Date Noted  . Sepsis ([place]) 08/09/2019  . History of GI bleed 11/03/2017  . CKD (chronic kidney disease), stage III 10/19/2017  . Diverticulitis 10/19/2017  . Chest pain   . Low testosterone 06/02/2014  .  Unstable angina ([place]) 06/11/2012  . CAD S/P percutaneous coronary angioplasty 01/07/2011    Class: Chronic  . Angina pectoris 01/07/2011    Class: Acute  . Hyperlipidemia with target LDL less than 100 01/07/2011    Class: Chronic  . Hypertension 01/07/2011    Class: Chronic  . Ischemic cardiomyopathy 01/07/2011    Class: Chronic  . Previous myocardial infarction older than 8 weeks 01/07/2011    Class: History of    Past Surgical History:  Procedure Laterality Date  . CARDIAC CATHETERIZATION  06/11/2012   Nonobstructive CAD, patent stents, EF 50%, mild apical dyskinesis  . CARDIAC CATHETERIZATION N/A 07/28/2014   Procedure: Left Heart Cath and Coronary Angiography;  Surgeon: Troy Sine, MD;  Location: [place] [facility];  Service: Cardiovascular;  Laterality: N/A;  . CORONARY ANGIOPLASTY WITH STENT PLACEMENT  2003-2012   total of 4 stents place  . CORONARY STENT INTERVENTION N/A 10/20/2017   Procedure: CORONARY STENT INTERVENTION;  Surgeon: Sherren Mocha, MD;  Location: [place] [facility];  Service: Cardiovascular;  Laterality: N/A;  . COSMETIC SURGERY Left 1970's   "dog ripped of my ear"  . LEFT HEART CATH AND CORONARY ANGIOGRAPHY N/A 10/20/2017   Procedure: LEFT HEART CATH AND CORONARY ANGIOGRAPHY;  Surgeon: Sherren Mocha, MD;  Location: [place] [facility];  Service: Cardiovascular;  Laterality: N/A;  .  LEFT HEART CATHETERIZATION WITH CORONARY ANGIOGRAM N/A 06/11/2012   Procedure: LEFT HEART CATHETERIZATION WITH CORONARY ANGIOGRAM;  Surgeon: Peter M Martinique, MD;  Location: [facility] CATH LAB;  Service: Cardiovascular;  Laterality: N/A;       Family History  Problem Relation Age of Onset  . Coronary artery disease Mother   . Coronary artery disease Father     Social History   Tobacco Use  . Smoking status: Former Smoker    Packs/day: 1.00    Years: 15.00    Pack years: 15.00    Types: Cigarettes  . Smokeless tobacco: Never Used  . Tobacco comment: "quit smoking  cigarettes in 1979"  Vaping Use  . Vaping Use: Never used  Substance Use Topics  . Alcohol use: Yes    Alcohol/week: 2.0 standard drinks    Types: 2 Glasses of wine per week  . Drug use: No    Home Medications Prior to Admission medications   Medication Sig Start Date End Date Taking? Authorizing Provider  carvedilol (COREG) 3.125 MG tablet Take 1 tablet (3.125 mg total) by mouth 2 (two) times daily with a meal. 08/12/18  Yes Josue Hector, MD  clopidogrel (PLAVIX) 75 MG tablet Take 1 tablet (75 mg total) by mouth daily. 06/11/12  Yes Arguello, Roger A, PA-C  dicyclomine (BENTYL) 20 MG tablet Take 20 mg by mouth 2 (two) times daily after a meal.    Yes [provider]  ENTRESTO 97-103 MG TAKE 1 TABLET BY MOUTH TWICE A DAY Patient taking differently: Take 1 tablet by mouth 2 (two) times daily.  06/17/19  Yes Josue Hector, MD  nitroGLYCERIN (NITROSTAT) 0.4 MG SL tablet Place 0.4 mg under the tongue every 5 (five) minutes as needed for chest pain. For a max of 3 doses. If no relief, call 911.   Yes [provider]  omega-3 acid ethyl esters (LOVAZA) 1 G capsule Take 1 g by mouth daily.   Yes [provider]  pantoprazole (PROTONIX) 40 MG tablet Take 1 tablet (40 mg total) by mouth daily. 04/26/12  Yes Josue Hector, MD  zolpidem (AMBIEN) 10 MG tablet Take 1 tablet (10 mg total) by mouth at bedtime as needed. For sleep 07/06/12  Yes Josue Hector, MD  LORazepam (ATIVAN) 0.5 MG tablet Take 1 tablet (0.5 mg total) by mouth every 8 (eight) hours as needed. For anxiety Patient not taking: Reported on 08/09/2019 07/06/12   Josue Hector, MD    Allergies    Asa [aspirin], Penicillins, and Statins  Review of Systems   Review of Systems  Respiratory: Positive for shortness of breath. Negative for cough.   Cardiovascular: Positive for chest pain.  All other systems reviewed and are negative.   Physical Exam Updated Vital Signs BP 120/78   Pulse (!) 107   Temp  100.3 F (37.9 C) (Rectal)   Resp 18   Ht 5\' 7"  (1.702 m)   Wt 84.4 kg   SpO2 92%   BMI 29.13 kg/m   Physical Exam Vitals and nursing note reviewed.  Constitutional:      General: He is not in acute distress.    Appearance: Normal appearance. He is well-developed.  HENT:     Head: Normocephalic and atraumatic.     Right Ear: Hearing normal.     Left Ear: Hearing normal.     Nose: Nose normal.  Eyes:     Conjunctiva/sclera: Conjunctivae normal.     Pupils: Pupils are  equal, round, and reactive to light.  Cardiovascular:     Rate and Rhythm: Regular rhythm. Tachycardia present.     Heart sounds: S1 normal and S2 normal. No murmur heard.  No friction rub. No gallop.   Pulmonary:     Effort: Pulmonary effort is normal. Tachypnea present. No respiratory distress.     Breath sounds: Normal breath sounds.  Chest:     Chest wall: No tenderness.  Abdominal:     General: Bowel sounds are normal.     Palpations: Abdomen is soft.     Tenderness: There is abdominal tenderness in the right upper quadrant and right lower quadrant. There is no guarding or rebound. Negative signs include Murphy's sign and McBurney's sign.     Hernia: No hernia is present.  Musculoskeletal:        General: Normal range of motion.     Cervical back: Normal range of motion and neck supple.  Skin:    General: Skin is warm and dry.     Findings: No rash.  Neurological:     Mental Status: He is alert and oriented to person, place, and time.     GCS: GCS eye subscore is 4. GCS verbal subscore is 5. GCS motor subscore is 6.     Cranial Nerves: No cranial nerve deficit.     Sensory: No sensory deficit.     Coordination: Coordination normal.  Psychiatric:        Speech: Speech normal.        Behavior: Behavior normal.        Thought Content: Thought content normal.     ED Results / Procedures / Treatments   Labs (all labs ordered are listed, but only abnormal results are displayed) Labs Reviewed    BASIC METABOLIC PANEL - Abnormal; Notable for the following components:      Result Value   Glucose, Bld 112 (*)    Creatinine, Ser 1.30 (*)    GFR calc non Af Amer 54 (*)    All other components within normal limits  LACTIC ACID, PLASMA - Abnormal; Notable for the following components:   Lactic Acid, Venous 4.0 (*)    All other components within normal limits  LACTIC ACID, PLASMA - Abnormal; Notable for the following components:   Lactic Acid, Venous 2.7 (*)    All other components within normal limits  APTT - Abnormal; Notable for the following components:   aPTT 23 (*)    All other components within normal limits  URINALYSIS, ROUTINE W REFLEX MICROSCOPIC - Abnormal; Notable for the following components:   Specific Gravity, Urine 1.045 (*)    Hgb urine dipstick MODERATE (*)    All other components within normal limits  HEPATIC FUNCTION PANEL - Abnormal; Notable for the following components:   AST 178 (*)    ALT 148 (*)    Total Bilirubin 1.7 (*)    Bilirubin, Direct 0.8 (*)    All other components within normal limits  CBG MONITORING, ED - Abnormal; Notable for the following components:   Glucose-Capillary 145 (*)    All other components within normal limits  SARS CORONAVIRUS 2 BY RT PCR (HOSPITAL ORDER, [facility])  CULTURE, BLOOD (ROUTINE X 2)  CULTURE, BLOOD (ROUTINE X 2)  URINE CULTURE  CBC  PROTIME-INR  LIPASE, BLOOD  TROPONIN I (HIGH SENSITIVITY)  TROPONIN I (HIGH SENSITIVITY)    EKG EKG Interpretation  Date/Time:  Monday August 08 2019 21:39:03  EDT Ventricular Rate:  72 PR Interval:  182 QRS Duration: 92 QT Interval:  364 QTC Calculation: 398 R Axis:   -70 Text Interpretation: Normal sinus rhythm Left anterior fascicular block Anterolateral infarct , age undetermined Abnormal ECG Confirmed by Orpah Greek [phone]) on 08/09/2019 4:44:43 AM   Radiology DG Chest 2 View  Result Date: 08/08/2019 CLINICAL DATA:  Right side  chest pain EXAM: CHEST - 2 VIEW COMPARISON:  10/19/2017 FINDINGS: The heart size and mediastinal contours are within normal limits. Both lungs are clear. The visualized skeletal structures are unremarkable. IMPRESSION: No active cardiopulmonary disease. Electronically Signed   By: Rolm Baptise M.D.   On: 08/08/2019 22:36   CT ANGIO CHEST PE W OR WO CONTRAST  Result Date: 08/09/2019 CLINICAL DATA:  Right-sided chest pain and upper abdomen pain EXAM: CT ANGIOGRAPHY CHEST WITH CONTRAST TECHNIQUE: Multidetector CT imaging of the chest was performed using the standard protocol during bolus administration of intravenous contrast. Multiplanar CT image reconstructions and MIPs were obtained to evaluate the vascular anatomy. CONTRAST:  167mL OMNIPAQUE IOHEXOL 350 MG/ML SOLN COMPARISON:  None. FINDINGS: Cardiovascular: There is a optimal opacification of the pulmonary arteries. There is no central,segmental, or subsegmental filling defects within the pulmonary arteries. The heart is normal in size. No pericardial effusion or thickening. No evidence right heart strain. There is normal three-vessel brachiocephalic anatomy without proximal stenosis. Scattered aortic atherosclerosis is noted. Coronary artery calcifications are present. Mediastinum/Nodes: No hilar, mediastinal, or axillary adenopathy. Thyroid gland, trachea, and esophagus demonstrate no significant findings. Lungs/Pleura: The lungs are clear. No pleural effusion or pneumothorax. No airspace consolidation. Upper Abdomen: No acute abnormalities present in the visualized portions of the upper abdomen. Musculoskeletal: No chest wall abnormality. No acute or significant osseous findings. Anterior flowing osteophytes in the lower thoracic spine. Review of the MIP images confirms the above findings. Abdomen/pelvis: Hepatobiliary: There is diffuse low density seen throughout the liver parenchyma. No focal hepatic lesion is seen. No intra or extrahepatic biliary ductal  dilatation. The main portal vein is patent. No evidence of calcified gallstones, gallbladder wall thickening or biliary dilatation. Pancreas: Unremarkable. No pancreatic ductal dilatation or surrounding inflammatory changes. Spleen: Normal in size without focal abnormality. Adrenals/Urinary Tract: Both adrenal glands appear normal. The kidneys and collecting system appear normal without evidence of urinary tract calculus or hydronephrosis. Bladder is unremarkable. Stomach/Bowel: The stomach and small bowel normal in appearance. There is extensive colonic diverticulosis. There is question of mild wall thickening versus under distension seen within the redundant sigmoid colon, series 5, image 54. No significant surrounding fat stranding changes however are noted. Vascular/Lymphatic: There are no enlarged mesenteric, retroperitoneal, or pelvic lymph nodes. Scattered aortic atherosclerotic calcifications are seen without aneurysmal dilatation. Reproductive: A heterogeneously enlarged prostate gland is noted which protrudes again seen posterior bladder. Other: No evidence of abdominal wall mass or hernia. Musculoskeletal: No acute or significant osseous findings. IMPRESSION: No central, segmental, or subsegmental pulmonary embolism Apparent mild wall thickening seen within the redundant sigmoid colon with extensive diverticulosis. This could be due to underdistention versus true wall thickening/mild colitis. Hepatic steatosis Aortic Atherosclerosis (ICD10-I70.0). Electronically Signed   By: Prudencio Pair M.D.   On: 08/09/2019 04:33   CT ABDOMEN PELVIS W CONTRAST  Result Date: 08/09/2019 CLINICAL DATA:  Right-sided chest pain and upper abdomen pain EXAM: CT ANGIOGRAPHY CHEST WITH CONTRAST TECHNIQUE: Multidetector CT imaging of the chest was performed using the standard protocol during bolus administration of intravenous contrast. Multiplanar CT image reconstructions and MIPs  were obtained to evaluate the vascular  anatomy. CONTRAST:  112mL OMNIPAQUE IOHEXOL 350 MG/ML SOLN COMPARISON:  None. FINDINGS: Cardiovascular: There is a optimal opacification of the pulmonary arteries. There is no central,segmental, or subsegmental filling defects within the pulmonary arteries. The heart is normal in size. No pericardial effusion or thickening. No evidence right heart strain. There is normal three-vessel brachiocephalic anatomy without proximal stenosis. Scattered aortic atherosclerosis is noted. Coronary artery calcifications are present. Mediastinum/Nodes: No hilar, mediastinal, or axillary adenopathy. Thyroid gland, trachea, and esophagus demonstrate no significant findings. Lungs/Pleura: The lungs are clear. No pleural effusion or pneumothorax. No airspace consolidation. Upper Abdomen: No acute abnormalities present in the visualized portions of the upper abdomen. Musculoskeletal: No chest wall abnormality. No acute or significant osseous findings. Anterior flowing osteophytes in the lower thoracic spine. Review of the MIP images confirms the above findings. Abdomen/pelvis: Hepatobiliary: There is diffuse low density seen throughout the liver parenchyma. No focal hepatic lesion is seen. No intra or extrahepatic biliary ductal dilatation. The main portal vein is patent. No evidence of calcified gallstones, gallbladder wall thickening or biliary dilatation. Pancreas: Unremarkable. No pancreatic ductal dilatation or surrounding inflammatory changes. Spleen: Normal in size without focal abnormality. Adrenals/Urinary Tract: Both adrenal glands appear normal. The kidneys and collecting system appear normal without evidence of urinary tract calculus or hydronephrosis. Bladder is unremarkable. Stomach/Bowel: The stomach and small bowel normal in appearance. There is extensive colonic diverticulosis. There is question of mild wall thickening versus under distension seen within the redundant sigmoid colon, series 5, image 54. No significant  surrounding fat stranding changes however are noted. Vascular/Lymphatic: There are no enlarged mesenteric, retroperitoneal, or pelvic lymph nodes. Scattered aortic atherosclerotic calcifications are seen without aneurysmal dilatation. Reproductive: A heterogeneously enlarged prostate gland is noted which protrudes again seen posterior bladder. Other: No evidence of abdominal wall mass or hernia. Musculoskeletal: No acute or significant osseous findings. IMPRESSION: No central, segmental, or subsegmental pulmonary embolism Apparent mild wall thickening seen within the redundant sigmoid colon with extensive diverticulosis. This could be due to underdistention versus true wall thickening/mild colitis. Hepatic steatosis Aortic Atherosclerosis (ICD10-I70.0). Electronically Signed   By: Prudencio Pair M.D.   On: 08/09/2019 04:33    Procedures Procedures (including critical care time)  Angiocath insertion Performed by: Orpah Greek  Consent: Verbal consent obtained. Risks and benefits: risks, benefits and alternatives were discussed Time out: Immediately prior to procedure a "time out" was called to verify the correct patient, procedure, equipment, support staff and site/side marked as required.  Preparation: Patient was prepped and draped in the usual sterile fashion.  Vein Location: left upper arm  Ultrasound Guided  Gauge: 20  Normal blood return and flush without difficulty Patient tolerance: Patient tolerated the procedure well with no immediate complications.     Medications Ordered in ED Medications  vancomycin (VANCOREADY) IVPB 2000 mg/400 mL (2,000 mg Intravenous New Bag/Given 08/09/19 0441)  lactated ringers bolus 1,000 mL (1,000 mLs Intravenous New Bag/Given 08/09/19 0445)    And  lactated ringers bolus 1,000 mL (has no administration in time range)    And  lactated ringers bolus 1,000 mL (has no administration in time range)  sodium chloride flush (NS) 0.9 % injection 3  mL (3 mLs Intravenous Given 08/09/19 0317)  aztreonam (AZACTAM) 2 g in sodium chloride 0.9 % 100 mL IVPB (0 g Intravenous Stopped 08/09/19 0313)  metroNIDAZOLE (FLAGYL) IVPB 500 mg (0 mg Intravenous Stopped 08/09/19 0428)  ondansetron (ZOFRAN) injection 4 mg (4 mg  Intravenous Given 08/09/19 0237)  acetaminophen (TYLENOL) tablet 650 mg (650 mg Oral Given 08/09/19 0242)  iohexol (OMNIPAQUE) 350 MG/ML injection 100 mL (100 mLs Intravenous Contrast Given 08/09/19 0420)    ED Course  I have reviewed the triage vital signs and the nursing notes.  Pertinent labs & imaging results that were available during my care of the patient were reviewed by me and considered in my medical decision making (see chart for details).    MDM Rules/Calculators/A&P                          Patient presented for evaluation of right-sided chest pain.  He initially was started on chest pain protocol through triage.  Patient was noted to suddenly become tremulous in the waiting room.  He was brought back to an ED room where he was found to be acutely febrile (He had been afebrile at arrival).  Upon examination in the exam room, sepsis was then suspected for the first time.  He did vomit 1 time upon arrival in the exam room.  He was initiated on broad-spectrum antibiotics.  Patient's work-up pressure possible mild colitis but that the only abnormal finding that would explain the fever and abdominal pain.  I did perform a CT angiography of his chest because he was complaining of right-sided chest pain and shortness of breath.  No acute findings were seen in the chest.  Patient's urinalysis does not suggest infection.  He did have a significant lactic acidosis of 4.0 upon arrival.  After fluid resuscitation it is dropping, second lactic acid was 2.7.  CT scan of abdomen did not suggest any evidence of ischemia.  Patient will be admitted to the hospitalist for further management of sepsis and colitis.  Final Clinical Impression(s) /  ED Diagnoses Final diagnoses:  Sepsis with acute organ dysfunction without septic shock, due to unspecified organism, unspecified type [facility])  Colitis    Rx / DC Orders ED Discharge Orders    None       Orpah Greek, MD 08/09/19 2263    Orpah Greek, MD 08/09/19 [phone]

## 2019-08-09 NOTE — H&P (Signed)
History and Physical    Steven Basso NIO:[id number] DOB: 11-10-1945 DOA: 08/08/2019  PCP: Velna Hatchet, MD Consultants:  Johnsie Cancel - cardiology Patient coming from:  Home - lives alone; NOK: Daughter, Lillia Mountain, [phone]; [phone]   Chief Complaint: CP/SOB/rigors  HPI: [name] is a 74 y.o. male with medical history significant of CAD s/p stents; HTN; and HLD presenting with CP/SOB/rigors.  He reports that he developed severe lower abdominal pain that radiated to his chest.  The pain started a few days ago and was mild but last night the pain escalated and was severe.  He thinks it is related to diverticulitis.  He went to the dentist a while ago and he thinks the antibiotics weren't strong enough because that is when he started to feel uncomfortable.  He also has fatty liver.  Last night he was nauseated but no vomiting.  He has been constipated, thinks he might have had a little fecal incontinence here in the ER.  He had fever in the ER but didn't know it.  He had rigors in the waiting room.  He feels a little better now but still feels uncomfortable.     ED Course:  Carryover, per Dr. Marlowe Sax:  Presented with complaints of right-sided chest pain and shortness of breath. Started having rigors while in the waiting room. Found to be febrile and tachycardic. Lactate elevated at 4. Troponin x2 -. CT angiogram negative for PE or pneumonia. CT abdomen showing evidence of mild colitis. Covid test negative. Patient was given vancomycin, aztreonam, Flagyl, and fluid boluses per sepsis protocol. Lactate improved after fluid.  Review of Systems: As per HPI; otherwise review of systems reviewed and negative.   Ambulatory Status:  Ambulates without assistance  COVID Vaccine Status:  Complete  Past Medical History:  Diagnosis Date  . Anxiety   . Arthritis    "wrists" (07/27/2014)  . Coronary artery disease    a.  Pt with 4 stents over the course of 2003-2012;  b.  LHC  6/16:  LAD, RCA, LCx stents patent, OM1 50%, EF 50-55% >> med rx   . Elevated LFTs   . GERD (gastroesophageal reflux disease)   . Hypercholesterolemia   . Hypertension   . Ischemic cardiomyopathy    cMRI 03/2011:  EF 48% with dist ant, septal, apical and inf-apical dyskinesia, no apical clot, dist ant, apical and septal full thickness scar >> No ICD needed  //  Limited echo 7/19: Large apical septal aneurysm, severe hypokinesis of the entire apex and mid to apical segments of the anterior wall, anterolateral wall and anterior septum-consistent with infarct and most of the LAD territory; EF 35   . Left ventricular aneurysm    Limited echo 7/19: Large apical septal aneurysm, severe hypokinesis of the entire apex and mid to apical segments of the anterior wall, anterolateral wall and anterior septum-consistent with infarct and most of the LAD territory; EF 35  . Myocardial infarction [facility]) 2003    Past Surgical History:  Procedure Laterality Date  . CARDIAC CATHETERIZATION  06/11/2012   Nonobstructive CAD, patent stents, EF 50%, mild apical dyskinesis  . CARDIAC CATHETERIZATION N/A 07/28/2014   Procedure: Left Heart Cath and Coronary Angiography;  Surgeon: Troy Sine, MD;  Location: [facility];  Service: Cardiovascular;  Laterality: N/A;  . CORONARY ANGIOPLASTY WITH STENT PLACEMENT  2003-2012   total of 4 stents place  . CORONARY STENT INTERVENTION N/A 10/20/2017   Procedure: CORONARY STENT INTERVENTION;  Surgeon: Sherren Mocha,  MD;  Location: [place] [facility];  Service: Cardiovascular;  Laterality: N/A;  . COSMETIC SURGERY Left 1970's   "dog ripped of my ear"  . LEFT HEART CATH AND CORONARY ANGIOGRAPHY N/A 10/20/2017   Procedure: LEFT HEART CATH AND CORONARY ANGIOGRAPHY;  Surgeon: Sherren Mocha, MD;  Location: [place] [facility];  Service: Cardiovascular;  Laterality: N/A;  . LEFT HEART CATHETERIZATION WITH CORONARY ANGIOGRAM N/A 06/11/2012   Procedure: LEFT HEART  CATHETERIZATION WITH CORONARY ANGIOGRAM;  Surgeon: Peter M Martinique, MD;  Location: [facility];  Service: Cardiovascular;  Laterality: N/A;    Social History   Socioeconomic History  . Marital status: Divorced    Spouse name: Not on file  . Number of children: Not on file  . Years of education: Not on file  . Highest education level: Not on file  Occupational History  . Occupation: retired  Tobacco Use  . Smoking status: Former Smoker    Packs/day: 1.00    Years: 15.00    Pack years: 15.00    Types: Cigarettes  . Smokeless tobacco: Never Used  . Tobacco comment: "quit smoking cigarettes in 1979"  Vaping Use  . Vaping Use: Never used  Substance and Sexual Activity  . Alcohol use: Not Currently    Alcohol/week: 2.0 standard drinks    Types: 2 Glasses of wine per week  . Drug use: No  . Sexual activity: Not Currently  Other Topics Concern  . Not on file  Social History Narrative  . Not on file   Social Determinants of Health   Financial Resource Strain:   . Difficulty of Paying Living Expenses:   Food Insecurity:   . Worried About Charity fundraiser in the Last Year:   . Arboriculturist in the Last Year:   Transportation Needs:   . Film/video editor (Medical):   Marland Kitchen Lack of Transportation (Non-Medical):   Physical Activity:   . Days of Exercise per Week:   . Minutes of Exercise per Session:   Stress:   . Feeling of Stress :   Social Connections:   . Frequency of Communication with Friends and Family:   . Frequency of Social Gatherings with Friends and Family:   . Attends Religious Services:   . Active Member of Clubs or Organizations:   . Attends Archivist Meetings:   Marland Kitchen Marital Status:   Intimate Partner Violence:   . Fear of Current or Ex-Partner:   . Emotionally Abused:   Marland Kitchen Physically Abused:   . Sexually Abused:     Allergies  Allergen Reactions  . Asa [Aspirin] Other (See Comments)    Abdominal bleeding  . Penicillins Anaphylaxis     Has patient had a PCN reaction causing immediate rash, facial/tongue/throat swelling, SOB or lightheadedness with hypotension: Yes Has patient had a PCN reaction causing severe rash involving mucus membranes or skin necrosis: No Has patient had a PCN reaction that required hospitalization: No Has patient had a PCN reaction occurring within the last 10 years: No If all of the above answers are "NO", then may proceed with Cephalosporin use.  . Statins Other (See Comments)    Stiff joints, muscle tightness, couldn't walk    Family History  Problem Relation Age of Onset  . Coronary artery disease Mother   . Coronary artery disease Father     Prior to Admission medications   Medication Sig Start Date End Date Taking? Authorizing Provider  carvedilol (COREG) 3.125 MG  tablet Take 1 tablet (3.125 mg total) by mouth 2 (two) times daily with a meal. 08/12/18  Yes Josue Hector, MD  clopidogrel (PLAVIX) 75 MG tablet Take 1 tablet (75 mg total) by mouth daily. 06/11/12  Yes Arguello, Roger A, PA-C  dicyclomine (BENTYL) 20 MG tablet Take 20 mg by mouth 2 (two) times daily after a meal.    Yes [provider]  ENTRESTO 97-103 MG TAKE 1 TABLET BY MOUTH TWICE A DAY Patient taking differently: Take 1 tablet by mouth 2 (two) times daily.  06/17/19  Yes Josue Hector, MD  nitroGLYCERIN (NITROSTAT) 0.4 MG SL tablet Place 0.4 mg under the tongue every 5 (five) minutes as needed for chest pain. For a max of 3 doses. If no relief, call 911.   Yes [provider]  omega-3 acid ethyl esters (LOVAZA) 1 G capsule Take 1 g by mouth daily.   Yes [provider]  pantoprazole (PROTONIX) 40 MG tablet Take 1 tablet (40 mg total) by mouth daily. 04/26/12  Yes Josue Hector, MD  zolpidem (AMBIEN) 10 MG tablet Take 1 tablet (10 mg total) by mouth at bedtime as needed. For sleep 07/06/12  Yes Josue Hector, MD  LORazepam (ATIVAN) 0.5 MG tablet Take 1 tablet (0.5 mg total) by mouth every 8 (eight)  hours as needed. For anxiety Patient not taking: Reported on 08/09/2019 07/06/12   Josue Hector, MD    Physical Exam: Vitals:   08/09/19 1231 08/09/19 1426 08/09/19 1524 08/09/19 1556  BP: 137/73 138/68 135/66 116/66  Pulse: 70 66 71 60  Resp: (!) 23 16 20 18   Temp:   98.7 F (37.1 C) 99.8 F (37.7 C)  TempSrc:   Oral Oral  SpO2: 99% 100% 99% 100%  Weight:      Height:         . General:  Appears calm and comfortable and is NAD . Eyes:  PERRL, EOMI, normal lids, iris . ENT:  grossly normal hearing, lips & tongue, mmm; poor dentition . Neck:  no LAD, masses or thyromegaly . Cardiovascular:  RR with tachycardia, no m/r/g. No LE edema.  Marland Kitchen Respiratory:   CTA bilaterally with no wheezes/rales/rhonchi.  Normal respiratory effort. . Abdomen:  soft, diffusely TTP, ND, NABS . Skin:  no rash or induration seen on limited exam . Musculoskeletal:  grossly normal tone BUE/BLE, good ROM, no bony abnormality . Psychiatric:  grossly normal mood and affect, speech fluent and appropriate, AOx3 Neurologic:  CN 2-12 grossly intact, moves all extremities in coordinated fashion   Radiological Exams on Admission: DG Chest 2 View  Result Date: 08/08/2019 CLINICAL DATA:  Right side chest pain EXAM: CHEST - 2 VIEW COMPARISON:  10/19/2017 FINDINGS: The heart size and mediastinal contours are within normal limits. Both lungs are clear. The visualized skeletal structures are unremarkable. IMPRESSION: No active cardiopulmonary disease. Electronically Signed   By: Rolm Baptise M.D.   On: 08/08/2019 22:36   CT ANGIO CHEST PE W OR WO CONTRAST  Result Date: 08/09/2019 CLINICAL DATA:  Right-sided chest pain and upper abdomen pain EXAM: CT ANGIOGRAPHY CHEST WITH CONTRAST TECHNIQUE: Multidetector CT imaging of the chest was performed using the standard protocol during bolus administration of intravenous contrast. Multiplanar CT image reconstructions and MIPs were obtained to evaluate the vascular anatomy.  CONTRAST:  131mL OMNIPAQUE IOHEXOL 350 MG/ML SOLN COMPARISON:  None. FINDINGS: Cardiovascular: There is a optimal opacification of the pulmonary arteries. There is no central,segmental, or  subsegmental filling defects within the pulmonary arteries. The heart is normal in size. No pericardial effusion or thickening. No evidence right heart strain. There is normal three-vessel brachiocephalic anatomy without proximal stenosis. Scattered aortic atherosclerosis is noted. Coronary artery calcifications are present. Mediastinum/Nodes: No hilar, mediastinal, or axillary adenopathy. Thyroid gland, trachea, and esophagus demonstrate no significant findings. Lungs/Pleura: The lungs are clear. No pleural effusion or pneumothorax. No airspace consolidation. Upper Abdomen: No acute abnormalities present in the visualized portions of the upper abdomen. Musculoskeletal: No chest wall abnormality. No acute or significant osseous findings. Anterior flowing osteophytes in the lower thoracic spine. Review of the MIP images confirms the above findings. Abdomen/pelvis: Hepatobiliary: There is diffuse low density seen throughout the liver parenchyma. No focal hepatic lesion is seen. No intra or extrahepatic biliary ductal dilatation. The main portal vein is patent. No evidence of calcified gallstones, gallbladder wall thickening or biliary dilatation. Pancreas: Unremarkable. No pancreatic ductal dilatation or surrounding inflammatory changes. Spleen: Normal in size without focal abnormality. Adrenals/Urinary Tract: Both adrenal glands appear normal. The kidneys and collecting system appear normal without evidence of urinary tract calculus or hydronephrosis. Bladder is unremarkable. Stomach/Bowel: The stomach and small bowel normal in appearance. There is extensive colonic diverticulosis. There is question of mild wall thickening versus under distension seen within the redundant sigmoid colon, series 5, image 54. No significant  surrounding fat stranding changes however are noted. Vascular/Lymphatic: There are no enlarged mesenteric, retroperitoneal, or pelvic lymph nodes. Scattered aortic atherosclerotic calcifications are seen without aneurysmal dilatation. Reproductive: A heterogeneously enlarged prostate gland is noted which protrudes again seen posterior bladder. Other: No evidence of abdominal wall mass or hernia. Musculoskeletal: No acute or significant osseous findings. IMPRESSION: No central, segmental, or subsegmental pulmonary embolism Apparent mild wall thickening seen within the redundant sigmoid colon with extensive diverticulosis. This could be due to underdistention versus true wall thickening/mild colitis. Hepatic steatosis Aortic Atherosclerosis (ICD10-I70.0). Electronically Signed   By: Prudencio Pair M.D.   On: 08/09/2019 04:33   CT ABDOMEN PELVIS W CONTRAST  Result Date: 08/09/2019 CLINICAL DATA:  Right-sided chest pain and upper abdomen pain EXAM: CT ANGIOGRAPHY CHEST WITH CONTRAST TECHNIQUE: Multidetector CT imaging of the chest was performed using the standard protocol during bolus administration of intravenous contrast. Multiplanar CT image reconstructions and MIPs were obtained to evaluate the vascular anatomy. CONTRAST:  135mL OMNIPAQUE IOHEXOL 350 MG/ML SOLN COMPARISON:  None. FINDINGS: Cardiovascular: There is a optimal opacification of the pulmonary arteries. There is no central,segmental, or subsegmental filling defects within the pulmonary arteries. The heart is normal in size. No pericardial effusion or thickening. No evidence right heart strain. There is normal three-vessel brachiocephalic anatomy without proximal stenosis. Scattered aortic atherosclerosis is noted. Coronary artery calcifications are present. Mediastinum/Nodes: No hilar, mediastinal, or axillary adenopathy. Thyroid gland, trachea, and esophagus demonstrate no significant findings. Lungs/Pleura: The lungs are clear. No pleural effusion or  pneumothorax. No airspace consolidation. Upper Abdomen: No acute abnormalities present in the visualized portions of the upper abdomen. Musculoskeletal: No chest wall abnormality. No acute or significant osseous findings. Anterior flowing osteophytes in the lower thoracic spine. Review of the MIP images confirms the above findings. Abdomen/pelvis: Hepatobiliary: There is diffuse low density seen throughout the liver parenchyma. No focal hepatic lesion is seen. No intra or extrahepatic biliary ductal dilatation. The main portal vein is patent. No evidence of calcified gallstones, gallbladder wall thickening or biliary dilatation. Pancreas: Unremarkable. No pancreatic ductal dilatation or surrounding inflammatory changes. Spleen: Normal in size without focal  abnormality. Adrenals/Urinary Tract: Both adrenal glands appear normal. The kidneys and collecting system appear normal without evidence of urinary tract calculus or hydronephrosis. Bladder is unremarkable. Stomach/Bowel: The stomach and small bowel normal in appearance. There is extensive colonic diverticulosis. There is question of mild wall thickening versus under distension seen within the redundant sigmoid colon, series 5, image 54. No significant surrounding fat stranding changes however are noted. Vascular/Lymphatic: There are no enlarged mesenteric, retroperitoneal, or pelvic lymph nodes. Scattered aortic atherosclerotic calcifications are seen without aneurysmal dilatation. Reproductive: A heterogeneously enlarged prostate gland is noted which protrudes again seen posterior bladder. Other: No evidence of abdominal wall mass or hernia. Musculoskeletal: No acute or significant osseous findings. IMPRESSION: No central, segmental, or subsegmental pulmonary embolism Apparent mild wall thickening seen within the redundant sigmoid colon with extensive diverticulosis. This could be due to underdistention versus true wall thickening/mild colitis. Hepatic steatosis  Aortic Atherosclerosis (ICD10-I70.0). Electronically Signed   By: Prudencio Pair M.D.   On: 08/09/2019 04:33    EKG: Independently reviewed.  NSR with rate 72; LAFB; no evidence of acute ischemia   Labs on Admission: I have personally reviewed the available labs and imaging studies at the time of the admission.  Pertinent labs:   Glucose 112 BUN 18/Creatinine 1.30/GFR 54 AST 178/ALT 148/Bili 1.7 Lactate 4.0, 2.7, 2.4 Procalcitonin 4.27 HS troponin 13, 12 Normal CBC UA: moderate Hgb Blood and urine cultures pending   Assessment/Plan Principal Problem:   Sepsis (HCC) Active Problems:   Hyperlipidemia with target LDL less than 100   Hypertension   CKD (chronic kidney disease), stage III   Diverticulitis   Sepsis due to diverticulitis -SIRS criteria in this patient includes: Tachycardia, tachypnea  -Patient has evidence of acute organ failure with elevated lactate >2 that is not easily explained by another condition. -While awaiting blood cultures, this appears to be a preseptic condition. -Sepsis protocol initiated -Patient had initial lactate >4 or SBP <90/MAP <65 and so has received the 30 cc/kg IVF bolus. -Suspected source is diverticulitis -For now, will give bowel rest (only clears), IVF, Zofran, and treat with Cefepime and Flagyl for intraabdominal infection (reported to have anaphylaxis to PCN but pharmacist reviewed and reports mild interaction) -If not improving, he may need GI/surgery consultation -Will continue to trend lactate -Blood and urine cultures pending -Will admit with telemetry and continue to monitor -Will sepsis protocol procalcitonin level.  Antibiotics would not be indicated for PCT <0.1 and probably should not be used for < 0.25.  >0.5 indicates infection and >>0.5 indicates more serious disease.  As the procalcitonin level normalizes, it will be reasonable to consider de-escalation of antibiotic coverage.  HTN -Continue Coreg  HLD -Intolerant to  statins -Continue Lovaza  Stage 3a CKD -Appears to be stable at this time -Recheck BMP in AM  CAD -No complaints of angina at this time -Continue Plavix  Chronic CHF -Very abnormal echo with EF about 35% in 08/2017 -He is scheduled for repeat echo on 11/29/19 -Continue Entresto   Note: This patient has been tested and is negative for the novel coronavirus COVID-19.    DVT prophylaxis:  Lovenox Code Status:  Full - confirmed with patient Family Communication: None present  Disposition Plan:  The patient is from: home  Anticipated d/c is to: home without [facility] services  Anticipated d/c date will depend on clinical response to treatment, likely in 1-2 days depending on clinical course  Patient is currently: acutely ill Consults called: None  Admission status: Admit -  It is my clinical opinion that admission to INPATIENT is reasonable and necessary because of the expectation that this patient will require hospital care that crosses at least 2 midnights to treat this condition based on the medical complexity of the problems presented.  Given the aforementioned information, the predictability of an adverse outcome is felt to be significant.    Karmen Bongo MD [facility]   How to contact the [facility] Attending or Consulting provider [place] or covering provider during after hours [place], for this patient?  1. Check the care team in [facility] and look for a) attending/consulting [facility] provider listed and b) the [facility] team listed 2. Log into [email or web address] and use 's universal password to access. If you do not have the password, please contact the hospital operator. 3. Locate the [facility] provider you are looking for under [facility] and page to a number that you can be directly reached. 4. If you still have difficulty reaching the provider, please page the [facility] (Director on Call) for the Hospitalists listed on amion for assistance.   08/09/2019, 5:39 PM

## 2019-08-09 NOTE — Progress Notes (Signed)
Pharmacy Antibiotic Note  [name] is a 74 y.o. male admitted on 08/08/2019 with intra-abdominal infection.  Pharmacy has been consulted for aztreonam dosing.  PCN allergy noted, by pt report likely syncopal episode related to needles rather than true rxn, no tx needed; Has also tolerated Keflex recently and will trial cefepime here.  Flagyl per MD.    Plan: Cefepime 2g IV every 12 hours Monitor renal function, clinical progression and LOT  Height: 5\' 7"  (170.2 cm) Weight: 84.4 kg (186 lb) IBW/kg (Calculated) : 66.1  Temp (24hrs), Avg:99.1 F (37.3 C), Min:97.9 F (36.6 C), Max:100.3 F (37.9 C)  Recent Labs  Lab 08/08/19 2209 08/09/19 0200 08/09/19 0432  WBC 6.0  --   --   CREATININE 1.30*  --   --   LATICACIDVEN  --  4.0* 2.7*    Estimated Creatinine Clearance: 51.8 mL/min (A) (by C-G formula based on SCr of 1.3 mg/dL (H)).    Allergies  Allergen Reactions  . Asa [Aspirin] Other (See Comments)    Abdominal bleeding  . Penicillins Anaphylaxis    Has patient had a PCN reaction causing immediate rash, facial/tongue/throat swelling, SOB or lightheadedness with hypotension: Yes Has patient had a PCN reaction causing severe rash involving mucus membranes or skin necrosis: No Has patient had a PCN reaction that required hospitalization: No Has patient had a PCN reaction occurring within the last 10 years: No If all of the above answers are "NO", then may proceed with Cephalosporin use.  . Statins Other (See Comments)    Stiff joints, muscle tightness, couldn't walk    Bertis Ruddy, PharmD Clinical Pharmacist ED Pharmacist Phone # [phone] 08/09/2019 9:38 AM

## 2019-08-09 NOTE — ED Notes (Signed)
Lunch Tray Ordered @ 1201. 

## 2019-08-09 NOTE — ED Notes (Signed)
Pt ambulatory to and from restroom with steady gait 

## 2019-08-09 NOTE — ED Notes (Signed)
Nurse BILLY RN DRAWING THE BLOOD

## 2019-08-09 NOTE — Progress Notes (Signed)
Code Sepsis Note  LA sent to lab, still processing. Awaiting first result. BC collected, ABX administered. No IVF bolus given at this time. Awaiting LA result.   Jennine Peddy DNP Elink RN 03:53AM

## 2019-08-09 NOTE — ED Notes (Signed)
Patient began shaking uncontrollably in the waiting room. Triage RN notified. Unable to obtain accurate VS at this time due to shaking. Waiting to room patient.

## 2019-08-09 NOTE — ED Notes (Signed)
Pt's L upper arm IV was noted to be infiltrated and swollen. All fluids paused and IV sites rotated to R upper arm after patency was confirmed. Warm compress x2 added to L upper arm.

## 2019-08-09 NOTE — ED Notes (Signed)
Lunch tray at bedside. ?

## 2019-08-10 DIAGNOSIS — A419 Sepsis, unspecified organism: Principal | ICD-10-CM

## 2019-08-10 LAB — BASIC METABOLIC PANEL
Anion gap: 7 (ref 5–15)
BUN: 11 mg/dL (ref 8–23)
CO2: 22 mmol/L (ref 22–32)
Calcium: 8.2 mg/dL — ABNORMAL LOW (ref 8.9–10.3)
Chloride: 108 mmol/L (ref 98–111)
Creatinine, Ser: 1.17 mg/dL (ref 0.61–1.24)
GFR calc Af Amer: >60 mL/min (ref >60)
GFR calc non Af Amer: >60 mL/min (ref >60)
Glucose, Bld: 128 mg/dL — ABNORMAL HIGH (ref 70–99)
Potassium: 3.5 mmol/L (ref 3.5–5.1)
Sodium: 137 mmol/L (ref 135–145)

## 2019-08-10 LAB — CBC
HCT: 39 % (ref 39.0–52.0)
Hemoglobin: 12.9 g/dL — ABNORMAL LOW (ref 13.0–17.0)
MCH: 31.8 pg (ref 26.0–34.0)
MCHC: 33.1 g/dL (ref 30.0–36.0)
MCV: 96.1 fL (ref 80.0–100.0)
Platelets: 134 10*3/uL — ABNORMAL LOW (ref 150–400)
RBC: 4.06 MIL/uL — ABNORMAL LOW (ref 4.22–5.81)
RDW: 13.9 % (ref 11.5–15.5)
WBC: 4.8 10*3/uL (ref 4.0–10.5)
nRBC: 0 % (ref 0.0–0.2)

## 2019-08-10 LAB — PROCALCITONIN: Procalcitonin: 2.85 ng/mL

## 2019-08-10 MED ORDER — CIPROFLOXACIN IN D5W 400 MG/200ML IV SOLN
400.0000 mg | Freq: Two times a day (BID) | INTRAVENOUS | Status: DC
  Administered 2019-08-10 – 2019-08-11 (×3): 400 mg via INTRAVENOUS
  Filled 2019-08-10 (×3): qty 200

## 2019-08-10 NOTE — Evaluation (Signed)
Physical Therapy Evaluation & Discharge Patient Details Name: [name] MRN: [id number] DOB: 1946/02/03 Today's Date: 08/10/2019   History of Present Illness  Pt is a 73 y.o. male admitted 08/08/19 with severe lower abdominal pain that radiated to chest, SOB and rigors. Worked up for sepsis, with suspected source of diverticulitis. PMH includes CAD s/p stents, HTN, HLD, CKD 3, CHF.    Clinical Impression  Patient evaluated by Physical Therapy with no further acute PT needs identified. PTA, pt independent, retired Clinical biochemist, and enjoys staying active. Today, pt independent with mobility. Dynamic Gait Index score of 21/24 does not indicate risk for falls with higher level balance activities. All education has been completed and the patient has no further questions. Acute PT is signing off. Thank you for this referral.    Follow Up Recommendations No PT follow up    Equipment Recommendations  None recommended by PT    Recommendations for Other Services       Precautions / Restrictions Precautions Precautions: None Restrictions Weight Bearing Restrictions: No      Mobility  Bed Mobility Overal bed mobility: Independent                Transfers Overall transfer level: Independent                  Ambulation/Gait Ambulation/Gait assistance: Independent Gait Distance (Feet): 250 Feet Assistive device: IV Pole;None Gait Pattern/deviations: Step-through pattern;Decreased stride length   Gait velocity interpretation: 1.31 - 2.62 ft/sec, indicative of limited community ambulator General Gait Details: Slow, steady gait with and without pushing IV pole, independent without overt instability or LOB. SpO2 97% on RA, HR 83  Stairs Stairs: Yes Stairs assistance: Modified independent (Device/Increase time) Stair Management: One rail Right;Forwards;Alternating pattern Number of Stairs: 4    Wheelchair Mobility    Modified Rankin (Stroke Patients Only)        Balance Overall balance assessment: Independent                               Standardized Balance Assessment Standardized Balance Assessment : Dynamic Gait Index   Dynamic Gait Index Level Surface: Normal Change in Gait Speed: Normal Gait with Horizontal Head Turns: Normal Gait with Vertical Head Turns: Normal Gait and Pivot Turn: Mild Impairment Step Over Obstacle: Mild Impairment Step Around Obstacles: Normal Steps: Mild Impairment Total Score: 21       Pertinent Vitals/Pain Pain Assessment: No/denies pain    Home Living Family/patient expects to be discharged to:: Private residence Living Arrangements: Alone   Type of Home: House Home Access: Stairs to enter Entrance Stairs-Rails: None Entrance Stairs-Number of Steps: 3 Home Layout: One level Home Equipment: None Additional Comments: Significant other lives in other half of duplex    Prior Function Level of Independence: Independent         Comments: Drives. Retired Clinical biochemist. Walks 30-40 min every morning and reports UE exercise with 10lb weights     Hand Dominance        Extremity/Trunk Assessment   Upper Extremity Assessment Upper Extremity Assessment: Overall WFL for tasks assessed    Lower Extremity Assessment Lower Extremity Assessment: Overall WFL for tasks assessed       Communication   Communication: No difficulties  Cognition Arousal/Alertness: Awake/alert Behavior During Therapy: WFL for tasks assessed/performed Overall Cognitive Status: Within Functional Limits for tasks assessed  General Comments: Good insight that RUE IV might be infiltrated      General Comments      Exercises     Assessment/Plan    PT Assessment Patent does not need any further PT services  PT Problem List         PT Treatment Interventions      PT Goals (Current goals can be found in the Care Plan section)  Acute Rehab PT Goals PT  Goal Formulation: All assessment and education complete, DC therapy    Frequency     Barriers to discharge        Co-evaluation               AM-PAC PT "6 Clicks" Mobility  Outcome Measure Help needed turning from your back to your side while in a flat bed without using bedrails?: None Help needed moving from lying on your back to sitting on the side of a flat bed without using bedrails?: None Help needed moving to and from a bed to a chair (including a wheelchair)?: None Help needed standing up from a chair using your arms (e.g., wheelchair or bedside chair)?: None Help needed to walk in hospital room?: None Help needed climbing 3-5 steps with a railing? : None 6 Click Score: 24    End of Session   Activity Tolerance: Patient tolerated treatment well Patient left: in chair;with call bell/phone within reach Nurse Communication: Mobility status PT Visit Diagnosis: Other abnormalities of gait and mobility (R26.89)    Time: 5465-6812 PT Time Calculation (min) (ACUTE ONLY): 19 min   Charges:   PT Evaluation $PT Eval Low Complexity: [place], PT, DPT Acute Rehabilitation Services  Pager [phone] Office [place] 08/10/2019, 10:01 AM

## 2019-08-10 NOTE — Progress Notes (Signed)
MD Pahwani aware of patient's HR in 32'X today and systolic BP in 61'Y. Morning and evening coreg held.

## 2019-08-10 NOTE — Progress Notes (Signed)
PROGRESS NOTE    [name]  GYJ:[id number] DOB: 03/19/1945 DOA: 08/08/2019 PCP: Velna Hatchet, MD   Brief Narrative:  [name] is a 74 y.o. male with medical history significant of CAD s/p stents; HTN; and HLD presenting with CP/SOB/rigors.  He reports that he developed severe lower abdominal pain that radiated to his chest.  The pain started a few days ago and was mild but last night the pain escalated and was severe. Last night he was nauseated but no vomiting.  He has been constipated, thinks. He had low-grade fever 100.3 in the ER but didn't know it.  He had rigors in the waiting room.  Based on his symptoms, he underwent CT angiogram of the chest in the ED and was ruled out a PE.  No other chest pathology was found.  CT abdomen and pelvis with contrast was done which showed colitis and somewhat diverticulitis.  He was diagnosed with sepsis secondary to diverticulitis and was started antibiotics.  Admitted under [facility].  Assessment & Plan:   Principal Problem:   Sepsis ([place]) Active Problems:   Hyperlipidemia with target LDL less than 100   Hypertension   CKD (chronic kidney disease), stage III   Diverticulitis   Sepsis due to diverticulitis: Patient meets sepsis criteria based on tachycardia and tachypnea.  Feels much better.  Abdominal pain at right lower quadrant is only 5 out of 10 whereas it was 8 out of 10.  No more nausea.  Has remained afebrile.  He is on clears and tolerating well.  Will advance to full liquid for lunch and then soft for dinner if he tolerates.  He is on cefepime and Flagyl.  Will narrow to ciprofloxacin and continue Flagyl.  Lactic acidosis resolved.  Essential hypertension/asymptomatic bradycardia: Blood pressure slightly on the lower side and mild bradycardia intermittently.  Wondering if he has undiagnosed sleep apnea.  We will continue Coreg and watch carefully as he remains asymptomatic.  HLD -Intolerant to statins -Continue Lovaza  Stage  3a CKD -Appears to be stable at this time -Recheck BMP in AM  CAD -No complaints of angina at this time -Continue Plavix  Chronic CHF -Very abnormal echo with EF about 35% in 08/2017 -He is scheduled for repeat echo on 11/29/19 -Continue Entresto  DVT prophylaxis: enoxaparin (LOVENOX) injection 40 mg Start: 08/09/19 1000   Code Status: Full Code  Family Communication: None present at bedside.  Plan of care discussed with patient in length and he verbalized understanding and agreed with it.  Status is: Inpatient  Remains inpatient appropriate because:Inpatient level of care appropriate due to severity of illness   Dispo: The patient is from: Home              Anticipated d/c is to: Home              Anticipated d/c date is: 1 day              Patient currently is not medically stable to d/c.        Estimated body mass index is 29.13 kg/m as calculated from the following:   Height as of this encounter: 5\' 7"  (1.702 m).   Weight as of this encounter: 84.4 kg.      Nutritional status:               Consultants:   None  Procedures:   None  Antimicrobials:  Anti-infectives (From admission, onward)   Start     Dose/Rate Route  Frequency Ordered Stop   08/09/19 1100  metroNIDAZOLE (FLAGYL) IVPB 500 mg     Discontinue     500 mg 100 mL/hr over 60 Minutes Intravenous Every 8 hours 08/09/19 0933     08/09/19 1000  ceFEPIme (MAXIPIME) 2 g in sodium chloride 0.9 % 100 mL IVPB     Discontinue     2 g 200 mL/hr over 30 Minutes Intravenous Every 12 hours 08/09/19 0959     08/09/19 0215  aztreonam (AZACTAM) 2 g in sodium chloride 0.9 % 100 mL IVPB        2 g 200 mL/hr over 30 Minutes Intravenous  Once 08/09/19 0202 08/09/19 0313   08/09/19 0215  metroNIDAZOLE (FLAGYL) IVPB 500 mg        500 mg 100 mL/hr over 60 Minutes Intravenous  Once 08/09/19 0202 08/09/19 0428   08/09/19 0215  vancomycin (VANCOCIN) IVPB 1000 mg/200 mL premix  Status:  Discontinued         1,000 mg 200 mL/hr over 60 Minutes Intravenous  Once 08/09/19 0202 08/09/19 0206   08/09/19 0215  vancomycin (VANCOREADY) IVPB 2000 mg/400 mL        2,000 mg 200 mL/hr over 120 Minutes Intravenous  Once 08/09/19 0206 08/09/19 0710         Subjective: Seen and examined.  Feels much better.  Right lower quadrant abdominal pain is 5 out of 10 compared to 8 out of 10 yesterday.  No other complaint.  No nausea.  Objective: Vitals:   08/09/19 1742 08/09/19 2328 08/10/19 0531 08/10/19 0858  BP: 110/64 (!) 96/46 (!) 99/52 (!) 102/59  Pulse: (!) 52 (!) 51 81 (!) 49  Resp: 18 18 18    Temp: 97.8 F (36.6 C) 99.4 F (37.4 C) 98.5 F (36.9 C)   TempSrc: Oral Oral Oral   SpO2: 100% 100% 98%   Weight:      Height:        Intake/Output Summary (Last 24 hours) at 08/10/2019 1157 Last data filed at 08/10/2019 0500 Gross per 24 hour  Intake 15140 ml  Output 900 ml  Net 14240 ml   Filed Weights   08/09/19 0200 08/09/19 0210  Weight: 90 kg 84.4 kg    Examination:  General exam: Appears calm and comfortable  Respiratory system: Clear to auscultation. Respiratory effort normal. Cardiovascular system: S1 & S2 heard, RRR. No JVD, murmurs, rubs, gallops or clicks. No pedal edema. Gastrointestinal system: Abdomen is nondistended, soft and right lower quadrant tenderness. No organomegaly or masses felt. Normal bowel sounds heard. Central nervous system: Alert and oriented. No focal neurological deficits. Extremities: Symmetric 5 x 5 power. Skin: No rashes, lesions or ulcers Psychiatry: Judgement and insight appear normal. Mood & affect appropriate.    Data Reviewed: I have personally reviewed following labs and imaging studies  CBC: Recent Labs  Lab 08/08/19 2209 08/10/19 0735  WBC 6.0 4.8  HGB 15.3 12.9*  HCT 45.0 39.0  MCV 94.9 96.1  PLT 237 505*   Basic Metabolic Panel: Recent Labs  Lab 08/08/19 2209 08/10/19 0735  NA 138 137  K 4.1 3.5  CL 103 108  CO2 25 22  GLUCOSE  112* 128*  BUN 18 11  CREATININE 1.30* 1.17  CALCIUM 9.8 8.2*   GFR: Estimated Creatinine Clearance: 57.5 mL/min (by C-G formula based on SCr of 1.17 mg/dL). Liver Function Tests: Recent Labs  Lab 08/09/19 0200  AST 178*  ALT 148*  ALKPHOS 73  BILITOT 1.7*  PROT 7.5  ALBUMIN 4.2   Recent Labs  Lab 08/09/19 0200  LIPASE 22   No results for input(s): AMMONIA in the last 168 hours. Coagulation Profile: Recent Labs  Lab 08/09/19 0200  INR 1.2   Cardiac Enzymes: No results for input(s): CKTOTAL, CKMB, CKMBINDEX, TROPONINI in the last 168 hours. BNP (last 3 results) No results for input(s): PROBNP in the last 8760 hours. HbA1C: No results for input(s): HGBA1C in the last 72 hours. CBG: Recent Labs  Lab 08/09/19 0130  GLUCAP 145*   Lipid Profile: No results for input(s): CHOL, HDL, LDLCALC, TRIG, CHOLHDL, LDLDIRECT in the last 72 hours. Thyroid Function Tests: No results for input(s): TSH, T4TOTAL, FREET4, T3FREE, THYROIDAB in the last 72 hours. Anemia Panel: No results for input(s): VITAMINB12, FOLATE, FERRITIN, TIBC, IRON, RETICCTPCT in the last 72 hours. Sepsis Labs: Recent Labs  Lab 08/09/19 0916 08/09/19 1636 08/09/19 1909 08/09/19 2203 08/10/19 0735  PROCALCITON 4.27  --   --   --  2.85  LATICACIDVEN 2.6* 2.4* 2.1* 1.5  --     Recent Results (from the past 240 hour(s))  SARS Coronavirus 2 by RT PCR (hospital order, performed in [facility] hospital lab) Nasopharyngeal Nasopharyngeal Swab     Status: None   Collection Time: 08/09/19  2:15 AM   Specimen: Nasopharyngeal Swab  Result Value Ref Range Status   SARS Coronavirus 2 NEGATIVE NEGATIVE Final    Comment: (NOTE) SARS-CoV-2 target nucleic acids are NOT DETECTED.  The SARS-CoV-2 RNA is generally detectable in upper and lower respiratory specimens during the acute phase of infection. The lowest concentration of SARS-CoV-2 viral copies this assay can detect is 250 copies / mL. A negative result  does not preclude SARS-CoV-2 infection and should not be used as the sole basis for treatment or other patient management decisions.  A negative result may occur with improper specimen collection / handling, submission of specimen other than nasopharyngeal swab, presence of viral mutation(s) within the areas targeted by this assay, and inadequate number of viral copies (<250 copies / mL). A negative result must be combined with clinical observations, patient history, and epidemiological information.  Fact Sheet for Patients:   StrictlyIdeas.no  Fact Sheet for Healthcare Providers: BankingDealers.co.za  This test is not yet approved or  cleared by the [place] FDA and has been authorized for detection and/or diagnosis of SARS-CoV-2 by FDA under an Emergency Use Authorization (EUA).  This EUA will remain in effect (meaning this test can be used) for the duration of the COVID-19 declaration under Section 564(b)(1) of the Act, 21 U.S.C. section 360bbb-3(b)(1), unless the authorization is terminated or revoked sooner.  Performed at [facility], [place] [address], [place], [place] [address]   Blood Culture (routine x 2)     Status: None (Preliminary result)   Collection Time: 08/09/19  2:29 AM   Specimen: BLOOD  Result Value Ref Range Status   Specimen Description BLOOD RIGHT UPPER ARM  Final   Special Requests   Final    BOTTLES DRAWN AEROBIC AND ANAEROBIC Blood Culture results may not be optimal due to an inadequate volume of blood received in culture bottles   Culture   Final    NO GROWTH < 12 HOURS Performed at [facility], [place] [address], [place], [place] [address]    Report Status PENDING  Incomplete  Blood Culture (routine x 2)     Status: None (Preliminary result)   Collection Time: 08/09/19  3:00 AM   Specimen: BLOOD  Result Value Ref Range Status   Specimen Description BLOOD LEFT ANTECUBITAL  Final   Special  Requests   Final    BOTTLES DRAWN AEROBIC AND ANAEROBIC Blood Culture results may not be optimal due to an excessive volume of blood received in culture bottles   Culture   Final    NO GROWTH < 12 HOURS Performed at [facility] [address] [place], [place] [address]    Report Status PENDING  Incomplete  Urine culture     Status: None   Collection Time: 08/09/19  4:30 AM   Specimen: In/Out Cath Urine  Result Value Ref Range Status   Specimen Description IN/OUT CATH URINE  Final   Special Requests NONE  Final   Culture   Final    NO GROWTH Performed at [facility], [place] [address], [place], [place] [address]    Report Status 08/09/2019 FINAL  Final      Radiology Studies: DG Chest 2 View  Result Date: 08/08/2019 CLINICAL DATA:  Right side chest pain EXAM: CHEST - 2 VIEW COMPARISON:  10/19/2017 FINDINGS: The heart size and mediastinal contours are within normal limits. Both lungs are clear. The visualized skeletal structures are unremarkable. IMPRESSION: No active cardiopulmonary disease. Electronically Signed   By: Rolm Baptise M.D.   On: 08/08/2019 22:36   CT ANGIO CHEST PE W OR WO CONTRAST  Result Date: 08/09/2019 CLINICAL DATA:  Right-sided chest pain and upper abdomen pain EXAM: CT ANGIOGRAPHY CHEST WITH CONTRAST TECHNIQUE: Multidetector CT imaging of the chest was performed using the standard protocol during bolus administration of intravenous contrast. Multiplanar CT image reconstructions and MIPs were obtained to evaluate the vascular anatomy. CONTRAST:  145mL OMNIPAQUE IOHEXOL 350 MG/ML SOLN COMPARISON:  None. FINDINGS: Cardiovascular: There is a optimal opacification of the pulmonary arteries. There is no central,segmental, or subsegmental filling defects within the pulmonary arteries. The heart is normal in size. No pericardial effusion or thickening. No evidence right heart strain. There is normal three-vessel brachiocephalic anatomy without proximal stenosis.  Scattered aortic atherosclerosis is noted. Coronary artery calcifications are present. Mediastinum/Nodes: No hilar, mediastinal, or axillary adenopathy. Thyroid gland, trachea, and esophagus demonstrate no significant findings. Lungs/Pleura: The lungs are clear. No pleural effusion or pneumothorax. No airspace consolidation. Upper Abdomen: No acute abnormalities present in the visualized portions of the upper abdomen. Musculoskeletal: No chest wall abnormality. No acute or significant osseous findings. Anterior flowing osteophytes in the lower thoracic spine. Review of the MIP images confirms the above findings. Abdomen/pelvis: Hepatobiliary: There is diffuse low density seen throughout the liver parenchyma. No focal hepatic lesion is seen. No intra or extrahepatic biliary ductal dilatation. The main portal vein is patent. No evidence of calcified gallstones, gallbladder wall thickening or biliary dilatation. Pancreas: Unremarkable. No pancreatic ductal dilatation or surrounding inflammatory changes. Spleen: Normal in size without focal abnormality. Adrenals/Urinary Tract: Both adrenal glands appear normal. The kidneys and collecting system appear normal without evidence of urinary tract calculus or hydronephrosis. Bladder is unremarkable. Stomach/Bowel: The stomach and small bowel normal in appearance. There is extensive colonic diverticulosis. There is question of mild wall thickening versus under distension seen within the redundant sigmoid colon, series 5, image 54. No significant surrounding fat stranding changes however are noted. Vascular/Lymphatic: There are no enlarged mesenteric, retroperitoneal, or pelvic lymph nodes. Scattered aortic atherosclerotic calcifications are seen without aneurysmal dilatation. Reproductive: A heterogeneously enlarged prostate gland is noted which protrudes again seen  posterior bladder. Other: No evidence of abdominal wall mass or hernia. Musculoskeletal: No acute or significant  osseous findings. IMPRESSION: No central, segmental, or subsegmental pulmonary embolism Apparent mild wall thickening seen within the redundant sigmoid colon with extensive diverticulosis. This could be due to underdistention versus true wall thickening/mild colitis. Hepatic steatosis Aortic Atherosclerosis (ICD10-I70.0). Electronically Signed   By: Prudencio Pair M.D.   On: 08/09/2019 04:33   CT ABDOMEN PELVIS W CONTRAST  Result Date: 08/09/2019 CLINICAL DATA:  Right-sided chest pain and upper abdomen pain EXAM: CT ANGIOGRAPHY CHEST WITH CONTRAST TECHNIQUE: Multidetector CT imaging of the chest was performed using the standard protocol during bolus administration of intravenous contrast. Multiplanar CT image reconstructions and MIPs were obtained to evaluate the vascular anatomy. CONTRAST:  136mL OMNIPAQUE IOHEXOL 350 MG/ML SOLN COMPARISON:  None. FINDINGS: Cardiovascular: There is a optimal opacification of the pulmonary arteries. There is no central,segmental, or subsegmental filling defects within the pulmonary arteries. The heart is normal in size. No pericardial effusion or thickening. No evidence right heart strain. There is normal three-vessel brachiocephalic anatomy without proximal stenosis. Scattered aortic atherosclerosis is noted. Coronary artery calcifications are present. Mediastinum/Nodes: No hilar, mediastinal, or axillary adenopathy. Thyroid gland, trachea, and esophagus demonstrate no significant findings. Lungs/Pleura: The lungs are clear. No pleural effusion or pneumothorax. No airspace consolidation. Upper Abdomen: No acute abnormalities present in the visualized portions of the upper abdomen. Musculoskeletal: No chest wall abnormality. No acute or significant osseous findings. Anterior flowing osteophytes in the lower thoracic spine. Review of the MIP images confirms the above findings. Abdomen/pelvis: Hepatobiliary: There is diffuse low density seen throughout the liver parenchyma. No focal  hepatic lesion is seen. No intra or extrahepatic biliary ductal dilatation. The main portal vein is patent. No evidence of calcified gallstones, gallbladder wall thickening or biliary dilatation. Pancreas: Unremarkable. No pancreatic ductal dilatation or surrounding inflammatory changes. Spleen: Normal in size without focal abnormality. Adrenals/Urinary Tract: Both adrenal glands appear normal. The kidneys and collecting system appear normal without evidence of urinary tract calculus or hydronephrosis. Bladder is unremarkable. Stomach/Bowel: The stomach and small bowel normal in appearance. There is extensive colonic diverticulosis. There is question of mild wall thickening versus under distension seen within the redundant sigmoid colon, series 5, image 54. No significant surrounding fat stranding changes however are noted. Vascular/Lymphatic: There are no enlarged mesenteric, retroperitoneal, or pelvic lymph nodes. Scattered aortic atherosclerotic calcifications are seen without aneurysmal dilatation. Reproductive: A heterogeneously enlarged prostate gland is noted which protrudes again seen posterior bladder. Other: No evidence of abdominal wall mass or hernia. Musculoskeletal: No acute or significant osseous findings. IMPRESSION: No central, segmental, or subsegmental pulmonary embolism Apparent mild wall thickening seen within the redundant sigmoid colon with extensive diverticulosis. This could be due to underdistention versus true wall thickening/mild colitis. Hepatic steatosis Aortic Atherosclerosis (ICD10-I70.0). Electronically Signed   By: Prudencio Pair M.D.   On: 08/09/2019 04:33    Scheduled Meds: . carvedilol  3.125 mg Oral BID WC  . clopidogrel  75 mg Oral Daily  . dicyclomine  20 mg Oral BID PC  . enoxaparin (LOVENOX) injection  40 mg Subcutaneous Q24H  . omega-3 acid ethyl esters  1 g Oral Daily  . pantoprazole  40 mg Oral Daily  . sacubitril-valsartan  1 tablet Oral BID  . sodium chloride  flush  3 mL Intravenous Q12H   Continuous Infusions: . ceFEPime (MAXIPIME) IV 200 mL/hr at 08/10/19 1103  . lactated ringers 100 mL/hr at 08/10/19 0628  .  metronidazole 500 mg (08/10/19 1152)     LOS: 1 day   Time spent: 33 minutes   Darliss Cheney, MD [facility]  08/10/2019, 11:57 AM   To contact the attending provider between 7A-7P or the covering provider during after hours 7P-7A, please log into the web site [email or web address].

## 2019-08-11 LAB — CBC WITH DIFFERENTIAL/PLATELET
Abs Immature Granulocytes: 0.01 10*3/uL (ref 0.00–0.07)
Basophils Absolute: 0 10*3/uL (ref 0.0–0.1)
Basophils Relative: 0 %
Eosinophils Absolute: 0.1 10*3/uL (ref 0.0–0.5)
Eosinophils Relative: 3 %
HCT: 36.2 % — ABNORMAL LOW (ref 39.0–52.0)
Hemoglobin: 12.2 g/dL — ABNORMAL LOW (ref 13.0–17.0)
Immature Granulocytes: 0 %
Lymphocytes Relative: 20 %
Lymphs Abs: 0.7 10*3/uL (ref 0.7–4.0)
MCH: 32.4 pg (ref 26.0–34.0)
MCHC: 33.7 g/dL (ref 30.0–36.0)
MCV: 96 fL (ref 80.0–100.0)
Monocytes Absolute: 0.4 10*3/uL (ref 0.1–1.0)
Monocytes Relative: 11 %
Neutro Abs: 2.4 10*3/uL (ref 1.7–7.7)
Neutrophils Relative %: 66 %
Platelets: 134 10*3/uL — ABNORMAL LOW (ref 150–400)
RBC: 3.77 MIL/uL — ABNORMAL LOW (ref 4.22–5.81)
RDW: 13.7 % (ref 11.5–15.5)
WBC: 3.7 10*3/uL — ABNORMAL LOW (ref 4.0–10.5)
nRBC: 0 % (ref 0.0–0.2)

## 2019-08-11 LAB — BASIC METABOLIC PANEL
Anion gap: 7 (ref 5–15)
BUN: 10 mg/dL (ref 8–23)
CO2: 26 mmol/L (ref 22–32)
Calcium: 8.5 mg/dL — ABNORMAL LOW (ref 8.9–10.3)
Chloride: 106 mmol/L (ref 98–111)
Creatinine, Ser: 1.3 mg/dL — ABNORMAL HIGH (ref 0.61–1.24)
GFR calc Af Amer: >60 mL/min (ref >60)
GFR calc non Af Amer: 54 mL/min — ABNORMAL LOW (ref >60)
Glucose, Bld: 123 mg/dL — ABNORMAL HIGH (ref 70–99)
Potassium: 3.9 mmol/L (ref 3.5–5.1)
Sodium: 139 mmol/L (ref 135–145)

## 2019-08-11 LAB — PROCALCITONIN: Procalcitonin: 1.77 ng/mL

## 2019-08-11 MED ORDER — METRONIDAZOLE 500 MG PO TABS
500.0000 mg | ORAL_TABLET | Freq: Three times a day (TID) | ORAL | 0 refills | Status: AC
Start: 2019-08-11 — End: 2019-08-19

## 2019-08-11 MED ORDER — CIPROFLOXACIN HCL 500 MG PO TABS
500.0000 mg | ORAL_TABLET | Freq: Two times a day (BID) | ORAL | 0 refills | Status: AC
Start: 2019-08-11 — End: 2019-08-19

## 2019-08-11 NOTE — Discharge Summary (Signed)
Physician Discharge Summary  Jacobe Study ZOX:[id number] DOB: 06-Sep-1945 DOA: 08/08/2019  PCP: Velna Hatchet, MD  Admit date: 08/08/2019 Discharge date: 08/11/2019  Admitted From: Home Disposition: Home  Recommendations for Outpatient Follow-up:  1. Follow up with PCP in 1-2 weeks 2. Please obtain BMP/CBC in one week 3. Please follow up with your PCP on the following pending results: Unresulted Labs (From admission, onward) Comment         None       Home Health: None Equipment/Devices: None  Discharge Condition: Stable CODE STATUS: Full code Diet recommendation: Regular  Subjective: Seen and examined.  Feels much better.  No complaint.  Tolerating soft diet.  Brief/Interim summary: [name] a 74 y.o.malewith medical history significant ofCAD s/p stents; HTN; and HLD presenting with CP/SOB/rigors.He reports that he developed severe lower abdominal pain that radiated to his chest. The pain started a few days ago and was mild but last night the pain escalated and was severe.Last night he was nauseated but no vomiting. He has been constipated, thinks.He had low-grade fever 100.3 in the ER but didn't know it. He had rigors in the waiting room.  Based on his symptoms, he underwent CT angiogram of the chest in the ED and was ruled out a PE.  No other chest pathology was found.  CT abdomen and pelvis with contrast was done which showed colitis and somewhat diverticulitis.  He was diagnosed with sepsis secondary to diverticulitis and was started antibiotics.  Admitted under hospitalist service.  IV antibiotics were continued.  He felt better within 24 hours, he was started on clear liquid diet which was advanced to soft diet which he has tolerated well.  He has no further abdominal pain or tenderness.  He had mild thrombocytopenia which has remained stable and there are no signs of bleeding.  Thrombocytopenia could be due to infection.  He remained hemodynamically stable.   CKD remained stable.  He is being discharged in stable condition on 8 more days of oral ciprofloxacin and Flagyl.  Discharge Diagnoses:  Principal Problem:   Sepsis ([place]) Active Problems:   Hyperlipidemia with target LDL less than 100   Hypertension   CKD (chronic kidney disease), stage III   Diverticulitis    Discharge Instructions   Allergies as of 08/11/2019      Reactions   Asa [aspirin] Other (See Comments)   Abdominal bleeding   Penicillins Anaphylaxis   Has patient had a PCN reaction causing immediate rash, facial/tongue/throat swelling, SOB or lightheadedness with hypotension: Yes Has patient had a PCN reaction causing severe rash involving mucus membranes or skin necrosis: No Has patient had a PCN reaction that required hospitalization: No Has patient had a PCN reaction occurring within the last 10 years: No If all of the above answers are "NO", then may proceed with Cephalosporin use.   Statins Other (See Comments)   Stiff joints, muscle tightness, couldn't walk      Medication List    TAKE these medications   carvedilol 3.125 MG tablet Commonly known as: COREG Take 1 tablet (3.125 mg total) by mouth 2 (two) times daily with a meal.   ciprofloxacin 500 MG tablet Commonly known as: Cipro Take 1 tablet (500 mg total) by mouth 2 (two) times daily for 8 days.   clopidogrel 75 MG tablet Commonly known as: PLAVIX Take 1 tablet (75 mg total) by mouth daily.   dicyclomine 20 MG tablet Commonly known as: BENTYL Take 20 mg by mouth 2 (two) times  daily after a meal.   Entresto 97-103 MG Generic drug: sacubitril-valsartan TAKE 1 TABLET BY MOUTH TWICE A DAY   metroNIDAZOLE 500 MG tablet Commonly known as: Flagyl Take 1 tablet (500 mg total) by mouth 3 (three) times daily for 8 days.   nitroGLYCERIN 0.4 MG SL tablet Commonly known as: NITROSTAT Place 0.4 mg under the tongue every 5 (five) minutes as needed for chest pain. For a max of 3 doses. If no relief, call  911.   omega-3 acid ethyl esters 1 g capsule Commonly known as: LOVAZA Take 1 g by mouth daily.   pantoprazole 40 MG tablet Commonly known as: PROTONIX Take 1 tablet (40 mg total) by mouth daily.   zolpidem 10 MG tablet Commonly known as: AMBIEN Take 1 tablet (10 mg total) by mouth at bedtime as needed. For sleep       Follow-up Information    Velna Hatchet, MD Follow up in 1 week(s).   Specialty: Internal Medicine Contact information: [place] [address] [phone]        Josue Hector, MD .   Specialty: Cardiology Contact information: [phone] [place]  [address] [phone]              Allergies  Allergen Reactions  . Asa [Aspirin] Other (See Comments)    Abdominal bleeding  . Penicillins Anaphylaxis    Has patient had a PCN reaction causing immediate rash, facial/tongue/throat swelling, SOB or lightheadedness with hypotension: Yes Has patient had a PCN reaction causing severe rash involving mucus membranes or skin necrosis: No Has patient had a PCN reaction that required hospitalization: No Has patient had a PCN reaction occurring within the last 10 years: No If all of the above answers are "NO", then may proceed with Cephalosporin use.  . Statins Other (See Comments)    Stiff joints, muscle tightness, couldn't walk    Consultations: None   Procedures/Studies: DG Chest 2 View  Result Date: 08/08/2019 CLINICAL DATA:  Right side chest pain EXAM: CHEST - 2 VIEW COMPARISON:  10/19/2017 FINDINGS: The heart size and mediastinal contours are within normal limits. Both lungs are clear. The visualized skeletal structures are unremarkable. IMPRESSION: No active cardiopulmonary disease. Electronically Signed   By: Rolm Baptise M.D.   On: 08/08/2019 22:36   CT ANGIO CHEST PE W OR WO CONTRAST  Result Date: 08/09/2019 CLINICAL DATA:  Right-sided chest pain and upper abdomen pain EXAM: CT ANGIOGRAPHY CHEST WITH  CONTRAST TECHNIQUE: Multidetector CT imaging of the chest was performed using the standard protocol during bolus administration of intravenous contrast. Multiplanar CT image reconstructions and MIPs were obtained to evaluate the vascular anatomy. CONTRAST:  167mL OMNIPAQUE IOHEXOL 350 MG/ML SOLN COMPARISON:  None. FINDINGS: Cardiovascular: There is a optimal opacification of the pulmonary arteries. There is no central,segmental, or subsegmental filling defects within the pulmonary arteries. The heart is normal in size. No pericardial effusion or thickening. No evidence right heart strain. There is normal three-vessel brachiocephalic anatomy without proximal stenosis. Scattered aortic atherosclerosis is noted. Coronary artery calcifications are present. Mediastinum/Nodes: No hilar, mediastinal, or axillary adenopathy. Thyroid gland, trachea, and esophagus demonstrate no significant findings. Lungs/Pleura: The lungs are clear. No pleural effusion or pneumothorax. No airspace consolidation. Upper Abdomen: No acute abnormalities present in the visualized portions of the upper abdomen. Musculoskeletal: No chest wall abnormality. No acute or significant osseous findings. Anterior flowing osteophytes in the lower thoracic spine. Review of the MIP images confirms the above  findings. Abdomen/pelvis: Hepatobiliary: There is diffuse low density seen throughout the liver parenchyma. No focal hepatic lesion is seen. No intra or extrahepatic biliary ductal dilatation. The main portal vein is patent. No evidence of calcified gallstones, gallbladder wall thickening or biliary dilatation. Pancreas: Unremarkable. No pancreatic ductal dilatation or surrounding inflammatory changes. Spleen: Normal in size without focal abnormality. Adrenals/Urinary Tract: Both adrenal glands appear normal. The kidneys and collecting system appear normal without evidence of urinary tract calculus or hydronephrosis. Bladder is unremarkable.  Stomach/Bowel: The stomach and small bowel normal in appearance. There is extensive colonic diverticulosis. There is question of mild wall thickening versus under distension seen within the redundant sigmoid colon, series 5, image 54. No significant surrounding fat stranding changes however are noted. Vascular/Lymphatic: There are no enlarged mesenteric, retroperitoneal, or pelvic lymph nodes. Scattered aortic atherosclerotic calcifications are seen without aneurysmal dilatation. Reproductive: A heterogeneously enlarged prostate gland is noted which protrudes again seen posterior bladder. Other: No evidence of abdominal wall mass or hernia. Musculoskeletal: No acute or significant osseous findings. IMPRESSION: No central, segmental, or subsegmental pulmonary embolism Apparent mild wall thickening seen within the redundant sigmoid colon with extensive diverticulosis. This could be due to underdistention versus true wall thickening/mild colitis. Hepatic steatosis Aortic Atherosclerosis (ICD10-I70.0). Electronically Signed   By: Prudencio Pair M.D.   On: 08/09/2019 04:33   CT ABDOMEN PELVIS W CONTRAST  Result Date: 08/09/2019 CLINICAL DATA:  Right-sided chest pain and upper abdomen pain EXAM: CT ANGIOGRAPHY CHEST WITH CONTRAST TECHNIQUE: Multidetector CT imaging of the chest was performed using the standard protocol during bolus administration of intravenous contrast. Multiplanar CT image reconstructions and MIPs were obtained to evaluate the vascular anatomy. CONTRAST:  131mL OMNIPAQUE IOHEXOL 350 MG/ML SOLN COMPARISON:  None. FINDINGS: Cardiovascular: There is a optimal opacification of the pulmonary arteries. There is no central,segmental, or subsegmental filling defects within the pulmonary arteries. The heart is normal in size. No pericardial effusion or thickening. No evidence right heart strain. There is normal three-vessel brachiocephalic anatomy without proximal stenosis. Scattered aortic atherosclerosis is  noted. Coronary artery calcifications are present. Mediastinum/Nodes: No hilar, mediastinal, or axillary adenopathy. Thyroid gland, trachea, and esophagus demonstrate no significant findings. Lungs/Pleura: The lungs are clear. No pleural effusion or pneumothorax. No airspace consolidation. Upper Abdomen: No acute abnormalities present in the visualized portions of the upper abdomen. Musculoskeletal: No chest wall abnormality. No acute or significant osseous findings. Anterior flowing osteophytes in the lower thoracic spine. Review of the MIP images confirms the above findings. Abdomen/pelvis: Hepatobiliary: There is diffuse low density seen throughout the liver parenchyma. No focal hepatic lesion is seen. No intra or extrahepatic biliary ductal dilatation. The main portal vein is patent. No evidence of calcified gallstones, gallbladder wall thickening or biliary dilatation. Pancreas: Unremarkable. No pancreatic ductal dilatation or surrounding inflammatory changes. Spleen: Normal in size without focal abnormality. Adrenals/Urinary Tract: Both adrenal glands appear normal. The kidneys and collecting system appear normal without evidence of urinary tract calculus or hydronephrosis. Bladder is unremarkable. Stomach/Bowel: The stomach and small bowel normal in appearance. There is extensive colonic diverticulosis. There is question of mild wall thickening versus under distension seen within the redundant sigmoid colon, series 5, image 54. No significant surrounding fat stranding changes however are noted. Vascular/Lymphatic: There are no enlarged mesenteric, retroperitoneal, or pelvic lymph nodes. Scattered aortic atherosclerotic calcifications are seen without aneurysmal dilatation. Reproductive: A heterogeneously enlarged prostate gland is noted which protrudes again seen posterior bladder. Other: No evidence of abdominal wall mass or hernia.  Musculoskeletal: No acute or significant osseous findings. IMPRESSION: No  central, segmental, or subsegmental pulmonary embolism Apparent mild wall thickening seen within the redundant sigmoid colon with extensive diverticulosis. This could be due to underdistention versus true wall thickening/mild colitis. Hepatic steatosis Aortic Atherosclerosis (ICD10-I70.0). Electronically Signed   By: Prudencio Pair M.D.   On: 08/09/2019 04:33   US Abdomen Limited RUQ  Result Date: 07/15/2019 CLINICAL DATA:  LFT elevation. EXAM: ULTRASOUND ABDOMEN LIMITED RIGHT UPPER QUADRANT COMPARISON:  CT abdomen/pelvis 10/22/2013 FINDINGS: Gallbladder: No gallstones or wall thickening visualized. No sonographic Murphy sign noted by sonographer. Common bile duct: Diameter: 4 mm, within normal limits. Liver: Diffusely increased hepatic parenchymal echogenicity. No focal liver lesion is identified. Portal vein is patent on color Doppler imaging with normal direction of blood flow towards the liver. IMPRESSION: Hyperechogenicity of the hepatic parenchyma. This is a nonspecific finding, which may be seen in the setting of hepatic steatosis or other chronic hepatic parenchymal disease. Otherwise unremarkable right upper quadrant ultrasound as described. Electronically Signed   By: Kellie Simmering DO   On: 07/15/2019 11:56      Discharge Exam: Vitals:   08/11/19 0413 08/11/19 0828  BP: (!) 101/56 (!) 109/51  Pulse: (!) 58 (!) 50  Resp: 17   Temp: 97.8 F (36.6 C)   SpO2: 97%    Vitals:   08/10/19 1957 08/11/19 0014 08/11/19 0413 08/11/19 0828  BP: (!) 118/51 (!) 112/50 (!) 101/56 (!) 109/51  Pulse: 76 (!) 54 (!) 58 (!) 50  Resp: 17 17 17    Temp: 99 F (37.2 C) 98.5 F (36.9 C) 97.8 F (36.6 C)   TempSrc: Oral Oral Oral   SpO2: 100% 98% 97%   Weight:      Height:        General: Pt is alert, awake, not in acute distress Cardiovascular: RRR, S1/S2 +, no rubs, no gallops Respiratory: CTA bilaterally, no wheezing, no rhonchi Abdominal: Soft, NT, ND, bowel sounds + Extremities: no edema, no  cyanosis    The results of significant diagnostics from this hospitalization (including imaging, microbiology, ancillary and laboratory) are listed below for reference.     Microbiology: Recent Results (from the past 240 hour(s))  SARS Coronavirus 2 by RT PCR (hospital order, performed in [facility] hospital lab) Nasopharyngeal Nasopharyngeal Swab     Status: None   Collection Time: 08/09/19  2:15 AM   Specimen: Nasopharyngeal Swab  Result Value Ref Range Status   SARS Coronavirus 2 NEGATIVE NEGATIVE Final    Comment: (NOTE) SARS-CoV-2 target nucleic acids are NOT DETECTED.  The SARS-CoV-2 RNA is generally detectable in upper and lower respiratory specimens during the acute phase of infection. The lowest concentration of SARS-CoV-2 viral copies this assay can detect is 250 copies / mL. A negative result does not preclude SARS-CoV-2 infection and should not be used as the sole basis for treatment or other patient management decisions.  A negative result may occur with improper specimen collection / handling, submission of specimen other than nasopharyngeal swab, presence of viral mutation(s) within the areas targeted by this assay, and inadequate number of viral copies (<250 copies / mL). A negative result must be combined with clinical observations, patient history, and epidemiological information.  Fact Sheet for Patients:   StrictlyIdeas.no  Fact Sheet for Healthcare Providers: BankingDealers.co.za  This test is not yet approved or  cleared by the [place] FDA and has been authorized for detection and/or diagnosis of SARS-CoV-2 by FDA under an  Emergency Use Authorization (EUA).  This EUA will remain in effect (meaning this test can be used) for the duration of the COVID-19 declaration under Section 564(b)(1) of the Act, 21 U.S.C. section 360bbb-3(b)(1), unless the authorization is terminated or revoked sooner.  Performed  at [facility], [place] [address], [place], [place] [address]   Blood Culture (routine x 2)     Status: None (Preliminary result)   Collection Time: 08/09/19  2:29 AM   Specimen: BLOOD  Result Value Ref Range Status   Specimen Description BLOOD RIGHT UPPER ARM  Final   Special Requests   Final    BOTTLES DRAWN AEROBIC AND ANAEROBIC Blood Culture results may not be optimal due to an inadequate volume of blood received in culture bottles   Culture   Final    NO GROWTH 1 DAY Performed at [facility], [place] [address], [place], [place] [address]    Report Status PENDING  Incomplete  Blood Culture (routine x 2)     Status: None (Preliminary result)   Collection Time: 08/09/19  3:00 AM   Specimen: BLOOD  Result Value Ref Range Status   Specimen Description BLOOD LEFT ANTECUBITAL  Final   Special Requests   Final    BOTTLES DRAWN AEROBIC AND ANAEROBIC Blood Culture results may not be optimal due to an excessive volume of blood received in culture bottles   Culture   Final    NO GROWTH 1 DAY Performed at [facility], [place] [address], [place], [place] [address]    Report Status PENDING  Incomplete  Urine culture     Status: None   Collection Time: 08/09/19  4:30 AM   Specimen: In/Out Cath Urine  Result Value Ref Range Status   Specimen Description IN/OUT CATH URINE  Final   Special Requests NONE  Final   Culture   Final    NO GROWTH Performed at [facility], [place] [address] [place], [place] [address]    Report Status 08/09/2019 FINAL  Final     Labs: BNP (last 3 results) No results for input(s): BNP in the last 8760 hours. Basic Metabolic Panel: Recent Labs  Lab 08/08/19 2209 08/10/19 0735 08/11/19 0445  NA 138 137 139  K 4.1 3.5 3.9  CL 103 108 106  CO2 25 22 26   GLUCOSE 112* 128* 123*  BUN 18 11 10   CREATININE 1.30* 1.17 1.30*  CALCIUM 9.8 8.2* 8.5*   Liver Function Tests: Recent Labs  Lab 08/09/19 0200  AST 178*  ALT 148*  ALKPHOS 73   BILITOT 1.7*  PROT 7.5  ALBUMIN 4.2   Recent Labs  Lab 08/09/19 0200  LIPASE 22   No results for input(s): AMMONIA in the last 168 hours. CBC: Recent Labs  Lab 08/08/19 2209 08/10/19 0735 08/11/19 0445  WBC 6.0 4.8 3.7*  NEUTROABS  --   --  2.4  HGB 15.3 12.9* 12.2*  HCT 45.0 39.0 36.2*  MCV 94.9 96.1 96.0  PLT 237 134* 134*   Cardiac Enzymes: No results for input(s): CKTOTAL, CKMB, CKMBINDEX, TROPONINI in the last 168 hours. BNP: Invalid input(s): POCBNP CBG: Recent Labs  Lab 08/09/19 0130  GLUCAP 145*   D-Dimer No results for input(s): DDIMER in the last 72 hours. Hgb A1c No results for input(s): HGBA1C in the last 72 hours. Lipid Profile No results for input(s): CHOL, HDL, LDLCALC, TRIG, CHOLHDL, LDLDIRECT in the last 72 hours. Thyroid function studies No  results for input(s): TSH, T4TOTAL, T3FREE, THYROIDAB in the last 72 hours.  Invalid input(s): FREET3 Anemia work up No results for input(s): VITAMINB12, FOLATE, FERRITIN, TIBC, IRON, RETICCTPCT in the last 72 hours. Urinalysis    Component Value Date/Time   COLORURINE YELLOW 08/09/2019 0446   APPEARANCEUR CLEAR 08/09/2019 0446   LABSPEC 1.045 (H) 08/09/2019 0446   PHURINE 5.0 08/09/2019 0446   GLUCOSEU NEGATIVE 08/09/2019 0446   HGBUR MODERATE (A) 08/09/2019 0446   BILIRUBINUR NEGATIVE 08/09/2019 0446   KETONESUR NEGATIVE 08/09/2019 0446   PROTEINUR NEGATIVE 08/09/2019 0446   UROBILINOGEN 1.0 10/22/2013 1225   NITRITE NEGATIVE 08/09/2019 0446   LEUKOCYTESUR NEGATIVE 08/09/2019 0446   Sepsis Labs Invalid input(s): PROCALCITONIN,  WBC,  LACTICIDVEN Microbiology Recent Results (from the past 240 hour(s))  SARS Coronavirus 2 by RT PCR (hospital order, performed in [place] hospital lab) Nasopharyngeal Nasopharyngeal Swab     Status: None   Collection Time: 08/09/19  2:15 AM   Specimen: Nasopharyngeal Swab  Result Value Ref Range Status   SARS Coronavirus 2 NEGATIVE NEGATIVE Final    Comment:  (NOTE) SARS-CoV-2 target nucleic acids are NOT DETECTED.  The SARS-CoV-2 RNA is generally detectable in upper and lower respiratory specimens during the acute phase of infection. The lowest concentration of SARS-CoV-2 viral copies this assay can detect is 250 copies / mL. A negative result does not preclude SARS-CoV-2 infection and should not be used as the sole basis for treatment or other patient management decisions.  A negative result may occur with improper specimen collection / handling, submission of specimen other than nasopharyngeal swab, presence of viral mutation(s) within the areas targeted by this assay, and inadequate number of viral copies (<250 copies / mL). A negative result must be combined with clinical observations, patient history, and epidemiological information.  Fact Sheet for Patients:   StrictlyIdeas.no  Fact Sheet for Healthcare Providers: BankingDealers.co.za  This test is not yet approved or  cleared by the [place] FDA and has been authorized for detection and/or diagnosis of SARS-CoV-2 by FDA under an Emergency Use Authorization (EUA).  This EUA will remain in effect (meaning this test can be used) for the duration of the COVID-19 declaration under Section 564(b)(1) of the Act, 21 U.S.C. section 360bbb-3(b)(1), unless the authorization is terminated or revoked sooner.  Performed at [facility], [place] [address], [place], [place] [address]   Blood Culture (routine x 2)     Status: None (Preliminary result)   Collection Time: 08/09/19  2:29 AM   Specimen: BLOOD  Result Value Ref Range Status   Specimen Description BLOOD RIGHT UPPER ARM  Final   Special Requests   Final    BOTTLES DRAWN AEROBIC AND ANAEROBIC Blood Culture results may not be optimal due to an inadequate volume of blood received in culture bottles   Culture   Final    NO GROWTH 1 DAY Performed at [facility], [place]  [address], [place], [place] [address]    Report Status PENDING  Incomplete  Blood Culture (routine x 2)     Status: None (Preliminary result)   Collection Time: 08/09/19  3:00 AM   Specimen: BLOOD  Result Value Ref Range Status   Specimen Description BLOOD LEFT ANTECUBITAL  Final   Special Requests   Final    BOTTLES DRAWN AEROBIC AND ANAEROBIC Blood Culture results may not be optimal due to an excessive volume of blood received in culture bottles   Culture  Final    NO GROWTH 1 DAY Performed at [facility], [place] [address], [place], [place] [address]    Report Status PENDING  Incomplete  Urine culture     Status: None   Collection Time: 08/09/19  4:30 AM   Specimen: In/Out Cath Urine  Result Value Ref Range Status   Specimen Description IN/OUT CATH URINE  Final   Special Requests NONE  Final   Culture   Final    NO GROWTH Performed at [facility], [place] [address] [place], [place] [address]    Report Status 08/09/2019 FINAL  Final     Time coordinating discharge: Over 30 minutes  SIGNED:   Darliss Cheney, MD  [facility] 08/11/2019, 9:04 AM  If 7PM-7AM, please contact night-coverage [email or web address]

## 2019-08-11 NOTE — Discharge Instructions (Signed)
Colitis ° °Colitis is inflammation of the colon. Colitis may last a short time (be acute), or it may last a long time (become chronic). °What are the causes? °This condition may be caused by: °· Viruses. °· Bacteria. °· Reaction to medicine. °· Certain autoimmune diseases such as Crohn's disease or ulcerative colitis. °· Radiation treatment. °· Decreased blood flow to the bowel (ischemia). °What are the signs or symptoms? °Symptoms of this condition include: °· Watery diarrhea. °· Passing bloody or tarry stool. °· Pain. °· Fever. °· Vomiting. °· Tiredness (fatigue). °· Weight loss. °· Bloating. °· Abdominal pain. °· Having fewer bowel movements than usual. °· A strong and sudden urge to have a bowel movement. °· Feeling like the bowel is not empty after a bowel movement. °How is this diagnosed? °This condition is diagnosed with a stool test or a blood test. °You may also have other tests, such as: °· X-rays. °· CT scan. °· Colonoscopy. °· Endoscopy. °· Biopsy. °How is this treated? °Treatment for this condition depends on the cause. The condition may be treated by: °· Resting the bowel. This involves not eating or drinking for a period of time. °· Fluids that are given through an IV. °· Medicine for pain and diarrhea. °· Antibiotic medicines. °· Cortisone medicines. °· Surgery. °Follow these instructions at home: °Eating and drinking ° °· Follow instructions from your health care provider about eating or drinking restrictions. °· Drink enough fluid to keep your urine pale yellow. °· Work with a dietitian to determine which foods cause your condition to flare up. °· Avoid foods that cause flare-ups. °· Eat a well-balanced diet. °General instructions °· If you were prescribed an antibiotic medicine, take it as told by your health care provider. Do not stop taking the antibiotic even if you start to feel better. °· Take over-the-counter and prescription medicines only as told by your health care provider. °· Keep all  follow-up visits as told by your health care provider. This is important. °Contact a health care provider if: °· Your symptoms do not go away. °· You develop new symptoms. °Get help right away if you: °· Have a fever that does not go away with treatment. °· Develop chills. °· Have extreme weakness, fainting, or dehydration. °· Have repeated vomiting. °· Develop severe pain in your abdomen. °· Pass bloody or tarry stool. °Summary °· Colitis is inflammation of the colon. Colitis may last a short time (be acute), or it may last a long time (become chronic). °· Treatment for this condition depends on the cause and may include resting the bowel, taking medicines, or having surgery. °· If you were prescribed an antibiotic medicine, take it as told by your health care provider. Do not stop taking the antibiotic even if you start to feel better. °· Get help right away if you develop severe pain in your abdomen. °· Keep all follow-up visits as told by your health care provider. This is important. °This information is not intended to replace advice given to you by your health care provider. Make sure you discuss any questions you have with your health care provider. °Document Revised: 08/13/2017 Document Reviewed: 08/13/2017 °Elsevier Patient Education © 2020 [employer] ° °

## 2019-08-14 LAB — CULTURE, BLOOD (ROUTINE X 2)
Culture: NO GROWTH
Culture: NO GROWTH

## 2019-09-07 ENCOUNTER — Other Ambulatory Visit: Payer: Self-pay | Admitting: Cardiovascular Disease

## 2019-11-29 ENCOUNTER — Ambulatory Visit (HOSPITAL_COMMUNITY): Payer: Medicare Other | Attending: Cardiology

## 2019-11-29 ENCOUNTER — Other Ambulatory Visit: Payer: Self-pay

## 2019-11-29 DIAGNOSIS — I255 Ischemic cardiomyopathy: Secondary | ICD-10-CM | POA: Diagnosis not present

## 2019-11-29 LAB — ECHOCARDIOGRAM COMPLETE
Area-P 1/2: 3.21 cm2
P 1/2 time: 576 msec
S' Lateral: 3.9 cm

## 2019-11-29 MED ORDER — PERFLUTREN LIPID MICROSPHERE
1.0000 mL | INTRAVENOUS | Status: AC | PRN
  Administered 2019-11-29: 1 mL via INTRAVENOUS

## 2019-11-30 NOTE — Progress Notes (Signed)
Cardiology Office Note    Date:  12/13/2019   ID:  [name], DOB 06/22/1945, MRN [id number]  PCP:  Velna Hatchet, MD  Cardiologist: Jenkins Rouge, MD EPS: None  No chief complaint on file.   History of Present Illness:   74 y.o. f/u for advanced CAD and ischemic DCM.  Anterior MI with DES to LAD and circumflex in 2012 [place]. 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 Unstable angina August 2019 cath with Dr Burt Knack. 10/20/17 DES to mid LAD for instent  Restenosis. Other arteries ok except small OM1 not suitable for intervention. TTE with EF 35% F/U MRI 12/25/17 to risk stratify For AICD EF 46% full thickness scar involving distal septum, anterior wall apex and inferior apex. History of GI bleed March 2019 with duodenal erosions that were clipped. Intolerant to ASA. Also history of CKD, HLD, HTN  No angina compliant with meds no recurrent GI bleeding No signs of volume overload or CHF Functional class one   Referred to lipid clinic Did not want PSK 9 injections Wanted to think about Nexletol because it was new    Hospitalized 08/08/19 abdominal pain CT with colitis/diverticulitis Rx with bowel rest and antibiotics with improvement D/c with Cipro    Past Medical History:  Diagnosis Date  . Anxiety   . Arthritis    "wrists" (07/27/2014)  . Coronary artery disease    a.  Pt with 4 stents over the course of 2003-2012;  b.  LHC 6/16:  LAD, RCA, LCx stents patent, OM1 50%, EF 50-55% >> med rx   . Elevated LFTs   . GERD (gastroesophageal reflux disease)   . Hypercholesterolemia   . Hypertension   . Ischemic cardiomyopathy    cMRI 03/2011:  EF 48% with dist ant, septal, apical and inf-apical dyskinesia, no apical clot, dist ant, apical and septal full thickness scar >> No ICD needed  //  Limited echo 7/19: Large apical septal aneurysm, severe hypokinesis of the entire apex and mid to apical segments of the anterior wall, anterolateral wall and anterior septum-consistent with  infarct and most of the LAD territory; EF 35   . Left ventricular aneurysm    Limited echo 7/19: Large apical septal aneurysm, severe hypokinesis of the entire apex and mid to apical segments of the anterior wall, anterolateral wall and anterior septum-consistent with infarct and most of the LAD territory; EF 35  . Myocardial infarction [facility]) 2003    Past Surgical History:  Procedure Laterality Date  . CARDIAC CATHETERIZATION  06/11/2012   Nonobstructive CAD, patent stents, EF 50%, mild apical dyskinesis  . CARDIAC CATHETERIZATION N/A 07/28/2014   Procedure: Left Heart Cath and Coronary Angiography;  Surgeon: Troy Sine, MD;  Location: [place] [facility];  Service: Cardiovascular;  Laterality: N/A;  . CORONARY ANGIOPLASTY WITH STENT PLACEMENT  2003-2012   total of 4 stents place  . CORONARY STENT INTERVENTION N/A 10/20/2017   Procedure: CORONARY STENT INTERVENTION;  Surgeon: Sherren Mocha, MD;  Location: [place] [facility];  Service: Cardiovascular;  Laterality: N/A;  . COSMETIC SURGERY Left 1970's   "dog ripped of my ear"  . LEFT HEART CATH AND CORONARY ANGIOGRAPHY N/A 10/20/2017   Procedure: LEFT HEART CATH AND CORONARY ANGIOGRAPHY;  Surgeon: Sherren Mocha, MD;  Location: [place] [facility];  Service: Cardiovascular;  Laterality: N/A;  . LEFT HEART CATHETERIZATION WITH CORONARY ANGIOGRAM N/A 06/11/2012   Procedure: LEFT HEART CATHETERIZATION WITH CORONARY ANGIOGRAM;  Surgeon: Colinda Barth M Martinique, MD;  Location: [place] [facility];  Service: Cardiovascular;  Laterality: N/A;    Current Medications: Current Meds  Medication Sig  . carvedilol (COREG) 3.125 MG tablet TAKE 1 TABLET BY MOUTH 2 TIMES DAILY WITH A MEAL.  Marland Kitchen clopidogrel (PLAVIX) 75 MG tablet Take 1 tablet (75 mg total) by mouth daily.  Marland Kitchen dicyclomine (BENTYL) 20 MG tablet Take 20 mg by mouth 2 (two) times daily after a meal.   . ENTRESTO 97-103 MG TAKE 1 TABLET BY MOUTH TWICE A DAY (Patient taking differently: Take 1 tablet by mouth 2  (two) times daily. )  . nitroGLYCERIN (NITROSTAT) 0.4 MG SL tablet Place 1 tablet (0.4 mg total) under the tongue every 5 (five) minutes as needed for chest pain. For a max of 3 doses. If no relief, call 911.  . omega-3 acid ethyl esters (LOVAZA) 1 G capsule Take 1 g by mouth daily.  . pantoprazole (PROTONIX) 40 MG tablet Take 1 tablet (40 mg total) by mouth daily.  Marland Kitchen zolpidem (AMBIEN) 10 MG tablet Take 1 tablet (10 mg total) by mouth at bedtime as needed. For sleep  . [DISCONTINUED] nitroGLYCERIN (NITROSTAT) 0.4 MG SL tablet Place 0.4 mg under the tongue every 5 (five) minutes as needed for chest pain. For a max of 3 doses. If no relief, call 911.     Allergies:   Asa [aspirin], Penicillins, and Statins   Social History   Socioeconomic History  . Marital status: Divorced    Spouse name: Not on file  . Number of children: Not on file  . Years of education: Not on file  . Highest education level: Not on file  Occupational History  . Occupation: retired  Tobacco Use  . Smoking status: Former Smoker    Packs/day: 1.00    Years: 15.00    Pack years: 15.00    Types: Cigarettes  . Smokeless tobacco: Never Used  . Tobacco comment: "quit smoking cigarettes in 1979"  Vaping Use  . Vaping Use: Never used  Substance and Sexual Activity  . Alcohol use: Not Currently    Alcohol/week: 2.0 standard drinks    Types: 2 Glasses of wine per week  . Drug use: No  . Sexual activity: Not Currently  Other Topics Concern  . Not on file  Social History Narrative  . Not on file   Social Determinants of Health   Financial Resource Strain:   . Difficulty of Paying Living Expenses: Not on file  Food Insecurity:   . Worried About Charity fundraiser in the Last Year: Not on file  . Ran Out of Food in the Last Year: Not on file  Transportation Needs:   . Lack of Transportation (Medical): Not on file  . Lack of Transportation (Non-Medical): Not on file  Physical Activity:   . Days of Exercise per  Week: Not on file  . Minutes of Exercise per Session: Not on file  Stress:   . Feeling of Stress : Not on file  Social Connections:   . Frequency of Communication with Friends and Family: Not on file  . Frequency of Social Gatherings with Friends and Family: Not on file  . Attends Religious Services: Not on file  . Active Member of Clubs or Organizations: Not on file  . Attends Archivist Meetings: Not on file  . Marital Status: Not on file     Family History:  The patient's family history includes Coronary artery disease in his father and mother.  ROS:   Please see the history of present illness.    Review of Systems  Constitutional: Negative.  HENT: Negative.   Cardiovascular: Negative.   Respiratory: Negative.   Endocrine: Negative.   Hematologic/Lymphatic: Negative.   Musculoskeletal: Negative.   Gastrointestinal: Negative.   Genitourinary: Negative.   Neurological: Negative.    All other systems reviewed and are negative.   PHYSICAL EXAM:   VS:  BP 128/74   Pulse 93   Ht 5\' 7"  (1.702 m)   Wt 189 lb 12.8 oz (86.1 kg)   SpO2 98%   BMI 29.73 kg/m   Physical Exam  GEN: Well nourished, well developed, in no acute distress  Affect appropriate Healthy:  appears stated age 14: normal Neck supple with no adenopathy JVP normal no bruits no thyromegaly Lungs clear with no wheezing and good diaphragmatic motion Heart:  S1/S2 no murmur, no rub, gallop or click PMI enlarged  Abdomen: benighn, BS positve, no tenderness, no AAA no bruit.  No HSM or HJR Distal pulses intact with no bruits No edema Neuro non-focal Skin warm and dry No muscular weakness   Wt Readings from Last 3 Encounters:  12/13/19 189 lb 12.8 oz (86.1 kg)  08/09/19 186 lb (84.4 kg)  06/16/19 198 lb 6.4 oz (90 kg)      Studies/Labs Reviewed:   EKG:  EKG is not ordered today.   Recent Labs: 08/09/2019: ALT 148 08/11/2019: BUN 10; Creatinine, Ser 1.30; Hemoglobin 12.2; Platelets  134; Potassium 3.9; Sodium 139   Lipid Panel    Component Value Date/Time   CHOL 210 (H) 06/16/2019 1102   TRIG 350 (H) 06/16/2019 1102   HDL 38 (L) 06/16/2019 1102   CHOLHDL 5.5 (H) 06/16/2019 1102   CHOLHDL 5.3 06/11/2012 0234   VLDL 73 (H) 06/11/2012 0234   LDLCALC 112 (H) 06/16/2019 1102    Additional studies/ records that were reviewed today include:   Cardiac catheterization 8/27/2019Ost 1st Mrg to 1st Mrg lesion is 75% stenosed.  Non-stenotic Ost RCA to Mid RCA lesion was previously treated.  Mid RCA to Dist RCA lesion is 40% stenosed.  Prox Cx to Mid Cx lesion is 30% stenosed.  Prox LAD to Mid LAD lesion is 80% stenosed.  A drug-eluting stent was successfully placed using a STENT RESOLUTE ONYX 2.5X38.  Post intervention, there is a 0% residual stenosis.   1.  Multivessel coronary artery disease with continued patency of the stented segment in the RCA, continued patency of the stented segment in the left circumflex with mild in-stent restenosis and severe stenosis of a small obtuse marginal branch not suitable for PCI, and severe diffuse stenosis of the proximal and mid LAD treated successfully with overlapping resolute Onyx drug-eluting stents 2.  Known severe LV systolic dysfunction   Recommendation: The patient should continue on clopidogrel alone.  He is intolerant to aspirin.  I would favor long-term clopidogrel since he is intolerant to aspirin and has been treated with multiple stents.   Recommend dual antiplatelet therapy with Clopidogrel 75mg  daily long-term (beyond 12 months) because of multiple stents, severe LV dysfunction, ASA intolerance. Echo 09/01/17 Impressions:   - Limited Definity enhanced study for LV wall motion and thrombus.   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   There is severe hypokinesis in the entire apex and the mid-apical   segments of the anterior wall, anterolateral wall and anterior   septum.   This is consistent with infarction in  most of the LAD artery  territory.   Overall LV ejection fraction is approximately 35%.     Left Heart Cath and Coronary Angiography  07/2014  Conclusion     Ost 1st Mrg to 1st Mrg lesion, 50% stenosed.  Ost LAD to Dist LAD lesion, 20% stenosed.   Low normal LV function with an ejection fraction of 50-55% with evidence for an akinetic/dyskinetic aneurysmal apex.   Coronary artery  disease with patent tandem proximal to mid LAD stents with smooth 20% in-stent narrowing in the distal aspect of the stent; patent  left circumflex stent with evidence for 50-60% proximal narrowing in a small marginal branch which arises with aupper takeoff from this stented segment; and widely patent RCA stent in a dominant RCA system.   RECOMMENDATION:   Medical therapy.       ASSESSMENT:    No diagnosis found.   PLAN:  In order of problems listed above:  CAD status post MIs in 2012 in [place] treated with DES to the LAD circumflex and RCA.  August 2019 with unstable angina treated with overlapping DES to the LAD 10/20/2017.  He is aspirin intolerant so plan for long-term clopidogrel.  No angina.    Ischemic cardiomyopathy ejection fraction 35% on echo 08/2017.  Started on Entresto MRI with EF 46% 12/25/17 continue medical Rx  HTN:  Well controlled.  Continue current medications and low sodium Dash type diet. Tends toward bradycardia so beta blocker not increased    CKD stage III creatinine was 1.21 October 2019 [facility]   Hyperlipidemia LDL was 92 August 2021 Only on [place]    History of GI bleed in [place] 04/2017 secondary to large erosion and duodenal bulb that was clipped.    Medication Adjustments/Labs and Tests Ordered: Current medicines are reviewed at length with the patient today.  Concerns regarding medicines are outlined above.  Medication changes, Labs and Tests ordered today are listed in the Patient Instructions below. Patient Instructions  Medication  Instructions:  *If you need a refill on your cardiac medications before your next appointment, please call your pharmacy*  Lab Work: If you have labs (blood work) drawn today and your tests are completely normal, you will receive your results only by: Marland Kitchen MyChart Message (if you have MyChart) OR . A paper copy in the mail If you have any lab test that is abnormal or we need to change your treatment, we will call you to review the results.  Testing/Procedures: None ordered today.  Follow-Up: At [facility], you and your health needs are our priority.  As part of our continuing mission to provide you with exceptional heart care, we have created designated Provider Care Teams.  These Care Teams include your primary Cardiologist (physician) and Advanced Practice Providers (APPs -  Physician Assistants and Nurse Practitioners) who all work together to provide you with the care you need, when you need it.  We recommend signing up for the patient portal called "MyChart".  Sign up information is provided on this After Visit Summary.  MyChart is used to connect with patients for Virtual Visits (Telemedicine).  Patients are able to view lab/test results, encounter notes, upcoming appointments, etc.  Non-urgent messages can be sent to your provider as well.   To learn more about what you can do with MyChart, go to [email or web address].    Your next appointment:   1 year(s)  The format for your next appointment:   In Person  Provider:   You may see Jenkins Rouge,  MD or one of the following Advanced Practice Providers on your designated Care Team:    Truitt Merle, NP  Cecilie Kicks, NP  Kathyrn Drown, NP        Signed, Jenkins Rouge, MD  12/13/2019 10:32 AM    [facility] [address], [place],   [address] Phone: [phone]; Fax: [phone]

## 2019-12-13 ENCOUNTER — Ambulatory Visit (INDEPENDENT_AMBULATORY_CARE_PROVIDER_SITE_OTHER): Payer: Medicare Other | Admitting: Cardiovascular Disease

## 2019-12-13 ENCOUNTER — Encounter: Payer: Self-pay | Admitting: Cardiovascular Disease

## 2019-12-13 ENCOUNTER — Other Ambulatory Visit: Payer: Self-pay

## 2019-12-13 VITALS — BP 128/74 | HR 93 | Ht 67.0 in | Wt 189.8 lb

## 2019-12-13 DIAGNOSIS — I2510 Atherosclerotic heart disease of native coronary artery without angina pectoris: Secondary | ICD-10-CM | POA: Diagnosis not present

## 2019-12-13 DIAGNOSIS — I255 Ischemic cardiomyopathy: Secondary | ICD-10-CM | POA: Diagnosis not present

## 2019-12-13 MED ORDER — NITROGLYCERIN 0.4 MG SL SUBL: 0.4000 mg | SUBLINGUAL_TABLET | SUBLINGUAL | 3 refills | Status: DC | PRN

## 2019-12-13 NOTE — Patient Instructions (Signed)

## 2020-03-07 ENCOUNTER — Other Ambulatory Visit: Payer: Self-pay | Admitting: Cardiovascular Disease

## 2020-05-16 ENCOUNTER — Other Ambulatory Visit: Payer: Self-pay | Admitting: Cardiovascular Disease

## 2020-09-06 IMAGING — US US ABDOMEN LIMITED
1 series · 14 of 25 positions shown · non-contrast
Comparison: CT abdomen/pelvis 10/22/2013

CLINICAL DATA: LFT elevation.

EXAM:
ULTRASOUND ABDOMEN LIMITED RIGHT UPPER QUADRANT

[Series 1: us abdomen limited · 0.20mm/px · 14 of 51 slices shown]
[im 1/51]
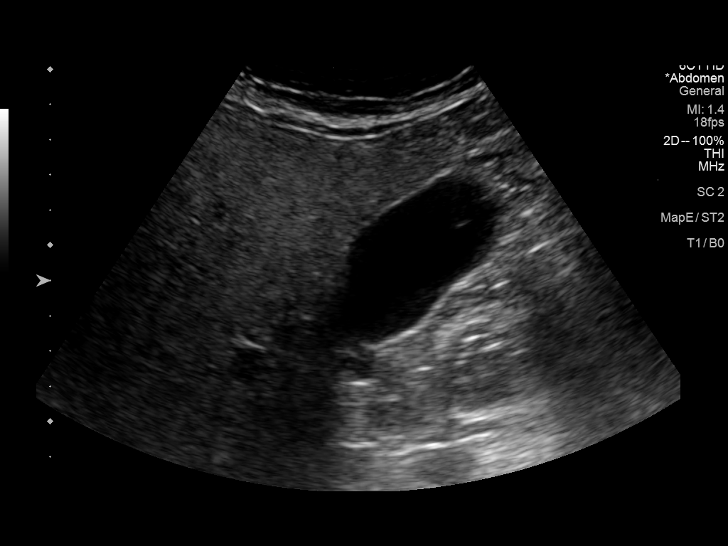
[im 5/51]
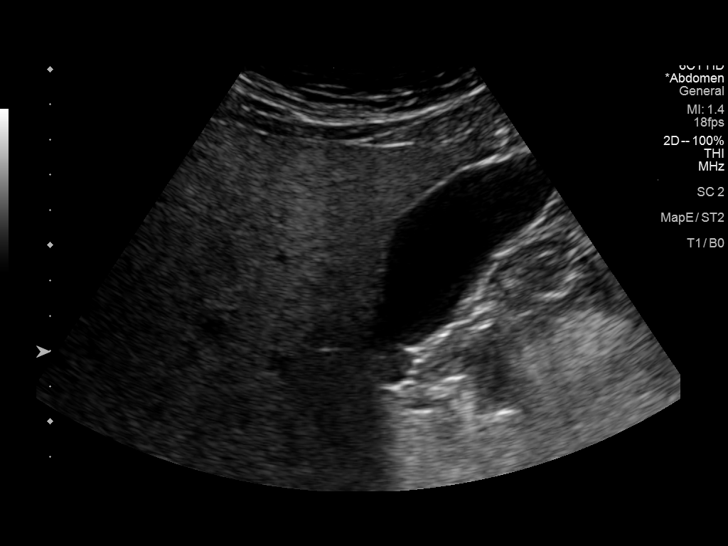
[im 9/51]
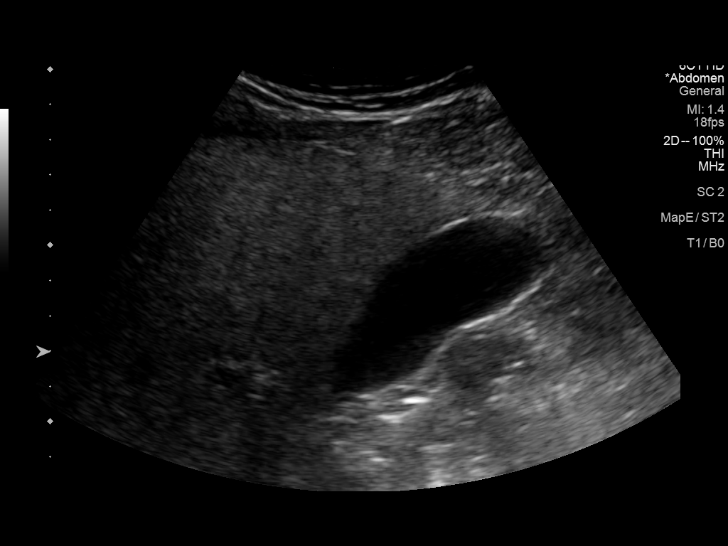
[im 13/51]
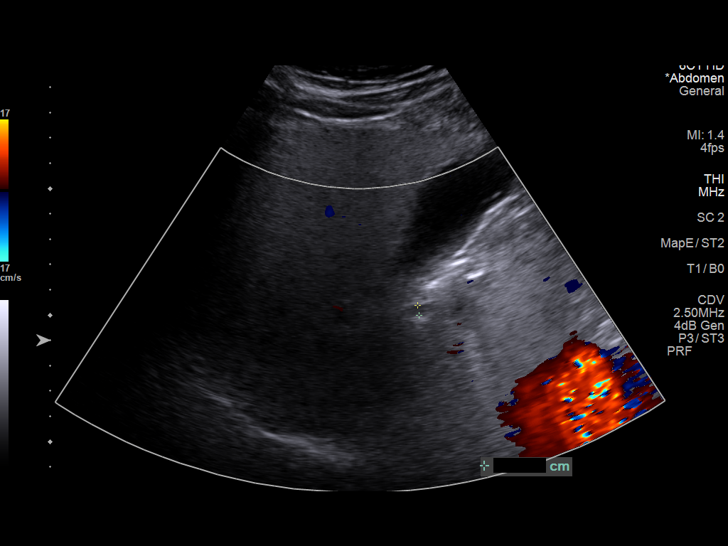
[im 17/51]
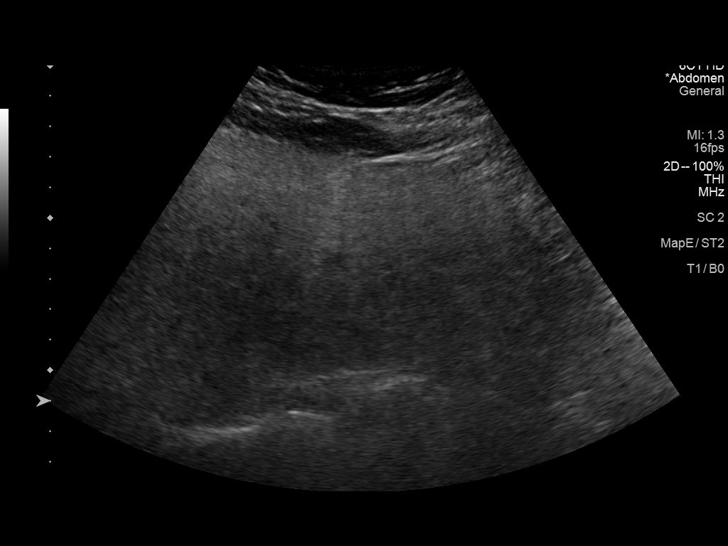
[im 19/51]
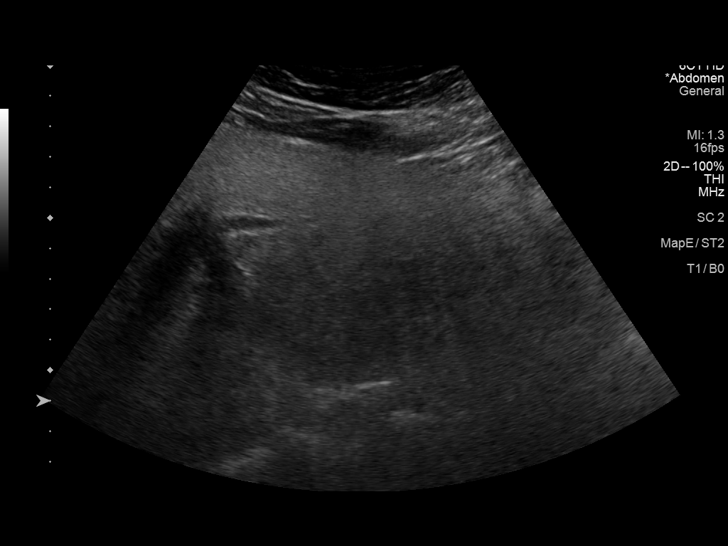
[im 23/51]
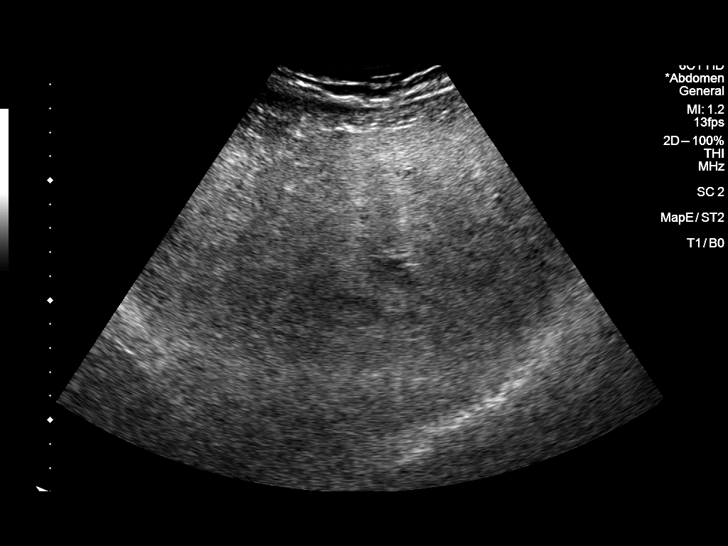
[im 28/51]
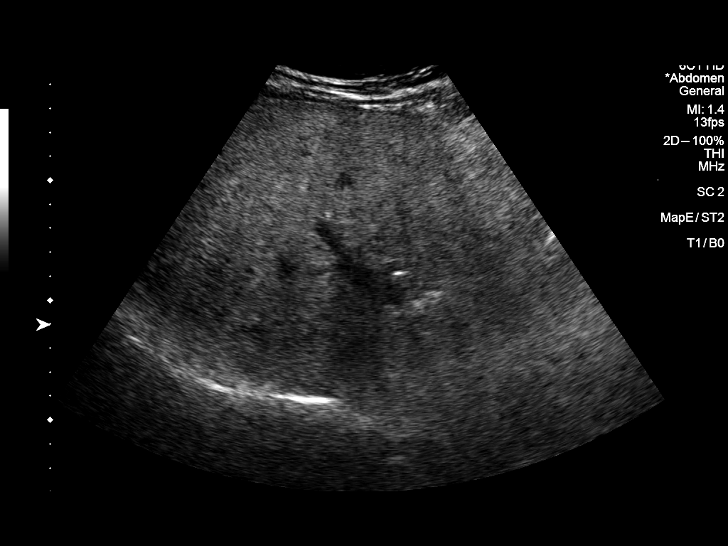
[im 32/51]
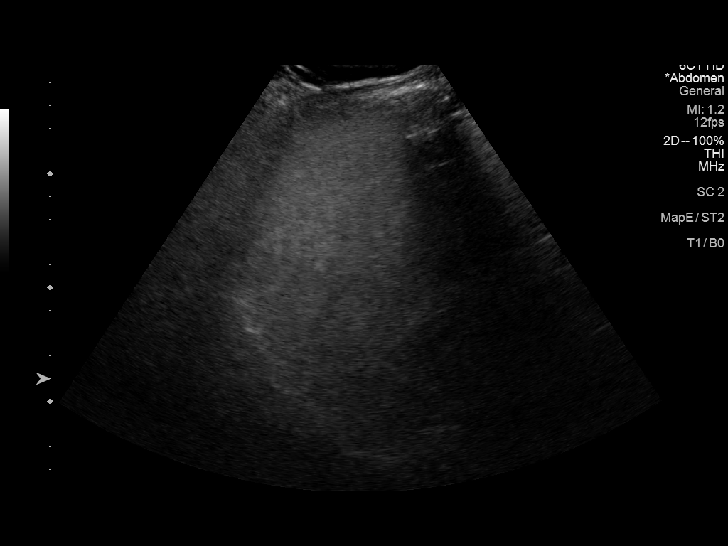
[im 34/51]
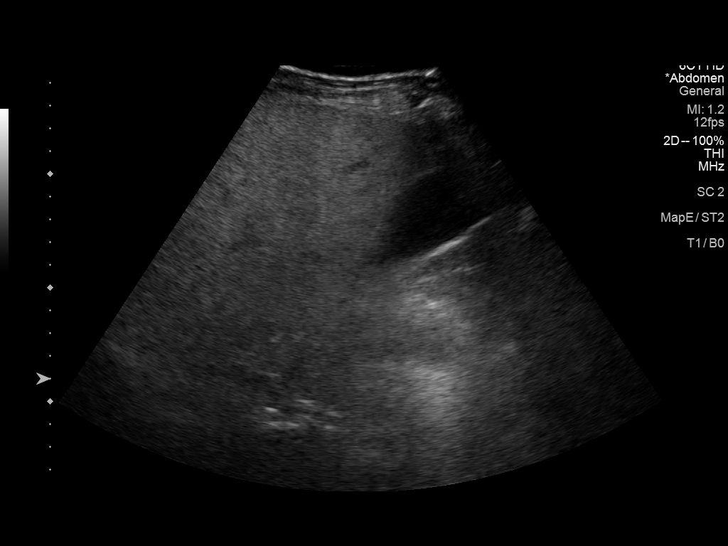
[im 38/51]
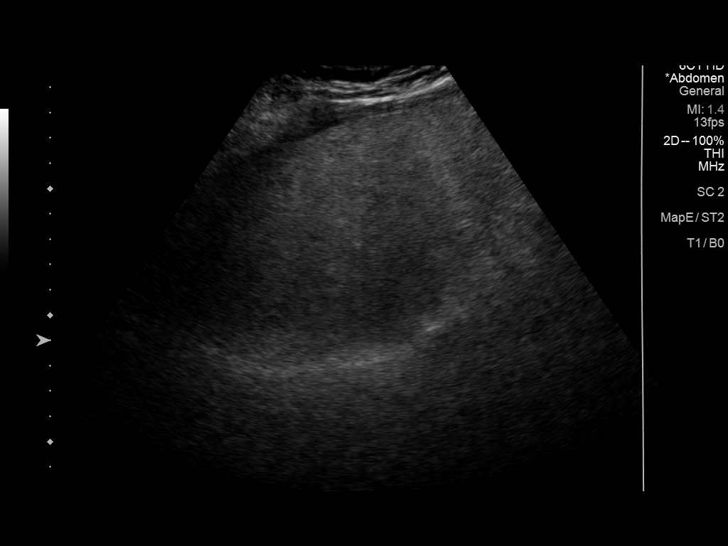
[im 42/51]
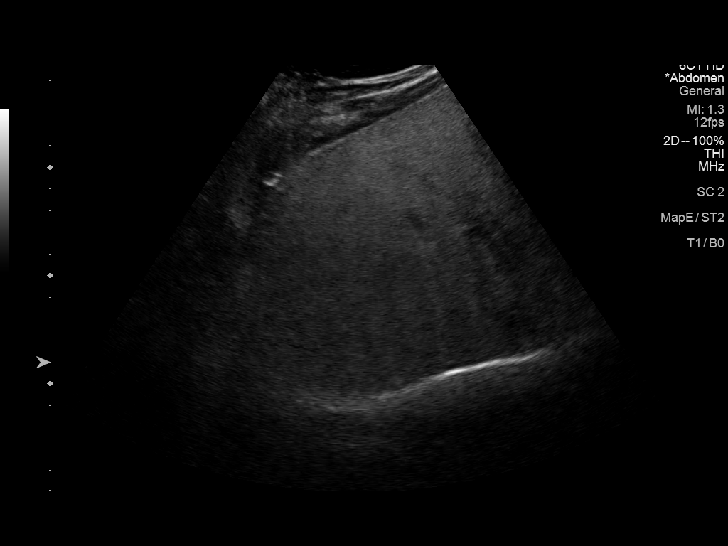
[im 46/51]
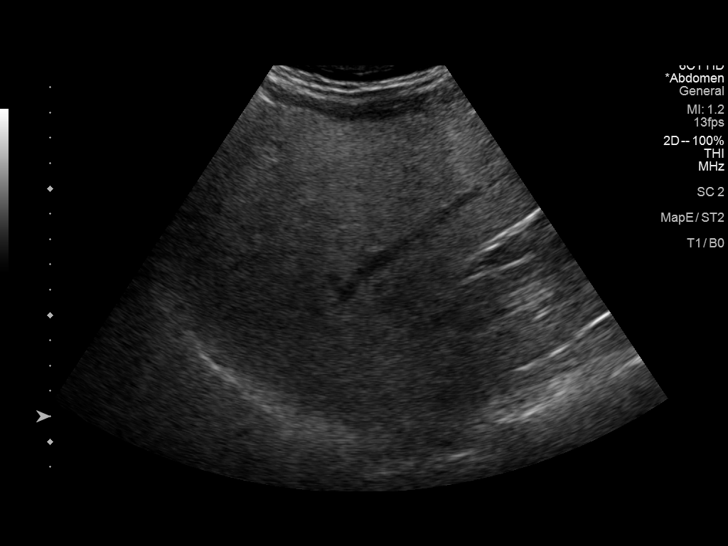
[im 51/51]
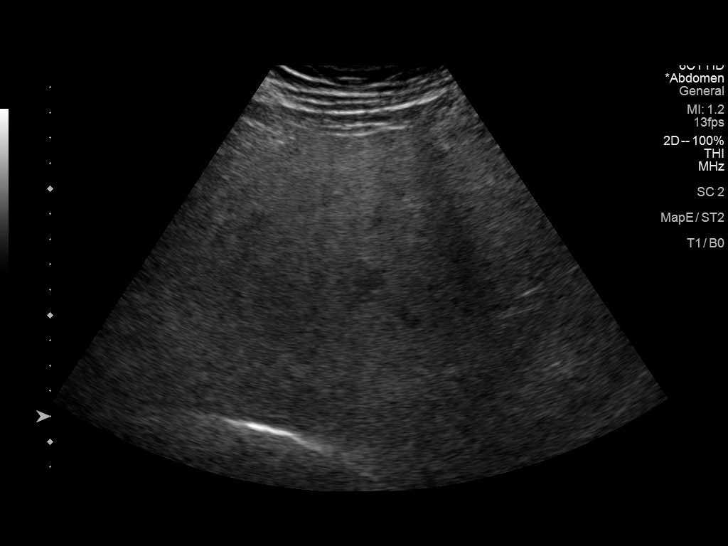

[14 of 25 positions shown; findings below may reference images not displayed]

FINDINGS: Gallbladder:

No gallstones or wall thickening visualized. No sonographic Murphy
sign noted by sonographer.

Common bile duct:

Diameter: 4 mm, within normal limits.

Liver:

Diffusely increased hepatic parenchymal echogenicity. No focal liver
lesion is identified. Portal vein is patent on color Doppler imaging
with normal direction of blood flow towards the liver.
IMPRESSION: Hyperechogenicity of the hepatic parenchyma. This is a nonspecific
finding, which may be seen in the setting of hepatic steatosis or
other chronic hepatic parenchymal disease.

Otherwise unremarkable right upper quadrant ultrasound as described.

## 2020-10-01 IMAGING — CT CT ABD-PELV W/ CM
2 of 5 series · 15 of 46 positions shown, 17 images · IV contrast (APPLIED)
Comparison: None.

CLINICAL DATA: Right-sided chest pain and upper abdomen pain

EXAM:
CT ANGIOGRAPHY CHEST WITH CONTRAST
TECHNIQUE: Multidetector CT imaging of the chest was performed using the
standard protocol during bolus administration of intravenous
contrast. Multiplanar CT image reconstructions and MIPs were
obtained to evaluate the vascular anatomy.
CONTRAST:  100mL OMNIPAQUE IOHEXOL 350 MG/ML SOLN

[Series 5: abdomen 5.0 · axial · 0.84mm/px · z∈[-817,-392]mm · 12 of 101 slices shown, 14 images]
[im 8/101  soft-tissue]
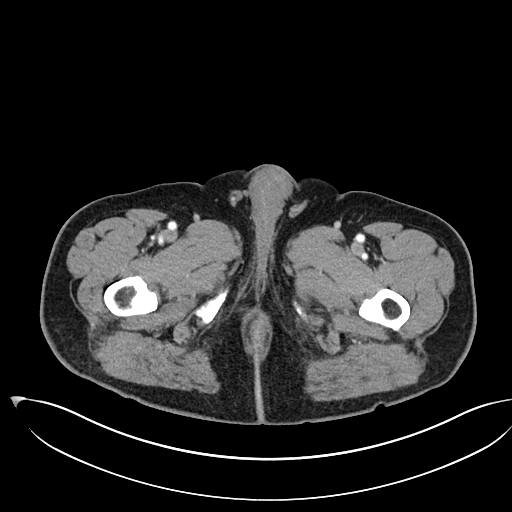
[im 8/101  bone]
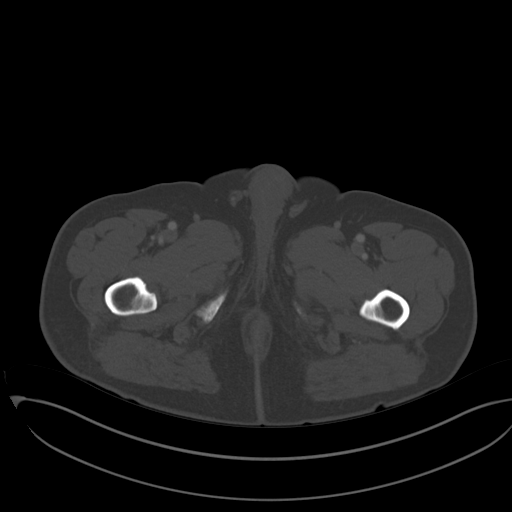
[im 15/101  soft-tissue]
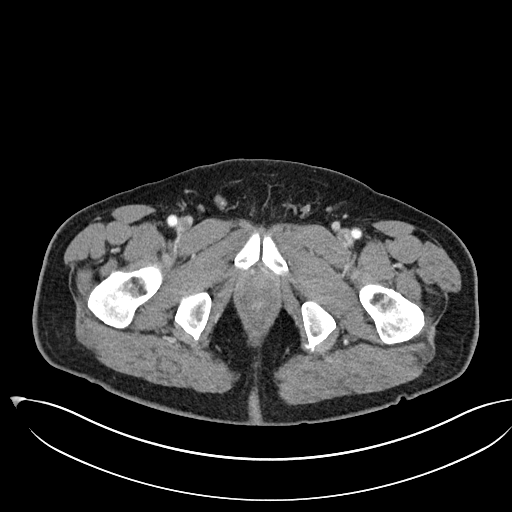
[im 22/101  soft-tissue]
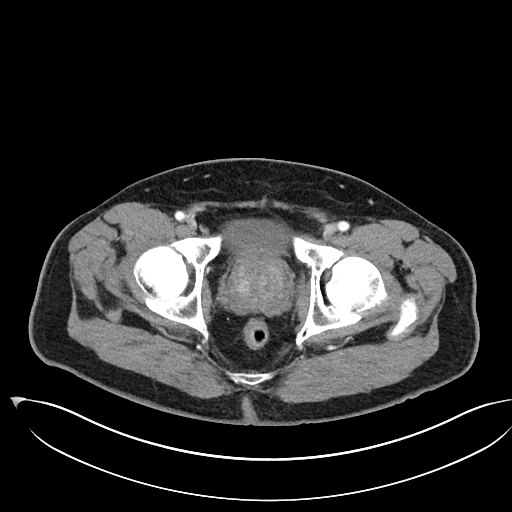
[im 29/101  soft-tissue]
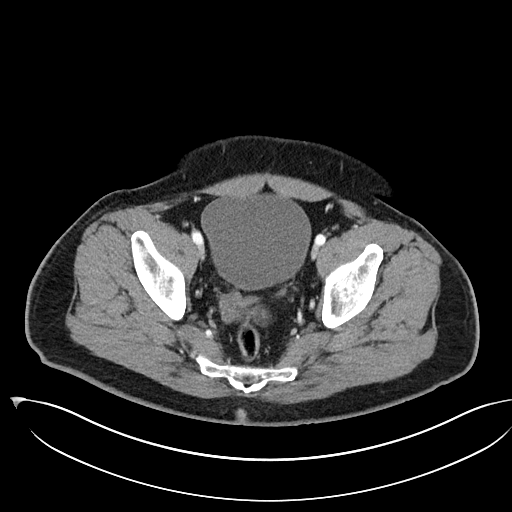
[im 36/101  soft-tissue]
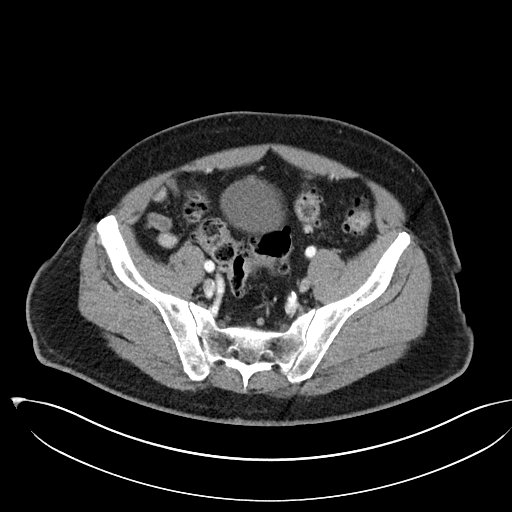
[im 43/101  soft-tissue]
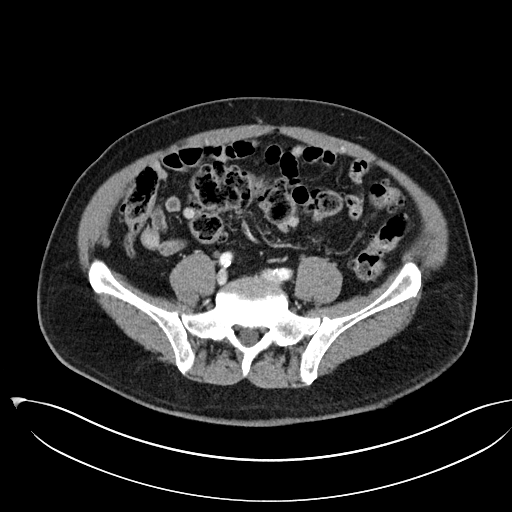
[im 58/101  soft-tissue]
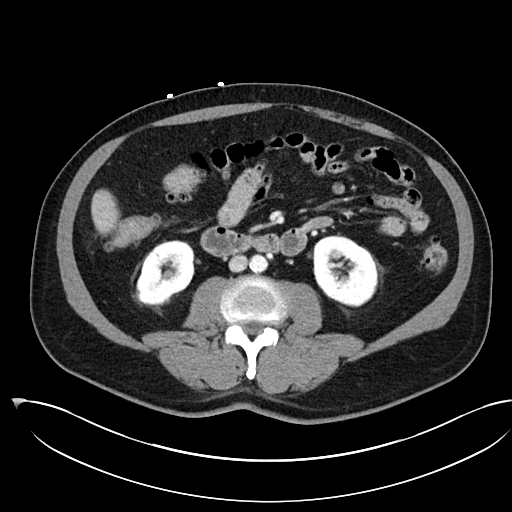
[im 65/101  soft-tissue]
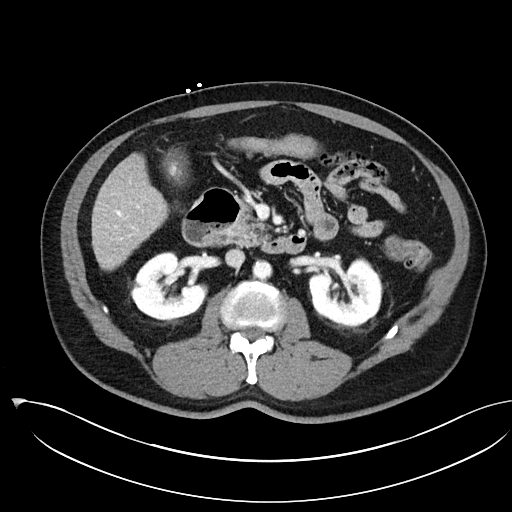
[im 72/101  soft-tissue]
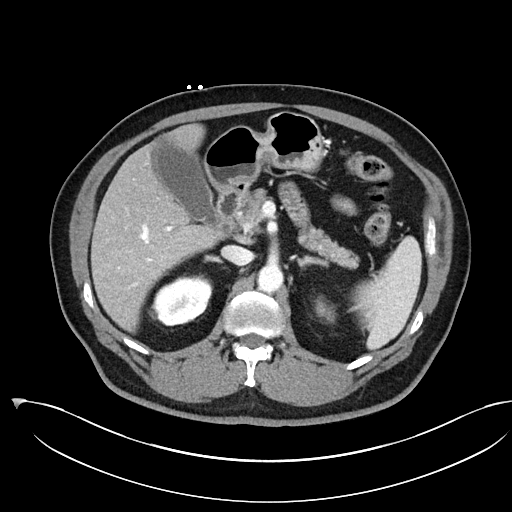
[im 72/101  bone]
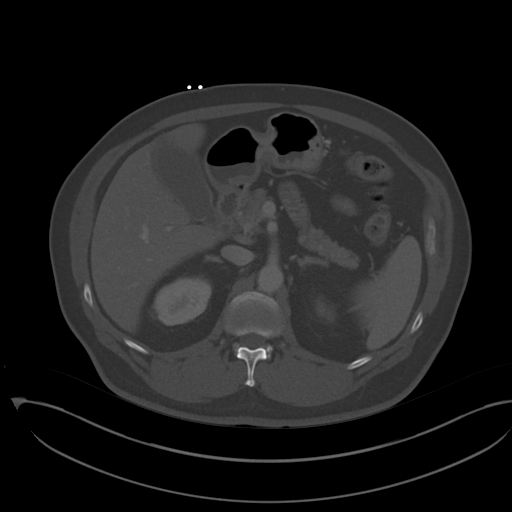
[im 79/101  soft-tissue]
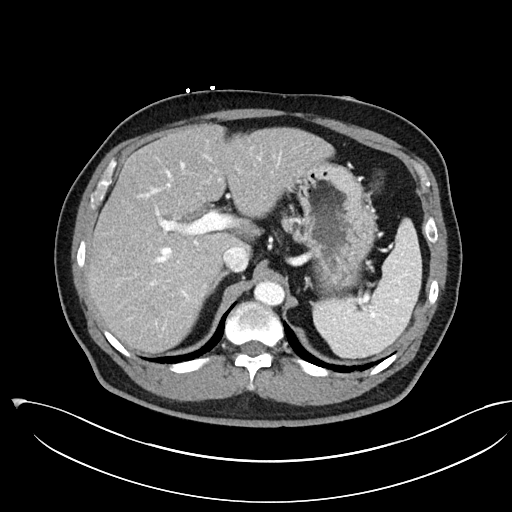
[im 86/101  soft-tissue]
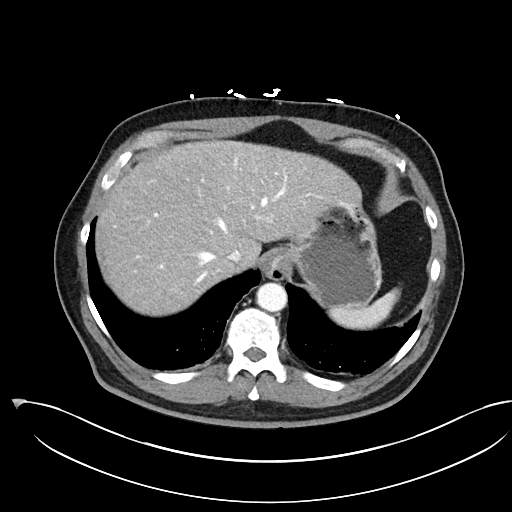
[im 93/101  soft-tissue]
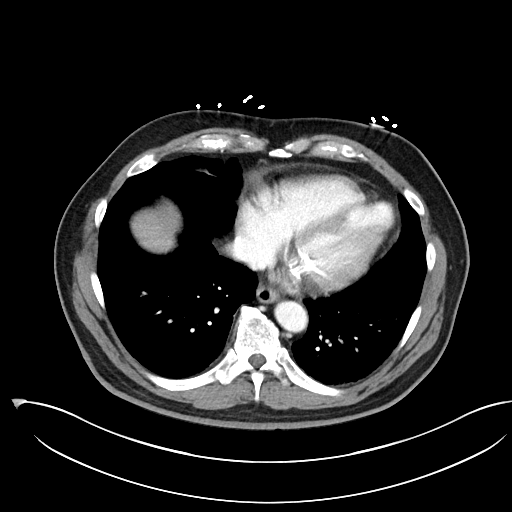

[Series 8: abdomen 3.0 mpr cor · coronal · 0.78mm/px · 3 of 103 slices shown]
[im 35/103  soft-tissue]
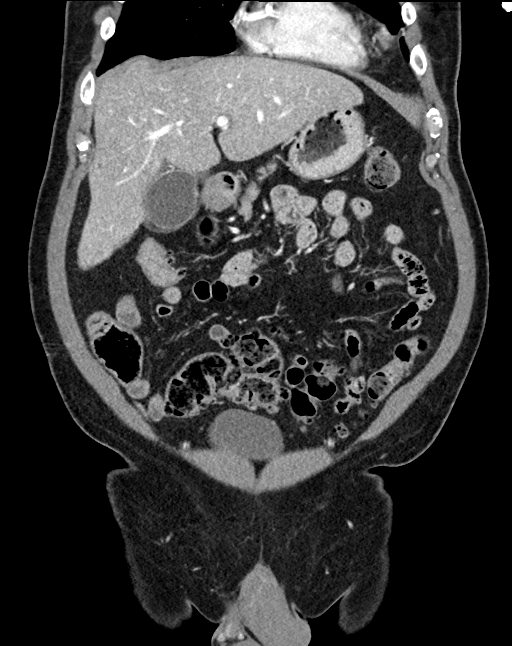
[im 46/103  soft-tissue]
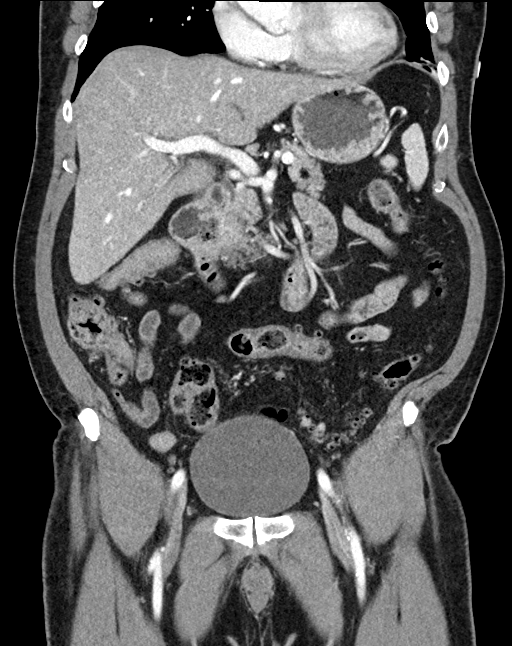
[im 57/103  soft-tissue]
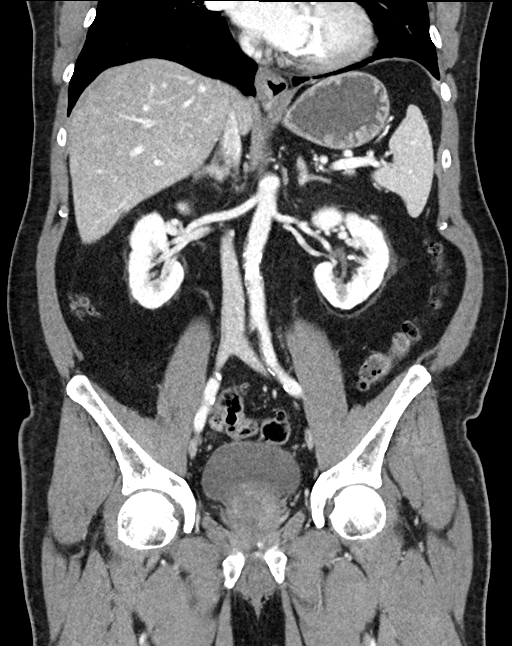

[15 of 46 positions shown; findings below may reference images not displayed]

FINDINGS: Cardiovascular: There is a optimal opacification of the pulmonary
arteries. There is no central,segmental, or subsegmental filling
defects within the pulmonary arteries. The heart is normal in size.
No pericardial effusion or thickening. No evidence right heart
strain. There is normal three-vessel brachiocephalic anatomy without
proximal stenosis. Scattered aortic atherosclerosis is noted.
Coronary artery calcifications are present.

Mediastinum/Nodes: No hilar, mediastinal, or axillary adenopathy.
Thyroid gland, trachea, and esophagus demonstrate no significant
findings.

Lungs/Pleura: The lungs are clear. No pleural effusion or
pneumothorax. No airspace consolidation.

Upper Abdomen: No acute abnormalities present in the visualized
portions of the upper abdomen.

Musculoskeletal: No chest wall abnormality. No acute or significant
osseous findings. Anterior flowing osteophytes in the lower thoracic
spine.

Review of the MIP images confirms the above findings.

Abdomen/pelvis:

Hepatobiliary: There is diffuse low density seen throughout the
liver parenchyma. No focal hepatic lesion is seen. No intra or
extrahepatic biliary ductal dilatation. The main portal vein is
patent. No evidence of calcified gallstones, gallbladder wall
thickening or biliary dilatation.

Pancreas: Unremarkable. No pancreatic ductal dilatation or
surrounding inflammatory changes.

Spleen: Normal in size without focal abnormality.

Adrenals/Urinary Tract: Both adrenal glands appear normal. The
kidneys and collecting system appear normal without evidence of
urinary tract calculus or hydronephrosis. Bladder is unremarkable.

Stomach/Bowel: The stomach and small bowel normal in appearance.
There is extensive colonic diverticulosis. There is question of mild
wall thickening versus under distension seen within the redundant
sigmoid colon, series 5, image 54. No significant surrounding fat
stranding changes however are noted.

Vascular/Lymphatic: There are no enlarged mesenteric,
retroperitoneal, or pelvic lymph nodes. Scattered aortic
atherosclerotic calcifications are seen without aneurysmal
dilatation.

Reproductive: A heterogeneously enlarged prostate gland is noted
which protrudes again seen posterior bladder.

Other: No evidence of abdominal wall mass or hernia.

Musculoskeletal: No acute or significant osseous findings.
IMPRESSION: No central, segmental, or subsegmental pulmonary embolism

Apparent mild wall thickening seen within the redundant sigmoid
colon with extensive diverticulosis. This could be due to
underdistention versus true wall thickening/mild colitis.

Hepatic steatosis

Aortic Atherosclerosis ([id number]).

## 2020-12-04 ENCOUNTER — Ambulatory Visit: Payer: Medicare Other | Admitting: Cardiovascular Disease

## 2020-12-13 ENCOUNTER — Other Ambulatory Visit: Payer: Self-pay | Admitting: Cardiovascular Disease

## 2021-01-03 ENCOUNTER — Other Ambulatory Visit: Payer: Self-pay | Admitting: Cardiovascular Disease

## 2021-01-28 ENCOUNTER — Ambulatory Visit (INDEPENDENT_AMBULATORY_CARE_PROVIDER_SITE_OTHER): Payer: Medicare Other | Admitting: Cardiovascular Disease

## 2021-01-28 ENCOUNTER — Encounter: Payer: Self-pay | Admitting: Cardiovascular Disease

## 2021-01-28 ENCOUNTER — Other Ambulatory Visit: Payer: Self-pay

## 2021-01-28 VITALS — BP 138/84 | HR 80 | Ht 67.0 in | Wt 185.0 lb

## 2021-01-28 DIAGNOSIS — I2510 Atherosclerotic heart disease of native coronary artery without angina pectoris: Secondary | ICD-10-CM | POA: Diagnosis not present

## 2021-01-28 DIAGNOSIS — I5022 Chronic systolic (congestive) heart failure: Secondary | ICD-10-CM

## 2021-01-28 DIAGNOSIS — I255 Ischemic cardiomyopathy: Secondary | ICD-10-CM

## 2021-01-28 NOTE — Patient Instructions (Addendum)
Medication Instructions:  Your physician recommends that you continue on your current medications as directed. Please refer to the Current Medication list given to you today.  *If you need a refill on your cardiac medications before your next appointment, please call your pharmacy*  Tests/Procedures: Your physician has requested that you have an echocardiogram in one year. Echocardiography is a painless test that uses sound waves to create images of your heart. It provides your doctor with information about the size and shape of your heart and how well your heart's chambers and valves are working. This procedure takes approximately one hour. There are no restrictions for this procedure.   Follow-Up: At [facility], you and your health needs are our priority.  As part of our continuing mission to provide you with exceptional heart care, we have created designated Provider Care Teams.  These Care Teams include your primary Cardiologist (physician) and Advanced Practice Providers (APPs -  Physician Assistants and Nurse Practitioners) who all work together to provide you with the care you need, when you need it.  Your next appointment:   1 year(s)  The format for your next appointment:   In Person  Provider:   Jenkins Rouge, MD

## 2021-01-28 NOTE — Progress Notes (Signed)
Cardiology Office Note    Date:  01/28/2021   ID:  [name], DOB [birth year], MRN [id number]  PCP:  Velna Hatchet, MD  Cardiologist: Jenkins Rouge, MD EPS: None  No chief complaint on file.   History of Present Illness:   75 y.o. f/u for advanced CAD and ischemic DCM.  Anterior MI with DES to LAD and circumflex in 2012 [place]. 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 Unstable angina August 2019 cath with Dr Burt Knack. 10/20/17 DES to mid LAD for instent  Restenosis. Other arteries ok except small OM1 not suitable for intervention. TTE with EF 35% F/U MRI 12/25/17 to risk stratify For AICD EF 46% full thickness scar involving distal septum, anterior wall apex and inferior apex. History of GI bleed March 2019 with duodenal erosions that were clipped. Intolerant to ASA. Also history of CKD, HLD, HTN  No angina compliant with meds no recurrent GI bleeding No signs of volume overload or CHF Functional class one   Referred to lipid clinic Did not want PSK 9 injections Wanted to think about Nexletol because it was new    Hospitalized 08/08/19 abdominal pain CT with colitis/diverticulitis Rx with bowel rest and antibiotics with improvement D/c with Cipro    Past Medical History:  Diagnosis Date  . Anxiety   . Arthritis    "wrists" (07/27/2014)  . Coronary artery disease    a.  Pt with 4 stents over the course of 2003-2012;  b.  LHC 6/16:  LAD, RCA, LCx stents patent, OM1 50%, EF 50-55% >> med rx   . Elevated LFTs   . GERD (gastroesophageal reflux disease)   . Hypercholesterolemia   . Hypertension   . Ischemic cardiomyopathy    cMRI 03/2011:  EF 48% with dist ant, septal, apical and inf-apical dyskinesia, no apical clot, dist ant, apical and septal full thickness scar >> No ICD needed  //  Limited echo 7/19: Large apical septal aneurysm, severe hypokinesis of the entire apex and mid to apical segments of the anterior wall, anterolateral wall and anterior septum-consistent with  infarct and most of the LAD territory; EF 35   . Left ventricular aneurysm    Limited echo 7/19: Large apical septal aneurysm, severe hypokinesis of the entire apex and mid to apical segments of the anterior wall, anterolateral wall and anterior septum-consistent with infarct and most of the LAD territory; EF 35  . Myocardial infarction [facility]) 2003    Past Surgical History:  Procedure Laterality Date  . CARDIAC CATHETERIZATION  06/11/2012   Nonobstructive CAD, patent stents, EF 50%, mild apical dyskinesis  . CARDIAC CATHETERIZATION N/A 07/28/2014   Procedure: Left Heart Cath and Coronary Angiography;  Surgeon: Troy Sine, MD;  Location: [facility];  Service: Cardiovascular;  Laterality: N/A;  . CORONARY ANGIOPLASTY WITH STENT PLACEMENT  2003-2012    total of 4 stents place  . CORONARY STENT INTERVENTION N/A 10/20/2017   Procedure: CORONARY STENT INTERVENTION;  Surgeon: Sherren Mocha, MD;  Location: [place] [facility];  Service: Cardiovascular;  Laterality: N/A;  . COSMETIC SURGERY Left 1970's   "dog ripped of my ear"  . LEFT HEART CATH AND CORONARY ANGIOGRAPHY N/A 10/20/2017   Procedure: LEFT HEART CATH AND CORONARY ANGIOGRAPHY;  Surgeon: Sherren Mocha, MD;  Location: [facility] [place] [facility];  Service: Cardiovascular;  Laterality: N/A;  . LEFT HEART CATHETERIZATION WITH CORONARY ANGIOGRAM N/A 06/11/2012   Procedure: LEFT HEART CATHETERIZATION WITH CORONARY ANGIOGRAM;  Surgeon: Anastasija Anfinson M Martinique, MD;  Location: [place] [facility];  Service: Cardiovascular;  Laterality: N/A;    Current Medications: Current Meds  Medication Sig  . carvedilol (COREG) 3.125 MG tablet TAKE 1 TABLET BY MOUTH 2 TIMES DAILY WITH A MEAL.  Marland Kitchen clopidogrel (PLAVIX) 75 MG tablet Take 1 tablet (75 mg total) by mouth daily.  Marland Kitchen dicyclomine (BENTYL) 20 MG tablet Take 20 mg by mouth 2 (two) times daily after a meal.   . ENTRESTO 97-103 MG TAKE 1 TABLET BY MOUTH TWICE A DAY  . nitroGLYCERIN (NITROSTAT) 0.4 MG SL tablet Place 1  tablet (0.4 mg total) under the tongue every 5 (five) minutes as needed for chest pain. For a max of 3 doses. If no relief, call 911.  . omega-3 acid ethyl esters (LOVAZA) 1 G capsule Take 1 g by mouth daily.  . pantoprazole (PROTONIX) 40 MG tablet Take 1 tablet (40 mg total) by mouth daily.  Marland Kitchen zolpidem (AMBIEN) 10 MG tablet Take 1 tablet (10 mg total) by mouth at bedtime as needed. For sleep     Allergies:   Asa [aspirin], Penicillins, and Statins   Social History   Socioeconomic History  . Marital status: Divorced    Spouse name: Not on file  . Number of children: Not on file  . Years of education: Not on file  . Highest education level: Not on file  Occupational History  . Occupation: retired  Tobacco Use  . Smoking status: Former    Packs/day: 1.00    Years: 15.00    Pack years: 15.00    Types: Cigarettes  . Smokeless tobacco: Never  . Tobacco comments:    "quit smoking cigarettes in 1979"  Vaping Use  . Vaping Use: Never used  Substance and Sexual Activity  . Alcohol use: Not Currently    Alcohol/week: 2.0 standard drinks    Types: 2 Glasses of wine per week  . Drug use: No  . Sexual activity: Not Currently  Other Topics Concern  . Not on file  Social History Narrative  . Not on file   Social Determinants of Health   Financial Resource Strain: Not on file  Food Insecurity: Not on file  Transportation Needs: Not on file  Physical Activity: Not on file  Stress: Not on file  Social Connections: Not on file     Family History:  The patient's family history includes Coronary artery disease in his father and mother.   ROS:   Please see the history of present illness.    Review of Systems  Constitutional: Negative.  HENT: Negative.    Cardiovascular: Negative.   Respiratory: Negative.    Endocrine: Negative.   Hematologic/Lymphatic: Negative.   Musculoskeletal: Negative.   Gastrointestinal: Negative.   Genitourinary: Negative.   Neurological: Negative.    All other systems reviewed and are negative.   PHYSICAL EXAM:   VS:  BP 138/84   Pulse 80   Ht 5\' 7"  (1.702 m)   Wt 185 lb (83.9 kg)   SpO2 98%   BMI 28.98 kg/m   Physical Exam  GEN: Well nourished, well developed, in no acute distress  Affect appropriate Healthy:  appears stated age 50: normal Neck supple with no adenopathy JVP normal no bruits no thyromegaly Lungs clear with no wheezing and good diaphragmatic motion Heart:  S1/S2 no murmur, no rub, gallop or click PMI enlarged  Abdomen: benighn, BS positve, no tenderness, no AAA no bruit.  No HSM or HJR Distal pulses intact with no bruits  No edema Neuro non-focal Skin warm and dry No muscular weakness   Wt Readings from Last 3 Encounters:  01/28/21 185 lb (83.9 kg)  12/13/19 189 lb 12.8 oz (86.1 kg)  08/09/19 186 lb (84.4 kg)      Studies/Labs Reviewed:   EKG:   SR rate 69 LAD old anterior MI  Recent Labs: No results found for requested labs within last 8760 hours.   Lipid Panel    Component Value Date/Time   CHOL 210 (H) 06/16/2019 1102   TRIG 350 (H) 06/16/2019 1102   HDL 38 (L) 06/16/2019 1102   CHOLHDL 5.5 (H) 06/16/2019 1102   CHOLHDL 5.3 06/11/2012 0234   VLDL 73 (H) 06/11/2012 0234   LDLCALC 112 (H) 06/16/2019 1102    Additional studies/ records that were reviewed today include:  Cardiac catheterization 8/27/2019Ost 1st Mrg to 1st Mrg lesion is 75% stenosed. Non-stenotic Ost RCA to Mid RCA lesion was previously treated. Mid RCA to Dist RCA lesion is 40% stenosed. Prox Cx to Mid Cx lesion is 30% stenosed. Prox LAD to Mid LAD lesion is 80% stenosed. A drug-eluting stent was successfully placed using a STENT RESOLUTE ONYX 2.5X38. Post intervention, there is a 0% residual stenosis.   1.  Multivessel coronary artery disease with continued patency of the stented segment in the RCA, continued patency of the stented segment in the left circumflex with mild in-stent restenosis and severe stenosis of  a small obtuse marginal branch not suitable for PCI, and severe diffuse stenosis of the proximal and mid LAD treated successfully with overlapping resolute Onyx drug-eluting stents 2.  Known severe LV systolic dysfunction   Recommendation: The patient should continue on clopidogrel alone.  He is intolerant to aspirin.  I would favor long-term clopidogrel since he is intolerant to aspirin and has been treated with multiple stents.   Recommend dual antiplatelet therapy with Clopidogrel 75mg  daily long-term (beyond 12 months) because of multiple stents, severe LV dysfunction, ASA intolerance. Echo 09/01/17 Impressions:   - Limited Definity enhanced study for LV wall motion and thrombus.   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   There is severe hypokinesis in the entire apex and the mid-apical   segments of the anterior wall, anterolateral wall and anterior   septum.   This is consistent with infarction in most of the LAD artery   territory.   Overall LV ejection fraction is approximately 35%.     Left Heart Cath and Coronary Angiography  07/2014  Conclusion    Ost 1st Mrg to 1st Mrg lesion, 50% stenosed. Ost LAD to Dist LAD lesion, 20% stenosed.   Low normal LV function with an ejection fraction of 50-55% with evidence for an akinetic/dyskinetic aneurysmal apex.   Coronary artery  disease with patent tandem proximal to mid LAD stents with smooth 20% in-stent narrowing in the distal aspect of the stent; patent  left circumflex stent with evidence for 50-60% proximal narrowing in a small marginal branch which arises with aupper takeoff from this stented segment; and widely patent RCA stent in a dominant RCA system.   RECOMMENDATION:   Medical therapy.       ASSESSMENT:    No diagnosis found.   PLAN:  In order of problems listed above:  CAD status post MIs in 2012 in [place] treated with DES to the LAD circumflex and RCA.  August 2019 with unstable angina treated with overlapping  DES to the LAD 10/20/2017.  He is aspirin intolerant so plan for  long-term clopidogrel.  No angina.    Ischemic cardiomyopathy ejection fraction 35% on echo 08/2017.  Started on Entresto MRI with EF 46% 12/25/17 continue medical Rx  HTN:  Well controlled.  Continue current medications and low sodium Dash type diet. Tends toward bradycardia so beta blocker not increased    CKD stage III creatinine was 1.21 October 2019 followed by [facility]   Hyperlipidemia LDL was 92 August 2021 Only on Lovaza    History of GI bleed in [place] 04/2017 secondary to large erosion and duodenal bulb that was clipped.    Medication Adjustments/Labs and Tests Ordered: Current medicines are reviewed at length with the patient today.  Concerns regarding medicines are outlined above.  Medication changes, Labs and Tests ordered today are listed in the Patient Instructions below. There are no Patient Instructions on file for this visit.    Signed, Jenkins Rouge, MD  01/28/2021 9:52 AM    [address], [place], [place]  [address] Phone: [phone]; Fax: [phone]

## 2021-03-17 ENCOUNTER — Emergency Department (HOSPITAL_COMMUNITY)
Admission: EM | Admit: 2021-03-17 | Discharge: 2021-03-17 | Disposition: A | Discharge status: Home / Self Care | Payer: Medicare Other | Attending: Emergency Medicine | Admitting: Emergency Medicine

## 2021-03-17 DIAGNOSIS — Z5321 Procedure and treatment not carried out due to patient leaving prior to being seen by health care provider: Secondary | ICD-10-CM | POA: Insufficient documentation

## 2021-03-17 DIAGNOSIS — R110 Nausea: Secondary | ICD-10-CM | POA: Insufficient documentation

## 2021-03-17 NOTE — ED Notes (Signed)
The pt went back to [place] not  will return if he feels ill

## 2021-03-17 NOTE — ED Triage Notes (Signed)
The pt bought some ,assed potatoes at [employer]   one hour ago he stired up the mash potatoes and there appeared  a one inch  waht appears to be meat  hamburger or other.  He is not feelin sick   but he wants it analyzed  I have givem the choice of taking the potratoes back to the [employer] he bought it at   they can tell him wahts in the mashed potatoes  the pt was agrreeable to that and if he feels sick  he will return  hes not sick now  he left to go to [employer]

## 2021-04-15 ENCOUNTER — Ambulatory Visit: Payer: Medicare Other | Admitting: Cardiovascular Disease

## 2021-04-26 ENCOUNTER — Other Ambulatory Visit: Payer: Self-pay | Admitting: Cardiovascular Disease

## 2021-05-27 ENCOUNTER — Telehealth: Payer: Self-pay | Admitting: *Deleted

## 2021-05-27 NOTE — Telephone Encounter (Signed)
? ?  Pre-operative Risk Assessment  ?  ?Patient Name: [name]  ?DOB: 01-Oct-1945 ?MRN: [id number]  ? ?  ? ?Request for Surgical Clearance   ? ?Procedure:   COLONOSCOPY ? ?Date of Surgery:  Clearance 09/03/21                              ?   ?Surgeon:  DR. Paulita Fujita ?Surgeon's Group or Practice Name:  [facility] ?Phone number:  [phone] ?Fax number:  [phone] ?  ?Type of Clearance Requested:   ?- Medical  ?- Pharmacy:  Hold Clopidogrel (Plavix) x  5 DAYS PRIOR ?  ?Type of Anesthesia:   PROPOFOL ?  ?Additional requests/questions:   ? ?Signed, ?Julaine Hua   ?05/27/2021, 5:32 PM  ? ?

## 2021-05-28 NOTE — Telephone Encounter (Signed)
Per Dr. Johnsie Cancel, okay to hold Plavix.  Please arrange virtual telephone visit with preop APP around mid May for cardiac clearance considering actual colonoscopy is in mid July ?

## 2021-05-28 NOTE — Telephone Encounter (Signed)
Dr. Johnsie Cancel to review.  Patient previously had CAD, ischemic cardiomyopathy, hypertension, hyperlipidemia, and CKD.  Patient had anterior MI with DES to LAD at the left circumflex artery in 2012 in [place].  Recurrent MI in July 2012 required stenting of RCA.  Last cardiac catheterization performed by Dr. Burt Knack in August 2019.  He underwent DES to mid LAD for in-stent restenosis.  75% small OM1 lesion was managed medically.  40% mid to distal RCA, 30% proximal to mid left circumflex artery disease.  Patient was last seen in the cardiology office in December 2022 at which time he was doing well without angina.  He is intolerant of aspirin and stays on Plavix monotherapy. ? ?Dr. Johnsie Cancel, would you be okay with the patient stopping Plavix for 5 days prior to GI procedure? ? ?Please forward your response to [facility] PREOP ? ?GI procedures in July, plan to set up virtual televisit in May for final cardiac clearance unless MD plans to clear the patient now ? ? ? ?

## 2021-05-29 ENCOUNTER — Telehealth: Payer: Self-pay | Admitting: *Deleted

## 2021-05-29 NOTE — Telephone Encounter (Signed)
Pt agreeable to plan of care for tele pre op appt 06/19/21 @ 1 pm. Med rec will need to be done the day before. Consent has been done.  ? ?  ?Patient Consent for Virtual Visit  ? ? ?   ? ?[name] has provided verbal consent on 05/29/2021 for a virtual visit (video or telephone). ? ? ?CONSENT FOR VIRTUAL VISIT FOR:  [name]  ?By participating in this virtual visit I agree to the following: ? ?I hereby voluntarily request, consent and authorize [facility] and its employed or contracted physicians, physician assistants, nurse practitioners or other licensed health care professionals (the Practitioner), to provide me with telemedicine health care services (the ?Services") as deemed necessary by the treating Practitioner. I acknowledge and consent to receive the Services by the Practitioner via telemedicine. I understand that the telemedicine visit will involve communicating with the Practitioner through live audiovisual communication technology and the disclosure of certain medical information by electronic transmission. I acknowledge that I have been given the opportunity to request an in-person assessment or other available alternative prior to the telemedicine visit and am voluntarily participating in the telemedicine visit. ? ?I understand that I have the right to withhold or withdraw my consent to the use of telemedicine in the course of my care at any time, without affecting my right to future care or treatment, and that the Practitioner or I may terminate the telemedicine visit at any time. I understand that I have the right to inspect all information obtained and/or recorded in the course of the telemedicine visit and may receive copies of available information for a reasonable fee.  I understand that some of the potential risks of receiving the Services via telemedicine include:  ?Delay or interruption in medical evaluation due to technological equipment failure or disruption; ?Information  transmitted may not be sufficient (e.g. poor resolution of images) to allow for appropriate medical decision making by the Practitioner; and/or  ?In rare instances, security protocols could fail, causing a breach of personal health information. ? ?Furthermore, I acknowledge that it is my responsibility to provide information about my medical history, conditions and care that is complete and accurate to the best of my ability. I acknowledge that Practitioner's advice, recommendations, and/or decision may be based on factors not within their control, such as incomplete or inaccurate data provided by me or distortions of diagnostic images or specimens that may result from electronic transmissions. I understand that the practice of medicine is not an exact science and that Practitioner makes no warranties or guarantees regarding treatment outcomes. I acknowledge that a copy of this consent can be made available to me via my patient portal ([facility]), or I can request a printed copy by calling the office of [facility].   ? ?I understand that my insurance will be billed for this visit.  ? ?I have read or had this consent read to me. ?I understand the contents of this consent, which adequately explains the benefits and risks of the Services being provided via telemedicine.  ?I have been provided ample opportunity to ask questions regarding this consent and the Services and have had my questions answered to my satisfaction. ?I give my informed consent for the services to be provided through the use of telemedicine in my medical care ? ? ? ?

## 2021-05-29 NOTE — Telephone Encounter (Signed)
Pt agreeable to plan of care for tele pre op appt 06/19/21 @ 1 pm. Med rec will need to be done the day before. Consent has been done.  ?  ?  ?Patient Consent for Virtual Visit  ?  ? ?   ?  ?[name] has provided verbal consent on 05/29/2021 for a virtual visit (video or telephone). ?  ?  ?CONSENT FOR VIRTUAL VISIT FOR:  [name]  ?By participating in this virtual visit I agree to the following: ?  ?I hereby voluntarily request, consent and authorize [facility] and its employed or contracted physicians, physician assistants, nurse practitioners or other licensed health care professionals (the Practitioner), to provide me with telemedicine health care services (the ?Services") as deemed necessary by the treating Practitioner. I acknowledge and consent to receive the Services by the Practitioner via telemedicine. I understand that the telemedicine visit will involve communicating with the Practitioner through live audiovisual communication technology and the disclosure of certain medical information by electronic transmission. I acknowledge that I have been given the opportunity to request an in-person assessment or other available alternative prior to the telemedicine visit and am voluntarily participating in the telemedicine visit. ? ?I understand that I have the right to withhold or withdraw my consent to the use of telemedicine in the course of my care at any time, without affecting my right to future care or treatment, and that the Practitioner or I may terminate the telemedicine visit at any time. I understand that I have the right to inspect all information obtained and/or recorded in the course of the telemedicine visit and may receive copies of available information for a reasonable fee.  I understand that some of the potential risks of receiving the Services via telemedicine include:  ?Delay or interruption in medical evaluation due to technological equipment failure or disruption; ?Information  transmitted may not be sufficient (e.g. poor resolution of images) to allow for appropriate medical decision making by the Practitioner; and/or  ?In rare instances, security protocols could fail, causing a breach of personal health information. ?  ?Furthermore, I acknowledge that it is my responsibility to provide information about my medical history, conditions and care that is complete and accurate to the best of my ability. I acknowledge that Practitioner's advice, recommendations, and/or decision may be based on factors not within their control, such as incomplete or inaccurate data provided by me or distortions of diagnostic images or specimens that may result from electronic transmissions. I understand that the practice of medicine is not an exact science and that Practitioner makes no warranties or guarantees regarding treatment outcomes. I acknowledge that a copy of this consent can be made available to me via my patient portal ([facility]), or I can request a printed copy by calling the office of [facility].   ?  ?I understand that my insurance will be billed for this visit.  ?  ?I have read or had this consent read to me. ?I understand the contents of this consent, which adequately explains the benefits and risks of the Services being provided via telemedicine.  ?I have been provided ample opportunity to ask questions regarding this consent and the Services and have had my questions answered to my satisfaction. ?I give my informed consent for the services to be provided through the use of telemedicine in my medical care ?  ?  ?  ?

## 2021-05-30 ENCOUNTER — Other Ambulatory Visit: Payer: Self-pay | Admitting: Cardiovascular Disease

## 2021-06-03 ENCOUNTER — Other Ambulatory Visit: Payer: Self-pay | Admitting: Cardiovascular Disease

## 2021-07-19 ENCOUNTER — Ambulatory Visit (INDEPENDENT_AMBULATORY_CARE_PROVIDER_SITE_OTHER): Payer: Medicare Other | Admitting: General Practice

## 2021-07-19 DIAGNOSIS — Z01810 Encounter for preprocedural cardiovascular examination: Secondary | ICD-10-CM

## 2021-07-19 NOTE — Progress Notes (Signed)
Virtual Visit via Telephone Note   Because of [name] co-morbid illnesses, he is at least at moderate risk for complications without adequate follow up.  This format is felt to be most appropriate for this patient at this time.  The patient did not have access to video technology/had technical difficulties with video requiring transitioning to audio format only (telephone).  All issues noted in this document were discussed and addressed.  No physical exam could be performed with this format.  Please refer to the patient's chart for his consent to telehealth for [facility].  Evaluation Performed:  Preoperative cardiovascular risk assessment _____________   Date:  07/19/2021   Patient ID:  [name], DOB [birth year], MRN [id number] Patient Location:  Home Provider location:   Office  Primary Care Provider:  Velna Hatchet, MD Primary Cardiologist:  [name] Rouge, MD  Chief Complaint / Patient Profile   76 y.o. y/o male with a h/o ischemic cardiomyopathy, coronary artery disease, chronic systolic CHF who is pending colonoscopy and presents today for telephonic preoperative cardiovascular risk assessment.  Past Medical History    Past Medical History:  Diagnosis Date   Anxiety    Arthritis    "wrists" (07/27/2014)   Coronary artery disease    a.  Pt with 4 stents over the course of 2003-2012;  b.  LHC 6/16:  LAD, RCA, LCx stents patent, OM1 50%, EF 50-55% >> med rx    Elevated LFTs    GERD (gastroesophageal reflux disease)    Hypercholesterolemia    Hypertension    Ischemic cardiomyopathy    cMRI 03/2011:  EF 48% with dist ant, septal, apical and inf-apical dyskinesia, no apical clot, dist ant, apical and septal full thickness scar >> No ICD needed  //  Limited echo 7/19: Large apical septal aneurysm, severe hypokinesis of the entire apex and mid to apical segments of the anterior wall, anterolateral wall and anterior septum-consistent with infarct and most of the LAD  territory; EF 35    Left ventricular aneurysm    Limited echo 7/19: Large apical septal aneurysm, severe hypokinesis of the entire apex and mid to apical segments of the anterior wall, anterolateral wall and anterior septum-consistent with infarct and most of the LAD territory; EF 35   Myocardial infarction ([place]) 2003   Past Surgical History:  Procedure Laterality Date   CARDIAC CATHETERIZATION  06/11/2012   Nonobstructive CAD, patent stents, EF 50%, mild apical dyskinesis   CARDIAC CATHETERIZATION N/A 07/28/2014   Procedure: Left Heart Cath and Coronary Angiography;  Surgeon: Troy Sine, MD;  Location: [place] [facility];  Service: Cardiovascular;  Laterality: N/A;   CORONARY ANGIOPLASTY WITH STENT PLACEMENT  2003-2012    total of 4 stents place   CORONARY STENT INTERVENTION N/A 10/20/2017   Procedure: CORONARY STENT INTERVENTION;  Surgeon: Sherren Mocha, MD;  Location: [place] [facility];  Service: Cardiovascular;  Laterality: N/A;   COSMETIC SURGERY Left 1970's   "dog ripped of my ear"   LEFT HEART CATH AND CORONARY ANGIOGRAPHY N/A 10/20/2017   Procedure: LEFT HEART CATH AND CORONARY ANGIOGRAPHY;  Surgeon: Sherren Mocha, MD;  Location: [place] [facility];  Service: Cardiovascular;  Laterality: N/A;   LEFT HEART CATHETERIZATION WITH CORONARY ANGIOGRAM N/A 06/11/2012   Procedure: LEFT HEART CATHETERIZATION WITH CORONARY ANGIOGRAM;  Surgeon: Peter M Martinique, MD;  Location: [facility] CATH LAB;  Service: Cardiovascular;  Laterality: N/A;    Allergies  Allergies  Allergen Reactions   Asa [Aspirin] Other (See  Comments)    Abdominal bleeding   Penicillins Anaphylaxis    Has patient had a PCN reaction causing immediate rash, facial/tongue/throat swelling, SOB or lightheadedness with hypotension: Yes Has patient had a PCN reaction causing severe rash involving mucus membranes or skin necrosis: No Has patient had a PCN reaction that required hospitalization: No Has patient had a PCN reaction  occurring within the last 10 years: No If all of the above answers are "NO", then may proceed with Cephalosporin use.   Statins Other (See Comments)    Stiff joints, muscle tightness, couldn't walk    History of Present Illness    [name] is a 76 y.o. male who presents via audio/video conferencing for a telehealth visit today.  Pt was last seen in cardiology clinic on 01/28/2021 by Dr. Johnsie Cancel.  At that time [name] was doing well .  The patient is now pending procedure as outlined above. Since his last visit, he remains stable from a cardiac standpoint.  Today he denies chest pain, shortness of breath, lower extremity edema, fatigue, palpitations, melena, hematuria, hemoptysis, diaphoresis, weakness, presyncope, syncope, orthopnea, and PND.   Home Medications    Prior to Admission medications   Medication Sig Start Date End Date Taking? Authorizing Provider  carvedilol (COREG) 3.125 MG tablet TAKE 1 TABLET BY MOUTH 2 TIMES DAILY WITH A MEAL. 04/26/21   Josue Hector, MD  clopidogrel (PLAVIX) 75 MG tablet Take 1 tablet (75 mg total) by mouth daily. 06/11/12   Arguello, Roger A, PA-C  dicyclomine (BENTYL) 20 MG tablet Take 20 mg by mouth 2 (two) times daily after a meal.     [provider]  nitroGLYCERIN (NITROSTAT) 0.4 MG SL tablet PLACE 1 TABLET (0.4 MG TOTAL) UNDER THE TONGUE EVERY 5 (FIVE) MINUTES AS NEEDED FOR CHEST PAIN. FOR A MAX OF 3 DOSES. IF NO RELIEF, CALL 911. 06/03/21   Josue Hector, MD  omega-3 acid ethyl esters (LOVAZA) 1 G capsule Take 1 g by mouth daily.    [provider]  pantoprazole (PROTONIX) 40 MG tablet Take 1 tablet (40 mg total) by mouth daily. 04/26/12   Josue Hector, MD  sacubitril-valsartan (ENTRESTO) 97-103 MG Take 1 tablet by mouth 2 (two) times daily. 05/30/21   Josue Hector, MD  zolpidem (AMBIEN) 10 MG tablet Take 1 tablet (10 mg total) by mouth at bedtime as needed. For sleep 07/06/12   Josue Hector, MD    Physical Exam     Vital Signs:  Stpehen [name] does not have vital signs available for review today.  Given telephonic nature of communication, physical exam is limited. AAOx3. NAD. Normal affect.  Speech and respirations are unlabored.  Accessory Clinical Findings    None  Assessment & Plan    1.  Preoperative Cardiovascular Risk Assessment: Colonoscopy, Dr. Paulita Fujita     Primary Cardiologist: [name] Rouge, MD  Chart reviewed as part of pre-operative protocol coverage. Given past medical history and time since last visit, based on ACC/AHA guidelines, Noor Vidales would be at acceptable risk for the planned procedure without further cardiovascular testing.   Patient was advised that if he develops new symptoms prior to surgery to contact our office to arrange a follow-up appointment.  He verbalized understanding.   His Plavix may be held for 5 days prior to his procedure.  Please resume as soon as hemostasis is achieved.    A copy of this note will be routed to requesting surgeon.  Time:  Today, I have spent 7 minutes with the patient with telehealth technology discussing medical history, symptoms, and management plan.  I spent greater than 10 minutes reviewing the patient's past medical history and medications.   Deberah Pelton, NP  07/19/2021, 7:43 AM

## 2022-01-20 ENCOUNTER — Ambulatory Visit (HOSPITAL_COMMUNITY): Payer: Medicare Other | Attending: Cardiovascular Disease

## 2022-01-20 DIAGNOSIS — I255 Ischemic cardiomyopathy: Secondary | ICD-10-CM | POA: Diagnosis present

## 2022-01-20 LAB — ECHOCARDIOGRAM COMPLETE
Area-P 1/2: 3.01 cm2
P 1/2 time: 817 msec
S' Lateral: 3.4 cm

## 2022-01-22 ENCOUNTER — Other Ambulatory Visit: Payer: Self-pay | Admitting: Cardiovascular Disease

## 2022-03-03 ENCOUNTER — Other Ambulatory Visit: Payer: Self-pay | Admitting: Cardiovascular Disease

## 2022-03-04 MED ORDER — ENTRESTO 97-103 MG PO TABS: 1.0000 | ORAL_TABLET | Freq: Two times a day (BID) | ORAL | 1 refills | Status: DC

## 2022-03-06 NOTE — Progress Notes (Signed)
Cardiology Office Note    Date:  03/14/2022   ID:  [name], DOB 06/06/1945, MRN [id number]  PCP:  Velna Hatchet, MD  Cardiologist: Jenkins Rouge, MD   History of Present Illness:   77 y.o. f/u for advanced CAD and ischemic DCM.  Anterior MI with DES to LAD and circumflex in 2012 in  [place]. 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 Unstable angina August 2019 cath with Dr Burt Knack. 10/20/17 DES to mid LAD for instent  Restenosis. Other arteries ok except small OM1 not suitable for intervention.   TTE with EF 35% F/U MRI 12/25/17 to risk stratify For AICD EF 46% full thickness scar involving distal septum, anterior wall apex and inferior apex.   History of GI bleed March 2019 with duodenal erosions that were clipped. Intolerant to ASA. Also history of CKD, HLD, HTN  No angina compliant with meds no recurrent GI bleeding No signs of volume overload or CHF Functional class one   Referred to lipid clinic Did not want PSK 9 injections Wanted to think about Nexletol because it was new    Hospitalized 08/08/19 abdominal pain CT with colitis/diverticulitis Rx with bowel rest and antibiotics with improvement D/c with Cipro  Colonoscopy with Dr Paulita Fujita ok      Past Medical History:  Diagnosis Date   Anxiety    Arthritis    "wrists" (07/27/2014)   Coronary artery disease    a.  Pt with 4 stents over the course of 2003-2012;  b.  LHC 6/16:  LAD, RCA, LCx stents patent, OM1 50%, EF 50-55% >> med rx    Elevated LFTs    GERD (gastroesophageal reflux disease)    Hypercholesterolemia    Hypertension    Ischemic cardiomyopathy    cMRI 03/2011:  EF 48% with dist ant, septal, apical and inf-apical dyskinesia, no apical clot, dist ant, apical and septal full thickness scar >> No ICD needed  //  Limited echo 7/19: Large apical septal aneurysm, severe hypokinesis of the entire apex and mid to apical segments of the anterior wall, anterolateral wall and anterior septum-consistent with infarct  and most of the LAD territory; EF 35    Left ventricular aneurysm    Limited echo 7/19: Large apical septal aneurysm, severe hypokinesis of the entire apex and mid to apical segments of the anterior wall, anterolateral wall and anterior septum-consistent with infarct and most of the LAD territory; EF 35   Myocardial infarction ([place]) 2003    Past Surgical History:  Procedure Laterality Date   CARDIAC CATHETERIZATION  06/11/2012   Nonobstructive CAD, patent stents, EF 50%, mild apical dyskinesis   CARDIAC CATHETERIZATION N/A 07/28/2014   Procedure: Left Heart Cath and Coronary Angiography;  Surgeon: Troy Sine, MD;  Location: [place] [facility];  Service: Cardiovascular;  Laterality: N/A;   CORONARY ANGIOPLASTY WITH STENT PLACEMENT  2003-2012    total of 4 stents place   CORONARY STENT INTERVENTION N/A 10/20/2017   Procedure: CORONARY STENT INTERVENTION;  Surgeon: Sherren Mocha, MD;  Location: [place] [facility];  Service: Cardiovascular;  Laterality: N/A;   COSMETIC SURGERY Left 1970's   "dog ripped of my ear"   LEFT HEART CATH AND CORONARY ANGIOGRAPHY N/A 10/20/2017   Procedure: LEFT HEART CATH AND CORONARY ANGIOGRAPHY;  Surgeon: Sherren Mocha, MD;  Location: [place] [facility];  Service: Cardiovascular;  Laterality: N/A;   LEFT HEART CATHETERIZATION WITH CORONARY ANGIOGRAM N/A 06/11/2012   Procedure: LEFT HEART CATHETERIZATION WITH CORONARY ANGIOGRAM;  Surgeon: Dhilan Brauer M Martinique, MD;  Location[facility] C[facility] LAB;  Service: Cardiovascular;  Laterality: N/A;    Current Medications: Current Meds  Medication Sig   carvedilol (COREG) 3.125 MG tablet TAKE 1 TABLET BY MOUTH TWICE A DAY WITH MEALS   clopidogrel (PLAVIX) 75 MG tablet Take 1 tablet (75 mg total) by mouth daily.   dicyclomine (BENTYL) 20 MG tablet Take 20 mg by mouth 2 (two) times daily after a meal.    nitroGLYCERIN (NITROSTAT) 0.4 MG SL tablet PLACE 1 TABLET (0.4 MG TOTAL) UNDER THE TONGUE EVERY 5 (FIVE) MINUTES AS NEEDED FOR CHEST  PAIN. FOR A MAX OF 3 DOSES. IF NO RELIEF, CALL 911.   omega-3 acid ethyl esters (LOVAZA) 1 G capsule Take 1 g by mouth daily.   pantoprazole (PROTONIX) 40 MG tablet Take 1 tablet (40 mg total) by mouth daily.   sacubitril-valsartan (ENTRESTO) 97-103 MG Take 1 tablet by mouth 2 (two) times daily.   zolpidem (AMBIEN) 10 MG tablet Take 1 tablet (10 mg total) by mouth at bedtime as needed. For sleep     Allergies:   Asa [aspirin], Penicillins, and Statins   Social History   Socioeconomic History   Marital status: Divorced    Spouse name: Not on file   Number of children: Not on file   Years of education: Not on file   Highest education level: Not on file  Occupational History   Occupation: retired  Tobacco Use   Smoking status: Former    Packs/day: 1.00    Years: 15.00    Total pack years: 15.00    Types: Cigarettes   Smokeless tobacco: Never   Tobacco comments:    "quit smoking cigarettes in 1979"  Vaping Use   Vaping Use: Never used  Substance and Sexual Activity   Alcohol use: Not Currently    Alcohol/week: 2.0 standard drinks of alcohol    Types: 2 Glasses of wine per week   Drug use: No   Sexual activity: Not Currently  Other Topics Concern   Not on file  Social History Narrative   Not on file   Social Determinants of Health   Financial Resource Strain: Not on file  Food Insecurity: Not on file  Transportation Needs: Not on file  Physical Activity: Not on file  Stress: Not on file  Social Connections: Not on file     Family History:  The patient's family history includes Coronary artery disease in his father and mother.   ROS:   Please see the history of present illness.    Review of Systems  Constitutional: Negative.  HENT: Negative.    Cardiovascular: Negative.   Respiratory: Negative.    Endocrine: Negative.   Hematologic/Lymphatic: Negative.   Musculoskeletal: Negative.   Gastrointestinal: Negative.   Genitourinary: Negative.   Neurological:  Negative.    All other systems reviewed and are negative.   PHYSICAL EXAM:   VS:  BP (!) 132/58   Pulse 90   Ht '5\' 7"'$  (1.702 m)   Wt 193 lb 3.2 oz (87.6 kg)   SpO2 99%   BMI 30.26 kg/m   Physical Exam  GEN: Well nourished, well developed, in no acute distress  Affect appropriate Healthy:  appears stated age 25: normal Neck supple with no adenopathy JVP normal no bruits no thyromegaly Lungs clear with no wheezing and good diaphragmatic motion Heart:  S1/S2 no murmur, no rub, gallop or click PMI enlarged  Abdomen: benighn, BS positve, no tenderness, no  AAA no bruit.  No HSM or HJR Distal pulses intact with no bruits No edema Neuro non-focal Skin warm and dry No muscular weakness   Wt Readings from Last 3 Encounters:  03/14/22 193 lb 3.2 oz (87.6 kg)  01/28/21 185 lb (83.9 kg)  12/13/19 189 lb 12.8 oz (86.1 kg)      Studies/Labs Reviewed:   EKG:   03/14/2022 SR RBBB no acute changes  Recent Labs: No results found for requested labs within last 365 days.   Lipid Panel    Component Value Date/Time   CHOL 210 (H) 06/16/2019 1102   TRIG 350 (H) 06/16/2019 1102   HDL 38 (L) 06/16/2019 1102   CHOLHDL 5.5 (H) 06/16/2019 1102   CHOLHDL 5.3 06/11/2012 0234   VLDL 73 (H) 06/11/2012 0234   LDLCALC 112 (H) 06/16/2019 1102    Additional studies/ records that were reviewed today include:  Cardiac catheterization 8/27/2019Ost 1st Mrg to 1st Mrg lesion is 75% stenosed. Non-stenotic Ost RCA to Mid RCA lesion was previously treated. Mid RCA to Dist RCA lesion is 40% stenosed. Prox Cx to Mid Cx lesion is 30% stenosed. Prox LAD to Mid LAD lesion is 80% stenosed. A drug-eluting stent was successfully placed using a STENT RESOLUTE ONYX 2.5X38. Post intervention, there is a 0% residual stenosis.   1.  Multivessel coronary artery disease with continued patency of the stented segment in the RCA, continued patency of the stented segment in the left circumflex with mild in-stent  restenosis and severe stenosis of a small obtuse marginal branch not suitable for PCI, and severe diffuse stenosis of the proximal and mid LAD treated successfully with overlapping resolute Onyx drug-eluting stents 2.  Known severe LV systolic dysfunction   Recommendation: The patient should continue on clopidogrel alone.  He is intolerant to aspirin.  I would favor long-term clopidogrel since he is intolerant to aspirin and has been treated with multiple stents.   Recommend dual antiplatelet therapy with Clopidogrel '75mg'$  daily long-term (beyond 12 months) because of multiple stents, severe LV dysfunction, ASA intolerance. Echo 09/01/17 Impressions:   - Limited Definity enhanced study for LV wall motion and thrombus.   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   There is severe hypokinesis in the entire apex and the mid-apical   segments of the anterior wall, anterolateral wall and anterior   septum.   This is consistent with infarction in most of the LAD artery   territory.   Overall LV ejection fraction is approximately 35%.     Left Heart Cath and Coronary Angiography  07/2014  Conclusion    Ost 1st Mrg to 1st Mrg lesion, 50% stenosed. Ost LAD to Dist LAD lesion, 20% stenosed.   Low normal LV function with an ejection fraction of 50-55% with evidence for an akinetic/dyskinetic aneurysmal apex.   Coronary artery  disease with patent tandem proximal to mid LAD stents with smooth 20% in-stent narrowing in the distal aspect of the stent; patent  left circumflex stent with evidence for 50-60% proximal narrowing in a small marginal branch which arises with aupper takeoff from this stented segment; and widely patent RCA stent in a dominant RCA system.   RECOMMENDATION:   Medical therapy.    PLAN:  In order of problems listed above:  CAD status post MIs in 2012 in [place] treated with DES to the LAD circumflex and RCA.  August 2019 with unstable angina treated with overlapping DES to the  LAD 10/20/2017.  He is aspirin intolerant  so plan for long-term clopidogrel.  No angina.    Ischemic cardiomyopathy ejection fraction 35% on echo 08/2017.  Started on Entresto MRI with EF 46% 12/25/17 TTE 01/20/22 with apical aneurysm and EF 45-50% with mild AR/MR Continue GDMT coreg, entresto   HTN:  Well controlled.  Continue current medications and low sodium Dash type diet. Tends toward bradycardia so beta blocker not increased    CKD stage III creatinine was 1.4  03/06/22  baseline f/u primary   Hyperlipidemia LDL 115 03/06/22 Intolerant to statin Declined PSK9     History of GI bleed in [place] 04/2017 secondary to large erosion and duodenal bulb that was clipped.to f/u with Dr Paulita Fujita to have colonoscopy Ok to hold plavix for 5 days    Medication Adjustments/Labs and Tests Ordered: Current medicines are reviewed at length with the patient today.  Concerns regarding medicines are outlined above.  Medication changes, Labs and Tests ordered today are listed in the Patient Instructions below. Patient Instructions  Medication Instructions:  Your physician recommends that you continue on your current medications as directed. Please refer to the Current Medication list given to you today.  *If you need a refill on your cardiac medications before your next appointment, please call your pharmacy*  Lab Work: If you have labs (blood work) drawn today and your tests are completely normal, you will receive your results only by: [place] (if you have MyChart) OR A paper copy in the mail If you have any lab test that is abnormal or we need to change your treatment, we will call you to review the results.  Testing/Procedures: None ordered today.  Follow-Up: At [facility], you and your health needs are our priority.  As part of our continuing mission to provide you with exceptional heart care, we have created designated Provider Care Teams.  These Care Teams include your primary  Cardiologist (physician) and Advanced Practice Providers (APPs -  Physician Assistants and Nurse Practitioners) who all work together to provide you with the care you need, when you need it.  We recommend signing up for the patient portal called "MyChart".  Sign up information is provided on this After Visit Summary.  MyChart is used to connect with patients for Virtual Visits (Telemedicine).  Patients are able to view lab/test results, encounter notes, upcoming appointments, etc.  Non-urgent messages can be sent to your provider as well.   To learn more about what you can do with MyChart, go to [email or web address].    Your next appointment:   6 month(s)  Provider:   Jenkins Rouge, MD     Other Instructions   F/U in 6 months   Signed, Jenkins Rouge, MD  03/14/2022 10:05 AM    [address], [place], [place]  [address] Phone: [phone]; Fax: [phone]

## 2022-03-14 ENCOUNTER — Ambulatory Visit: Payer: 59 | Attending: Cardiovascular Disease | Admitting: Cardiovascular Disease

## 2022-03-14 ENCOUNTER — Encounter: Payer: Self-pay | Admitting: Cardiovascular Disease

## 2022-03-14 VITALS — BP 132/58 | HR 90 | Ht 67.0 in | Wt 193.2 lb

## 2022-03-14 DIAGNOSIS — I5033 Acute on chronic diastolic (congestive) heart failure: Secondary | ICD-10-CM | POA: Diagnosis not present

## 2022-03-14 DIAGNOSIS — E782 Mixed hyperlipidemia: Secondary | ICD-10-CM | POA: Diagnosis not present

## 2022-03-14 DIAGNOSIS — I2510 Atherosclerotic heart disease of native coronary artery without angina pectoris: Secondary | ICD-10-CM | POA: Diagnosis not present

## 2022-03-14 DIAGNOSIS — I255 Ischemic cardiomyopathy: Secondary | ICD-10-CM

## 2022-03-14 NOTE — Patient Instructions (Addendum)
Medication Instructions:  Your physician recommends that you continue on your current medications as directed. Please refer to the Current Medication list given to you today.  *If you need a refill on your cardiac medications before your next appointment, please call your pharmacy*  Lab Work: If you have labs (blood work) drawn today and your tests are completely normal, you will receive your results only by: MyChart Message (if you have MyChart) OR A paper copy in the mail If you have any lab test that is abnormal or we need to change your treatment, we will call you to review the results.  Testing/Procedures: None ordered today.  Follow-Up: At [facility], you and your health needs are our priority.  As part of our continuing mission to provide you with exceptional heart care, we have created designated Provider Care Teams.  These Care Teams include your primary Cardiologist (physician) and Advanced Practice Providers (APPs -  Physician Assistants and Nurse Practitioners) who all work together to provide you with the care you need, when you need it.  We recommend signing up for the patient portal called "MyChart".  Sign up information is provided on this After Visit Summary.  MyChart is used to connect with patients for Virtual Visits (Telemedicine).  Patients are able to view lab/test results, encounter notes, upcoming appointments, etc.  Non-urgent messages can be sent to your provider as well.   To learn more about what you can do with MyChart, go to [email or web address].    Your next appointment:   6 month(s)  Provider:   Peter Nishan, MD      

## 2022-04-19 ENCOUNTER — Other Ambulatory Visit: Payer: Self-pay | Admitting: Cardiovascular Disease

## 2022-05-02 ENCOUNTER — Inpatient Hospital Stay (HOSPITAL_COMMUNITY)
Admission: EM | Admit: 2022-05-02 | Discharge: 2022-05-12 | DRG: 233 | Disposition: A | Discharge status: Home / Self Care | Payer: 59 | Attending: Thoracic Surgery (Cardiothoracic Vascular Surgery) | Admitting: Thoracic Surgery (Cardiothoracic Vascular Surgery)

## 2022-05-02 ENCOUNTER — Emergency Department (HOSPITAL_COMMUNITY): Payer: 59

## 2022-05-02 ENCOUNTER — Inpatient Hospital Stay (HOSPITAL_COMMUNITY)
Admission: EM | Disposition: A | Discharge status: Home / Self Care | Payer: Self-pay | Source: Home / Self Care | Attending: Thoracic Surgery (Cardiothoracic Vascular Surgery)

## 2022-05-02 DIAGNOSIS — K219 Gastro-esophageal reflux disease without esophagitis: Secondary | ICD-10-CM | POA: Diagnosis present

## 2022-05-02 DIAGNOSIS — E119 Type 2 diabetes mellitus without complications: Secondary | ICD-10-CM

## 2022-05-02 DIAGNOSIS — I214 Non-ST elevation (NSTEMI) myocardial infarction: Secondary | ICD-10-CM | POA: Diagnosis present

## 2022-05-02 DIAGNOSIS — I5021 Acute systolic (congestive) heart failure: Secondary | ICD-10-CM | POA: Diagnosis not present

## 2022-05-02 DIAGNOSIS — I428 Other cardiomyopathies: Secondary | ICD-10-CM | POA: Diagnosis not present

## 2022-05-02 DIAGNOSIS — D62 Acute posthemorrhagic anemia: Secondary | ICD-10-CM | POA: Diagnosis not present

## 2022-05-02 DIAGNOSIS — N183 Chronic kidney disease, stage 3 unspecified: Secondary | ICD-10-CM | POA: Diagnosis present

## 2022-05-02 DIAGNOSIS — Z79899 Other long term (current) drug therapy: Secondary | ICD-10-CM

## 2022-05-02 DIAGNOSIS — I5043 Acute on chronic combined systolic (congestive) and diastolic (congestive) heart failure: Secondary | ICD-10-CM | POA: Diagnosis present

## 2022-05-02 DIAGNOSIS — Z9861 Coronary angioplasty status: Secondary | ICD-10-CM

## 2022-05-02 DIAGNOSIS — R0689 Other abnormalities of breathing: Secondary | ICD-10-CM | POA: Diagnosis not present

## 2022-05-02 DIAGNOSIS — I444 Left anterior fascicular block: Secondary | ICD-10-CM | POA: Diagnosis present

## 2022-05-02 DIAGNOSIS — E785 Hyperlipidemia, unspecified: Secondary | ICD-10-CM | POA: Diagnosis not present

## 2022-05-02 DIAGNOSIS — I25110 Atherosclerotic heart disease of native coronary artery with unstable angina pectoris: Secondary | ICD-10-CM | POA: Diagnosis present

## 2022-05-02 DIAGNOSIS — Z886 Allergy status to analgesic agent status: Secondary | ICD-10-CM | POA: Diagnosis not present

## 2022-05-02 DIAGNOSIS — E871 Hypo-osmolality and hyponatremia: Secondary | ICD-10-CM | POA: Diagnosis not present

## 2022-05-02 DIAGNOSIS — D6959 Other secondary thrombocytopenia: Secondary | ICD-10-CM | POA: Diagnosis present

## 2022-05-02 DIAGNOSIS — Z87891 Personal history of nicotine dependence: Secondary | ICD-10-CM | POA: Diagnosis not present

## 2022-05-02 DIAGNOSIS — R001 Bradycardia, unspecified: Secondary | ICD-10-CM | POA: Diagnosis present

## 2022-05-02 DIAGNOSIS — Z1152 Encounter for screening for COVID-19: Secondary | ICD-10-CM | POA: Diagnosis not present

## 2022-05-02 DIAGNOSIS — I253 Aneurysm of heart: Secondary | ICD-10-CM | POA: Diagnosis present

## 2022-05-02 DIAGNOSIS — Z888 Allergy status to other drugs, medicaments and biological substances status: Secondary | ICD-10-CM

## 2022-05-02 DIAGNOSIS — F419 Anxiety disorder, unspecified: Secondary | ICD-10-CM | POA: Diagnosis present

## 2022-05-02 DIAGNOSIS — Z7902 Long term (current) use of antithrombotics/antiplatelets: Secondary | ICD-10-CM | POA: Diagnosis not present

## 2022-05-02 DIAGNOSIS — Z8249 Family history of ischemic heart disease and other diseases of the circulatory system: Secondary | ICD-10-CM

## 2022-05-02 DIAGNOSIS — I130 Hypertensive heart and chronic kidney disease with heart failure and stage 1 through stage 4 chronic kidney disease, or unspecified chronic kidney disease: Secondary | ICD-10-CM | POA: Diagnosis present

## 2022-05-02 DIAGNOSIS — I252 Old myocardial infarction: Secondary | ICD-10-CM | POA: Diagnosis not present

## 2022-05-02 DIAGNOSIS — Z955 Presence of coronary angioplasty implant and graft: Secondary | ICD-10-CM | POA: Diagnosis not present

## 2022-05-02 DIAGNOSIS — Z01810 Encounter for preprocedural cardiovascular examination: Secondary | ICD-10-CM | POA: Diagnosis not present

## 2022-05-02 DIAGNOSIS — Z7984 Long term (current) use of oral hypoglycemic drugs: Secondary | ICD-10-CM

## 2022-05-02 DIAGNOSIS — E1122 Type 2 diabetes mellitus with diabetic chronic kidney disease: Secondary | ICD-10-CM | POA: Diagnosis present

## 2022-05-02 DIAGNOSIS — N179 Acute kidney failure, unspecified: Secondary | ICD-10-CM | POA: Diagnosis not present

## 2022-05-02 DIAGNOSIS — E7800 Pure hypercholesterolemia, unspecified: Secondary | ICD-10-CM | POA: Diagnosis present

## 2022-05-02 DIAGNOSIS — Z794 Long term (current) use of insulin: Secondary | ICD-10-CM

## 2022-05-02 DIAGNOSIS — I255 Ischemic cardiomyopathy: Secondary | ICD-10-CM | POA: Diagnosis present

## 2022-05-02 DIAGNOSIS — Z880 Allergy status to penicillin: Secondary | ICD-10-CM | POA: Diagnosis not present

## 2022-05-02 DIAGNOSIS — I2510 Atherosclerotic heart disease of native coronary artery without angina pectoris: Secondary | ICD-10-CM | POA: Diagnosis not present

## 2022-05-02 DIAGNOSIS — I5022 Chronic systolic (congestive) heart failure: Secondary | ICD-10-CM | POA: Insufficient documentation

## 2022-05-02 DIAGNOSIS — R0989 Other specified symptoms and signs involving the circulatory and respiratory systems: Secondary | ICD-10-CM | POA: Diagnosis present

## 2022-05-02 DIAGNOSIS — Z951 Presence of aortocoronary bypass graft: Secondary | ICD-10-CM | POA: Diagnosis not present

## 2022-05-02 DIAGNOSIS — Z8711 Personal history of peptic ulcer disease: Secondary | ICD-10-CM

## 2022-05-02 DIAGNOSIS — I10 Essential (primary) hypertension: Secondary | ICD-10-CM | POA: Diagnosis not present

## 2022-05-02 DIAGNOSIS — K5900 Constipation, unspecified: Secondary | ICD-10-CM | POA: Diagnosis not present

## 2022-05-02 HISTORY — PX: LEFT HEART CATH AND CORONARY ANGIOGRAPHY: CATH118249

## 2022-05-02 LAB — TROPONIN I (HIGH SENSITIVITY)
Troponin I (High Sensitivity): 214 ng/L (ref <18)
Troponin I (High Sensitivity): 5099 ng/L (ref <18)
Troponin I (High Sensitivity): 8341 ng/L (ref <18)

## 2022-05-02 LAB — CBC WITH DIFFERENTIAL/PLATELET
Abs Immature Granulocytes: 0.01 10*3/uL (ref 0.00–0.07)
Basophils Absolute: 0 10*3/uL (ref 0.0–0.1)
Basophils Relative: 0 %
Eosinophils Absolute: 0.2 10*3/uL (ref 0.0–0.5)
Eosinophils Relative: 2 %
HCT: 39.2 % (ref 39.0–52.0)
Hemoglobin: 12.9 g/dL — ABNORMAL LOW (ref 13.0–17.0)
Immature Granulocytes: 0 %
Lymphocytes Relative: 22 %
Lymphs Abs: 1.6 10*3/uL (ref 0.7–4.0)
MCH: 31.2 pg (ref 26.0–34.0)
MCHC: 32.9 g/dL (ref 30.0–36.0)
MCV: 94.7 fL (ref 80.0–100.0)
Monocytes Absolute: 0.4 10*3/uL (ref 0.1–1.0)
Monocytes Relative: 6 %
Neutro Abs: 4.9 10*3/uL (ref 1.7–7.7)
Neutrophils Relative %: 70 %
Platelets: 243 10*3/uL (ref 150–400)
RBC: 4.14 MIL/uL — ABNORMAL LOW (ref 4.22–5.81)
RDW: 15.1 % (ref 11.5–15.5)
WBC: 7.1 10*3/uL (ref 4.0–10.5)
nRBC: 0 % (ref 0.0–0.2)

## 2022-05-02 LAB — COMPREHENSIVE METABOLIC PANEL
ALT: 43 U/L (ref 0–44)
AST: 39 U/L (ref 15–41)
Albumin: 3.6 g/dL (ref 3.5–5.0)
Alkaline Phosphatase: 40 U/L (ref 38–126)
Anion gap: 14 (ref 5–15)
BUN: 17 mg/dL (ref 8–23)
CO2: 19 mmol/L — ABNORMAL LOW (ref 22–32)
Calcium: 9.1 mg/dL (ref 8.9–10.3)
Chloride: 105 mmol/L (ref 98–111)
Creatinine, Ser: 1.2 mg/dL (ref 0.61–1.24)
GFR, Estimated: >60 mL/min (ref >60)
Glucose, Bld: 129 mg/dL — ABNORMAL HIGH (ref 70–99)
Potassium: 4.3 mmol/L (ref 3.5–5.1)
Sodium: 138 mmol/L (ref 135–145)
Total Bilirubin: 0.8 mg/dL (ref 0.3–1.2)
Total Protein: 6.3 g/dL — ABNORMAL LOW (ref 6.5–8.1)

## 2022-05-02 SURGERY — LEFT HEART CATH AND CORONARY ANGIOGRAPHY
Anesthesia: LOCAL

## 2022-05-02 MED ORDER — VERAPAMIL HCL 2.5 MG/ML IV SOLN
INTRAVENOUS | Status: AC
  Filled 2022-05-02: qty 2

## 2022-05-02 MED ORDER — VERAPAMIL HCL 2.5 MG/ML IV SOLN
INTRAVENOUS | Status: DC | PRN
  Administered 2022-05-02: 10 mL via INTRA_ARTERIAL

## 2022-05-02 MED ORDER — MIDAZOLAM HCL 2 MG/2ML IJ SOLN
INTRAMUSCULAR | Status: DC | PRN
  Administered 2022-05-02: 1 mg via INTRAVENOUS

## 2022-05-02 MED ORDER — TIROFIBAN HCL IN NACL 5-0.9 MG/100ML-% IV SOLN
INTRAVENOUS | Status: AC
  Filled 2022-05-02: qty 100

## 2022-05-02 MED ORDER — LIDOCAINE HCL (PF) 1 % IJ SOLN
INTRAMUSCULAR | Status: AC
  Filled 2022-05-02: qty 30

## 2022-05-02 MED ORDER — FENTANYL CITRATE (PF) 100 MCG/2ML IJ SOLN
INTRAMUSCULAR | Status: DC | PRN
  Administered 2022-05-02: 25 ug via INTRAVENOUS

## 2022-05-02 MED ORDER — HEPARIN (PORCINE) 25000 UT/250ML-% IV SOLN
1000.0000 [IU]/h | INTRAVENOUS | Status: DC
  Administered 2022-05-02: 1000 [IU]/h via INTRAVENOUS
  Filled 2022-05-02: qty 250

## 2022-05-02 MED ORDER — MIDAZOLAM HCL 2 MG/2ML IJ SOLN
INTRAMUSCULAR | Status: AC
  Filled 2022-05-02: qty 2

## 2022-05-02 MED ORDER — ACETAMINOPHEN 325 MG PO TABS: 650.0000 mg | ORAL_TABLET | ORAL | Status: DC | PRN

## 2022-05-02 MED ORDER — ONDANSETRON HCL 4 MG/2ML IJ SOLN: 4.0000 mg | Freq: Four times a day (QID) | INTRAMUSCULAR | Status: DC | PRN

## 2022-05-02 MED ORDER — FENTANYL CITRATE (PF) 100 MCG/2ML IJ SOLN
INTRAMUSCULAR | Status: AC
  Filled 2022-05-02: qty 2

## 2022-05-02 MED ORDER — HEPARIN SODIUM (PORCINE) 1000 UNIT/ML IJ SOLN
INTRAMUSCULAR | Status: AC
  Filled 2022-05-02: qty 10

## 2022-05-02 MED ORDER — NITROGLYCERIN IN D5W 200-5 MCG/ML-% IV SOLN
0.0000 ug/min | INTRAVENOUS | Status: DC
  Administered 2022-05-02: 7.5 ug/min via INTRAVENOUS
  Administered 2022-05-02: 5 ug/min via INTRAVENOUS
  Administered 2022-05-03: 7.5 ug/min via INTRAVENOUS
  Filled 2022-05-02 (×2): qty 250

## 2022-05-02 MED ORDER — TIROFIBAN HCL IN NACL 5-0.9 MG/100ML-% IV SOLN
INTRAVENOUS | Status: AC | PRN
  Administered 2022-05-02: .15 ug/kg/min via INTRAVENOUS

## 2022-05-02 MED ORDER — HEPARIN BOLUS VIA INFUSION
4000.0000 [IU] | Freq: Once | INTRAVENOUS | Status: AC
  Administered 2022-05-02: 4000 [IU] via INTRAVENOUS
  Filled 2022-05-02: qty 4000

## 2022-05-02 MED ORDER — NITROGLYCERIN 1 MG/10 ML FOR IR/CATH LAB
INTRA_ARTERIAL | Status: AC
  Filled 2022-05-02: qty 10

## 2022-05-02 MED ORDER — CARVEDILOL 3.125 MG PO TABS
3.1250 mg | ORAL_TABLET | Freq: Two times a day (BID) | ORAL | Status: DC
  Administered 2022-05-03 – 2022-05-07 (×9): 3.125 mg via ORAL
  Filled 2022-05-02 (×9): qty 1

## 2022-05-02 MED ORDER — HEPARIN (PORCINE) IN NACL 1000-0.9 UT/500ML-% IV SOLN
INTRAVENOUS | Status: DC | PRN
  Administered 2022-05-02 (×2): 500 mL

## 2022-05-02 MED ORDER — IOHEXOL 350 MG/ML SOLN
INTRAVENOUS | Status: DC | PRN
  Administered 2022-05-02: 70 mL via INTRA_ARTERIAL

## 2022-05-02 MED ORDER — NITROGLYCERIN 2 % TD OINT
0.5000 [in_us] | TOPICAL_OINTMENT | Freq: Once | TRANSDERMAL | Status: AC
  Administered 2022-05-02: 0.5 [in_us] via TOPICAL
  Filled 2022-05-02: qty 1

## 2022-05-02 MED ORDER — LIDOCAINE HCL (PF) 1 % IJ SOLN
INTRAMUSCULAR | Status: DC | PRN
  Administered 2022-05-02: 2 mL

## 2022-05-02 MED ORDER — CLOPIDOGREL BISULFATE 75 MG PO TABS: 75.0000 mg | ORAL_TABLET | Freq: Every day | ORAL | Status: DC

## 2022-05-02 MED ORDER — HEPARIN SODIUM (PORCINE) 1000 UNIT/ML IJ SOLN
INTRAMUSCULAR | Status: DC | PRN
  Administered 2022-05-02: 4000 [IU] via INTRAVENOUS

## 2022-05-02 SURGICAL SUPPLY — 10 items
CATH OPTITORQUE TIG 4.0 5F (CATHETERS) IMPLANT
DEVICE RAD COMP TR BAND LRG (VASCULAR PRODUCTS) IMPLANT
GLIDESHEATH SLEND SS 6F .021 (SHEATH) IMPLANT
GUIDEWIRE INQWIRE 1.5J.035X260 (WIRE) IMPLANT
INQWIRE 1.5J .035X260CM (WIRE) ×1
KIT HEART LEFT (KITS) ×1 IMPLANT
PACK CARDIAC CATHETERIZATION (CUSTOM PROCEDURE TRAY) ×1 IMPLANT
SHEATH PROBE COVER 6X72 (BAG) IMPLANT
TRANSDUCER W/STOPCOCK (MISCELLANEOUS) ×1 IMPLANT
TUBING CIL FLEX 10 FLL-RA (TUBING) ×1 IMPLANT

## 2022-05-02 NOTE — Assessment & Plan Note (Addendum)
-  continue Coreg -holding Entresto until [employer]

## 2022-05-02 NOTE — ED Notes (Signed)
Pt to xray

## 2022-05-02 NOTE — ED Triage Notes (Signed)
PT BIB EMS from home for chest pressure after doing yard work. Pt denies SOB. Pain is 8/10. Pt took x2 nitro at home with minimal relief and x1 EMS with more relief. PT reports chewing nitro tabs at home. HX multiple stents. EMS placed 20 L AC.  EMS VS 126/72 BG158 HR 74 16 RR 98% RA

## 2022-05-02 NOTE — Interval H&P Note (Signed)
History and Physical Interval Note:  05/02/2022 10:37 PM  [name]  has presented today for surgery, with the diagnosis of Non-STEMI.    Patient has known history of CAD with PCI to the LAD and RCA in the past.  Last PCI was in 2019.  Done well but presented now with an ACS presentation and troponins that continue to increase.    Donato Heinz, MD was Called to bedside by ED, patient having ongoing chest pain.  EKG unchanged. Troponins 214>5099.  Currently with nitropaste.  Reports 3-4/10 chest pain, states that has improved with nitropaste but not resolved.  BP 119/65.  Will switch to nitro gtt and titrate as BP tolerates.  If unable to make chest pain free, would plan for LHC tonight.  Discussed with interventional cardiologist, Dr Ellyn Hack, agrees with trial of nitro gtt with plans for cath if ongoing chest pain.  Discussed with Dr Humphrey Rolls who is covering cardiology service tonight and will check in on patient to see if chest pain free on nitro gtt.   His initial chest pain was 8 out of 10 currently down to 2-3 out of 10 however on high-dose nitroglycerin but is also showing signs of more pronounced bradycardia.  Due to concern for ongoing ischemia with persistent chest pain he is being brought to the Cath Lab for urgent non-STEMI.  The various methods of treatment have been discussed with the patient and family. After consideration of risks, benefits and other options for treatment, the patient has consented to  Procedure(s): Coronary/Graft Acute MI Revascularization (N/A) LEFT HEART CATH AND CORONARY ANGIOGRAPHY (N/A)  PERCUTANEOUS CORONARY INTERVENTION  as a surgical intervention.  The patient's history has been reviewed, patient examined, no change in status, stable for surgery.  I have reviewed the patient's chart and labs.  Questions were answered to the patient's satisfaction.    Cath Lab Visit (complete for each Cath Lab visit)  Clinical Evaluation Leading to the Procedure:    ACS: Yes.    Non-ACS:    Anginal Classification: CCS IV  Anti-ischemic medical therapy: Maximal Therapy (2 or more classes of medications)  Non-Invasive Test Results: No non-invasive testing performed  Prior CABG: No previous CABG    Glenetta Hew

## 2022-05-02 NOTE — Assessment & Plan Note (Signed)
-  intolerant to statin; cardiology recommends discussion of Nexletol outpatient

## 2022-05-02 NOTE — Progress Notes (Signed)
ANTICOAGULATION CONSULT NOTE - Initial Consult  Pharmacy Consult for Heparin Indication: chest pain/ACS  Allergies  Allergen Reactions   Asa [Aspirin] Other (See Comments)    Abdominal bleeding   Penicillins Anaphylaxis    Has patient had a PCN reaction causing immediate rash, facial/tongue/throat swelling, SOB or lightheadedness with hypotension: Yes Has patient had a PCN reaction causing severe rash involving mucus membranes or skin necrosis: No Has patient had a PCN reaction that required hospitalization: No Has patient had a PCN reaction occurring within the last 10 years: No If all of the above answers are "NO", then may proceed with Cephalosporin use.   Statins Other (See Comments)    Stiff joints, muscle tightness, couldn't walk    Patient Measurements: Height: '5\' 7"'$  (170.2 cm) Weight: 83.5 kg (184 lb) IBW/kg (Calculated) : 66.1 Heparin Dosing Weight: 82.9 kg  Vital Signs: Temp: 98.1 F (36.7 C) (03/08 1433) BP: 130/66 (03/08 1515) Pulse Rate: 25 (03/08 1515)  Labs: Recent Labs    05/02/22 1514  HGB 12.9*  HCT 39.2  PLT 243  CREATININE 1.20  TROPONINIHS 214*    Estimated Creatinine Clearance: 54.1 mL/min (by C-G formula based on SCr of 1.2 mg/dL).   Medical History: Past Medical History:  Diagnosis Date   Anxiety    Arthritis    "wrists" (07/27/2014)   Coronary artery disease    a.  Pt with 4 stents over the course of 2003-2012;  b.  LHC 6/16:  LAD, RCA, LCx stents patent, OM1 50%, EF 50-55% >> med rx    Elevated LFTs    GERD (gastroesophageal reflux disease)    Hypercholesterolemia    Hypertension    Ischemic cardiomyopathy    cMRI 03/2011:  EF 48% with dist ant, septal, apical and inf-apical dyskinesia, no apical clot, dist ant, apical and septal full thickness scar >> No ICD needed  //  Limited echo 7/19: Large apical septal aneurysm, severe hypokinesis of the entire apex and mid to apical segments of the anterior wall, anterolateral wall and anterior  septum-consistent with infarct and most of the LAD territory; EF 35    Left ventricular aneurysm    Limited echo 7/19: Large apical septal aneurysm, severe hypokinesis of the entire apex and mid to apical segments of the anterior wall, anterolateral wall and anterior septum-consistent with infarct and most of the LAD territory; EF 35   Myocardial infarction ([place]) 2003    Medications:  (Not in a hospital admission)  Scheduled:  Infusions:  PRN:   Assessment: 85 yom with a history of CKD, HLD, HTN, CAD s/p PCI, GIB, HF, GERD, IBS. Patient is presenting with chest pain. Heparin per pharmacy consult placed for chest pain/ACS.  Patient is not on anticoagulation prior to arrival.  Hgb 12.9; plt 243  Goal of Therapy:  Heparin level 0.3-0.7 units/ml Monitor platelets by anticoagulation protocol: Yes   Plan:  Give IV heparin 4000 units bolus x 1 Start heparin infusion at 1000 units/hr Check anti-Xa level in 8 hours and daily while on heparin Continue to monitor H&H and platelets  Lorelei Pont, PharmD, BCPS 05/02/2022 4:53 PM ED Clinical Pharmacist -  [phone]

## 2022-05-02 NOTE — Assessment & Plan Note (Addendum)
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 Harl Bowie with cardiology who recommended keeping on IV heparin with plans for [facility] on Monday. However second troponin elevated to 5099 with ongoing chest pain. Cardiology Dr. Gardiner Rhyme re-evaluated at bedside and have started IV nitroglycerin infusion with goal of being chest pain free. They will continue to evaluate tonight. If pain persist, Dr. Ellyn Hack with interventional cardiology has been notified, and will take for cath overnight.  -continue Plavix daily, has intolerant to asprin -intolerant to statin; cardiology recommends discussion of Nexletol outpatient

## 2022-05-02 NOTE — ED Provider Notes (Signed)
[facility] Provider Note   CSN: [id number] Arrival date & time: 05/02/22  1404     History {Add pertinent medical, surgical, social history, OB history to HPI:1} Chief Complaint  Patient presents with   Chest Pain    [name] is a 77 y.o. male with CKD stage 3, HLD, HTN, ICM, CAD s/p multiple PCIs, h/o GIB, chronic systolic CHF, GERD, IBS who presents with CP.   PT BIB EMS from home for chest pressure after doing yard work. Pt denies SOB. Pain is 8/10. Pt took x2 nitro at home with minimal relief and x1 EMS with more relief. PT reports chewing nitro tabs at home. HX multiple stents.   Over last several days, has had pressure in his chest. Got worse today while doing yardwork. Located central chest moved to left upper chest. Currently pain is minimal but still feels some mild pressure, 4/10. Earlier it was 8/10 prior to nitroglycerin. Endorses some mild SOB and diaphoresis with it, no nausea/vomiting. Denies recent illnesses, f/c, cough. Has had some abdominal discomfort due to constipation. Had a bowel movement this morning he strained. Reports dehydration recently as well, had to rehydrate this morning. Denies leg swelling or pain. Takes plavix, no blood thinners.  Recent visit with cards on 03/14/22 had no angina, EF 35%.    Chest Pain      Home Medications Prior to Admission medications   Medication Sig Start Date End Date Taking? Authorizing Provider  carvedilol (COREG) 3.125 MG tablet TAKE 1 TABLET BY MOUTH TWICE A DAY WITH FOOD 04/21/22   Josue Hector, MD  clopidogrel (PLAVIX) 75 MG tablet Take 1 tablet (75 mg total) by mouth daily. 06/11/12   Arguello, Roger A, PA-C  dicyclomine (BENTYL) 20 MG tablet Take 20 mg by mouth 2 (two) times daily after a meal.     [provider]  nitroGLYCERIN (NITROSTAT) 0.4 MG SL tablet PLACE 1 TABLET (0.4 MG TOTAL) UNDER THE TONGUE EVERY 5 (FIVE) MINUTES AS NEEDED FOR CHEST PAIN. FOR A  MAX OF 3 DOSES. IF NO RELIEF, CALL 911. 06/03/21   Josue Hector, MD  omega-3 acid ethyl esters (LOVAZA) 1 G capsule Take 1 g by mouth daily.    [provider]  pantoprazole (PROTONIX) 40 MG tablet Take 1 tablet (40 mg total) by mouth daily. 04/26/12   Josue Hector, MD  sacubitril-valsartan (ENTRESTO) 97-103 MG Take 1 tablet by mouth 2 (two) times daily. 03/04/22   Josue Hector, MD  zolpidem (AMBIEN) 10 MG tablet Take 1 tablet (10 mg total) by mouth at bedtime as needed. For sleep 07/06/12   Josue Hector, MD      Allergies    Asa [aspirin], Penicillins, and Statins    Review of Systems   Review of Systems  Cardiovascular:  Positive for chest pain.   Review of systems {pos/neg:18640::"Negative","Positive"} for ***.  A 10 point review of systems was performed and is negative unless otherwise reported in HPI.  Physical Exam Updated Vital Signs Ht '5\' 7"'$  (1.702 m)   Wt 83.5 kg   SpO2 100%   BMI 28.82 kg/m  Physical Exam General: Normal appearing {Desc; male/male:11659}, lying in bed.  HEENT: PERRLA, Sclera anicteric, MMM, trachea midline.  Cardiology: RRR, no murmurs/rubs/gallops. BL radial and DP pulses equal bilaterally.  Resp: Normal respiratory rate and effort. CTAB, no wheezes, rhonchi, crackles.  Abd: Soft, non-tender, non-distended. No rebound tenderness or guarding.  GU: Deferred.  MSK: No peripheral edema or signs of trauma. Extremities without deformity or TTP. No cyanosis or clubbing. Skin: warm, dry. No rashes or lesions. Back: No CVA tenderness Neuro: A&Ox4, CNs II-XII grossly intact. MAEs. Sensation grossly intact.  Psych: Normal mood and affect.   ED Results / Procedures / Treatments   Labs (all labs ordered are listed, but only abnormal results are displayed) Labs Reviewed - No data to display  EKG EKG Interpretation  Date/Time:  Friday May 02 2022 14:32:13 EST Ventricular Rate:  55 PR Interval:  174 QRS Duration: 107 QT Interval:  361 QTC  Calculation: 346 R Axis:   -68 Text Interpretation: Sinus rhythm Left anterior fascicular block Confirmed by Cindee Lame [phone]) on 05/02/2022 2:33:08 PM  Radiology No results found.  Procedures Procedures  {Document cardiac monitor, telemetry assessment procedure when appropriate:1}  Medications Ordered in ED Medications - No data to display  ED Course/ Medical Decision Making/ A&P                          Medical Decision Making Amount and/or Complexity of Data Reviewed Labs: ordered. Radiology: ordered.    This patient presents to the ED for concern of ***, this involves an extensive number of treatment options, and is a complaint that carries with it a high risk of complications and morbidity.  I considered the following differential and admission for this acute, potentially life threatening condition.   MDM:    ***     Labs: I Ordered, and personally interpreted labs.  The pertinent results include:  ***  Imaging Studies ordered: I ordered imaging studies including *** I independently visualized and interpreted imaging. I agree with the radiologist interpretation  Additional history obtained from ***.  External records from outside source obtained and reviewed including ***  Cardiac Monitoring: The patient was maintained on a cardiac monitor.  I personally viewed and interpreted the cardiac monitored which showed an underlying rhythm of: ***  Reevaluation: After the interventions noted above, I reevaluated the patient and found that they have :{resolved/improved/worsened:23923::"improved"}  Social Determinants of Health: ***  Disposition:  ***  Co morbidities that complicate the patient evaluation  Past Medical History:  Diagnosis Date   Anxiety    Arthritis    "wrists" (07/27/2014)   Coronary artery disease    a.  Pt with 4 stents over the course of 2003-2012;  b.  LHC 6/16:  LAD, RCA, LCx stents patent, OM1 50%, EF 50-55% >> med rx    Elevated LFTs     GERD (gastroesophageal reflux disease)    Hypercholesterolemia    Hypertension    Ischemic cardiomyopathy    cMRI 03/2011:  EF 48% with dist ant, septal, apical and inf-apical dyskinesia, no apical clot, dist ant, apical and septal full thickness scar >> No ICD needed  //  Limited echo 7/19: Large apical septal aneurysm, severe hypokinesis of the entire apex and mid to apical segments of the anterior wall, anterolateral wall and anterior septum-consistent with infarct and most of the LAD territory; EF 35    Left ventricular aneurysm    Limited echo 7/19: Large apical septal aneurysm, severe hypokinesis of the entire apex and mid to apical segments of the anterior wall, anterolateral wall and anterior septum-consistent with infarct and most of the LAD territory; EF 35   Myocardial infarction ([place]) 2003     Medicines No orders of the defined types were placed in this encounter.  I have reviewed the patients home medicines and have made adjustments as needed  Problem List / ED Course: Problem List Items Addressed This Visit   None        {Document critical care time when appropriate:1} {Document review of labs and clinical decision tools ie heart score, Chads2Vasc2 etc:1}  {Document your independent review of radiology images, and any outside records:1} {Document your discussion with family members, caretakers, and with consultants:1} {Document social determinants of health affecting pt's care:1} {Document your decision making why or why not admission, treatments were needed:1}  This note was created using dictation software, which may contain spelling or grammatical errors.

## 2022-05-02 NOTE — H&P (View-Only) (Signed)
Cardiology Consultation   Patient ID: [name] MRN: [id number]; DOB: March 31, 1945  Admit date: 05/02/2022 Date of Consult: 05/02/2022  PCP:  Velna Hatchet, [facility] Cardiologist:  Jenkins Rouge, MD        Patient Profile:   [name] is a 77 y.o. male with a hx of CAD and ischemic DCM.  Anterior MI with DES to LAD and circumflex in 2012 in  [place]. 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 Unstable angina August 2019 cath with Dr Burt Knack. 10/20/17 DES to mid LAD for instent  Restenosis. Other arteries ok except small OM1 not suitable for intervention, hx of GI bleed 04/2017 with duodenal erosions s/p clipping  who is being seen 05/02/2022 for the evaluation of chest pain with exertion at the request of Dr. Dina Rich.  History of Present Illness:   Mr. Kleckner is a known patient to Dr. Johnsie Cancel. He has had prior CAD per above, with most recent [facility] 09/2017, s/p DES to mid LAD for instent restenosis. MRI that November showed LAD territory scar. He has an apical aneurysm. He was seen by Dr. Johnsie Cancel in January. Noted his GI bleed hx 2/2 duodenal erosions s/p clipping. He is notably intolerant to aspirin. He was referred to lipid clinic, patient declined PCSK9, considering nexletol.   He presents here with CP. He notes that today he was working in his yard and he felt L sided chest pressure. It states it was 6-7/10. He cannot recall if this was similar to his prior PCI presentations. He was concerned and called for EMS.  He received nitro on arrival. His chest pain resolved. His vitals were normal. In sinus rhythm. He is on room air. He had troponin at 214. EKG shows known lateral infarct.  His renal function is normal. His hgb is 12.9.   Past Medical History:  Diagnosis Date   Anxiety    Arthritis    "wrists" (07/27/2014)   Coronary artery disease    a.  Pt with 4 stents over the course of 2003-2012;  b.  LHC 6/16:  LAD, RCA, LCx stents patent, OM1 50%, EF  50-55% >> med rx    Elevated LFTs    GERD (gastroesophageal reflux disease)    Hypercholesterolemia    Hypertension    Ischemic cardiomyopathy    cMRI 03/2011:  EF 48% with dist ant, septal, apical and inf-apical dyskinesia, no apical clot, dist ant, apical and septal full thickness scar >> No ICD needed  //  Limited echo 7/19: Large apical septal aneurysm, severe hypokinesis of the entire apex and mid to apical segments of the anterior wall, anterolateral wall and anterior septum-consistent with infarct and most of the LAD territory; EF 35    Left ventricular aneurysm    Limited echo 7/19: Large apical septal aneurysm, severe hypokinesis of the entire apex and mid to apical segments of the anterior wall, anterolateral wall and anterior septum-consistent with infarct and most of the LAD territory; EF 35   Myocardial infarction ([place]) 2003    Past Surgical History:  Procedure Laterality Date   CARDIAC CATHETERIZATION  06/11/2012   Nonobstructive CAD, patent stents, EF 50%, mild apical dyskinesis   CARDIAC CATHETERIZATION N/A 07/28/2014   Procedure: Left Heart Cath and Coronary Angiography;  Surgeon: Troy Sine, MD;  Location: [place] [facility];  Service: Cardiovascular;  Laterality: N/A;   CORONARY ANGIOPLASTY WITH STENT PLACEMENT  2003-2012    total of 4 stents place  CORONARY STENT INTERVENTION N/A 10/20/2017   Procedure: CORONARY STENT INTERVENTION;  Surgeon: Sherren Mocha, MD;  Location: [place] [facility];  Service: Cardiovascular;  Laterality: N/A;   COSMETIC SURGERY Left 1970's   "dog ripped of my ear"   LEFT HEART CATH AND CORONARY ANGIOGRAPHY N/A 10/20/2017   Procedure: LEFT HEART CATH AND CORONARY ANGIOGRAPHY;  Surgeon: Sherren Mocha, MD;  Location: [place] [facility];  Service: Cardiovascular;  Laterality: N/A;   LEFT HEART CATHETERIZATION WITH CORONARY ANGIOGRAM N/A 06/11/2012   Procedure: LEFT HEART CATHETERIZATION WITH CORONARY ANGIOGRAM;  Surgeon: Peter M Martinique, MD;   Location: [facility] CATH LAB;  Service: Cardiovascular;  Laterality: N/A;     Home Medications:  Prior to Admission medications   Medication Sig Start Date End Date Taking? Authorizing Provider  carvedilol (COREG) 3.125 MG tablet TAKE 1 TABLET BY MOUTH TWICE A DAY WITH FOOD 04/21/22   Josue Hector, MD  clopidogrel (PLAVIX) 75 MG tablet Take 1 tablet (75 mg total) by mouth daily. 06/11/12   Arguello, Roger A, PA-C  dicyclomine (BENTYL) 20 MG tablet Take 20 mg by mouth 2 (two) times daily after a meal.     [provider]  nitroGLYCERIN (NITROSTAT) 0.4 MG SL tablet PLACE 1 TABLET (0.4 MG TOTAL) UNDER THE TONGUE EVERY 5 (FIVE) MINUTES AS NEEDED FOR CHEST PAIN. FOR A MAX OF 3 DOSES. IF NO RELIEF, CALL 911. 06/03/21   Josue Hector, MD  omega-3 acid ethyl esters (LOVAZA) 1 G capsule Take 1 g by mouth daily.    [provider]  pantoprazole (PROTONIX) 40 MG tablet Take 1 tablet (40 mg total) by mouth daily. 04/26/12   Josue Hector, MD  sacubitril-valsartan (ENTRESTO) 97-103 MG Take 1 tablet by mouth 2 (two) times daily. 03/04/22   Josue Hector, MD  zolpidem (AMBIEN) 10 MG tablet Take 1 tablet (10 mg total) by mouth at bedtime as needed. For sleep 07/06/12   Josue Hector, MD    Inpatient Medications: Scheduled Meds:   Continuous Infusions:  heparin 1,000 Units/hr (05/02/22 1813)   PRN Meds:   Allergies:    Allergies  Allergen Reactions   Asa [Aspirin] Other (See Comments)    Abdominal bleeding   Penicillins Anaphylaxis    Has patient had a PCN reaction causing immediate rash, facial/tongue/throat swelling, SOB or lightheadedness with hypotension: Yes Has patient had a PCN reaction causing severe rash involving mucus membranes or skin necrosis: No Has patient had a PCN reaction that required hospitalization: No Has patient had a PCN reaction occurring within the last 10 years: No If all of the above answers are "NO", then may proceed with Cephalosporin use.   Statins  Other (See Comments)    Stiff joints, muscle tightness, couldn't walk    Social History:   Social History   Socioeconomic History   Marital status: Divorced    Spouse name: Not on file   Number of children: Not on file   Years of education: Not on file   Highest education level: Not on file  Occupational History   Occupation: retired  Tobacco Use   Smoking status: Former    Packs/day: 1.00    Years: 15.00    Total pack years: 15.00    Types: Cigarettes   Smokeless tobacco: Never   Tobacco comments:    "quit smoking cigarettes in 1979"  Vaping Use   Vaping Use: Never used  Substance and Sexual Activity   Alcohol use: Not Currently  Alcohol/week: 2.0 standard drinks of alcohol    Types: 2 Glasses of wine per week   Drug use: No   Sexual activity: Not Currently  Other Topics Concern   Not on file  Social History Narrative   Not on file   Social Determinants of Health   Financial Resource Strain: Not on file  Food Insecurity: Not on file  Transportation Needs: Not on file  Physical Activity: Not on file  Stress: Not on file  Social Connections: Not on file  Intimate Partner Violence: Not on file    Family History:    Family History  Problem Relation Age of Onset   Coronary artery disease Mother    Coronary artery disease Father      ROS:  Please see the history of present illness.   All other ROS reviewed and negative.     Physical Exam/Data:   Vitals:   05/02/22 1500 05/02/22 1515 05/02/22 1715 05/02/22 1830  BP: 105/63 130/66 133/69 (!) 143/84  Pulse: (!) 51 (!) 25 (!) 54 76  Resp: '13 14 10 12  '$ Temp:    98 F (36.7 C)  SpO2: 99% 96% 100% 100%  Weight:      Height:       No intake or output data in the 24 hours ending 05/02/22 1851    05/02/2022    2:19 PM 03/14/2022    9:46 AM 01/28/2021    9:43 AM  Last 3 Weights  Weight (lbs) 184 lb 193 lb 3.2 oz 185 lb  Weight (kg) 83.462 kg 87.635 kg 83.915 kg     Body mass index is 28.82 kg/m.   General:  Well nourished, well developed, in no acute distress HEENT: normal Neck: no JVD Vascular: No carotid bruits; Distal pulses 2+ bilaterally Cardiac:  normal S1, S2; RRR; no murmur  Lungs:  clear to auscultation bilaterally, no wheezing, rhonchi or rales  Abd: soft, nontender, no hepatomegaly  Ext: no edema Musculoskeletal:  No deformities, BUE and BLE strength normal and equal Skin: warm and dry  Neuro:  CNs 2-12 intact, no focal abnormalities noted Psych:  Normal affect   EKG:  The EKG was personally reviewed and demonstrates:  NSR, lateral infarct old, LAFB  Telemetry:  Telemetry was personally reviewed and demonstrates:  NSR  Relevant CV Studies: LHC 10/20/2017 Ost 1st Mrg to 1st Mrg lesion is 75% stenosed. Non-stenotic Ost RCA to Mid RCA lesion was previously treated. Mid RCA to Dist RCA lesion is 40% stenosed. Prox Cx to Mid Cx lesion is 30% stenosed. Prox LAD to Mid LAD lesion is 80% stenosed. A drug-eluting stent was successfully placed using a STENT RESOLUTE ONYX 2.5X38. Post intervention, there is a 0% residual stenosis.   1.  Multivessel coronary artery disease with continued patency of the stented segment in the RCA, continued patency of the stented segment in the left circumflex with mild in-stent restenosis and severe stenosis of a small obtuse marginal Lanelle Lindo not suitable for PCI, and severe diffuse stenosis of the proximal and mid LAD treated successfully with overlapping resolute Onyx drug-eluting stents 2.  Known severe LV systolic dysfunction  [id number] IMPRESSION: 1. Mild LVE with thinned and akinetic distal septum, anterior wall apex and inferior apex EF 46%   2. Full thickness scar involving distal septum, anterior wall, apex and inferior apex   3. Tri- leaflet AV with normal aortic root 3.3 cm moderate appearing central AR   4.  Normal RV size and function  5.  Trivial pericardial effusion   6.  Mild LAE  TTE 11/29/2019 1. No thrombus in  the apical aneurysm. Left ventricular ejection  fraction, by estimation, is 40 to 45%. The left ventricle has mildly to  moderately decreased function. The left ventricle demonstrates regional  wall motion abnormalities (see scoring  diagram/findings for description). Left ventricular diastolic parameters  are consistent with Grade I diastolic dysfunction (impaired relaxation).  Elevated left atrial pressure.   2. Right ventricular systolic function is normal. The right ventricular  size is normal. There is mildly elevated pulmonary artery systolic  pressure. The estimated right ventricular systolic pressure is 0000000 mmHg.   3. Left atrial size was mildly dilated.   4. The mitral valve is normal in structure. Mild mitral valve  regurgitation. No evidence of mitral stenosis.   5. Tricuspid valve regurgitation is severe.   6. The aortic valve is normal in structure. Aortic valve regurgitation is  mild. No aortic stenosis is present.   7. Aortic dilatation noted. There is mild dilatation of the ascending  aorta, measuring 42 mm.   8. The inferior vena cava is normal in size with greater than 50%  respiratory variability, suggesting right atrial pressure of 3 mmHg.   TTE 01/20/2022  1. Apical aneurysm present. Patient refused definity contrast so cannot  evaluate for apical thrombus.. Left ventricular ejection fraction, by  estimation, is 45 to 50%. The left ventricle has mildly decreased  function. The left ventricle demonstrates  regional wall motion abnormalities (see scoring diagram/findings for  description). There is moderate concentric left ventricular hypertrophy.  Left ventricular diastolic parameters are consistent with Grade II  diastolic dysfunction (pseudonormalization).  Elevated left ventricular end-diastolic pressure. There is akinesis of the  left ventricular, apical apical segment, septal wall, inferior wall and  anterior wall.   2. Right ventricular systolic function is  normal. The right ventricular  size is normal. There is normal pulmonary artery systolic pressure. The  estimated right ventricular systolic pressure is [id number] mmHg.   3. Left atrial size was mildly dilated.   4. The mitral valve is normal in structure. Mild mitral valve  regurgitation. No evidence of mitral stenosis.   5. The aortic valve is tricuspid. Aortic valve regurgitation is mild by  PHT but the doppler sampling was not in the Ai jet. By colorflow the AI  appears at least moderate and may be severe. Aortic valve sclerosis is  present, with no evidence of aortic  valve stenosis. Aortic regurgitation PHT measures 817 msec.   6. Aortic dilatation noted. There is mild dilatation of the aortic root,  measuring 38 mm. There is mild dilatation of the ascending aorta,  measuring 40 mm.   7. The inferior vena cava is normal in size with greater than 50%  respiratory variability, suggesting right atrial pressure of 3 mmHg.   8. Recommend repeat limited study to evaluate AI further as well as use  definity contrast to rule out apical thrombus.    Laboratory Data:  High Sensitivity Troponin:   Recent Labs  Lab 05/02/22 1514  TROPONINIHS 214*     Chemistry Recent Labs  Lab 05/02/22 1514  NA 138  K 4.3  CL 105  CO2 19*  GLUCOSE 129*  BUN 17  CREATININE 1.20  CALCIUM 9.1  GFRNONAA >60  ANIONGAP 14    Recent Labs  Lab 05/02/22 1514  PROT 6.3*  ALBUMIN 3.6  AST 39  ALT 43  ALKPHOS 40  BILITOT 0.8  Lipids No results for input(s): "CHOL", "TRIG", "HDL", "LABVLDL", "LDLCALC", "CHOLHDL" in the last 168 hours.  Hematology Recent Labs  Lab 05/02/22 1514  WBC 7.1  RBC 4.14*  HGB 12.9*  HCT 39.2  MCV 94.7  MCH 31.2  MCHC 32.9  RDW 15.1  PLT 243   Thyroid No results for input(s): "TSH", "FREET4" in the last 168 hours.  BNPNo results for input(s): "BNP", "PROBNP" in the last 168 hours.  DDimer No results for input(s): "DDIMER" in the last 168  hours.   Radiology/Studies:  DG Chest 2 View  Result Date: 05/02/2022 CLINICAL DATA:  Chest pain. EXAM: CHEST - 2 VIEW COMPARISON:  August 08, 2019. FINDINGS: The heart size and mediastinal contours are within normal limits. Both lungs are clear. The visualized skeletal structures are unremarkable. IMPRESSION: No active cardiopulmonary disease. Electronically Signed   By: Marijo Conception M.D.   On: 05/02/2022 15:39     Assessment and Plan:   NSTEMI: he had CP with exertion and significant CAD hx. Agree this is concerning for nstemi. Hgb is in a good range. Normal crt. Good radial pulses - continue heparin gtt - he is intolerant to asa; continue plavix 75 mg daily - intolerant to statin; defer discussion of nexletol to outpatient - continue home coreg - nitro SL PRN - plan for LHC on Monday  Shared Decision Making/Informed Consent The risks [stroke (1 in 1000), death (1 in 1000), kidney failure [usually temporary] (1 in 500), bleeding (1 in 200), allergic reaction [possibly serious] (1 in 200)], benefits (diagnostic support and management of coronary artery disease) and alternatives of a cardiac catheterization were discussed in detail with Mr. Braun and he is willing to proceed.   2. Ischemic CM:  -he has an apical aneurysm and an LV thrombus could not be ruled out on his prior echo. Will get a limited TTE with contrast to definitively rule it out. - euvolemic - continue coreg - hold entresto until post cath   Risk Assessment/Risk Scores:     TIMI Risk Score for Unstable Angina or Non-ST Elevation MI:   The patient's TIMI risk score is 4, which indicates a 20% risk of all cause mortality, new or recurrent myocardial infarction or need for urgent revascularization in the next 14 days.   For questions or updates, please contact [facility] Please consult [email or web address] for contact info under    Signed, Janina Mayo, MD  05/02/2022 6:51 PM

## 2022-05-02 NOTE — Progress Notes (Signed)
Called to bedside by ED, patient having ongoing chest pain.  EKG unchanged. Troponins 214>5099.  Currently with nitropaste.  Reports 3-4/10 chest pain, states that has improved with nitropaste but not resolved.  BP 119/65.  Will switch to nitro gtt and titrate as BP tolerates.  If unable to make chest pain free, would plan for LHC tonight.  Discussed with interventional cardiologist, Dr Ellyn Hack, agrees with trial of nitro gtt with plans for cath if ongoing chest pain.  Discussed with Dr Humphrey Rolls who is covering cardiology service tonight and will check in on patient to see if chest pain free on nitro gtt.  Donato Heinz, MD

## 2022-05-02 NOTE — ED Provider Notes (Signed)
Patient's repeat troponin peaked over 5000.  On reevaluation patient is now complaining of 2-3 out of 10 chest pressure.  Repeat EKG shows no STEMI.  Consulted with Dr. Nechama Guard, on-call cardiologist.  Plan at this time is to discontinue Nitropaste, transition to nitroglycerin drip and attempt to get chest pain-free.  Cardiology will evaluate the patient.  Continue otherwise with heparin and admission plan.   Lorelle Gibbs, DO 05/02/22 1943

## 2022-05-02 NOTE — H&P (Addendum)
History and Physical    Patient: [name] [id number] DOB: Jun 01, 1945 DOA: 05/02/2022 DOS: the patient was seen and examined on 05/02/2022 PCP: Velna Hatchet, MD  Patient coming from: Home  Chief Complaint:  Chief Complaint  Patient presents with   Chest Pain   HPI: Mohd. Wirts is a 77 y.o. 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   He started to left sided squeezing chest pain last week. This became worse when he lifted boxes yesterday and did yard work today. No nausea, vomiting. No dyspnea. Compliant with his plavix. No tobacco use.  In the ED, afebrile, HR 50-70s, normotensive on room air.   Initial Troponin of 214 with EKG in NSR with LAFB. EDP discussed with Dr. Harl Bowie with cardiology who recommended starting IV heparin infusion with [facility] ([facility]/10).  His second troponin trended up to 5099 and EDP re-consulted cardiology Dr. Nechama Guard who recommended transition him from nitro-paste to nitroglycerin drip and attempt to get chest pain-full. Repeat EKG showed no STEMI.    Currently pain is a 2.5/10 after initiation of nitroglycerin infusion.    Review of Systems: As mentioned in the history of present illness. All other systems reviewed and are negative. Past Medical History:  Diagnosis Date   Anxiety    Arthritis    "wrists" (07/27/2014)   Coronary artery disease    a.  Pt with 4 stents over the course of 2003-2012;  b.  LHC 6/16:  LAD, RCA, LCx stents patent, OM1 50%, EF 50-55% >> med rx    Elevated LFTs    GERD (gastroesophageal reflux disease)    Hypercholesterolemia    Hypertension    Ischemic cardiomyopathy    cMRI 03/2011:  EF 48% with dist ant, septal, apical and inf-apical dyskinesia, no apical clot,  dist ant, apical and septal full thickness scar >> No ICD needed  //  Limited echo 7/19: Large apical septal aneurysm, severe hypokinesis of the entire apex and mid to apical segments of the anterior wall, anterolateral wall and anterior septum-consistent with infarct and most of the LAD territory; EF 35    Left ventricular aneurysm    Limited echo 7/19: Large apical septal aneurysm, severe hypokinesis of the entire apex and mid to apical segments of the anterior wall, anterolateral wall and anterior septum-consistent with infarct and most of the LAD territory; EF 35   Myocardial infarction ([place]) 2003   Past Surgical History:  Procedure Laterality Date   CARDIAC CATHETERIZATION  06/11/2012   Nonobstructive CAD, patent stents, EF 50%, mild apical dyskinesis   CARDIAC CATHETERIZATION N/A 07/28/2014   Procedure: Left Heart Cath and Coronary Angiography;  Surgeon: Troy Sine, MD;  Location: [facility];  Service: Cardiovascular;  Laterality: N/A;   CORONARY ANGIOPLASTY WITH STENT PLACEMENT  2003-2012    total of 4 stents place   CORONARY STENT INTERVENTION N/A 10/20/2017   Procedure: CORONARY STENT INTERVENTION;  Surgeon: Sherren Mocha, MD;  Location: [facility];  Service: Cardiovascular;  Laterality: N/A;   COSMETIC SURGERY Left 1970's   "dog ripped of my ear"   LEFT HEART CATH AND CORONARY ANGIOGRAPHY N/A 10/20/2017   Procedure: LEFT HEART CATH AND CORONARY ANGIOGRAPHY;  Surgeon: Sherren Mocha, MD;  Location: [facility]  INVASIVE [facility];  Service: Cardiovascular;  Laterality: N/A;   LEFT HEART CATHETERIZATION WITH CORONARY ANGIOGRAM N/A 06/11/2012   Procedure: LEFT HEART CATHETERIZATION WITH CORONARY ANGIOGRAM;  Surgeon: Peter M Martinique, MD;  Location: [facility];  Service: Cardiovascular;  Laterality: N/A;   Social History:  reports that he has quit smoking. His smoking use included cigarettes. He has a 15.00 pack-year smoking history. He has never used smokeless tobacco. He reports  that he does not currently use alcohol after a past usage of about 2.0 standard drinks of alcohol per week. He reports that he does not use drugs.  Allergies  Allergen Reactions   Asa [Aspirin] Other (See Comments)    Abdominal bleeding   Penicillins Anaphylaxis    Has patient had a PCN reaction causing immediate rash, facial/tongue/throat swelling, SOB or lightheadedness with hypotension: Yes Has patient had a PCN reaction causing severe rash involving mucus membranes or skin necrosis: No Has patient had a PCN reaction that required hospitalization: No Has patient had a PCN reaction occurring within the last 10 years: No If all of the above answers are "NO", then may proceed with Cephalosporin use.   Statins Other (See Comments)    Stiff joints, muscle tightness, couldn't walk    Family History  Problem Relation Age of Onset   Coronary artery disease Mother    Coronary artery disease Father     Prior to Admission medications   Medication Sig Start Date End Date Taking? Authorizing Provider  carvedilol (COREG) 3.125 MG tablet TAKE 1 TABLET BY MOUTH TWICE A DAY WITH FOOD 04/21/22   Josue Hector, MD  clopidogrel (PLAVIX) 75 MG tablet Take 1 tablet (75 mg total) by mouth daily. 06/11/12   Arguello, Roger A, PA-C  dicyclomine (BENTYL) 20 MG tablet Take 20 mg by mouth 2 (two) times daily after a meal.     [provider]  nitroGLYCERIN (NITROSTAT) 0.4 MG SL tablet PLACE 1 TABLET (0.4 MG TOTAL) UNDER THE TONGUE EVERY 5 (FIVE) MINUTES AS NEEDED FOR CHEST PAIN. FOR A MAX OF 3 DOSES. IF NO RELIEF, CALL 911. 06/03/21   Josue Hector, MD  omega-3 acid ethyl esters (LOVAZA) 1 G capsule Take 1 g by mouth daily.    [provider]  pantoprazole (PROTONIX) 40 MG tablet Take 1 tablet (40 mg total) by mouth daily. 04/26/12   Josue Hector, MD  sacubitril-valsartan (ENTRESTO) 97-103 MG Take 1 tablet by mouth 2 (two) times daily. 03/04/22   Josue Hector, MD  zolpidem (AMBIEN) 10 MG  tablet Take 1 tablet (10 mg total) by mouth at bedtime as needed. For sleep 07/06/12   Josue Hector, MD    Physical Exam: Vitals:   05/02/22 2045 05/02/22 2050 05/02/22 2055 05/02/22 2100  BP: 112/67 (!) 101/58 116/69 115/61  Pulse: 72 (!) 50 69 (!) 53  Resp: '12 10 12 15  '$ Temp:      SpO2: 97% 98% 99% 99%  Weight:      Height:       Constitutional: NAD, calm, comfortable, well-appearing elderly male sitting upright in bed Eyes: lids and conjunctivae normal ENMT: Mucous membranes are moist.  Neck: normal, supple Respiratory: clear to auscultation bilaterally, no wheezing, no crackles. Normal respiratory effort. No accessory muscle use.  Cardiovascular: Regular rate and rhythm, no murmurs / rubs / gallops. No extremity edema. .  Abdomen: soft, non-distended, no tenderness,  Bowel sounds positive.  Musculoskeletal: no clubbing / cyanosis. No joint  deformity upper and lower extremities. Good ROM, no contractures. Normal muscle tone.  Skin: no rashes, lesions, ulcers. No induration Neurologic: CN 2-12 grossly intact. Strength 5/5 in all 4.  Psychiatric: Normal judgment and insight. Alert and oriented x 3. Normal mood. 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 Harl Bowie with cardiology who recommended keeping on IV heparin with plans for [facility] on Monday. However second troponin elevated to 5099 with ongoing chest pain. Cardiology Dr. Gardiner Rhyme re-evaluated at bedside and have started IV nitroglycerin infusion with goal of being chest pain free. They will continue to evaluate tonight. If pain persist, Dr. Ellyn Hack with interventional cardiology has been notified, and will take for cath overnight.   -continue Plavix daily, has intolerant to asprin -intolerant to statin; cardiology recommends discussion of Nexletol outpatient   Hypertension -continue Coreg -holding Entresto until post-cath  Hyperlipidemia -intolerant to statin; cardiology recommends discussion of Nexletol outpatient       Advance Care Planning: Full  Consults: cardiology  Family Communication: none at bedside  Severity of Illness: The appropriate patient status for this patient is INPATIENT. Inpatient status is judged to be reasonable and necessary in order to provide the required intensity of service to ensure the patient's safety. The patient's presenting symptoms, physical exam findings, and initial radiographic and laboratory data in the context of their chronic comorbidities is felt to place them at high risk for further clinical deterioration. Furthermore, it is not anticipated that the patient will be medically stable for discharge from the hospital within 2 midnights of admission.   * I certify that at the point of admission it is my clinical judgment that the patient will require inpatient hospital care spanning beyond 2 midnights from the point of admission due to high intensity of service, high risk for further deterioration and high frequency of surveillance required.*  Author: Orene Desanctis, DO 05/02/2022 9:12 PM  For on call review [email or web address].

## 2022-05-02 NOTE — Consult Note (Addendum)
Cardiology Consultation   Patient ID: [name] MRN: [id number]; DOB: 05-21-1945  Admit date: 05/02/2022 Date of Consult: 05/02/2022  PCP:  Velna Hatchet, [place] Prov[facility] Cardiologist:  Jenkins Rouge, MD        Patient Profile:   [name] is a 77 y.o. male with a hx of CAD and ischemic DCM.  Anterior MI with DES to LAD and circumflex in 2012 in  [place]. 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 Unstable angina August 2019 cath with Dr Burt Knack. 10/20/17 DES to mid LAD for instent  Restenosis. Other arteries ok except small OM1 not suitable for intervention, hx of GI bleed 04/2017 with duodenal erosions s/p clipping  who is being seen 05/02/2022 for the evaluation of chest pain with exertion at the request of Dr. Dina Rich.  History of Present Illness:   [name] is a known patient to Dr. Johnsie Cancel. He has had prior CAD per above, with most recent [facility] 09/2017, s/p DES to mid LAD for instent restenosis. MRI that November showed LAD territory scar. He has an apical aneurysm. He was seen by Dr. Johnsie Cancel in January. Noted his GI bleed hx 2/2 duodenal erosions s/p clipping. He is notably intolerant to aspirin. He was referred to lipid clinic, patient declined PCSK9, considering nexletol.   He presents here with CP. He notes that today he was working in his yard and he felt L sided chest pressure. It states it was 6-7/10. He cannot recall if this was similar to his prior PCI presentations. He was concerned and called for EMS.  He received nitro on arrival. His chest pain resolved. His vitals were normal. In sinus rhythm. He is on room air. He had troponin at 214. EKG shows known lateral infarct.  His renal function is normal. His hgb is 12.9.   Past Medical History:  Diagnosis Date   Anxiety    Arthritis    "wrists" (07/27/2014)   Coronary artery disease    a.  Pt with 4 stents over the course of 2003-2012;  b.  LHC 6/16:  LAD, RCA, LCx stents patent, OM1 50%, EF  50-55% >> med rx    Elevated LFTs    GERD (gastroesophageal reflux disease)    Hypercholesterolemia    Hypertension    Ischemic cardiomyopathy    cMRI 03/2011:  EF 48% with dist ant, septal, apical and inf-apical dyskinesia, no apical clot, dist ant, apical and septal full thickness scar >> No ICD needed  //  Limited echo 7/19: Large apical septal aneurysm, severe hypokinesis of the entire apex and mid to apical segments of the anterior wall, anterolateral wall and anterior septum-consistent with infarct and most of the LAD territory; EF 35    Left ventricular aneurysm    Limited echo 7/19: Large apical septal aneurysm, severe hypokinesis of the entire apex and mid to apical segments of the anterior wall, anterolateral wall and anterior septum-consistent with infarct and most of the LAD territory; EF 35   Myocardial infarction ([place]) 2003    Past Surgical History:  Procedure Laterality Date   CARDIAC CATHETERIZATION  06/11/2012   Nonobstructive CAD, patent stents, EF 50%, mild apical dyskinesis   CARDIAC CATHETERIZATION N/A 07/28/2014   Procedure: Left Heart Cath and Coronary Angiography;  Surgeon: Troy Sine, MD;  Location: [facility];  Service: Cardiovascular;  Laterality: N/A;   CORONARY ANGIOPLASTY WITH STENT PLACEMENT  2003-2012    total of 4 stents place  CORONARY STENT INTERVENTION N/A 10/20/2017   Procedure: CORONARY STENT INTERVENTION;  Surgeon: Sherren Mocha, MD;  Location: [facility];  Service: Cardiovascular;  Laterality: N/A;   COSMETIC SURGERY Left 1970's   "dog ripped of my ear"   LEFT HEART CATH AND CORONARY ANGIOGRAPHY N/A 10/20/2017   Procedure: LEFT HEART CATH AND CORONARY ANGIOGRAPHY;  Surgeon: Sherren Mocha, MD;  Location: [facility];  Service: Cardiovascular;  Laterality: N/A;   LEFT HEART CATHETERIZATION WITH CORONARY ANGIOGRAM N/A 06/11/2012   Procedure: LEFT HEART CATHETERIZATION WITH CORONARY ANGIOGRAM;  Surgeon: Peter M Martinique, MD;   Location: [facility];  Service: Cardiovascular;  Laterality: N/A;     Home Medications:  Prior to Admission medications   Medication Sig Start Date End Date Taking? Authorizing Provider  carvedilol (COREG) 3.125 MG tablet TAKE 1 TABLET BY MOUTH TWICE A DAY WITH FOOD 04/21/22   Josue Hector, MD  clopidogrel (PLAVIX) 75 MG tablet Take 1 tablet (75 mg total) by mouth daily. 06/11/12   Arguello, Roger A, PA-C  dicyclomine (BENTYL) 20 MG tablet Take 20 mg by mouth 2 (two) times daily after a meal.     [provider]  nitroGLYCERIN (NITROSTAT) 0.4 MG SL tablet PLACE 1 TABLET (0.4 MG TOTAL) UNDER THE TONGUE EVERY 5 (FIVE) MINUTES AS NEEDED FOR CHEST PAIN. FOR A MAX OF 3 DOSES. IF NO RELIEF, CALL 911. 06/03/21   Josue Hector, MD  omega-3 acid ethyl esters (LOVAZA) 1 G capsule Take 1 g by mouth daily.    [provider]  pantoprazole (PROTONIX) 40 MG tablet Take 1 tablet (40 mg total) by mouth daily. 04/26/12   Josue Hector, MD  sacubitril-valsartan (ENTRESTO) 97-103 MG Take 1 tablet by mouth 2 (two) times daily. 03/04/22   Josue Hector, MD  zolpidem (AMBIEN) 10 MG tablet Take 1 tablet (10 mg total) by mouth at bedtime as needed. For sleep 07/06/12   Josue Hector, MD    Inpatient Medications: Scheduled Meds:   Continuous Infusions:  heparin 1,000 Units/hr (05/02/22 1813)   PRN Meds:   Allergies:    Allergies  Allergen Reactions   Asa [Aspirin] Other (See Comments)    Abdominal bleeding   Penicillins Anaphylaxis    Has patient had a PCN reaction causing immediate rash, facial/tongue/throat swelling, SOB or lightheadedness with hypotension: Yes Has patient had a PCN reaction causing severe rash involving mucus membranes or skin necrosis: No Has patient had a PCN reaction that required hospitalization: No Has patient had a PCN reaction occurring within the last 10 years: No If all of the above answers are "NO", then may proceed with Cephalosporin use.   Statins  Other (See Comments)    Stiff joints, muscle tightness, couldn't walk    Social History:   Social History   Socioeconomic History   Marital status: Divorced    Spouse name: Not on file   Number of children: Not on file   Years of education: Not on file   Highest education level: Not on file  Occupational History   Occupation: retired  Tobacco Use   Smoking status: Former    Packs/day: 1.00    Years: 15.00    Total pack years: 15.00    Types: Cigarettes   Smokeless tobacco: Never   Tobacco comments:    "quit smoking cigarettes in 1979"  Vaping Use   Vaping Use: Never used  Substance and Sexual Activity   Alcohol use: Not Currently  Alcohol/week: 2.0 standard drinks of alcohol    Types: 2 Glasses of wine per week   Drug use: No   Sexual activity: Not Currently  Other Topics Concern   Not on file  Social History Narrative   Not on file   Social Determinants of Health   Financial Resource Strain: Not on file  Food Insecurity: Not on file  Transportation Needs: Not on file  Physical Activity: Not on file  Stress: Not on file  Social Connections: Not on file  Intimate Partner Violence: Not on file    Family History:    Family History  Problem Relation Age of Onset   Coronary artery disease Mother    Coronary artery disease Father      ROS:  Please see the history of present illness.   All other ROS reviewed and negative.     Physical Exam/Data:   Vitals:   05/02/22 1500 05/02/22 1515 05/02/22 1715 05/02/22 1830  BP: 105/63 130/66 133/69 (!) 143/84  Pulse: (!) 51 (!) 25 (!) 54 76  Resp: '13 14 10 12  '$ Temp:    98 F (36.7 C)  SpO2: 99% 96% 100% 100%  Weight:      Height:       No intake or output data in the 24 hours ending 05/02/22 1851    05/02/2022    2:19 PM 03/14/2022    9:46 AM 01/28/2021    9:43 AM  Last 3 Weights  Weight (lbs) 184 lb 193 lb 3.2 oz 185 lb  Weight (kg) 83.462 kg 87.635 kg 83.915 kg     Body mass index is 28.82 kg/m.   General:  Well nourished, well developed, in no acute distress HEENT: normal Neck: no JVD Vascular: No carotid bruits; Distal pulses 2+ bilaterally Cardiac:  normal S1, S2; RRR; no murmur  Lungs:  clear to auscultation bilaterally, no wheezing, rhonchi or rales  Abd: soft, nontender, no hepatomegaly  Ext: no edema Musculoskeletal:  No deformities, BUE and BLE strength normal and equal Skin: warm and dry  Neuro:  CNs 2-12 intact, no focal abnormalities noted Psych:  Normal affect   EKG:  The EKG was personally reviewed and demonstrates:  NSR, lateral infarct old, LAFB  Telemetry:  Telemetry was personally reviewed and demonstrates:  NSR  Relevant CV Studies: LHC 10/20/2017 Ost 1st Mrg to 1st Mrg lesion is 75% stenosed. Non-stenotic Ost RCA to Mid RCA lesion was previously treated. Mid RCA to Dist RCA lesion is 40% stenosed. Prox Cx to Mid Cx lesion is 30% stenosed. Prox LAD to Mid LAD lesion is 80% stenosed. A drug-eluting stent was successfully placed using a STENT RESOLUTE ONYX 2.5X38. Post intervention, there is a 0% residual stenosis.   1.  Multivessel coronary artery disease with continued patency of the stented segment in the RCA, continued patency of the stented segment in the left circumflex with mild in-stent restenosis and severe stenosis of a small obtuse marginal Emelia Sandoval not suitable for PCI, and severe diffuse stenosis of the proximal and mid LAD treated successfully with overlapping resolute Onyx drug-eluting stents 2.  Known severe LV systolic dysfunction  [id number] IMPRESSION: 1. Mild LVE with thinned and akinetic distal septum, anterior wall apex and inferior apex EF 46%   2. Full thickness scar involving distal septum, anterior wall, apex and inferior apex   3. Tri- leaflet AV with normal aortic root 3.3 cm moderate appearing central AR   4.  Normal RV size and function  5.  Trivial pericardial effusion   6.  Mild LAE  TTE 11/29/2019 1. No thrombus in  the apical aneurysm. Left ventricular ejection  fraction, by estimation, is 40 to 45%. The left ventricle has mildly to  moderately decreased function. The left ventricle demonstrates regional  wall motion abnormalities (see scoring  diagram/findings for description). Left ventricular diastolic parameters  are consistent with Grade I diastolic dysfunction (impaired relaxation).  Elevated left atrial pressure.   2. Right ventricular systolic function is normal. The right ventricular  size is normal. There is mildly elevated pulmonary artery systolic  pressure. The estimated right ventricular systolic pressure is 0000000 mmHg.   3. Left atrial size was mildly dilated.   4. The mitral valve is normal in structure. Mild mitral valve  regurgitation. No evidence of mitral stenosis.   5. Tricuspid valve regurgitation is severe.   6. The aortic valve is normal in structure. Aortic valve regurgitation is  mild. No aortic stenosis is present.   7. Aortic dilatation noted. There is mild dilatation of the ascending  aorta, measuring 42 mm.   8. The inferior vena cava is normal in size with greater than 50%  respiratory variability, suggesting right atrial pressure of 3 mmHg.   TTE 01/20/2022  1. Apical aneurysm present. Patient refused definity contrast so cannot  evaluate for apical thrombus.. Left ventricular ejection fraction, by  estimation, is 45 to 50%. The left ventricle has mildly decreased  function. The left ventricle demonstrates  regional wall motion abnormalities (see scoring diagram/findings for  description). There is moderate concentric left ventricular hypertrophy.  Left ventricular diastolic parameters are consistent with Grade II  diastolic dysfunction (pseudonormalization).  Elevated left ventricular end-diastolic pressure. There is akinesis of the  left ventricular, apical apical segment, septal wall, inferior wall and  anterior wall.   2. Right ventricular systolic function is  normal. The right ventricular  size is normal. There is normal pulmonary artery systolic pressure. The  estimated right ventricular systolic pressure is [id number] mmHg.   3. Left atrial size was mildly dilated.   4. The mitral valve is normal in structure. Mild mitral valve  regurgitation. No evidence of mitral stenosis.   5. The aortic valve is tricuspid. Aortic valve regurgitation is mild by  PHT but the doppler sampling was not in the Ai jet. By colorflow the AI  appears at least moderate and may be severe. Aortic valve sclerosis is  present, with no evidence of aortic  valve stenosis. Aortic regurgitation PHT measures 817 msec.   6. Aortic dilatation noted. There is mild dilatation of the aortic root,  measuring 38 mm. There is mild dilatation of the ascending aorta,  measuring 40 mm.   7. The inferior vena cava is normal in size with greater than 50%  respiratory variability, suggesting right atrial pressure of 3 mmHg.   8. Recommend repeat limited study to evaluate AI further as well as use  definity contrast to rule out apical thrombus.    Laboratory Data:  High Sensitivity Troponin:   Recent Labs  Lab 05/02/22 1514  TROPONINIHS 214*     Chemistry Recent Labs  Lab 05/02/22 1514  NA 138  K 4.3  CL 105  CO2 19*  GLUCOSE 129*  BUN 17  CREATININE 1.20  CALCIUM 9.1  GFRNONAA >60  ANIONGAP 14    Recent Labs  Lab 05/02/22 1514  PROT 6.3*  ALBUMIN 3.6  AST 39  ALT 43  ALKPHOS 40  BILITOT 0.8  Lipids No results for input(s): "CHOL", "TRIG", "HDL", "LABVLDL", "LDLCALC", "CHOLHDL" in the last 168 hours.  Hematology Recent Labs  Lab 05/02/22 1514  WBC 7.1  RBC 4.14*  HGB 12.9*  HCT 39.2  MCV 94.7  MCH 31.2  MCHC 32.9  RDW 15.1  PLT 243   Thyroid No results for input(s): "TSH", "FREET4" in the last 168 hours.  BNPNo results for input(s): "BNP", "PROBNP" in the last 168 hours.  DDimer No results for input(s): "DDIMER" in the last 168  hours.   Radiology/Studies:  DG Chest 2 View  Result Date: 05/02/2022 CLINICAL DATA:  Chest pain. EXAM: CHEST - 2 VIEW COMPARISON:  August 08, 2019. FINDINGS: The heart size and mediastinal contours are within normal limits. Both lungs are clear. The visualized skeletal structures are unremarkable. IMPRESSION: No active cardiopulmonary disease. Electronically Signed   By: Marijo Conception M.D.   On: 05/02/2022 15:39     Assessment and Plan:   NSTEMI: he had CP with exertion and significant CAD hx. Agree this is concerning for nstemi. Hgb is in a good range. Normal crt. Good radial pulses - continue heparin gtt - he is intolerant to asa; continue plavix 75 mg daily - intolerant to statin; defer discussion of nexletol to outpatient - continue home coreg - nitro SL PRN - plan for LHC on Monday  Shared Decision Making/Informed Consent The risks [stroke (1 in 1000), death (1 in 1000), kidney failure [usually temporary] (1 in 500), bleeding (1 in 200), allergic reaction [possibly serious] (1 in 200)], benefits (diagnostic support and management of coronary artery disease) and alternatives of a cardiac catheterization were discussed in detail with Mr. Goicoechea and he is willing to proceed.   2. Ischemic CM:  -he has an apical aneurysm and an LV thrombus could not be ruled out on his prior echo. Will get a limited TTE with contrast to definitively rule it out. - euvolemic - continue coreg - hold entresto until post cath   Risk Assessment/Risk Scores:     TIMI Risk Score for Unstable Angina or Non-ST Elevation MI:   The patient's TIMI risk score is 4, which indicates a 20% risk of all cause mortality, new or recurrent myocardial infarction or need for urgent revascularization in the next 14 days.   For questions or updates, please contact [place] Please consult [email or web address] for contact info under    Signed, Janina Mayo, MD  05/02/2022 6:51 PM

## 2022-05-02 NOTE — ED Notes (Signed)
PT back from imaging.

## 2022-05-02 NOTE — ED Notes (Signed)
NAD noted, respirations are equal bilaterally and unlabored at this time. Pt resting in gurney and denies any unmet needs. Pt connected to CCM, pulseox & BP. Call light within reach. 

## 2022-05-02 NOTE — ED Provider Notes (Signed)
Patient signed out to me by previous provider. Please refer to their note for full HPI.  Briefly this is a 77 year old male with previous history of CAD/PCI who presented to the emergency department with what sounds like angina after doing yard work.  Patient received medication and nitro prior to arrival and is currently reported to be chest pain-free at time of signout.  EKG shows sinus rhythm, T wave inversions in lateral leads.  We are pending lab evaluation, specifically troponin.  Blood work shows baseline CBC, CMP, initial troponin is elevated at 214.  Currently complaining of 2/10 chest pain.  Will plan for small amount of Nitropaste.  Heparin per pharmacy has been ordered.  Consult to cardiology placed for NSTEMI.  Plan for admission.  Spoke with Dr. Harl Bowie, on-call cardiologist.  They are aware and will consult.  Repeat troponin pending.  Currently chest pain-free.  Patients evaluation and results requires admission for further treatment and care.  Spoke with hospitalist, reviewed patient's ED course and they accept admission.  Patient agrees with admission plan, offers no new complaints and is stable/unchanged at time of admit.   Lorelle Gibbs, DO 05/02/22 [id number]

## 2022-05-03 ENCOUNTER — Inpatient Hospital Stay (HOSPITAL_COMMUNITY): Payer: 59

## 2022-05-03 ENCOUNTER — Other Ambulatory Visit: Payer: Self-pay

## 2022-05-03 DIAGNOSIS — I2510 Atherosclerotic heart disease of native coronary artery without angina pectoris: Secondary | ICD-10-CM

## 2022-05-03 DIAGNOSIS — I255 Ischemic cardiomyopathy: Secondary | ICD-10-CM | POA: Diagnosis not present

## 2022-05-03 DIAGNOSIS — I428 Other cardiomyopathies: Secondary | ICD-10-CM

## 2022-05-03 DIAGNOSIS — I214 Non-ST elevation (NSTEMI) myocardial infarction: Secondary | ICD-10-CM | POA: Diagnosis not present

## 2022-05-03 DIAGNOSIS — I10 Essential (primary) hypertension: Secondary | ICD-10-CM | POA: Diagnosis not present

## 2022-05-03 LAB — ECHOCARDIOGRAM COMPLETE
AR max vel: 1.83 cm2
AV Area VTI: 2.19 cm2
AV Area mean vel: 2.07 cm2
AV Mean grad: 4.9 mmHg
AV Peak grad: 11.3 mmHg
Ao pk vel: 1.68 m/s
Area-P 1/2: 3.39 cm2
Est EF: 50
Height: 67 in
MV VTI: 1.11 cm2
P 1/2 time: 759 msec
S' Lateral: 2.7 cm
Weight: 2888.91 oz

## 2022-05-03 LAB — MRSA NEXT GEN BY PCR, NASAL: MRSA by PCR Next Gen: NOT DETECTED

## 2022-05-03 LAB — CBC
HCT: 37.5 % — ABNORMAL LOW (ref 39.0–52.0)
Hemoglobin: 12.9 g/dL — ABNORMAL LOW (ref 13.0–17.0)
MCH: 31.5 pg (ref 26.0–34.0)
MCHC: 34.4 g/dL (ref 30.0–36.0)
MCV: 91.5 fL (ref 80.0–100.0)
Platelets: 223 10*3/uL (ref 150–400)
RBC: 4.1 MIL/uL — ABNORMAL LOW (ref 4.22–5.81)
RDW: 15 % (ref 11.5–15.5)
WBC: 6 10*3/uL (ref 4.0–10.5)
nRBC: 0 % (ref 0.0–0.2)

## 2022-05-03 LAB — GLUCOSE, CAPILLARY: Glucose-Capillary: 113 mg/dL — ABNORMAL HIGH (ref 70–99)

## 2022-05-03 LAB — TROPONIN I (HIGH SENSITIVITY): Troponin I (High Sensitivity): 8349 ng/L (ref <18)

## 2022-05-03 LAB — HEPARIN LEVEL (UNFRACTIONATED): Heparin Unfractionated: 0.15 IU/mL — ABNORMAL LOW (ref 0.30–0.70)

## 2022-05-03 MED ORDER — EMPAGLIFLOZIN 10 MG PO TABS
10.0000 mg | ORAL_TABLET | Freq: Every day | ORAL | Status: DC
  Administered 2022-05-03 – 2022-05-07 (×5): 10 mg via ORAL
  Filled 2022-05-03 (×6): qty 1

## 2022-05-03 MED ORDER — SODIUM CHLORIDE 0.9% FLUSH
3.0000 mL | INTRAVENOUS | Status: DC | PRN
  Administered 2022-05-04: 3 mL via INTRAVENOUS

## 2022-05-03 MED ORDER — SODIUM CHLORIDE 0.9 % IV SOLN: 250.0000 mL | INTRAVENOUS | Status: DC | PRN

## 2022-05-03 MED ORDER — NITROGLYCERIN 0.4 MG SL SUBL: 0.4000 mg | SUBLINGUAL_TABLET | SUBLINGUAL | Status: DC | PRN

## 2022-05-03 MED ORDER — FUROSEMIDE 10 MG/ML IJ SOLN
40.0000 mg | Freq: Once | INTRAMUSCULAR | Status: AC
  Administered 2022-05-03: 40 mg via INTRAVENOUS
  Filled 2022-05-03: qty 4

## 2022-05-03 MED ORDER — HEPARIN (PORCINE) 25000 UT/250ML-% IV SOLN
1250.0000 [IU]/h | INTRAVENOUS | Status: DC
  Administered 2022-05-03: 1000 [IU]/h via INTRAVENOUS
  Administered 2022-05-03 – 2022-05-07 (×5): 1250 [IU]/h via INTRAVENOUS
  Filled 2022-05-03 (×6): qty 250

## 2022-05-03 MED ORDER — SODIUM CHLORIDE 0.9 % IV SOLN: INTRAVENOUS | Status: AC

## 2022-05-03 MED ORDER — TIROFIBAN HCL IN NACL 5-0.9 MG/100ML-% IV SOLN
0.0750 ug/kg/min | INTRAVENOUS | Status: AC
  Administered 2022-05-03: 0.075 ug/kg/min via INTRAVENOUS
  Filled 2022-05-03: qty 250

## 2022-05-03 MED ORDER — PANTOPRAZOLE SODIUM 40 MG PO TBEC
40.0000 mg | DELAYED_RELEASE_TABLET | Freq: Every day | ORAL | Status: DC
  Administered 2022-05-03 – 2022-05-07 (×5): 40 mg via ORAL
  Filled 2022-05-03 (×5): qty 1

## 2022-05-03 MED ORDER — SODIUM CHLORIDE 0.9% FLUSH
3.0000 mL | Freq: Two times a day (BID) | INTRAVENOUS | Status: DC
  Administered 2022-05-03 – 2022-05-07 (×8): 3 mL via INTRAVENOUS

## 2022-05-03 MED ORDER — SODIUM CHLORIDE 0.9% FLUSH
3.0000 mL | Freq: Two times a day (BID) | INTRAVENOUS | Status: DC
  Administered 2022-05-03 – 2022-05-07 (×8): 3 mL via INTRAVENOUS

## 2022-05-03 MED ORDER — PERFLUTREN LIPID MICROSPHERE
1.0000 mL | INTRAVENOUS | Status: AC | PRN
  Administered 2022-05-03: 4 mL via INTRAVENOUS

## 2022-05-03 MED ORDER — CHLORHEXIDINE GLUCONATE CLOTH 2 % EX PADS
6.0000 | MEDICATED_PAD | Freq: Every day | CUTANEOUS | Status: DC
  Administered 2022-05-03 – 2022-05-07 (×5): 6 via TOPICAL

## 2022-05-03 MED ORDER — DICYCLOMINE HCL 20 MG PO TABS: 20.0000 mg | ORAL_TABLET | Freq: Two times a day (BID) | ORAL | Status: DC

## 2022-05-03 MED ORDER — ISOSORBIDE MONONITRATE ER 30 MG PO TB24
30.0000 mg | ORAL_TABLET | Freq: Every day | ORAL | Status: DC
  Administered 2022-05-03 – 2022-05-07 (×5): 30 mg via ORAL
  Filled 2022-05-03 (×5): qty 1

## 2022-05-03 MED ORDER — SACUBITRIL-VALSARTAN 24-26 MG PO TABS
1.0000 | ORAL_TABLET | Freq: Two times a day (BID) | ORAL | Status: AC
  Administered 2022-05-03 – 2022-05-06 (×7): 1 via ORAL
  Filled 2022-05-03 (×7): qty 1

## 2022-05-03 MED ORDER — OMEGA-3-ACID ETHYL ESTERS 1 G PO CAPS
1.0000 g | ORAL_CAPSULE | Freq: Every day | ORAL | Status: DC
  Administered 2022-05-03 – 2022-05-07 (×5): 1 g via ORAL
  Filled 2022-05-03 (×6): qty 1

## 2022-05-03 MED ORDER — SODIUM CHLORIDE 0.9% FLUSH: 3.0000 mL | INTRAVENOUS | Status: DC | PRN

## 2022-05-03 MED ORDER — SODIUM CHLORIDE 0.9 % IV SOLN: INTRAVENOUS | Status: DC

## 2022-05-03 NOTE — Consult Note (Signed)
Reason for Consult:3 vessel CAD, s/p nonSTEMI Referring Physician: Dr. Kathrin Ruddy [name] is an 77 y.o. male.  HPI: 77 yo with known CAD presents with unstable CP.  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  He was having CP for about a week prior to admission, but thought it was reflux.  Was working in his yard yesterday when he had severe squeezing substernal pain.  In ED initial troponin elevated.  Started on IV heparin.  Second troponin 5099,  Had ongoing pain and taken to cath lab.  Found to have patent stents but severe 3 vessel CAD.  Currently pain free.  Past Medical History:  Diagnosis Date   Anxiety    Arthritis    "wrists" (07/27/2014)   Coronary artery disease    a.  Pt with 4 stents over the course of 2003-2012;  b.  LHC 6/16:  LAD, RCA, LCx stents patent, OM1 50%, EF 50-55% >> med rx    Elevated LFTs    GERD (gastroesophageal reflux disease)    Hypercholesterolemia    Hypertension    Ischemic cardiomyopathy    cMRI 03/2011:  EF 48% with dist ant, septal, apical and inf-apical dyskinesia, no apical clot, dist ant, apical and septal full thickness scar >> No ICD needed  //  Limited echo 7/19: Large apical septal aneurysm, severe hypokinesis of the entire apex and mid to apical segments of the anterior wall, anterolateral wall and anterior septum-consistent with infarct and most of the LAD territory; EF 35    Left ventricular aneurysm    Limited echo 7/19: Large apical septal aneurysm, severe hypokinesis of the entire apex and mid to apical segments of the anterior wall, anterolateral wall and anterior septum-consistent with infarct and most of the LAD territory; EF 35   Myocardial infarction ([name]) 2003    Past Surgical History:  Procedure Laterality Date   CARDIAC CATHETERIZATION  06/11/2012   Nonobstructive CAD, patent stents, EF  50%, mild apical dyskinesis   CARDIAC CATHETERIZATION N/A 07/28/2014   Procedure: Left Heart Cath and Coronary Angiography;  Surgeon: Troy Sine, MD;  Location: [facility];  Service: Cardiovascular;  Laterality: N/A;   CORONARY ANGIOPLASTY WITH STENT PLACEMENT  2003-2012    total of 4 stents place   CORONARY STENT INTERVENTION N/A 10/20/2017   Procedure: CORONARY STENT INTERVENTION;  Surgeon: Sherren Mocha, MD;  Location: [facility];  Service: Cardiovascular;  Laterality: N/A;   COSMETIC SURGERY Left 1970's   "dog ripped of my ear"   LEFT HEART CATH AND CORONARY ANGIOGRAPHY N/A 10/20/2017   Procedure: LEFT HEART CATH AND CORONARY ANGIOGRAPHY;  Surgeon: Sherren Mocha, MD;  Location: [facility];  Service: Cardiovascular;  Laterality: N/A;   LEFT HEART CATHETERIZATION WITH CORONARY ANGIOGRAM N/A 06/11/2012   Procedure: LEFT HEART CATHETERIZATION WITH CORONARY ANGIOGRAM;  Surgeon: Peter M Martinique, MD;  Location: [facility];  Service: Cardiovascular;  Laterality: N/A;    Family History  Problem Relation Age of Onset   Coronary artery disease Mother    Coronary artery disease Father     Social History:  reports that he has quit smoking. His smoking use included cigarettes. He has a 15.00 pack-year smoking history. He has never used smokeless tobacco. He reports that he does not currently use alcohol after a past usage of about 2.0 standard drinks of alcohol  per week. He reports that he does not use drugs.  Allergies:  Allergies  Allergen Reactions   Asa [Aspirin] Other (See Comments)    Abdominal bleeding   Penicillins Anaphylaxis    Has patient had a PCN reaction causing immediate rash, facial/tongue/throat swelling, SOB or lightheadedness with hypotension: Yes Has patient had a PCN reaction causing severe rash involving mucus membranes or skin necrosis: No Has patient had a PCN reaction that required hospitalization: No Has patient had a PCN reaction occurring  within the last 10 years: No If all of the above answers are "NO", then may proceed with Cephalosporin use.   Statins Other (See Comments)    Stiff joints, muscle tightness, couldn't walk    Medications: Scheduled:  carvedilol  3.125 mg Oral BID WC   Chlorhexidine Gluconate Cloth  6 each Topical Daily   empagliflozin  10 mg Oral Daily   furosemide  40 mg Intravenous Once   isosorbide mononitrate  30 mg Oral Daily   omega-3 acid ethyl esters  1 g Oral Daily   pantoprazole  40 mg Oral Daily   sacubitril-valsartan  1 tablet Oral BID   sodium chloride flush  3 mL Intravenous Q12H   sodium chloride flush  3 mL Intravenous Q12H    Results for orders placed or performed during the hospital encounter of 05/02/22 (from the past 48 hour(s))  CBC with Differential     Status: Abnormal   Collection Time: 05/02/22  3:14 PM  Result Value Ref Range   WBC 7.1 4.0 - 10.5 K/uL   RBC 4.14 (L) 4.22 - 5.81 MIL/uL   Hemoglobin 12.9 (L) 13.0 - 17.0 g/dL   HCT 39.2 39.0 - 52.0 %   MCV 94.7 80.0 - 100.0 fL   MCH 31.2 26.0 - 34.0 pg   MCHC 32.9 30.0 - 36.0 g/dL   RDW 15.1 11.5 - 15.5 %   Platelets 243 150 - 400 K/uL   nRBC 0.0 0.0 - 0.2 %   Neutrophils Relative % 70 %   Neutro Abs 4.9 1.7 - 7.7 K/uL   Lymphocytes Relative 22 %   Lymphs Abs 1.6 0.7 - 4.0 K/uL   Monocytes Relative 6 %   Monocytes Absolute 0.4 0.1 - 1.0 K/uL   Eosinophils Relative 2 %   Eosinophils Absolute 0.2 0.0 - 0.5 K/uL   Basophils Relative 0 %   Basophils Absolute 0.0 0.0 - 0.1 K/uL   Immature Granulocytes 0 %   Abs Immature Granulocytes 0.01 0.00 - 0.07 K/uL    Comment: Performed at [facility], [address] [place], [place] [address]  Comprehensive metabolic panel     Status: Abnormal   Collection Time: 05/02/22  3:14 PM  Result Value Ref Range   Sodium 138 135 - 145 mmol/L   Potassium 4.3 3.5 - 5.1 mmol/L   Chloride 105 98 - 111 mmol/L   CO2 19 (L) 22 - 32 mmol/L   Glucose, Bld 129 (H) 70 - 99 mg/dL     Comment: Glucose reference range applies only to samples taken after fasting for at least 8 hours.   BUN 17 8 - 23 mg/dL   Creatinine, Ser 1.20 0.61 - 1.24 mg/dL   Calcium 9.1 8.9 - 10.3 mg/dL   Total Protein 6.3 (L) 6.5 - 8.1 g/dL   Albumin 3.6 3.5 - 5.0 g/dL   AST 39 15 - 41 U/L   ALT 43 0 - 44 U/L   Alkaline Phosphatase  40 38 - 126 U/L   Total Bilirubin 0.8 0.3 - 1.2 mg/dL   GFR, Estimated >60 >60 mL/min    Comment: (NOTE) Calculated using the CKD-EPI Creatinine Equation (2021)    Anion gap 14 5 - 15    Comment: Performed at [facility] [address], [place], [place] [address]  Troponin I (High Sensitivity)     Status: Abnormal   Collection Time: 05/02/22  3:14 PM  Result Value Ref Range   Troponin I (High Sensitivity) 214 (HH) <18 ng/L    Comment: CRITICAL RESULT CALLED TO, READ BACK BY AND VERIFIED WITH C.KHOURI RN '@[id number]'$  05/02/22 E,BENTON (NOTE) Elevated high sensitivity troponin I (hsTnI) values and significant  changes across serial measurements may suggest ACS but many other  chronic and acute conditions are known to elevate hsTnI results.  Refer to the "Links" section for chest pain algorithms and additional  guidance. Performed at [facility], [place] [address] [place], [place] [address]   Troponin I (High Sensitivity)     Status: Abnormal   Collection Time: 05/02/22  5:14 PM  Result Value Ref Range   Troponin I (High Sensitivity) 5,099 (HH) <18 ng/L    Comment: CRITICAL VALUE NOTED. VALUE IS CONSISTENT WITH PREVIOUSLY REPORTED/CALLED VALUE (NOTE) Elevated high sensitivity troponin I (hsTnI) values and significant  changes across serial measurements may suggest ACS but many other  chronic and acute conditions are known to elevate hsTnI results.  Refer to the "Links" section for chest pain algorithms and additional  guidance. Performed at [facility], [place] [address], [place], [place] [address]   Troponin I (High Sensitivity)     Status:  Abnormal   Collection Time: 05/02/22  9:17 PM  Result Value Ref Range   Troponin I (High Sensitivity) 8,341 (HH) <18 ng/L    Comment: CRITICAL VALUE NOTED. VALUE IS CONSISTENT WITH PREVIOUSLY REPORTED/CALLED VALUE (NOTE) Elevated high sensitivity troponin I (hsTnI) values and significant  changes across serial measurements may suggest ACS but many other  chronic and acute conditions are known to elevate hsTnI results.  Refer to the "Links" section for chest pain algorithms and additional  guidance. Performed at [facility], [place] [address], [place], [place] [address]   Glucose, capillary     Status: Abnormal   Collection Time: 05/03/22 12:23 AM  Result Value Ref Range   Glucose-Capillary 113 (H) 70 - 99 mg/dL    Comment: Glucose reference range applies only to samples taken after fasting for at least 8 hours.  MRSA Next Gen by PCR, Nasal     Status: None   Collection Time: 05/03/22 12:35 AM   Specimen: Nasal Mucosa; Nasal Swab  Result Value Ref Range   MRSA by PCR Next Gen NOT DETECTED NOT DETECTED    Comment: (NOTE) The GeneXpert MRSA Assay (FDA approved for NASAL specimens only), is one component of a comprehensive MRSA colonization surveillance program. It is not intended to diagnose MRSA infection nor to guide or monitor treatment for MRSA infections. Test performance is not FDA approved in patients less than 80 years old. Performed at [facility], [place] [address] [place], [place] [address]   Troponin I (High Sensitivity)     Status: Abnormal   Collection Time: 05/03/22  1:47 AM  Result Value Ref Range   Troponin I (High Sensitivity) 8,349 (HH) <18 ng/L    Comment: CRITICAL VALUE NOTED. VALUE IS CONSISTENT WITH PREVIOUSLY REPORTED/CALLED VALUE (NOTE) Elevated  high sensitivity troponin I (hsTnI) values and significant  changes across serial measurements may suggest ACS but many other  chronic and acute conditions are known to elevate hsTnI results.  Refer to  the "Links" section for chest pain algorithms and additional  guidance. Performed at [facility], [place] [address], [place], [place] [address]   CBC     Status: Abnormal   Collection Time: 05/03/22  1:47 AM  Result Value Ref Range   WBC 6.0 4.0 - 10.5 K/uL   RBC 4.10 (L) 4.22 - 5.81 MIL/uL   Hemoglobin 12.9 (L) 13.0 - 17.0 g/dL   HCT 37.5 (L) 39.0 - 52.0 %   MCV 91.5 80.0 - 100.0 fL   MCH 31.5 26.0 - 34.0 pg   MCHC 34.4 30.0 - 36.0 g/dL   RDW 15.0 11.5 - 15.5 %   Platelets 223 150 - 400 K/uL   nRBC 0.0 0.0 - 0.2 %    Comment: Performed at [facility], [place] [address], [place], [place] [address]    CARDIAC CATHETERIZATION  Result Date: 05/03/2022   Prox LAD lesion is 75% stenosed - just prior to stent   Previously placed Mid LAD to Dist LAD stent of unknown type is  widely patent.   Prox Cx to Mid Cx lesion is 65% stenosed -just proximal to prior stent   Previously placed Prox RCA to Mid RCA stent of unknown type is  widely patent.   Previously placed Mid Cx to Dist Cx stent is 5% stenosed with 90% stenosed side branch in 2nd Mrg.   Mid RCA lesion is 80% stenosed - just distal to stent.  Mid RCA to Dist RCA lesion is 65% stenosed.  Dist RCA lesion is 80% stenosed.   -----------------------------------------------   There is moderate left ventricular systolic dysfunction.  The left ventricular ejection fraction is 35-45% by visual estimate.   LV end diastolic pressure is severely elevated.   There is no aortic valve stenosis. 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 Recommend 2D echo to better assess) PLAN Unsure what the  best option here is.  Clearly all the lesion segments are treatable with PCI with exception of potentially the OM branch which is jailed.  Complete revascularization with CABG is also an option.  At this point, the best plan is to temporize medically and discussed with interventional colleagues as well as CVTS consultation (will need to be called in AM by rounding MD). Treat with a short course of Aggrastat (6 hours) for potential thrombotic disease in OM2. RCA and LAD given his ongoing pain. Restart heparin 6 to 8 hours after sheath removal which would be after Aggrastat completion. Hold clopidogrel Check 2D echo to better assess EF Continue IV nitroglycerin infusion and will hold Entresto for now defer to rounding team based on blood pressure to determine whether will be restarted.  We will continue carvedilol. Glenetta Hew  DG Chest 2 View  Result Date: 05/02/2022 CLINICAL DATA:  Chest pain. EXAM: CHEST - 2 VIEW COMPARISON:  August 08, 2019. FINDINGS: The heart size and mediastinal contours are within normal limits. Both lungs are  clear. The visualized skeletal structures are unremarkable. IMPRESSION: No active cardiopulmonary disease. Electronically Signed   By: Marijo Conception M.D.   On: 05/02/2022 15:39    I personally reviewed the cath images.  Severe 3 vessel CAD.  EF appeared to be around 50% by echo but no official report yet  Review of Systems  Constitutional:  Positive for fatigue. Negative for activity change.  Respiratory:  Positive for shortness of breath.   Cardiovascular:  Positive for chest pain.  Gastrointestinal:  Positive for abdominal pain (reflux).  Musculoskeletal:  Positive for neck pain.  Neurological:  Positive for headaches.  Hematological:  Bruises/bleeds easily.  Psychiatric/Behavioral:  The patient is nervous/anxious.   All other systems reviewed and are negative.  Blood pressure 117/61, pulse 73, temperature 97.7 F (36.5 C), resp. rate 16, height '5\' 7"'$  (1.702 m),  weight 81.9 kg, SpO2 93 %. Physical Exam Vitals reviewed.  Constitutional:      General: He is not in acute distress.    Appearance: He is well-developed.  HENT:     Head: Normocephalic and atraumatic.  Neck:     Vascular: Carotid bruit (on right) present.  Cardiovascular:     Rate and Rhythm: Normal rate and regular rhythm.     Heart sounds: Normal heart sounds. No murmur heard.    No friction rub. No gallop.  Pulmonary:     Effort: Pulmonary effort is normal. No respiratory distress.     Breath sounds: Normal breath sounds. No wheezing.  Abdominal:     General: There is no distension.     Palpations: Abdomen is soft.  Musculoskeletal:     Right lower leg: No edema.     Left lower leg: No edema.  Skin:    General: Skin is warm and dry.  Neurological:     General: No focal deficit present.     Mental Status: He is alert and oriented to person, place, and time.     Cranial Nerves: No cranial nerve deficit.     Assessment/Plan: 77 yo man with multiple CRF and known CAD presents with unstable CP and r/I for non STEMI.  At cath has severe 3 vessel CAD.  CABG indicated for survival benefit and relief of symptoms.   I discussed the general nature of the procedure, including the need for general anesthesia, the incisions to be used, the use of cardiopulmonary bypass, and the use of drainage tubes and pacemaker wires with [name].  We discussed the expected hospital stay, overall recovery and short and long term outcomes. I informed him of the indications, risks, benefits and alternatives.  He understands the risks include but are not limited to death, stroke, MI, DVT/PE, bleeding, possible need for transfusion, infections, cardiac arrhythmias, as well as other organ system dysfunction including respiratory, renal, or GI complications.   Plavix- will ned 5 days off Plavix prior to surgery later next week  Right carotid bruit- will assess with duplex  Melrose Nakayama 05/03/2022, 11:28 AM

## 2022-05-03 NOTE — Progress Notes (Addendum)
ANTICOAGULATION CONSULT NOTE - Initial Consult  Pharmacy Consult for Heparin (following 6 hours of Tirofiban) Indication: multi-vessel CAD s/p cath   Allergies  Allergen Reactions   Asa [Aspirin] Other (See Comments)    Abdominal bleeding   Penicillins Anaphylaxis    Has patient had a PCN reaction causing immediate rash, facial/tongue/throat swelling, SOB or lightheadedness with hypotension: Yes Has patient had a PCN reaction causing severe rash involving mucus membranes or skin necrosis: No Has patient had a PCN reaction that required hospitalization: No Has patient had a PCN reaction occurring within the last 10 years: No If all of the above answers are "NO", then may proceed with Cephalosporin use.   Statins Other (See Comments)    Stiff joints, muscle tightness, couldn't walk    Patient Measurements: Height: '5\' 7"'$  (170.2 cm) Weight: 81.9 kg (180 lb 8.9 oz) IBW/kg (Calculated) : 66.1 Heparin DQ: 82 kg  Vital Signs: Temp: 97.7 F (36.5 C) (03/09 0730) BP: 105/62 (03/09 1400) Pulse Rate: 97 (03/09 1445)  Labs: Recent Labs    05/02/22 1514 05/02/22 1714 05/02/22 2117 05/03/22 0147 05/03/22 1428  HGB 12.9*  --   --  12.9*  --   HCT 39.2  --   --  37.5*  --   PLT 243  --   --  223  --   HEPARINUNFRC  --   --   --   --  0.15*  CREATININE 1.20  --   --   --   --   TROPONINIHS 214* 5,099* 8,341* 8,349*  --      Estimated Creatinine Clearance: 53.6 mL/min (by C-G formula based on SCr of 1.2 mg/dL).   Medical History: Past Medical History:  Diagnosis Date   Anxiety    Arthritis    "wrists" (07/27/2014)   Coronary artery disease    a.  Pt with 4 stents over the course of 2003-2012;  b.  LHC 6/16:  LAD, RCA, LCx stents patent, OM1 50%, EF 50-55% >> med rx    Elevated LFTs    GERD (gastroesophageal reflux disease)    Hypercholesterolemia    Hypertension    Ischemic cardiomyopathy    cMRI 03/2011:  EF 48% with dist ant, septal, apical and inf-apical dyskinesia, no  apical clot, dist ant, apical and septal full thickness scar >> No ICD needed  //  Limited echo 7/19: Large apical septal aneurysm, severe hypokinesis of the entire apex and mid to apical segments of the anterior wall, anterolateral wall and anterior septum-consistent with infarct and most of the LAD territory; EF 35    Left ventricular aneurysm    Limited echo 7/19: Large apical septal aneurysm, severe hypokinesis of the entire apex and mid to apical segments of the anterior wall, anterolateral wall and anterior septum-consistent with infarct and most of the LAD territory; EF 35   Myocardial infarction [facility]) 2003    Assessment: [name] is a 77 y/o M with chest pain/NSTEMI who went for urgent cath last night. He was found to have severe multi-vessel CAD. Now s/p tirofiban x 6 hours post-cath then re-starting heparin drip. Baseline CBC ok. Renal function ok. Planning CABG after 5 day Plavix washout.  Goal of Therapy:  Heparin level 0.3-0.7 units/ml Monitor platelets by anticoagulation protocol: Yes   Plan:  Increase heparin drip to 1250 units/hr. Avoiding bolus with recent cath. 8 hour heparin level  Thank you for involving pharmacy in this patient's care.  Reatha Harps, PharmD PGY2 Pharmacy Resident  05/03/2022 3:26 PM

## 2022-05-03 NOTE — Progress Notes (Signed)
Cardiology Progress Note  Patient ID: [name] MRN: [id number] DOB: 03-Dec-1945 Date of Encounter: 05/03/2022  Primary Cardiologist: Jenkins Rouge, MD  Subjective   Chief Complaint: CP  HPI: Left heart catheterization with poor targets for PCI.  CABG eval recommended.  3 out of 10 chest pressure reported.  Symptoms are controlled.  ROS:  All other ROS reviewed and negative. Pertinent positives noted in the HPI.     Inpatient Medications  Scheduled Meds:  carvedilol  3.125 mg Oral BID WC   Chlorhexidine Gluconate Cloth  6 each Topical Daily   omega-3 acid ethyl esters  1 g Oral Daily   pantoprazole  40 mg Oral Daily   sacubitril-valsartan  1 tablet Oral BID   sodium chloride flush  3 mL Intravenous Q12H   sodium chloride flush  3 mL Intravenous Q12H   Continuous Infusions:  sodium chloride     [START ON 05/04/2022] sodium chloride     sodium chloride     heparin 1,000 Units/hr (05/03/22 0635)   nitroGLYCERIN 3 mcg/min (05/03/22 0637)   PRN Meds: sodium chloride, sodium chloride, acetaminophen, nitroGLYCERIN, ondansetron (ZOFRAN) IV, sodium chloride flush, sodium chloride flush   Vital Signs   Vitals:   05/03/22 0730 05/03/22 0745 05/03/22 0800 05/03/22 0815  BP:   117/61   Pulse: 61 69 75 73  Resp: '16 15 16 16  '$ Temp: 97.7 F (36.5 C)     TempSrc:      SpO2: 94% 92% 93% 93%  Weight:      Height:        Intake/Output Summary (Last 24 hours) at 05/03/2022 1047 Last data filed at 05/03/2022 0600 Gross per 24 hour  Intake 689.86 ml  Output 250 ml  Net 439.86 ml      05/03/2022    6:00 AM 05/03/2022   12:24 AM 05/02/2022    2:19 PM  Last 3 Weights  Weight (lbs) 180 lb 8.9 oz 180 lb 8.9 oz 184 lb  Weight (kg) 81.9 kg 81.9 kg 83.462 kg      Telemetry  Overnight telemetry shows sinus rhythm 70s, which I personally reviewed.   ECG  The most recent ECG shows sinus rhythm, left anterior fascicular block, which I personally reviewed.   Physical Exam   Vitals:    05/03/22 0730 05/03/22 0745 05/03/22 0800 05/03/22 0815  BP:   117/61   Pulse: 61 69 75 73  Resp: '16 15 16 16  '$ Temp: 97.7 F (36.5 C)     TempSrc:      SpO2: 94% 92% 93% 93%  Weight:      Height:        Intake/Output Summary (Last 24 hours) at 05/03/2022 1047 Last data filed at 05/03/2022 0600 Gross per 24 hour  Intake 689.86 ml  Output 250 ml  Net 439.86 ml       05/03/2022    6:00 AM 05/03/2022   12:24 AM 05/02/2022    2:19 PM  Last 3 Weights  Weight (lbs) 180 lb 8.9 oz 180 lb 8.9 oz 184 lb  Weight (kg) 81.9 kg 81.9 kg 83.462 kg    Body mass index is 28.28 kg/m.  General: Well nourished, well developed, in no acute distress Head: Atraumatic, normal size  Eyes: PEERLA, EOMI  Neck: Supple, no JVD Endocrine: No thryomegaly Cardiac: Normal S1, S2; RRR; no murmurs, rubs, or gallops Lungs: Clear to auscultation bilaterally, no wheezing, rhonchi or rales  Abd: Soft, nontender, no hepatomegaly  Ext:  Right radial cath site 2+ Musculoskeletal: No deformities, BUE and BLE strength normal and equal Skin: Warm and dry, no rashes   Neuro: Alert and oriented to person, place, time, and situation, CNII-XII grossly intact, no focal deficits  Psych: Normal mood and affect   Labs  High Sensitivity Troponin:   Recent Labs  Lab 05/02/22 1514 05/02/22 1714 05/02/22 2117 05/03/22 0147  TROPONINIHS 214* 5,099* 8,341* 8,349*     Cardiac EnzymesNo results for input(s): "TROPONINI" in the last 168 hours. No results for input(s): "TROPIPOC" in the last 168 hours.  Chemistry Recent Labs  Lab 05/02/22 1514  NA 138  K 4.3  CL 105  CO2 19*  GLUCOSE 129*  BUN 17  CREATININE 1.20  CALCIUM 9.1  PROT 6.3*  ALBUMIN 3.6  AST 39  ALT 43  ALKPHOS 40  BILITOT 0.8  GFRNONAA >60  ANIONGAP 14    Hematology Recent Labs  Lab 05/02/22 1514 05/03/22 0147  WBC 7.1 6.0  RBC 4.14* 4.10*  HGB 12.9* 12.9*  HCT 39.2 37.5*  MCV 94.7 91.5  MCH 31.2 31.5  MCHC 32.9 34.4  RDW 15.1 15.0  PLT 243  223   BNPNo results for input(s): "BNP", "PROBNP" in the last 168 hours.  DDimer No results for input(s): "DDIMER" in the last 168 hours.   Radiology  CARDIAC CATHETERIZATION  Result Date: 05/03/2022   Prox LAD lesion is 75% stenosed - just prior to stent   Previously placed Mid LAD to Dist LAD stent of unknown type is  widely patent.   Prox Cx to Mid Cx lesion is 65% stenosed -just proximal to prior stent   Previously placed Prox RCA to Mid RCA stent of unknown type is  widely patent.   Previously placed Mid Cx to Dist Cx stent is 5% stenosed with 90% stenosed side branch in 2nd Mrg.   Mid RCA lesion is 80% stenosed - just distal to stent.  Mid RCA to Dist RCA lesion is 65% stenosed.  Dist RCA lesion is 80% stenosed.   -----------------------------------------------   There is moderate left ventricular systolic dysfunction.  The left ventricular ejection fraction is 35-45% by visual estimate.   LV end diastolic pressure is severely elevated.   There is no aortic valve stenosis. 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 Recommend 2D echo to better assess) PLAN Unsure what the best option here is.  Clearly all the lesion segments are treatable with PCI with exception of potentially the OM branch which is jailed.  Complete revascularization with CABG is also an option.  At this point, the best plan is to temporize medically and discussed with interventional colleagues as well as CVTS consultation (will need to be called in AM by rounding MD). Treat with a short course of Aggrastat (6 hours) for potential thrombotic disease in OM2. RCA and LAD given  his ongoing pain. Restart heparin 6 to 8 hours after sheath removal which would be after Aggrastat completion. Hold clopidogrel Check 2D echo to better assess EF Continue IV nitroglycerin infusion and will hold Entresto for now defer to rounding team based on blood pressure to determine whether  will be restarted.  We will continue carvedilol. Glenetta Hew  DG Chest 2 View  Result Date: 05/02/2022 CLINICAL DATA:  Chest pain. EXAM: CHEST - 2 VIEW COMPARISON:  August 08, 2019. FINDINGS: The heart size and mediastinal contours are within normal limits. Both lungs are clear. The visualized skeletal structures are unremarkable. IMPRESSION: No active cardiopulmonary disease. Electronically Signed   By: Marijo Conception M.D.   On: 05/02/2022 15:39    Cardiac Studies  TTE 01/20/2022  1. Apical aneurysm present. Patient refused definity contrast so cannot  evaluate for apical thrombus.. Left ventricular ejection fraction, by  estimation, is 45 to 50%. The left ventricle has mildly decreased  function. The left ventricle demonstrates  regional wall motion abnormalities (see scoring diagram/findings for  description). There is moderate concentric left ventricular hypertrophy.  Left ventricular diastolic parameters are consistent with Grade II  diastolic dysfunction (pseudonormalization).  Elevated left ventricular end-diastolic pressure. There is akinesis of the  left ventricular, apical apical segment, septal wall, inferior wall and  anterior wall.   2. Right ventricular systolic function is normal. The right ventricular  size is normal. There is normal pulmonary artery systolic pressure. The  estimated right ventricular systolic pressure is [id number] mmHg.   3. Left atrial size was mildly dilated.   4. The mitral valve is normal in structure. Mild mitral valve  regurgitation. No evidence of mitral stenosis.   5. The aortic valve is tricuspid. Aortic valve regurgitation is mild by  PHT but the doppler  sampling was not in the Ai jet. By colorflow the AI  appears at least moderate and may be severe. Aortic valve sclerosis is  present, with no evidence of aortic  valve stenosis. Aortic regurgitation PHT measures 817 msec.   6. Aortic dilatation noted. There is mild dilatation of the aortic root,  measuring 38 mm. There is mild dilatation of the ascending aorta,  measuring 40 mm.   7. The inferior vena cava is normal in size with greater than 50%  respiratory variability, suggesting right atrial pressure of 3 mmHg.   8. Recommend repeat limited study to evaluate AI further as well as use  definity contrast to rule out apical thrombus.   LHC 05/02/2022   Prox LAD lesion is 75% stenosed - just prior to stent   Previously placed Mid LAD to Dist LAD stent of unknown type is  widely patent.   Prox Cx to Mid Cx lesion is 65% stenosed -just proximal to prior stent   Previously placed Prox RCA to Mid RCA stent of unknown type is  widely patent.   Previously placed Mid Cx to Dist Cx stent is 5% stenosed with 90% stenosed side branch in 2nd Mrg.   Mid RCA lesion is 80% stenosed - just distal to stent.  Mid RCA to Dist RCA lesion is 65% stenosed.  Dist RCA lesion is 80% stenosed.   -----------------------------------------------   There is moderate left ventricular systolic dysfunction.  The left ventricular ejection fraction is 35-45% by visual estimate.   LV end diastolic pressure is severely elevated.   There is no aortic valve stenosis.   POST-OP DIAGNOSES Severe Multi-Vessel CAD:  Prox LAD 75%@ SP1 (just prior to extensive stented segment) Prox-mid LCx ~65% (prox stent edge lesion) with progression of jailed OM2 lesion to 90% (likely Culprit) Patent Prox to mid RCA stents with sequential focal lesions 80%-60-70% lesions & 80%   Known to be moderate Ischemic CM - EG ~ 40% (unable to  assess regional wall motion abnormalities due to poor filling. Recommend 2D echo to better assess)       PLAN Unsure what the best option here is.  Clearly all the lesion segments are treatable with PCI with exception of potentially the OM branch which is jailed.  Complete revascularization with CABG is also an option.  At this point, the best plan is to temporize medically and discussed with interventional colleagues as well as CVTS consultation (will need to be called in AM by rounding MD). Treat with a short course of Aggrastat (6 hours) for potential thrombotic disease in OM2. RCA and LAD given his ongoing pain. Restart heparin 6 to 8 hours after sheath removal which would be after Aggrastat completion. Hold clopidogrel Check 2D echo to better assess EF Continue IV nitroglycerin infusion and will hold Entresto for now defer to rounding team based on blood pressure to determine whether will be restarted.  We will continue carvedilol.  Patient Profile  [name] is a 77 y.o. male with CAD, systolic heart failure, hyperlipidemia (statin intolerant) who was admitted on 05/02/2022 with non-STEMI.  Assessment & Plan   # Non-STEMI # Three-vessel CAD -Left heart cath overnight showed three-vessel CAD.  Interventional cardiology recommended CABG evaluation. -3 out of 10 chest discomfort.  Wean nitro drip.  Add Imdur. -Continue aspirin and heparin drip. -Continue beta-blocker.  Coreg 3.125 mg twice daily. -Statin intolerant. -Repeat echo is pending.  LVEDP 24 mmHg.  40 mg IV Lasix one-time dose. -I have reached out to CT surgery for surgical consultation.  Options will either be PCI versus CABG.  # Acute systolic heart failure, EF 45% -LVEDP 22 mmHg.  Giving one-time dose of Lasix. -No signs of shock.  Continue carvedilol 3.125 mg twice daily.  Add back Entresto 24-26 mg twice daily. -We can further titrate medical therapy as we are able.  I will add an SGLT2 inhibitor.  #HLD -Statin intolerant.  Should be considered for outpatient PCSK9 inhibitor therapy.  FEN -No intravenous  fluids -Diet: Heart healthy -DVT Ppx: Heparin drip -Code: Full -Disposition: Remain in ICU today.  Possible transfer to progressive unit tomorrow.    For questions or updates, please contact [place] Please consult [email or web address] for contact info under   Signed, [place] T. Audie Box, MD, [place]  05/03/2022 10:47 AM

## 2022-05-03 NOTE — Progress Notes (Signed)
ANTICOAGULATION CONSULT NOTE - Initial Consult  Pharmacy Consult for Heparin (following 6 hours of Tirofiban) Indication: multi-vessel CAD s/p cath, awaiting definitive treatment plan  Allergies  Allergen Reactions   Asa [Aspirin] Other (See Comments)    Abdominal bleeding   Penicillins Anaphylaxis    Has patient had a PCN reaction causing immediate rash, facial/tongue/throat swelling, SOB or lightheadedness with hypotension: Yes Has patient had a PCN reaction causing severe rash involving mucus membranes or skin necrosis: No Has patient had a PCN reaction that required hospitalization: No Has patient had a PCN reaction occurring within the last 10 years: No If all of the above answers are "NO", then may proceed with Cephalosporin use.   Statins Other (See Comments)    Stiff joints, muscle tightness, couldn't walk    Patient Measurements: Height: '5\' 7"'$  (170.2 cm) Weight: 81.9 kg (180 lb 8.9 oz) IBW/kg (Calculated) : 66.1   Vital Signs: Temp: 97.6 F (36.4 C) (03/09 0024) Temp Source: Oral (03/09 0024) BP: 106/55 (03/09 0300) Pulse Rate: 46 (03/09 0300)  Labs: Recent Labs    05/02/22 1514 05/02/22 1714 05/02/22 2117 05/03/22 0147  HGB 12.9*  --   --   --   HCT 39.2  --   --   --   PLT 243  --   --   --   CREATININE 1.20  --   --   --   TROPONINIHS 214* 5,099* 8,341* 8,349*    Estimated Creatinine Clearance: 53.6 mL/min (by C-G formula based on SCr of 1.2 mg/dL).   Medical History: Past Medical History:  Diagnosis Date   Anxiety    Arthritis    "wrists" (07/27/2014)   Coronary artery disease    a.  Pt with 4 stents over the course of 2003-2012;  b.  LHC 6/16:  LAD, RCA, LCx stents patent, OM1 50%, EF 50-55% >> med rx    Elevated LFTs    GERD (gastroesophageal reflux disease)    Hypercholesterolemia    Hypertension    Ischemic cardiomyopathy    cMRI 03/2011:  EF 48% with dist ant, septal, apical and inf-apical dyskinesia, no apical clot, dist ant, apical and  septal full thickness scar >> No ICD needed  //  Limited echo 7/19: Large apical septal aneurysm, severe hypokinesis of the entire apex and mid to apical segments of the anterior wall, anterolateral wall and anterior septum-consistent with infarct and most of the LAD territory; EF 35    Left ventricular aneurysm    Limited echo 7/19: Large apical septal aneurysm, severe hypokinesis of the entire apex and mid to apical segments of the anterior wall, anterolateral wall and anterior septum-consistent with infarct and most of the LAD territory; EF 35   Myocardial infarction [facility]) 2003    Assessment: [name] is a 77 y/o M with chest pain/NSTEMI who went for urgent cath last night. He was found to have severe multi-vessel CAD. Awaiting definitive treatment plan. Getting tirofiban 6 hours post-cath then re-starting heparin drip. Baseline CBC ok. Renal function ok.   Goal of Therapy:  Heparin level 0.3-0.7 units/ml Monitor platelets by anticoagulation protocol: Yes   Plan:  Tirofiban off at 0630  Start heparin drip at 1000 units/hr at 0630 1400 heparin level F/U definitive treatment plan  Narda Bonds, PharmD, [place] Pharmacist Phone: [phone]

## 2022-05-03 NOTE — Progress Notes (Signed)
Came from Cath Lab with Nitro and Heparin drip infusing.R radial band in place with 11 cc of air per report.Site benign.Right radial pulse palpable.Alert and oriented. On 2 L [place].Sats >95% on the monitor.

## 2022-05-03 NOTE — Progress Notes (Signed)
[facility]                                                                               [name], is a 77 y.o. male, DOB - 1945/05/06, [id number] Admit date - 05/02/2022    Outpatient Primary MD for the patient is Velna Hatchet, MD  LOS - 1  days    Brief summary    [name] is a 77 y.o. 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  Echocardiogram ordered.    Hypertension Optimal BP parameters.  Continue with COREG 3.125 mg BID, Imdur 30 mg daily, Entresto 24-26 mg BID.   Hyperlipidemia -intolerant to statin; cardiology recommends discussion of Nexletol outpatient    Estimated body mass index is 28.28 kg/m as calculated from the following:   Height as of this encounter: '5\' 7"'$  (1.702 m).   Weight as of this encounter: 81.9 kg.  Code Status: Full code.  DVT Prophylaxis:  IV heparin.    Level of Care: Level of care: Progressive Family Communication:   Disposition Plan:     Remains inpatient appropriate:  CABG NEXT WEEK.   Procedures:  Cardiac catheterization  Consultants:   Cardiothoracic surgery  Cardiology.   Antimicrobials:   Anti-infectives (From admission, onward)    None        Medications  Scheduled Meds:  carvedilol  3.125 mg Oral BID WC   Chlorhexidine Gluconate Cloth  6 each Topical Daily   empagliflozin  10 mg Oral  Daily   isosorbide mononitrate  30 mg Oral Daily   omega-3 acid ethyl esters  1 g Oral Daily   pantoprazole  40 mg Oral Daily   sacubitril-valsartan  1 tablet Oral BID   sodium chloride flush  3 mL Intravenous Q12H   sodium chloride flush  3 mL Intravenous Q12H   Continuous Infusions:  sodium chloride     [START ON 05/04/2022] sodium chloride     sodium chloride     heparin 1,000 Units/hr (05/03/22 0635)   PRN Meds:.sodium chloride, sodium chloride, acetaminophen, nitroGLYCERIN, ondansetron (ZOFRAN) IV, sodium chloride flush, sodium chloride flush    Subjective:   [name] was seen and examined today.  No chest pain , no sob or nausea, vomiting.   Objective:   Vitals:   05/03/22 1400 05/03/22 1415 05/03/22 1430 05/03/22 1445  BP: 105/62     Pulse: 100 95 98 97  Resp: 17 20 (!) 22 (!) 23  Temp:      TempSrc:      SpO2: 93% 91% 93% 93%  Weight:  Height:        Intake/Output Summary (Last 24 hours) at 05/03/2022 1635 Last data filed at 05/03/2022 1538 Gross per 24 hour  Intake 689.86 ml  Output 1900 ml  Net -1210.14 ml   Filed Weights   05/02/22 1419 05/03/22 0024 05/03/22 0600  Weight: 83.5 kg 81.9 kg 81.9 kg     Exam General: Alert and oriented x 3, NAD Cardiovascular: S1 S2 auscultated, no murmurs, RRR Respiratory: Clear to auscultation bilaterally, no wheezing, rales or rhonchi Gastrointestinal: Soft, nontender, nondistended, + bowel sounds Ext: no pedal edema bilaterally Neuro: AAOx3, Cr N's II- XII. Strength 5/5 upper and lower extremities bilaterally Skin: No rashes Psych: Normal affect and demeanor, alert and oriented x3    Data Reviewed:  I have personally reviewed following labs and imaging studies   CBC Lab Results  Component Value Date   WBC 6.0 05/03/2022   RBC 4.10 (L) 05/03/2022   HGB 12.9 (L) 05/03/2022   HCT 37.5 (L) 05/03/2022   MCV 91.5 05/03/2022   MCH 31.5 05/03/2022   PLT 223 05/03/2022   MCHC 34.4 05/03/2022   RDW 15.0  05/03/2022   LYMPHSABS 1.6 05/02/2022   MONOABS 0.4 05/02/2022   EOSABS 0.2 05/02/2022   BASOSABS 0.0 [id number]     Last metabolic panel Lab Results  Component Value Date   NA 138 05/02/2022   K 4.3 05/02/2022   CL 105 05/02/2022   CO2 19 (L) 05/02/2022   BUN 17 05/02/2022   CREATININE 1.20 05/02/2022   GLUCOSE 129 (H) 05/02/2022   GFRNONAA >60 05/02/2022   GFRAA >60 08/11/2019   CALCIUM 9.1 05/02/2022   PROT 6.3 (L) 05/02/2022   ALBUMIN 3.6 05/02/2022   LABGLOB 3.2 06/16/2019   AGRATIO 1.5 06/16/2019   BILITOT 0.8 05/02/2022   ALKPHOS 40 05/02/2022   AST 39 05/02/2022   ALT 43 05/02/2022   ANIONGAP 14 05/02/2022    CBG (last 3)  Recent Labs    05/03/22 0023  GLUCAP 113*      Coagulation Profile: No results for input(s): "INR", "PROTIME" in the last 168 hours.   Radiology Studies: ECHOCARDIOGRAM COMPLETE  Result Date: 05/03/2022    ECHOCARDIOGRAM REPORT   Patient Name:   [name] Date of Exam: 05/03/2022 Medical Rec #:  [id number]      Height:       67.0 in Accession #:    [id number]     Weight:       180.6 lb Date of Birth:  08-Dec-1945      BSA:          1.936 m Patient Age:    36 years       BP:           147/66 mmHg Patient Gender: M              HR:           72 bpm. Exam Location:  Inpatient Procedure: 2D Echo, Cardiac Doppler, Color Doppler and Intracardiac            Opacification Agent Indications:    CAD Native Vessel I25.10  History:        Patient has prior history of Echocardiogram examinations, most                 recent 01/20/2022. Cardiomyopathy, NSTEMI, Previous Myocardial                 Infarction and CAD; Risk Factors:Hypertension and  Former Smoker.  Sonographer:    Greer Pickerel Referring Phys: 59 DAVID W HARDING  Sonographer Comments: Suboptimal parasternal window. Image acquisition challenging due to patient body habitus and Image acquisition challenging due to respiratory motion. IMPRESSIONS  1. 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  There is no visualized apical thrombus. . Left ventricular ejection fraction, by estimation, is 50%. The left ventricle has normal function. Left ventricular endocardial border not optimally defined to evaluate regional wall motion. Left ventricular diastolic parameters are consistent with Grade I diastolic dysfunction (impaired relaxation).  2. Right ventricular systolic function is normal. The right ventricular size is normal. Tricuspid regurgitation signal is inadequate for assessing PA pressure.  3. The mitral valve is normal in structure. Trivial mitral valve regurgitation. Mild to moderate mitral stenosis.  4. The aortic valve has an indeterminant number of cusps. Aortic valve regurgitation is moderate. No aortic stenosis is present.  5. Aortic dilatation noted. 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  6. The inferior vena cava is normal in size with <50% respiratory variability, suggesting right atrial pressure of 8 mmHg. 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 There is no visualized apical thrombus. Left ventricular ejection fraction, by estimation, is 50%. The left ventricle has normal function. Left ventricular endocardial border not optimally defined to evaluate regional wall motion. Definity contrast agent was given IV to delineate the left ventricular endocardial borders. The left ventricular internal cavity size was normal in size. There is no left ventricular hypertrophy. Left ventricular  diastolic parameters are consistent with Grade I diastolic dysfunction (impaired relaxation). Normal left ventricular filling pressure. Right Ventricle: The right ventricular size is normal. Right vetricular wall thickness was not well visualized. Right ventricular systolic function is normal. Tricuspid regurgitation signal is inadequate for assessing PA pressure. Left Atrium: Left atrial size was normal in size. Right Atrium: Right atrial size was normal in  size. Pericardium: There is no evidence of pericardial effusion. Mitral Valve: The mitral valve is normal in structure. There is mild thickening of the mitral valve leaflet(s). There is mild calcification of the mitral valve leaflet(s). Mild mitral annular calcification. Trivial mitral valve regurgitation. Mild to moderate mitral valve stenosis. MV peak gradient, 13.8 mmHg. The mean mitral valve gradient is 6.0 mmHg. Tricuspid Valve: The tricuspid valve is normal in structure. Tricuspid valve regurgitation is not demonstrated. No evidence of tricuspid stenosis. Aortic Valve: The aortic valve has an indeterminant number of cusps. Aortic valve regurgitation is moderate. Aortic regurgitation PHT measures 759 msec. No aortic stenosis is present. Aortic valve mean gradient measures 4.9 mmHg. Aortic valve peak gradient measures 11.3 mmHg. Aortic valve area, by VTI measures 2.19 cm. Pulmonic Valve: The pulmonic valve was not well visualized. Pulmonic valve regurgitation is not visualized. No evidence of pulmonic stenosis. Aorta: The aortic root is normal in size and structure and aortic dilatation noted. 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 Venous: The inferior vena cava is normal in size with less than 50% respiratory variability, suggesting right atrial pressure of 8 mmHg. IAS/Shunts: No atrial level shunt detected by color flow Doppler.  LEFT VENTRICLE PLAX 2D LVIDd:         3.90 cm   Diastology LVIDs:         2.70 cm   LV e' medial:    4.90 cm/s LV PW:         1.00 cm   LV E/e' medial:  19.7 LV IVS:        0.90 cm   LV e' lateral:   7.29 cm/s LVOT diam:     1.90 cm   LV E/e' lateral: 13.3 LV SV:         65 LV SV Index:   34 LVOT Area:     2.84 cm  RIGHT VENTRICLE RV S prime:     9.60 cm/s TAPSE (M-mode): 2.2 cm LEFT ATRIUM             Index        RIGHT ATRIUM           Index LA diam:        3.20 cm 1.65 cm/m   RA Area:     18.55 cm LA Vol (A2C):   72.3 ml 37.34 ml/m  RA Volume:    52.20 ml  26.96 ml/m LA Vol (A4C):   53.0 ml 27.37 ml/m LA Biplane Vol: 62.9 ml 32.49 ml/m  AORTIC VALVE                     PULMONIC VALVE AV Area (Vmax):    1.83 cm      PR End Diast Vel: 3.11 msec AV Area (Vmean):   2.07 cm AV Area (VTI):     2.19 cm AV Vmax:           168.44 cm/s AV Vmean:          100.239 cm/s AV VTI:            0.296 m AV Peak Grad:      11.3 mmHg AV Mean Grad:      4.9 mmHg LVOT Vmax:         109.00 cm/s LVOT Vmean:        73.200 cm/s LVOT VTI:          0.229 m LVOT/AV VTI ratio: 0.77 AI PHT:            759 msec  AORTA Ao Root diam: 3.80 cm Ao Asc diam:  3.70 cm MITRAL VALVE MV Area (PHT): 3.39 cm     SHUNTS MV Area VTI:   1.11 cm     Systemic VTI:  0.23 m MV Peak grad:  13.8 mmHg    Systemic Diam: 1.90 cm MV Mean grad:  6.0 mmHg MV Vmax:       1.86 m/s MV Vmean:      109.0 cm/s MV Decel Time: 224 msec MV E velocity: 96.60 cm/s MV A velocity: 150.00 cm/s MV E/A ratio:  0.64 Carlyle Dolly MD Electronically signed by Carlyle Dolly MD Signature Date/Time: 05/03/2022/2:30:26 PM    Final    CARDIAC CATHETERIZATION  Result Date: 05/03/2022   Prox LAD lesion is 75% stenosed - just prior to stent   Previously placed Mid LAD to Dist LAD stent of unknown type is  widely patent.   Prox Cx to Mid Cx lesion is 65% stenosed -just proximal to prior stent   Previously placed Prox RCA to Mid RCA stent of unknown type is  widely patent.   Previously placed Mid Cx to Dist Cx stent is 5% stenosed with 90% stenosed side branch in 2nd Mrg.   Mid RCA lesion is 80% stenosed - just distal to stent.  Mid RCA to Dist RCA lesion is 65% stenosed.  Dist RCA lesion is 80% stenosed.   -----------------------------------------------   There is moderate left ventricular systolic dysfunction.  The  left ventricular ejection fraction is 35-45% by visual estimate.   LV end diastolic pressure is severely elevated.   There is no aortic valve stenosis. POST-OP DIAGNOSES Severe Multi-Vessel CAD: Prox LAD 75%@ SP1 (just  prior to extensive stented segment) Prox-mid LCx ~65% (prox stent edge lesion) with progression of jailed OM2 lesion to 90% (likely Culprit) Patent Prox to mid RCA stents with sequential focal lesions 80%-60-70% lesions & 80% Known to be moderate Ischemic CM - EG ~ 40% (unable to assess regional wall motion abnormalities due to poor filling. Recommend 2D echo to better assess) PLAN Unsure what the best option here is.  Clearly all the lesion segments are treatable with PCI with exception of potentially the OM branch which is jailed.  Complete revascularization with CABG is also an option.  At this point, the best plan is to temporize medically and discussed with interventional colleagues as well as CVTS consultation (will need to be called in AM by rounding MD). Treat with a short course of Aggrastat (6 hours) for potential thrombotic disease in OM2. RCA and LAD given his ongoing pain. Restart heparin 6 to 8 hours after sheath removal which would be after Aggrastat completion. Hold clopidogrel Check 2D echo to better assess EF Continue IV nitroglycerin infusion and will hold Entresto for now defer to rounding team based on blood pressure to determine whether will be restarted.  We will continue carvedilol. Glenetta Hew  DG Chest 2 View  Result Date: 05/02/2022 CLINICAL DATA:  Chest pain. EXAM: CHEST - 2 VIEW COMPARISON:  August 08, 2019. FINDINGS: The heart size and mediastinal contours are within normal limits. Both lungs are clear. The visualized skeletal structures are unremarkable. IMPRESSION: No active cardiopulmonary disease. Electronically Signed   By: Marijo Conception M.D.   On: 05/02/2022 15:39       Hosie Poisson M.D. [facility] 05/03/2022, 4:35 PM  Available via Epic secure chat 7am-7pm After 7 pm, please refer to night coverage provider listed on amion.

## 2022-05-03 NOTE — Progress Notes (Signed)
  Echocardiogram 2D Echocardiogram has been performed.  [name] 05/03/2022, 10:48 AM

## 2022-05-04 DIAGNOSIS — I214 Non-ST elevation (NSTEMI) myocardial infarction: Secondary | ICD-10-CM | POA: Diagnosis not present

## 2022-05-04 DIAGNOSIS — I255 Ischemic cardiomyopathy: Secondary | ICD-10-CM | POA: Diagnosis not present

## 2022-05-04 DIAGNOSIS — I10 Essential (primary) hypertension: Secondary | ICD-10-CM | POA: Diagnosis not present

## 2022-05-04 DIAGNOSIS — E785 Hyperlipidemia, unspecified: Secondary | ICD-10-CM | POA: Diagnosis not present

## 2022-05-04 DIAGNOSIS — I5021 Acute systolic (congestive) heart failure: Secondary | ICD-10-CM | POA: Diagnosis not present

## 2022-05-04 LAB — CBC
HCT: 36.6 % — ABNORMAL LOW (ref 39.0–52.0)
Hemoglobin: 12.8 g/dL — ABNORMAL LOW (ref 13.0–17.0)
MCH: 32.4 pg (ref 26.0–34.0)
MCHC: 35 g/dL (ref 30.0–36.0)
MCV: 92.7 fL (ref 80.0–100.0)
Platelets: 219 10*3/uL (ref 150–400)
RBC: 3.95 MIL/uL — ABNORMAL LOW (ref 4.22–5.81)
RDW: 15.3 % (ref 11.5–15.5)
WBC: 6.4 10*3/uL (ref 4.0–10.5)
nRBC: 0 % (ref 0.0–0.2)

## 2022-05-04 LAB — BASIC METABOLIC PANEL
Anion gap: 13 (ref 5–15)
BUN: 18 mg/dL (ref 8–23)
CO2: 21 mmol/L — ABNORMAL LOW (ref 22–32)
Calcium: 9.1 mg/dL (ref 8.9–10.3)
Chloride: 102 mmol/L (ref 98–111)
Creatinine, Ser: 1.3 mg/dL — ABNORMAL HIGH (ref 0.61–1.24)
GFR, Estimated: 57 mL/min — ABNORMAL LOW (ref >60)
Glucose, Bld: 124 mg/dL — ABNORMAL HIGH (ref 70–99)
Potassium: 3.6 mmol/L (ref 3.5–5.1)
Sodium: 136 mmol/L (ref 135–145)

## 2022-05-04 LAB — HEPARIN LEVEL (UNFRACTIONATED)
Heparin Unfractionated: 0.35 IU/mL (ref 0.30–0.70)
Heparin Unfractionated: 0.35 IU/mL (ref 0.30–0.70)

## 2022-05-04 MED ORDER — POTASSIUM CHLORIDE CRYS ER 20 MEQ PO TBCR
20.0000 meq | EXTENDED_RELEASE_TABLET | Freq: Once | ORAL | Status: AC
  Administered 2022-05-04: 20 meq via ORAL
  Filled 2022-05-04: qty 1

## 2022-05-04 MED ORDER — POLYETHYLENE GLYCOL 3350 17 G PO PACK
17.0000 g | PACK | Freq: Every day | ORAL | Status: DC
  Administered 2022-05-04 – 2022-05-05 (×2): 17 g via ORAL
  Filled 2022-05-04 (×3): qty 1

## 2022-05-04 MED ORDER — EZETIMIBE 10 MG PO TABS
10.0000 mg | ORAL_TABLET | Freq: Every day | ORAL | Status: DC
  Administered 2022-05-04 – 2022-05-12 (×8): 10 mg via ORAL
  Filled 2022-05-04 (×8): qty 1

## 2022-05-04 NOTE — Progress Notes (Signed)
2 Days Post-Op Procedure(s) (LRB): LEFT HEART CATH AND CORONARY ANGIOGRAPHY (N/A) Subjective: No complaints, no CP or SOB  Objective: Vital signs in last 24 hours: Temp:  [98 F (36.7 C)-98.4 F (36.9 C)] 98 F (36.7 C) (03/10 0755) Pulse Rate:  [45-113] 62 (03/10 0910) Cardiac Rhythm: Normal sinus rhythm (03/09 1954) Resp:  [11-27] 15 (03/10 0910) BP: (90-135)/(47-79) 105/57 (03/10 0910) SpO2:  [86 %-96 %] 94 % (03/10 0910) Weight:  [84 kg] 84 kg (03/10 0600)  Hemodynamic parameters for last 24 hours:    Intake/Output from previous day: 03/09 0701 - 03/10 0700 In: 341.3 [I.V.:341.3] Out: 2350 [Urine:2350] Intake/Output this shift: Total I/O In: 245.4 [P.O.:200; I.V.:45.4] Out: 250 [Urine:250]  General appearance: alert, cooperative, and no distress Heart: regular rate and rhythm  Lab Results: Recent Labs    05/03/22 0147 05/04/22 0014  WBC 6.0 6.4  HGB 12.9* 12.8*  HCT 37.5* 36.6*  PLT 223 219   BMET:  Recent Labs    05/02/22 1514 05/04/22 0014  NA 138 136  K 4.3 3.6  CL 105 102  CO2 19* 21*  GLUCOSE 129* 124*  BUN 17 18  CREATININE 1.20 1.30*  CALCIUM 9.1 9.1    PT/INR: No results for input(s): "LABPROT", "INR" in the last 72 hours. ABG    Component Value Date/Time   TCO2 24 06/02/2007 2108   CBG (last 3)  Recent Labs    05/03/22 0023  GLUCAP 113*    Assessment/Plan: S/P Procedure(s) (LRB): LEFT HEART CATH AND CORONARY ANGIOGRAPHY (N/A) 3 vessel CAD Echo- EF 50%, small apical aneurysm, mod AI For CABG later this week after Plavix washout (Thurs or Fri)   LOS: 2 days    Melrose Nakayama 05/04/2022

## 2022-05-04 NOTE — Progress Notes (Signed)
[place] for Heparin (following 6 hours of Tirofiban) Indication: multi-vessel CAD s/p cath   Allergies  Allergen Reactions   Asa [Aspirin] Other (See Comments)    Abdominal bleeding   Penicillins Anaphylaxis    Has patient had a PCN reaction causing immediate rash, facial/tongue/throat swelling, SOB or lightheadedness with hypotension: Yes Has patient had a PCN reaction causing severe rash involving mucus membranes or skin necrosis: No Has patient had a PCN reaction that required hospitalization: No Has patient had a PCN reaction occurring within the last 10 years: No If all of the above answers are "NO", then may proceed with Cephalosporin use.   Statins Other (See Comments)    Stiff joints, muscle tightness, couldn't walk    Patient Measurements: Height: '5\' 7"'$  (170.2 cm) Weight: 81.9 kg (180 lb 8.9 oz) IBW/kg (Calculated) : 66.1 Heparin DQ: 82 kg  Vital Signs: Temp: 98.2 F (36.8 C) (03/09 1959) Temp Source: Oral (03/09 1959) BP: 116/50 (03/10 0100) Pulse Rate: 45 (03/10 0100)  Labs: Recent Labs    05/02/22 1514 05/02/22 1714 05/02/22 2117 05/03/22 0147 05/03/22 1428 05/04/22 0014  HGB 12.9*  --   --  12.9*  --  12.8*  HCT 39.2  --   --  37.5*  --  36.6*  PLT 243  --   --  223  --  219  HEPARINUNFRC  --   --   --   --  0.15* 0.35  CREATININE 1.20  --   --   --   --  1.30*  TROPONINIHS 214* 5,099* 8,341* 8,349*  --   --      Estimated Creatinine Clearance: 49.5 mL/min (A) (by C-G formula based on SCr of 1.3 mg/dL (H)).   Medical History: Past Medical History:  Diagnosis Date   Anxiety    Arthritis    "wrists" (07/27/2014)   Coronary artery disease    a.  Pt with 4 stents over the course of 2003-2012;  b.  LHC 6/16:  LAD, RCA, LCx stents patent, OM1 50%, EF 50-55% >> med rx    Elevated LFTs    GERD (gastroesophageal reflux disease)    Hypercholesterolemia    Hypertension    Ischemic cardiomyopathy    cMRI 03/2011:   EF 48% with dist ant, septal, apical and inf-apical dyskinesia, no apical clot, dist ant, apical and septal full thickness scar >> No ICD needed  //  Limited echo 7/19: Large apical septal aneurysm, severe hypokinesis of the entire apex and mid to apical segments of the anterior wall, anterolateral wall and anterior septum-consistent with infarct and most of the LAD territory; EF 35    Left ventricular aneurysm    Limited echo 7/19: Large apical septal aneurysm, severe hypokinesis of the entire apex and mid to apical segments of the anterior wall, anterolateral wall and anterior septum-consistent with infarct and most of the LAD territory; EF 35   Myocardial infarction [facility]) 2003    Assessment: [name] is a 77 y/o M with chest pain/NSTEMI who went for urgent cath last night. He was found to have severe multi-vessel CAD. Now s/p tirofiban x 6 hours post-cath then re-starting heparin drip. Baseline CBC ok. Renal function ok. Planning CABG after 5 day Plavix washout.  3/10 AM update:  Heparin level therapeutic after rate increase  Goal of Therapy:  Heparin level 0.3-0.7 units/ml Monitor platelets by anticoagulation protocol: Yes   Plan:  Cont heparin 1250 units/hr 1000 heparin level  Narda Bonds, PharmD, BCPS Clinical Pharmacist Phone: [phone]

## 2022-05-04 NOTE — Progress Notes (Addendum)
[place] for Heparin (following 6 hours of Tirofiban) Indication: multi-vessel CAD s/p cath, awaiting CABG  Allergies  Allergen Reactions   Asa [Aspirin] Other (See Comments)    Abdominal bleeding   Penicillins Anaphylaxis    Has patient had a PCN reaction causing immediate rash, facial/tongue/throat swelling, SOB or lightheadedness with hypotension: Yes Has patient had a PCN reaction causing severe rash involving mucus membranes or skin necrosis: No Has patient had a PCN reaction that required hospitalization: No Has patient had a PCN reaction occurring within the last 10 years: No If all of the above answers are "NO", then may proceed with Cephalosporin use.   Statins Other (See Comments)    Stiff joints, muscle tightness, couldn't walk    Patient Measurements: Height: '5\' 7"'$  (170.2 cm) Weight: 84 kg (185 lb 3 oz) IBW/kg (Calculated) : 66.1 Heparin DQ: 82 kg  Vital Signs: Temp: 98 F (36.7 C) (03/10 1130) Temp Source: Oral (03/10 1130) BP: 105/57 (03/10 0910) Pulse Rate: 62 (03/10 0910)  Labs: Recent Labs    05/02/22 1514 05/02/22 1714 05/02/22 2117 05/03/22 0147 05/03/22 1428 05/04/22 0014 05/04/22 0949  HGB 12.9*  --   --  12.9*  --  12.8*  --   HCT 39.2  --   --  37.5*  --  36.6*  --   PLT 243  --   --  223  --  219  --   HEPARINUNFRC  --   --   --   --  0.15* 0.35 0.35  CREATININE 1.20  --   --   --   --  1.30*  --   TROPONINIHS 214* 5,099* 8,341* 8,349*  --   --   --      Estimated Creatinine Clearance: 50.1 mL/min (A) (by C-G formula based on SCr of 1.3 mg/dL (H)).   Medical History: Past Medical History:  Diagnosis Date   Anxiety    Arthritis    "wrists" (07/27/2014)   Coronary artery disease    a.  Pt with 4 stents over the course of 2003-2012;  b.  LHC 6/16:  LAD, RCA, LCx stents patent, OM1 50%, EF 50-55% >> med rx    Elevated LFTs    GERD (gastroesophageal reflux disease)    Hypercholesterolemia     Hypertension    Ischemic cardiomyopathy    cMRI 03/2011:  EF 48% with dist ant, septal, apical and inf-apical dyskinesia, no apical clot, dist ant, apical and septal full thickness scar >> No ICD needed  //  Limited echo 7/19: Large apical septal aneurysm, severe hypokinesis of the entire apex and mid to apical segments of the anterior wall, anterolateral wall and anterior septum-consistent with infarct and most of the LAD territory; EF 35    Left ventricular aneurysm    Limited echo 7/19: Large apical septal aneurysm, severe hypokinesis of the entire apex and mid to apical segments of the anterior wall, anterolateral wall and anterior septum-consistent with infarct and most of the LAD territory; EF 35   Myocardial infarction [facility]) 2003    Assessment: [name] is a 77 y/o M with chest pain/NSTEMI who went for urgent cath last night. He was found to have severe multi-vessel CAD. Now s/p tirofiban x 6 hours post-cath then re-starting heparin drip. Baseline CBC ok. Renal function ok. Planning CABG after 5 day Plavix washout.  Heparin level this AM therapeutic on recheck at 0.35. CBC is stable.  Goal of Therapy:  Heparin level 0.3-0.7 units/ml Monitor platelets by anticoagulation protocol: Yes   Plan:  Cont heparin 1250 units/hr Daily heparin level and CBC  Thank you for involving pharmacy in this patient's care.  Reatha Harps, PharmD PGY2 Pharmacy Resident 05/04/2022 12:53 PM

## 2022-05-04 NOTE — Care Management (Signed)
  Transition of Care [facility]) Screening Note   Patient Details  Name: [name] Date of Birth: Mar 01, 1945   Transition of Care [facility]) CM/SW Contact:    Bethena Roys, RN Phone Number: 05/04/2022, 9:50 AM    Transition of Care Department [facility]) has reviewed the patient and no TOC needs have been identified at this time. Patient presented for chest pain-CVTS following. We will continue to monitor patient advancement through interdisciplinary progression rounds. If new patient transition needs arise, please place a TOC consult.

## 2022-05-04 NOTE — Progress Notes (Signed)
Cardiology Progress Note  Patient ID: Mansa Stamatis MRN: [id number] DOB: [birth year] Date of Encounter: 05/04/2022  Primary Cardiologist: Jenkins Rouge, MD  Subjective   Chief Complaint: None.   HPI: No further chest pain.  Good diuresis.  Euvolemic on exam.  Stable for transfer to floor.  CABG later this week.  ROS:  All other ROS reviewed and negative. Pertinent positives noted in the HPI.     Inpatient Medications  Scheduled Meds:  carvedilol  3.125 mg Oral BID WC   Chlorhexidine Gluconate Cloth  6 each Topical Daily   empagliflozin  10 mg Oral Daily   isosorbide mononitrate  30 mg Oral Daily   omega-3 acid ethyl esters  1 g Oral Daily   pantoprazole  40 mg Oral Daily   polyethylene glycol  17 g Oral Daily   sacubitril-valsartan  1 tablet Oral BID   sodium chloride flush  3 mL Intravenous Q12H   sodium chloride flush  3 mL Intravenous Q12H   Continuous Infusions:  sodium chloride     sodium chloride 10 mL/hr at 05/04/22 0801   sodium chloride     heparin 1,250 Units/hr (05/04/22 0801)   PRN Meds: sodium chloride, sodium chloride, acetaminophen, nitroGLYCERIN, ondansetron (ZOFRAN) IV, sodium chloride flush, sodium chloride flush   Vital Signs   Vitals:   05/04/22 0500 05/04/22 0600 05/04/22 0755 05/04/22 0810  BP: (!) 135/47 112/70  100/67  Pulse: (!) 45 (!) 56  66  Resp: '18 11  15  '$ Temp:   98 F (36.7 C)   TempSrc:   Oral   SpO2: 96% 95%  94%  Weight:  84 kg    Height:        Intake/Output Summary (Last 24 hours) at 05/04/2022 1006 Last data filed at 05/04/2022 0900 Gross per 24 hour  Intake 586.69 ml  Output 2600 ml  Net -2013.31 ml      05/04/2022    6:00 AM 05/03/2022    6:00 AM 05/03/2022   12:24 AM  Last 3 Weights  Weight (lbs) 185 lb 3 oz 180 lb 8.9 oz 180 lb 8.9 oz  Weight (kg) 84 kg 81.9 kg 81.9 kg      Telemetry  Overnight telemetry shows sinus bradycardia 40 to 50 bpm, which I personally reviewed.    Physical Exam   Vitals:   05/04/22  0500 05/04/22 0600 05/04/22 0755 05/04/22 0810  BP: (!) 135/47 112/70  100/67  Pulse: (!) 45 (!) 56  66  Resp: '18 11  15  '$ Temp:   98 F (36.7 C)   TempSrc:   Oral   SpO2: 96% 95%  94%  Weight:  84 kg    Height:        Intake/Output Summary (Last 24 hours) at 05/04/2022 1006 Last data filed at 05/04/2022 0900 Gross per 24 hour  Intake 586.69 ml  Output 2600 ml  Net -2013.31 ml       05/04/2022    6:00 AM 05/03/2022    6:00 AM 05/03/2022   12:24 AM  Last 3 Weights  Weight (lbs) 185 lb 3 oz 180 lb 8.9 oz 180 lb 8.9 oz  Weight (kg) 84 kg 81.9 kg 81.9 kg    Body mass index is 29 kg/m.  General: Well nourished, well developed, in no acute distress Head: Atraumatic, normal size  Eyes: PEERLA, EOMI  Neck: Supple, no JVD Endocrine: No thryomegaly Cardiac: Normal S1, S2; RRR; no murmurs, rubs, or gallops Lungs: Clear to  auscultation bilaterally, no wheezing, rhonchi or rales  Abd: Soft, nontender, no hepatomegaly  Ext: No edema, pulses 2+ Musculoskeletal: No deformities, BUE and BLE strength normal and equal Skin: Warm and dry, no rashes   Neuro: Alert and oriented to person, place, time, and situation, CNII-XII grossly intact, no focal deficits  Psych: Normal mood and affect   Labs  High Sensitivity Troponin:   Recent Labs  Lab 05/02/22 1514 05/02/22 1714 05/02/22 2117 05/03/22 0147  TROPONINIHS 214* 5,099* 8,341* 8,349*     Cardiac EnzymesNo results for input(s): "TROPONINI" in the last 168 hours. No results for input(s): "TROPIPOC" in the last 168 hours.  Chemistry Recent Labs  Lab 05/02/22 1514 05/04/22 0014  NA 138 136  K 4.3 3.6  CL 105 102  CO2 19* 21*  GLUCOSE 129* 124*  BUN 17 18  CREATININE 1.20 1.30*  CALCIUM 9.1 9.1  PROT 6.3*  --   ALBUMIN 3.6  --   AST 39  --   ALT 43  --   ALKPHOS 40  --   BILITOT 0.8  --   GFRNONAA >60 57*  ANIONGAP 14 13    Hematology Recent Labs  Lab 05/02/22 1514 05/03/22 0147 05/04/22 0014  WBC 7.1 6.0 6.4  RBC  4.14* 4.10* 3.95*  HGB 12.9* 12.9* 12.8*  HCT 39.2 37.5* 36.6*  MCV 94.7 91.5 92.7  MCH 31.2 31.5 32.4  MCHC 32.9 34.4 35.0  RDW 15.1 15.0 15.3  PLT 243 223 219   BNPNo results for input(s): "BNP", "PROBNP" in the last 168 hours.  DDimer No results for input(s): "DDIMER" in the last 168 hours.   Radiology  ECHOCARDIOGRAM COMPLETE  Result Date: 05/03/2022    ECHOCARDIOGRAM REPORT   Patient Name:   VIDYUT PETRIN Date of Exam: 05/03/2022 Medical Rec #:  [id number]      Height:       67.0 in Accession #:    [id number]     Weight:       180.6 lb Date of Birth:  05-Apr-1945      BSA:          1.936 m Patient Age:    77 years       BP:           147/66 mmHg Patient Gender: M              HR:           72 bpm. Exam Location:  Inpatient Procedure: 2D Echo, Cardiac Doppler, Color Doppler and Intracardiac            Opacification Agent Indications:    CAD Native Vessel I25.10  History:        Patient has prior history of Echocardiogram examinations, most                 recent 01/20/2022. Cardiomyopathy, NSTEMI, Previous Myocardial                 Infarction and CAD; Risk Factors:Hypertension and Former Smoker.  Sonographer:    Greer Pickerel Referring Phys: [id number] DAVID W HARDING  Sonographer Comments: Suboptimal parasternal window. Image acquisition challenging due to patient body habitus and Image acquisition challenging due to respiratory motion. IMPRESSIONS  1. 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 There is no visualized apical thrombus. . Left ventricular ejection fraction, by estimation, is 50%. The left ventricle has normal function. Left ventricular endocardial border not optimally defined to evaluate regional wall motion. Left  ventricular diastolic parameters are consistent with Grade I diastolic dysfunction (impaired relaxation).  2. Right ventricular systolic function is normal. The right ventricular size is normal. Tricuspid regurgitation signal is inadequate for assessing PA pressure.  3. The mitral  valve is normal in structure. Trivial mitral valve regurgitation. Mild to moderate mitral stenosis.  4. The aortic valve has an indeterminant number of cusps. Aortic valve regurgitation is moderate. No aortic stenosis is present.  5. Aortic dilatation noted. 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  6. The inferior vena cava is normal in size with <50% respiratory variability, suggesting right atrial pressure of 8 mmHg. 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 There is no visualized apical thrombus. Left ventricular ejection fraction, by estimation, is 50%. The left ventricle has normal function. Left ventricular endocardial border not optimally defined to evaluate regional wall motion. Definity contrast agent was given IV to delineate the left ventricular endocardial borders. The left ventricular internal cavity size was normal in size. There is no left ventricular hypertrophy. Left ventricular  diastolic parameters are consistent with Grade I diastolic dysfunction (impaired relaxation). Normal left ventricular filling pressure. Right Ventricle: The right ventricular size is normal. Right vetricular wall thickness was not well visualized. Right ventricular systolic function is normal. Tricuspid regurgitation signal is inadequate for assessing PA pressure. Left Atrium: Left atrial size was normal in size. Right Atrium: Right atrial size was normal in size. Pericardium: There is no evidence of pericardial effusion. Mitral Valve: The mitral valve is normal in structure. There is mild thickening of the mitral valve leaflet(s). There is mild calcification of the mitral valve leaflet(s). Mild mitral annular calcification. Trivial mitral valve regurgitation. Mild to moderate mitral valve stenosis. MV peak gradient, 13.8 mmHg. The mean mitral valve gradient is 6.0 mmHg. Tricuspid Valve: The tricuspid valve is normal in structure. Tricuspid valve regurgitation is not  demonstrated. No evidence of tricuspid stenosis. Aortic Valve: The aortic valve has an indeterminant number of cusps. Aortic valve regurgitation is moderate. Aortic regurgitation PHT measures 759 msec. No aortic stenosis is present. Aortic valve mean gradient measures 4.9 mmHg. Aortic valve peak gradient measures 11.3 mmHg. Aortic valve area, by VTI measures 2.19 cm. Pulmonic Valve: The pulmonic valve was not well visualized. Pulmonic valve regurgitation is not visualized. No evidence of pulmonic stenosis. Aorta: The aortic root is normal in size and structure and aortic dilatation noted. 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 Venous: The inferior vena cava is normal in size with less than 50% respiratory variability, suggesting right atrial pressure of 8 mmHg. IAS/Shunts: No atrial level shunt detected by color flow Doppler.  LEFT VENTRICLE PLAX 2D LVIDd:         3.90 cm   Diastology LVIDs:         2.70 cm   LV e' medial:    4.90 cm/s LV PW:         1.00 cm   LV E/e' medial:  19.7 LV IVS:        0.90 cm   LV e' lateral:   7.29 cm/s LVOT diam:     1.90 cm   LV E/e' lateral: 13.3 LV SV:         65 LV SV Index:   34 LVOT Area:     2.84 cm  RIGHT VENTRICLE RV S prime:     9.60 cm/s TAPSE (M-mode): 2.2 cm LEFT ATRIUM  Index        RIGHT ATRIUM           Index LA diam:        3.20 cm 1.65 cm/m   RA Area:     18.55 cm LA Vol (A2C):   72.3 ml 37.34 ml/m  RA Volume:   52.20 ml  26.96 ml/m LA Vol (A4C):   53.0 ml 27.37 ml/m LA Biplane Vol: 62.9 ml 32.49 ml/m  AORTIC VALVE                     PULMONIC VALVE AV Area (Vmax):    1.83 cm      PR End Diast Vel: 3.11 msec AV Area (Vmean):   2.07 cm AV Area (VTI):     2.19 cm AV Vmax:           168.44 cm/s AV Vmean:          100.239 cm/s AV VTI:            0.296 m AV Peak Grad:      11.3 mmHg AV Mean Grad:      4.9 mmHg LVOT Vmax:         109.00 cm/s LVOT Vmean:        73.200 cm/s LVOT VTI:          0.229 m LVOT/AV VTI ratio: 0.77 AI  PHT:            759 msec  AORTA Ao Root diam: 3.80 cm Ao Asc diam:  3.70 cm MITRAL VALVE MV Area (PHT): 3.39 cm     SHUNTS MV Area VTI:   1.11 cm     Systemic VTI:  0.23 m MV Peak grad:  13.8 mmHg    Systemic Diam: 1.90 cm MV Mean grad:  6.0 mmHg MV Vmax:       1.86 m/s MV Vmean:      109.0 cm/s MV Decel Time: 224 msec MV E velocity: 96.60 cm/s MV A velocity: 150.00 cm/s MV E/A ratio:  0.64 Carlyle Dolly MD Electronically signed by Carlyle Dolly MD Signature Date/Time: 05/03/2022/2:30:26 PM    Final    CARDIAC CATHETERIZATION  Result Date: 05/03/2022   Prox LAD lesion is 75% stenosed - just prior to stent   Previously placed Mid LAD to Dist LAD stent of unknown type is  widely patent.   Prox Cx to Mid Cx lesion is 65% stenosed -just proximal to prior stent   Previously placed Prox RCA to Mid RCA stent of unknown type is  widely patent.   Previously placed Mid Cx to Dist Cx stent is 5% stenosed with 90% stenosed side branch in 2nd Mrg.   Mid RCA lesion is 80% stenosed - just distal to stent.  Mid RCA to Dist RCA lesion is 65% stenosed.  Dist RCA lesion is 80% stenosed.   -----------------------------------------------   There is moderate left ventricular systolic dysfunction.  The left ventricular ejection fraction is 35-45% by visual estimate.   LV end diastolic pressure is severely elevated.   There is no aortic valve stenosis. POST-OP DIAGNOSES Severe Multi-Vessel CAD: Prox LAD 75%@ SP1 (just prior to extensive stented segment) Prox-mid LCx ~65% (prox stent edge lesion) with progression of jailed OM2 lesion to 90% (likely Culprit) Patent Prox to mid RCA stents with sequential focal lesions 80%-60-70% lesions & 80% Known to be moderate Ischemic CM - EG ~ 40% (unable to assess regional wall motion abnormalities due  to poor filling. Recommend 2D echo to better assess) PLAN Unsure what the best option here is.  Clearly all the lesion segments are treatable with PCI with exception of potentially the OM branch  which is jailed.  Complete revascularization with CABG is also an option.  At this point, the best plan is to temporize medically and discussed with interventional colleagues as well as CVTS consultation (will need to be called in AM by rounding MD). Treat with a short course of Aggrastat (6 hours) for potential thrombotic disease in OM2. RCA and LAD given his ongoing pain. Restart heparin 6 to 8 hours after sheath removal which would be after Aggrastat completion. Hold clopidogrel Check 2D echo to better assess EF Continue IV nitroglycerin infusion and will hold Entresto for now defer to rounding team based on blood pressure to determine whether will be restarted.  We will continue carvedilol. Glenetta Hew  DG Chest 2 View  Result Date: 05/02/2022 CLINICAL DATA:  Chest pain. EXAM: CHEST - 2 VIEW COMPARISON:  August 08, 2019. FINDINGS: The heart size and mediastinal contours are within normal limits. Both lungs are clear. The visualized skeletal structures are unremarkable. IMPRESSION: No active cardiopulmonary disease. Electronically Signed   By: Marijo Conception M.D.   On: 05/02/2022 15:39    Cardiac Studies  TTE 05/03/2022  1. 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 There is no visualized  apical thrombus. . Left ventricular ejection fraction, by estimation, is  50%. The left ventricle has normal function. Left ventricular endocardial  border not optimally defined to  evaluate regional wall motion. Left ventricular diastolic parameters are  consistent with Grade I diastolic dysfunction (impaired relaxation).   2. Right ventricular systolic function is normal. The right ventricular  size is normal. Tricuspid regurgitation signal is inadequate for assessing  PA pressure.   3. The mitral valve is normal in structure. Trivial mitral valve  regurgitation. Mild to moderate mitral stenosis.   4. The aortic valve has an indeterminant number of cusps. Aortic valve  regurgitation is moderate. No aortic  stenosis is present.   5. Aortic dilatation noted. There is mild dilatation of the ascending  aorta, measuring 37 mm.   6. The inferior vena cava is normal in size with <50% respiratory  variability, suggesting right atrial pressure of 8 mmHg.   LHC 05/02/2022   Prox LAD lesion is 75% stenosed - just prior to stent   Previously placed Mid LAD to Dist LAD stent of unknown type is  widely patent.   Prox Cx to Mid Cx lesion is 65% stenosed -just proximal to prior stent   Previously placed Prox RCA to Mid RCA stent of unknown type is  widely patent.   Previously placed Mid Cx to Dist Cx stent is 5% stenosed with 90% stenosed side branch in 2nd Mrg.   Mid RCA lesion is 80% stenosed - just distal to stent.  Mid RCA to Dist RCA lesion is 65% stenosed.  Dist RCA lesion is 80% stenosed.   -----------------------------------------------   There is moderate left ventricular systolic dysfunction.  The left ventricular ejection fraction is 35-45% by visual estimate.   LV end diastolic pressure is severely elevated.   There is no aortic valve stenosis.   POST-OP DIAGNOSES Severe Multi-Vessel CAD:  Prox LAD 75%@ SP1 (just prior to extensive stented segment) Prox-mid LCx ~65% (prox stent edge lesion) with progression of jailed OM2 lesion to 90% (likely Culprit) Patent Prox to mid RCA stents with sequential  focal lesions 80%-60-70% lesions & 80%   Known to be moderate Ischemic CM - EG ~ 40% (unable to assess regional wall motion abnormalities due to poor filling. Recommend 2D echo to better assess)   Patient Profile  Shasta Capstick is a 77 y.o. male with CAD, systolic heart failure, hyperlipidemia (statin intolerant) who was admitted on 05/02/2022 with non-STEMI.   Assessment & Plan   # Non-STEMI #Three-vessel CAD -Admitted with non-STEMI.  Found to have three-vessel CAD.  Interventional cardiology recommended CABG evaluation. -Evaluated by CT surgery.  Plan for CABG later this week.  He was on Plavix.   They would like to wash this out. -Hold Plavix.  Continue aspirin heparin drip. -On a beta-blocker.  Bradycardia but no symptoms.  Okay to continue. -Statin intolerant.  Add Zetia.  Outpatient PCSK9 inhibitor evaluation. -Add Imdur.  Wean nitro drip.  Stable for transfer to floor.  # Acute systolic heart failure, EF 50% -LVEDP was elevated at the time of admission.  Given a one-time dose of Lasix.  Net -2 L.  Euvolemic on examination. -Continue carvedilol 3.125 mg twice daily.  Continue Entresto 24-26 mg twice daily.  Continue Jardiance 10 mg daily.  Add Aldactone pending BP. -Lasix as needed moving forward.  # Hyperlipidemia -Statin intolerant.  Add Zetia 10 mg daily.  Consideration for outpatient PCSK9 inhibitor therapy.  #FEN -No intravenous fluids - Diet: Heart healthy - DVT PPx: Heparin drip - Code: Full - Disposition: Transfer to floor today.  CABG later this week.  For questions or updates, please contact [place] Please consult [email or web address] for contact info under     Signed, [place] Bells T. Audie Box, MD, [place]  05/04/2022 10:06 AM

## 2022-05-04 NOTE — Progress Notes (Signed)
[facility]                                                                               [name], is a 77 y.o. male, DOB - 09-Apr-1945, [id number] Admit date - 05/02/2022    Outpatient Primary MD for the patient is Velna Hatchet, MD  LOS - 2  days    Brief summary    [name] is a 77 y.o. 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  Plan for CABG after plavix washout.no chest pain or sob today.  Echocardiogram ordered.    Hypertension Well controlled.  Continue with COREG 3.125 mg BID, Imdur 30 mg daily, Entresto 24-26 mg BID.   Hyperlipidemia -intolerant to statin; cardiology recommends discussion of Nexletol outpatient   Mild renal insufficiency:  Creatinine up to 1.3.    Mild normocytic anemia.  Hemoglobin stable around 12.   Constipation:  On miralax   Estimated body mass index is 29 kg/m as calculated from the following:   Height as of this encounter: '5\' 7"'$  (1.702 m).   Weight as of this encounter: 84 kg.  Code Status: Full code.  DVT Prophylaxis:  IV heparin.    Level of Care: Level of care: Telemetry Cardiac Family Communication:   Disposition Plan:     Remains inpatient appropriate:  CABG NEXT WEEK.   Procedures:  Cardiac catheterization  Consultants:   Cardiothoracic surgery  Cardiology.   Antimicrobials:    Anti-infectives (From admission, onward)    None        Medications  Scheduled Meds:  carvedilol  3.125 mg Oral BID WC   Chlorhexidine Gluconate Cloth  6 each Topical Daily   empagliflozin  10 mg Oral Daily   ezetimibe  10 mg Oral Daily   isosorbide mononitrate  30 mg Oral Daily   omega-3 acid ethyl esters  1 g Oral Daily   pantoprazole  40 mg Oral Daily   polyethylene glycol  17 g Oral Daily   sacubitril-valsartan  1 tablet Oral BID   sodium chloride flush  3 mL Intravenous Q12H   sodium chloride flush  3 mL Intravenous Q12H   Continuous Infusions:  sodium chloride     sodium chloride 10 mL/hr at 05/04/22 0801   sodium chloride     heparin 1,250 Units/hr (05/04/22 0801)   PRN Meds:.sodium chloride, sodium chloride, acetaminophen, nitroGLYCERIN, ondansetron (ZOFRAN) IV, sodium chloride flush, sodium chloride flush    Subjective:   [name] was seen and examined today.  No BM. No chest pain or sob.   Objective:   Vitals:  05/04/22 0755 05/04/22 0810 05/04/22 0910 05/04/22 1130  BP:  100/67 (!) 105/57   Pulse:  66 62   Resp:  15 15   Temp: 98 F (36.7 C)   98 F (36.7 C)  TempSrc: Oral   Oral  SpO2:  94% 94%   Weight:      Height:        Intake/Output Summary (Last 24 hours) at 05/04/2022 1403 Last data filed at 05/04/2022 0900 Gross per 24 hour  Intake 586.69 ml  Output 1500 ml  Net -913.31 ml    Filed Weights   05/03/22 0024 05/03/22 0600 05/04/22 0600  Weight: 81.9 kg 81.9 kg 84 kg     Exam General exam: Appears calm and comfortable  Respiratory system: Clear to auscultation. Respiratory effort normal. Cardiovascular system: S1 & S2 heard, RRR. No JVD,  Gastrointestinal system: Abdomen is nondistended, soft and nontender. Central nervous system: Alert and oriented. No focal neurological deficits. Extremities: Symmetric 5 x 5 power. Skin: No rashes, lesions or ulcers Psychiatry: mood is appropriate.    Data Reviewed:  I have  personally reviewed following labs and imaging studies   CBC Lab Results  Component Value Date   WBC 6.4 05/04/2022   RBC 3.95 (L) 05/04/2022   HGB 12.8 (L) 05/04/2022   HCT 36.6 (L) 05/04/2022   MCV 92.7 05/04/2022   MCH 32.4 05/04/2022   PLT 219 05/04/2022   MCHC 35.0 05/04/2022   RDW 15.3 05/04/2022   LYMPHSABS 1.6 05/02/2022   MONOABS 0.4 05/02/2022   EOSABS 0.2 05/02/2022   BASOSABS 0.0 [id number]     Last metabolic panel Lab Results  Component Value Date   NA 136 05/04/2022   K 3.6 05/04/2022   CL 102 05/04/2022   CO2 21 (L) 05/04/2022   BUN 18 05/04/2022   CREATININE 1.30 (H) 05/04/2022   GLUCOSE 124 (H) 05/04/2022   GFRNONAA 57 (L) 05/04/2022   GFRAA >60 08/11/2019   CALCIUM 9.1 05/04/2022   PROT 6.3 (L) 05/02/2022   ALBUMIN 3.6 05/02/2022   LABGLOB 3.2 06/16/2019   AGRATIO 1.5 06/16/2019   BILITOT 0.8 05/02/2022   ALKPHOS 40 05/02/2022   AST 39 05/02/2022   ALT 43 05/02/2022   ANIONGAP 13 05/04/2022    CBG (last 3)  Recent Labs    05/03/22 0023  GLUCAP 113*       Coagulation Profile: No results for input(s): "INR", "PROTIME" in the last 168 hours.   Radiology Studies: ECHOCARDIOGRAM COMPLETE  Result Date: 05/03/2022    ECHOCARDIOGRAM REPORT   Patient Name:   [name] Date of Exam: 05/03/2022 Medical Rec #:  [id number]      Height:       67.0 in Accession #:    [id number]     Weight:       180.6 lb Date of Birth:  Dec 22, 1945      BSA:          1.936 m Patient Age:    50 years       BP:           147/66 mmHg Patient Gender: M              HR:           72 bpm. Exam Location:  Inpatient Procedure: 2D Echo, Cardiac Doppler, Color Doppler and Intracardiac            Opacification Agent Indications:    CAD Native Vessel I25.10  History:        Patient has prior history of Echocardiogram examinations, most                 recent 01/20/2022. Cardiomyopathy, NSTEMI, Previous Myocardial                 Infarction and CAD; Risk Factors:Hypertension and  Former Smoker.  Sonographer:    Greer Pickerel Referring Phys: [id number] DAVID W HARDING  Sonographer Comments: Suboptimal parasternal window. Image acquisition challenging due to patient body habitus and Image acquisition challenging due to respiratory motion. IMPRESSIONS  1. 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 There is no visualized apical thrombus. . Left ventricular ejection fraction, by estimation, is 50%. The left ventricle has normal function. Left ventricular endocardial border not optimally defined to evaluate regional wall motion. Left ventricular diastolic parameters are consistent with Grade I diastolic dysfunction (impaired relaxation).  2. Right ventricular systolic function is normal. The right ventricular size is normal. Tricuspid regurgitation signal is inadequate for assessing PA pressure.  3. The mitral valve is normal in structure. Trivial mitral valve regurgitation. Mild to moderate mitral stenosis.  4. The aortic valve has an indeterminant number of cusps. Aortic valve regurgitation is moderate. No aortic stenosis is present.  5. Aortic dilatation noted. 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  6. The inferior vena cava is normal in size with <50% respiratory variability, suggesting right atrial pressure of 8 mmHg. 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 There is no visualized apical thrombus. Left ventricular ejection fraction, by estimation, is 50%. The left ventricle has normal function. Left ventricular endocardial border not optimally defined to evaluate regional wall motion. Definity contrast agent was given IV to delineate the left ventricular endocardial borders. The left ventricular internal cavity size was normal in size. There is no left ventricular hypertrophy. Left ventricular  diastolic parameters are consistent with Grade I diastolic dysfunction (impaired relaxation). Normal left ventricular filling pressure. Right  Ventricle: The right ventricular size is normal. Right vetricular wall thickness was not well visualized. Right ventricular systolic function is normal. Tricuspid regurgitation signal is inadequate for assessing PA pressure. Left Atrium: Left atrial size was normal in size. Right Atrium: Right atrial size was normal in size. Pericardium: There is no evidence of pericardial effusion. Mitral Valve: The mitral valve is normal in structure. There is mild thickening of the mitral valve leaflet(s). There is mild calcification of the mitral valve leaflet(s). Mild mitral annular calcification. Trivial mitral valve regurgitation. Mild to moderate mitral valve stenosis. MV peak gradient, 13.8 mmHg. The mean mitral valve gradient is 6.0 mmHg. Tricuspid Valve: The tricuspid valve is normal in structure. Tricuspid valve regurgitation is not demonstrated. No evidence of tricuspid stenosis. Aortic Valve: The aortic valve has an indeterminant number of cusps. Aortic valve regurgitation is moderate. Aortic regurgitation PHT measures 759 msec. No aortic stenosis is present. Aortic valve mean gradient measures 4.9 mmHg. Aortic valve peak gradient measures 11.3 mmHg. Aortic valve area, by VTI measures 2.19 cm. Pulmonic Valve: The pulmonic valve was not well visualized. Pulmonic valve regurgitation is not visualized. No evidence of pulmonic stenosis. Aorta: The aortic root is normal in size and structure and aortic dilatation noted. 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 Venous: The inferior vena cava is normal in size with less than 50% respiratory variability, suggesting right atrial pressure of 8 mmHg. IAS/Shunts: No atrial level shunt detected by color  flow Doppler.  LEFT VENTRICLE PLAX 2D LVIDd:         3.90 cm   Diastology LVIDs:         2.70 cm   LV e' medial:    4.90 cm/s LV PW:         1.00 cm   LV E/e' medial:  19.7 LV IVS:        0.90 cm   LV e' lateral:   7.29 cm/s LVOT diam:     1.90 cm   LV E/e'  lateral: 13.3 LV SV:         65 LV SV Index:   34 LVOT Area:     2.84 cm  RIGHT VENTRICLE RV S prime:     9.60 cm/s TAPSE (M-mode): 2.2 cm LEFT ATRIUM             Index        RIGHT ATRIUM           Index LA diam:        3.20 cm 1.65 cm/m   RA Area:     18.55 cm LA Vol (A2C):   72.3 ml 37.34 ml/m  RA Volume:   52.20 ml  26.96 ml/m LA Vol (A4C):   53.0 ml 27.37 ml/m LA Biplane Vol: 62.9 ml 32.49 ml/m  AORTIC VALVE                     PULMONIC VALVE AV Area (Vmax):    1.83 cm      PR End Diast Vel: 3.11 msec AV Area (Vmean):   2.07 cm AV Area (VTI):     2.19 cm AV Vmax:           168.44 cm/s AV Vmean:          100.239 cm/s AV VTI:            0.296 m AV Peak Grad:      11.3 mmHg AV Mean Grad:      4.9 mmHg LVOT Vmax:         109.00 cm/s LVOT Vmean:        73.200 cm/s LVOT VTI:          0.229 m LVOT/AV VTI ratio: 0.77 AI PHT:            759 msec  AORTA Ao Root diam: 3.80 cm Ao Asc diam:  3.70 cm MITRAL VALVE MV Area (PHT): 3.39 cm     SHUNTS MV Area VTI:   1.11 cm     Systemic VTI:  0.23 m MV Peak grad:  13.8 mmHg    Systemic Diam: 1.90 cm MV Mean grad:  6.0 mmHg MV Vmax:       1.86 m/s MV Vmean:      109.0 cm/s MV Decel Time: 224 msec MV E velocity: 96.60 cm/s MV A velocity: 150.00 cm/s MV E/A ratio:  0.64 Carlyle Dolly MD Electronically signed by Carlyle Dolly MD Signature Date/Time: 05/03/2022/2:30:26 PM    Final    CARDIAC CATHETERIZATION  Result Date: 05/03/2022   Prox LAD lesion is 75% stenosed - just prior to stent   Previously placed Mid LAD to Dist LAD stent of unknown type is  widely patent.   Prox Cx to Mid Cx lesion is 65% stenosed -just proximal to prior stent   Previously placed Prox RCA to Mid RCA stent of unknown type is  widely patent.   Previously placed Mid  Cx to Dist Cx stent is 5% stenosed with 90% stenosed side branch in 2nd Mrg.   Mid RCA lesion is 80% stenosed - just distal to stent.  Mid RCA to Dist RCA lesion is 65% stenosed.  Dist RCA lesion is 80% stenosed.    -----------------------------------------------   There is moderate left ventricular systolic dysfunction.  The left ventricular ejection fraction is 35-45% by visual estimate.   LV end diastolic pressure is severely elevated.   There is no aortic valve stenosis. 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 Recommend 2D echo to better assess) PLAN Unsure what the best option here is.  Clearly all the lesion segments are treatable with PCI with exception of potentially the OM branch which is jailed.  Complete revascularization with CABG is also an option.  At this point, the best plan is to temporize medically and discussed with interventional colleagues as well as CVTS consultation (will need to be called in AM by rounding MD). Treat with a short course of Aggrastat (6 hours) for potential thrombotic disease in OM2. RCA and LAD given his ongoing pain. Restart heparin 6 to 8 hours after sheath removal which would be after Aggrastat completion. Hold clopidogrel Check 2D echo to better assess EF Continue IV nitroglycerin infusion and will hold Entresto for now defer to rounding team based on blood pressure to determine whether will be restarted.  We will continue carvedilol. Glenetta Hew  DG Chest 2 View  Result Date: 05/02/2022 CLINICAL DATA:  Chest pain. EXAM: CHEST - 2 VIEW COMPARISON:  August 08, 2019. FINDINGS: The heart size and mediastinal contours are within normal limits. Both lungs are clear. The visualized skeletal structures are unremarkable. IMPRESSION: No active cardiopulmonary disease. Electronically Signed   By: Marijo Conception M.D.   On:  05/02/2022 15:39       Hosie Poisson M.D. [facility] 05/04/2022, 2:03 PM  Available via Epic secure chat 7am-7pm After 7 pm, please refer to night coverage provider listed on amion.

## 2022-05-05 ENCOUNTER — Other Ambulatory Visit: Payer: Self-pay

## 2022-05-05 ENCOUNTER — Encounter (HOSPITAL_COMMUNITY): Payer: Self-pay | Admitting: Cardiology

## 2022-05-05 ENCOUNTER — Other Ambulatory Visit (HOSPITAL_COMMUNITY): Payer: 59

## 2022-05-05 ENCOUNTER — Encounter (HOSPITAL_COMMUNITY)
Admission: EM | Disposition: A | Discharge status: Home / Self Care | Payer: Self-pay | Source: Home / Self Care | Attending: Thoracic Surgery (Cardiothoracic Vascular Surgery)

## 2022-05-05 ENCOUNTER — Inpatient Hospital Stay (HOSPITAL_COMMUNITY): Payer: 59

## 2022-05-05 DIAGNOSIS — I10 Essential (primary) hypertension: Secondary | ICD-10-CM | POA: Diagnosis not present

## 2022-05-05 DIAGNOSIS — I2510 Atherosclerotic heart disease of native coronary artery without angina pectoris: Secondary | ICD-10-CM | POA: Diagnosis not present

## 2022-05-05 DIAGNOSIS — I214 Non-ST elevation (NSTEMI) myocardial infarction: Secondary | ICD-10-CM | POA: Diagnosis not present

## 2022-05-05 DIAGNOSIS — I255 Ischemic cardiomyopathy: Secondary | ICD-10-CM | POA: Diagnosis not present

## 2022-05-05 DIAGNOSIS — I252 Old myocardial infarction: Secondary | ICD-10-CM | POA: Diagnosis not present

## 2022-05-05 LAB — CBC
HCT: 38.8 % — ABNORMAL LOW (ref 39.0–52.0)
Hemoglobin: 13.3 g/dL (ref 13.0–17.0)
MCH: 31.4 pg (ref 26.0–34.0)
MCHC: 34.3 g/dL (ref 30.0–36.0)
MCV: 91.7 fL (ref 80.0–100.0)
Platelets: 209 10*3/uL (ref 150–400)
RBC: 4.23 MIL/uL (ref 4.22–5.81)
RDW: 15.2 % (ref 11.5–15.5)
WBC: 5.8 10*3/uL (ref 4.0–10.5)
nRBC: 0 % (ref 0.0–0.2)

## 2022-05-05 LAB — BASIC METABOLIC PANEL
Anion gap: 9 (ref 5–15)
BUN: 18 mg/dL (ref 8–23)
CO2: 24 mmol/L (ref 22–32)
Calcium: 9.4 mg/dL (ref 8.9–10.3)
Chloride: 103 mmol/L (ref 98–111)
Creatinine, Ser: 1.47 mg/dL — ABNORMAL HIGH (ref 0.61–1.24)
GFR, Estimated: 49 mL/min — ABNORMAL LOW (ref >60)
Glucose, Bld: 120 mg/dL — ABNORMAL HIGH (ref 70–99)
Potassium: 3.8 mmol/L (ref 3.5–5.1)
Sodium: 136 mmol/L (ref 135–145)

## 2022-05-05 LAB — PULMONARY FUNCTION TEST
FEF 25-75 Pre: 2.35 L/sec
FEF2575-%Pred-Pre: 123 %
FEV1-%Pred-Pre: 87 %
FEV1-Pre: 2.33 L
FEV1FVC-%Pred-Pre: 112 %
FEV6-%Pred-Pre: 82 %
FEV6-Pre: 2.84 L
FEV6FVC-%Pred-Pre: 106 %
FVC-%Pred-Pre: 77 %
FVC-Pre: 2.85 L
Pre FEV1/FVC ratio: 82 %
Pre FEV6/FVC Ratio: 100 %

## 2022-05-05 LAB — HEPARIN LEVEL (UNFRACTIONATED): Heparin Unfractionated: 0.46 IU/mL (ref 0.30–0.70)

## 2022-05-05 LAB — SURGICAL PCR SCREEN
MRSA, PCR: NEGATIVE
Staphylococcus aureus: NEGATIVE

## 2022-05-05 SURGERY — LEFT HEART CATH AND CORONARY ANGIOGRAPHY
Anesthesia: LOCAL

## 2022-05-05 MED ORDER — BISACODYL 10 MG RE SUPP: 10.0000 mg | Freq: Every day | RECTAL | Status: DC | PRN

## 2022-05-05 MED ORDER — SENNOSIDES-DOCUSATE SODIUM 8.6-50 MG PO TABS
2.0000 | ORAL_TABLET | Freq: Two times a day (BID) | ORAL | Status: DC
  Administered 2022-05-05 – 2022-05-07 (×4): 2 via ORAL
  Filled 2022-05-05 (×5): qty 2

## 2022-05-05 MED ORDER — MUPIROCIN 2 % EX OINT: 1.0000 | TOPICAL_OINTMENT | Freq: Two times a day (BID) | CUTANEOUS | Status: DC

## 2022-05-05 MED ORDER — POTASSIUM CHLORIDE CRYS ER 20 MEQ PO TBCR
20.0000 meq | EXTENDED_RELEASE_TABLET | Freq: Once | ORAL | Status: AC
  Administered 2022-05-05: 20 meq via ORAL
  Filled 2022-05-05: qty 1

## 2022-05-05 NOTE — Progress Notes (Addendum)
CARDIOLOGY ROUNDING PROGRESS NOTE  Patient Name: [name] Date of Encounter: 05/05/2022  [facility] Cardiologist: Jenkins Rouge, MD   Subjective    HPI: [name] is a 77 y.o. male with a hx of severe multivessel CAD and multiple interventions, ischemic cardiomyopathy, GI bleed with duodenal erosion, HTN, HLD, presenting to [facility] with chest pain and found to have an NSTEMI now s/p cardiac cath with multivessel disease  Principal Problem:   NSTEMI (non-ST elevated myocardial infarction) [facility]) Active Problems:   Ischemic cardiomyopathy   CAD S/P percutaneous coronary angioplasty   Previous myocardial infarction older than 8 weeks   Hyperlipidemia with target low density lipoprotein (LDL) cholesterol less than 70 mg/dL   Hypertension   O/N: Chest pain free. Net negative, but with decreased diuresis off of furosemide. No signs of volume overload on exam. Patient concerned about no having a bowel movement. Denies CP, shortness of breath, changes in vision, HA, nausea or vomiting. Denies bleeding per mouth or urine.  ROS:  All other ROS reviewed and negative. Pertinent positives noted in the HPI.     Inpatient Medications  Scheduled Meds:  carvedilol  3.125 mg Oral BID WC   Chlorhexidine Gluconate Cloth  6 each Topical Daily   empagliflozin  10 mg Oral Daily   ezetimibe  10 mg Oral Daily   isosorbide mononitrate  30 mg Oral Daily   mupirocin ointment  1 Application Nasal BID   omega-3 acid ethyl esters  1 g Oral Daily   pantoprazole  40 mg Oral Daily   polyethylene glycol  17 g Oral Daily   sacubitril-valsartan  1 tablet Oral BID   sodium chloride flush  3 mL Intravenous Q12H   sodium chloride flush  3 mL Intravenous Q12H   Continuous Infusions:  sodium chloride     sodium chloride 10 mL/hr at 05/05/22 0800   sodium chloride     heparin 1,250 Units/hr (05/05/22 0800)   PRN Meds: sodium chloride, sodium chloride, acetaminophen, nitroGLYCERIN, ondansetron  (ZOFRAN) IV, sodium chloride flush, sodium chloride flush   Vital Signs    Vitals:   05/05/22 0700 05/05/22 0715 05/05/22 0755 05/05/22 0800  BP:  114/60  (!) 101/42  Pulse: 69 64  66  Resp: '20 17  11  '$ Temp:   97.8 F (36.6 C)   TempSrc:   Oral   SpO2: 92% 93%  96%  Weight:      Height:        Intake/Output Summary (Last 24 hours) at 05/05/2022 0844 Last data filed at 05/05/2022 0835 Gross per 24 hour  Intake 1259.34 ml  Output 2025 ml  Net -765.66 ml      05/05/2022    5:00 AM 05/04/2022    6:00 AM 05/03/2022    6:00 AM  Last 3 Weights  Weight (lbs) 185 lb 13.6 oz 185 lb 3 oz 180 lb 8.9 oz  Weight (kg) 84.3 kg 84 kg 81.9 kg      Telemetry  Overnight telemetry shows NSR, which I personally reviewed.    Physical Exam   General: Well nourished, well developed, in no acute distress Head: Atraumatic, normal size  Eyes: PEERLA, EOMI  Neck: Supple, no JVD Endocrine: No thryomegaly Cardiac: Normal S1, S2; RRR; no murmurs, rubs, or gallops Lungs: Clear to auscultation bilaterally, no wheezing, rhonchi or rales  Abd: Soft, nontender, no hepatomegaly  Ext: No edema, pulses 2+ Musculoskeletal: No deformities, BUE and BLE strength normal and equal; radial cath site  clean dry intact. Skin: Warm and dry, no rashes   Neuro: Alert and oriented to person, place, time, and situation, CNII-XII grossly intact, no focal deficits  Psych: Normal mood and affect   Labs    High Sensitivity Troponin:   Recent Labs  Lab 05/02/22 1514 05/02/22 1714 05/02/22 2117 05/03/22 0147  TROPONINIHS 214* 5,099* 8,341* 8,349*     Chemistry Recent Labs  Lab 05/02/22 1514 05/04/22 0014 05/05/22 0325  NA 138 136 136  K 4.3 3.6 3.8  CL 105 102 103  CO2 19* 21* 24  GLUCOSE 129* 124* 120*  BUN '17 18 18  '$ CREATININE 1.20 1.30* 1.47*  CALCIUM 9.1 9.1 9.4  PROT 6.3*  --   --   ALBUMIN 3.6  --   --   AST 39  --   --   ALT 43  --   --   ALKPHOS 40  --   --   BILITOT 0.8  --   --   GFRNONAA  >60 57* 49*  ANIONGAP '14 13 9    '$ Lipids No results for input(s): "CHOL", "TRIG", "HDL", "LABVLDL", "LDLCALC", "CHOLHDL" in the last 168 hours.  Hematology Recent Labs  Lab 05/03/22 0147 05/04/22 0014 05/05/22 0325  WBC 6.0 6.4 5.8  RBC 4.10* 3.95* 4.23  HGB 12.9* 12.8* 13.3  HCT 37.5* 36.6* 38.8*  MCV 91.5 92.7 91.7  MCH 31.5 32.4 31.4  MCHC 34.4 35.0 34.3  RDW 15.0 15.3 15.2  PLT 223 219 209   Thyroid No results for input(s): "TSH", "FREET4" in the last 168 hours.  BNPNo results for input(s): "BNP", "PROBNP" in the last 168 hours.  DDimer No results for input(s): "DDIMER" in the last 168 hours.   Radiology    No new studies  Cardiac Studies - Summarized  TTE 05/03/2022  1. 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 There is no visualized  apical thrombus.  Left ventricular ejection fraction, by estimation, is  50%. The left ventricle has normal function. Left ventricular endocardial  border not optimally defined to evaluate regional wall motion. Left ventricular diastolic parameters are  consistent with Grade I diastolic dysfunction (impaired relaxation).   2. Right ventricular systolic function is normal. The right ventricular  size is normal. Tricuspid regurgitation signal is inadequate for assessing  PA pressure.   3. The mitral valve is normal in structure. Trivial mitral valve  regurgitation. Mild to moderate mitral stenosis.   4. The aortic valve has an indeterminant number of cusps. Aortic valve  regurgitation is moderate. No aortic stenosis is present.   5. Aortic dilatation noted. There is mild dilatation of the ascending  aorta, measuring 37 mm.   6. The inferior vena cava is normal in size with <50% respiratory  variability, suggesting right atrial pressure of 8 mmHg.    LHC 05/02/2022 Severe Multi-Vessel CAD:  Prox LAD 75%@ SP1 (just prior to extensive stented segment) Prox-mid LCx ~65% (prox stent edge lesion) with progression of jailed OM2 lesion to 90%  (likely Culprit) Patent Prox to mid RCA stents with sequential focal lesions 80%-60-70% lesions & 80%   Known to be moderate Ischemic CM - EF~ 40% (unable to assess regional wall motion abnormalities due to poor filling. Recommend 2D echo to better assess)      2= hypokinesis    Patient Profile  [name] is a 77 y.o. male with CAD, ischemic cardiomyopathy, anterior MI with DES to LAD and circumflex in 2012, recurrent MI in 2012 s/p  PCA stent, DE to mid LAD for restenosis in 2019, GI bleed with duodenal erosion, HTN, HLD, presenting to [facility] with chest pain and found to have an NSTEMI now s/p cardiac cath with multivessel disease  Assessment & Plan  # Non-STEMI #Three-vessel CAD Admitted with non-STEMI.  Taken to Cath Lab for ongoing chest pain, found to have three-vessel CAD.  Recommended CABG evaluation. -Evaluated by CT surgery.  Plan for CABG later this week -Currently on Plavix wash out since 3/8 -> post cath treated with 6 hours of Aggrastat -Continue aspirin and heparin drip -Statin intolerant.  Now on Zetia.  Consider outpatient PCSK9 inhibitor evaluation. -off of nitro gtt, now on IMDUR -Stable for transfer to floor.  # Acute HFpEF -  EF 50% -LVEDP was elevated at the time of admission.  -Euvolemic on examination. -Continue carvedilol 3.125 mg twice daily, Entresto 24-26 mg twice daily, -Lasix PRN -Consider Jardiance and Aldactone as renal function and BP allows   #AKI -Likely in setting of LHC c/b furosemide therapy -CTM  # Hyperlipidemia -Statin intolerant.  Continue on  Zetia 10 mg daily.   -Consider outpatient PCSK9 inhibitor therapy.   #FEN -No intravenous fluids - Diet: Heart healthy - DVT PPx: Heparin drip - Code: Full - Disposition: Transfer orders to the floor today.  CABG later; Thursday and Friday.   For questions or updates, please contact [place] Please consult [email or web address] for contact info under     Romana Juniper,  MD [facility] 05/05/2022, 8:44 AM   ATTENDING ATTESTATION  I have seen, examined and evaluated the patient this morning on rounds along with Resident Physician- Romana Juniper, MD.  After reviewing all the available data and chart, we discussed the patients laboratory, study & physical findings as well as symptoms in detail.  I agree with her findings, examination as well as impression recommendations as per our discussion.    Attending adjustments noted in italics.    Overall stable.  Multivessel disease with planned CABG later this week.  He is euvolemic on exam, back to oral diuretic.  Continue current medications.  Plan to transfer to telemetry today.  Waiting for Plavix washout-CABG later this week.  Anticipate the CABG evaluation study being done in the next couple days. Would recommend going back on Plavix post CABG. On stable dose of beta-blocker, Entresto, spironolactone and Jardiance.    Leonie Man, MD, MS Glenetta Hew, M.D., M.S. Interventional Cardiologist  [place]  Pager # [phone] Phone # [phone] [address] [place], [place] [address]

## 2022-05-05 NOTE — Progress Notes (Signed)
[facility]                                                                               [name], is a 77 y.o. male, DOB - [birth year], [id number] Admit date - 05/02/2022    Outpatient Primary MD for the patient is Velna Hatchet, MD  LOS - 3  days    Brief summary    [name] is a 77 y.o. 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  Plan for CABG after plavix washout.no chest pain or sob today.  Echocardiogram ordered and reviewed with the patient. No thrombus. LVEF of 50%. With grade 1 diastolic dysfunction.    Hypertension Well controlled.  Continue with COREG 3.125 mg BID, Imdur 30 mg daily, Entresto 24-26 mg BID.   Hyperlipidemia -intolerant to statin; cardiology recommends discussion of Nexletol outpatient   Mild AKI;  creatinine up from 1.2 to 1.47.    Mild normocytic anemia.  Hemoglobin stable around 12.   Constipation:  On miralax Dulcolax suppository added in addition to senekot.    Estimated body mass index is 29.11 kg/m as calculated from the following:   Height as of this encounter: '5\' 7"'$  (1.702 m).   Weight as of this encounter: 84.3 kg.  Code Status: Full code.  DVT Prophylaxis:  IV heparin.    Level of Care: Level of care: Telemetry Cardiac Family Communication:   Disposition Plan:     Remains inpatient appropriate:  CABG  NEXT WEEK.   Procedures:  Cardiac catheterization  Consultants:   Cardiothoracic surgery  Cardiology.   Antimicrobials:   Anti-infectives (From admission, onward)    None        Medications  Scheduled Meds:  carvedilol  3.125 mg Oral BID WC   Chlorhexidine Gluconate Cloth  6 each Topical Daily   empagliflozin  10 mg Oral Daily   ezetimibe  10 mg Oral Daily   isosorbide mononitrate  30 mg Oral Daily   mupirocin ointment  1 Application Nasal BID   omega-3 acid ethyl esters  1 g Oral Daily   pantoprazole  40 mg Oral Daily   polyethylene glycol  17 g Oral Daily   sacubitril-valsartan  1 tablet Oral BID   sodium chloride flush  3 mL Intravenous Q12H   sodium chloride flush  3 mL Intravenous Q12H   Continuous Infusions:  sodium chloride     sodium chloride 10 mL/hr at 05/05/22 0800   sodium chloride     heparin 1,250 Units/hr (05/05/22 0800)   PRN Meds:.sodium chloride, sodium chloride, acetaminophen, nitroGLYCERIN, ondansetron (ZOFRAN) IV, sodium  chloride flush, sodium chloride flush    Subjective:   [name] was seen and examined today.  NO BM yet, dulcolax suppository ordered.   Objective:   Vitals:   05/05/22 0700 05/05/22 0715 05/05/22 0755 05/05/22 0800  BP:  114/60  (!) 101/42  Pulse: 69 64  66  Resp: '20 17  11  '$ Temp:   97.8 F (36.6 C)   TempSrc:   Oral   SpO2: 92% 93%  96%  Weight:      Height:        Intake/Output Summary (Last 24 hours) at 05/05/2022 0852 Last data filed at 05/05/2022 0835 Gross per 24 hour  Intake 1259.34 ml  Output 2025 ml  Net -765.66 ml    Filed Weights   05/03/22 0600 05/04/22 0600 05/05/22 0500  Weight: 81.9 kg 84 kg 84.3 kg     Exam General exam: Appears calm and comfortable  Respiratory system: Clear to auscultation. Respiratory effort normal. Cardiovascular system: S1 & S2 heard, RRR. No JVD, No pedal edema. Gastrointestinal system: Abdomen is nondistended, soft and nontender.  Central nervous  system: Alert and oriented. No focal neurological deficits. Extremities: Symmetric 5 x 5 power. Skin: No rashes, Psychiatry: Mood & affect appropriate.   Data Reviewed:  I have personally reviewed following labs and imaging studies   CBC Lab Results  Component Value Date   WBC 5.8 05/05/2022   RBC 4.23 05/05/2022   HGB 13.3 05/05/2022   HCT 38.8 (L) 05/05/2022   MCV 91.7 05/05/2022   MCH 31.4 05/05/2022   PLT 209 05/05/2022   MCHC 34.3 05/05/2022   RDW 15.2 05/05/2022   LYMPHSABS 1.6 05/02/2022   MONOABS 0.4 05/02/2022   EOSABS 0.2 05/02/2022   BASOSABS 0.0 Q000111Q     Last metabolic panel Lab Results  Component Value Date   NA 136 05/05/2022   K 3.8 05/05/2022   CL 103 05/05/2022   CO2 24 05/05/2022   BUN 18 05/05/2022   CREATININE 1.47 (H) 05/05/2022   GLUCOSE 120 (H) 05/05/2022   GFRNONAA 49 (L) 05/05/2022   GFRAA >60 08/11/2019   CALCIUM 9.4 05/05/2022   PROT 6.3 (L) 05/02/2022   ALBUMIN 3.6 05/02/2022   LABGLOB 3.2 06/16/2019   AGRATIO 1.5 06/16/2019   BILITOT 0.8 05/02/2022   ALKPHOS 40 05/02/2022   AST 39 05/02/2022   ALT 43 05/02/2022   ANIONGAP 9 05/05/2022    CBG (last 3)  Recent Labs    05/03/22 0023  GLUCAP 113*       Coagulation Profile: No results for input(s): "INR", "PROTIME" in the last 168 hours.   Radiology Studies: ECHOCARDIOGRAM COMPLETE  Result Date: 05/03/2022    ECHOCARDIOGRAM REPORT   Patient Name:   [name] Date of Exam: 05/03/2022 Medical Rec #:  [id number]      Height:       67.0 in Accession #:    [id number]     Weight:       180.6 lb Date of Birth:  19-Feb-1946      BSA:          1.936 m Patient Age:    26 years       BP:           147/66 mmHg Patient Gender: M              HR:           72 bpm. Exam Location:  Inpatient Procedure: 2D Echo, Cardiac  Doppler, Color Doppler and Intracardiac            Opacification Agent Indications:    CAD Native Vessel I25.10  History:        Patient has prior history of  Echocardiogram examinations, most                 recent 01/20/2022. Cardiomyopathy, NSTEMI, Previous Myocardial                 Infarction and CAD; Risk Factors:Hypertension and Former Smoker.  Sonographer:    Greer Pickerel Referring Phys: 30 DAVID W HARDING  Sonographer Comments: Suboptimal parasternal window. Image acquisition challenging due to patient body habitus and Image acquisition challenging due to respiratory motion. IMPRESSIONS  1. 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 There is no visualized apical thrombus. . Left ventricular ejection fraction, by estimation, is 50%. The left ventricle has normal function. Left ventricular endocardial border not optimally defined to evaluate regional wall motion. Left ventricular diastolic parameters are consistent with Grade I diastolic dysfunction (impaired relaxation).  2. Right ventricular systolic function is normal. The right ventricular size is normal. Tricuspid regurgitation signal is inadequate for assessing PA pressure.  3. The mitral valve is normal in structure. Trivial mitral valve regurgitation. Mild to moderate mitral stenosis.  4. The aortic valve has an indeterminant number of cusps. Aortic valve regurgitation is moderate. No aortic stenosis is present.  5. Aortic dilatation noted. 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  6. The inferior vena cava is normal in size with <50% respiratory variability, suggesting right atrial pressure of 8 mmHg. 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 There is no visualized apical thrombus. Left ventricular ejection fraction, by estimation, is 50%. The left ventricle has normal function. Left ventricular endocardial border not optimally defined to evaluate regional wall motion. Definity contrast agent was given IV to delineate the left ventricular endocardial borders. The left ventricular internal cavity size was normal in size. There is no left ventricular  hypertrophy. Left ventricular  diastolic parameters are consistent with Grade I diastolic dysfunction (impaired relaxation). Normal left ventricular filling pressure. Right Ventricle: The right ventricular size is normal. Right vetricular wall thickness was not well visualized. Right ventricular systolic function is normal. Tricuspid regurgitation signal is inadequate for assessing PA pressure. Left Atrium: Left atrial size was normal in size. Right Atrium: Right atrial size was normal in size. Pericardium: There is no evidence of pericardial effusion. Mitral Valve: The mitral valve is normal in structure. There is mild thickening of the mitral valve leaflet(s). There is mild calcification of the mitral valve leaflet(s). Mild mitral annular calcification. Trivial mitral valve regurgitation. Mild to moderate mitral valve stenosis. MV peak gradient, 13.8 mmHg. The mean mitral valve gradient is 6.0 mmHg. Tricuspid Valve: The tricuspid valve is normal in structure. Tricuspid valve regurgitation is not demonstrated. No evidence of tricuspid stenosis. Aortic Valve: The aortic valve has an indeterminant number of cusps. Aortic valve regurgitation is moderate. Aortic regurgitation PHT measures 759 msec. No aortic stenosis is present. Aortic valve mean gradient measures 4.9 mmHg. Aortic valve peak gradient measures 11.3 mmHg. Aortic valve area, by VTI measures 2.19 cm. Pulmonic Valve: The pulmonic valve was not well visualized. Pulmonic valve regurgitation is not visualized. No evidence of pulmonic stenosis. Aorta: The aortic root is normal in size and structure and aortic dilatation noted. 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 Venous: The inferior  vena cava is normal in size with less than 50% respiratory variability, suggesting right atrial pressure of 8 mmHg. IAS/Shunts: No atrial level shunt detected by color flow Doppler.  LEFT VENTRICLE PLAX 2D LVIDd:         3.90 cm   Diastology LVIDs:          2.70 cm   LV e' medial:    4.90 cm/s LV PW:         1.00 cm   LV E/e' medial:  19.7 LV IVS:        0.90 cm   LV e' lateral:   7.29 cm/s LVOT diam:     1.90 cm   LV E/e' lateral: 13.3 LV SV:         65 LV SV Index:   34 LVOT Area:     2.84 cm  RIGHT VENTRICLE RV S prime:     9.60 cm/s TAPSE (M-mode): 2.2 cm LEFT ATRIUM             Index        RIGHT ATRIUM           Index LA diam:        3.20 cm 1.65 cm/m   RA Area:     18.55 cm LA Vol (A2C):   72.3 ml 37.34 ml/m  RA Volume:   52.20 ml  26.96 ml/m LA Vol (A4C):   53.0 ml 27.37 ml/m LA Biplane Vol: 62.9 ml 32.49 ml/m  AORTIC VALVE                     PULMONIC VALVE AV Area (Vmax):    1.83 cm      PR End Diast Vel: 3.11 msec AV Area (Vmean):   2.07 cm AV Area (VTI):     2.19 cm AV Vmax:           168.44 cm/s AV Vmean:          100.239 cm/s AV VTI:            0.296 m AV Peak Grad:      11.3 mmHg AV Mean Grad:      4.9 mmHg LVOT Vmax:         109.00 cm/s LVOT Vmean:        73.200 cm/s LVOT VTI:          0.229 m LVOT/AV VTI ratio: 0.77 AI PHT:            759 msec  AORTA Ao Root diam: 3.80 cm Ao Asc diam:  3.70 cm MITRAL VALVE MV Area (PHT): 3.39 cm     SHUNTS MV Area VTI:   1.11 cm     Systemic VTI:  0.23 m MV Peak grad:  13.8 mmHg    Systemic Diam: 1.90 cm MV Mean grad:  6.0 mmHg MV Vmax:       1.86 m/s MV Vmean:      109.0 cm/s MV Decel Time: 224 msec MV E velocity: 96.60 cm/s MV A velocity: 150.00 cm/s MV E/A ratio:  0.64 Carlyle Dolly MD Electronically signed by Carlyle Dolly MD Signature Date/Time: 05/03/2022/2:30:26 PM    Final        Hosie Poisson M.D. [facility] 05/05/2022, 8:52 AM  Available via Epic secure chat 7am-7pm After 7 pm, please refer to night coverage provider listed on amion.

## 2022-05-05 NOTE — Progress Notes (Signed)
[place] for Heparin (following 6 hours of Tirofiban) Indication: multi-vessel CAD s/p cath, awaiting CABG  Allergies  Allergen Reactions   Asa [Aspirin] Other (See Comments)    Abdominal bleeding   Penicillins Anaphylaxis    Has patient had a PCN reaction causing immediate rash, facial/tongue/throat swelling, SOB or lightheadedness with hypotension: Yes Has patient had a PCN reaction causing severe rash involving mucus membranes or skin necrosis: No Has patient had a PCN reaction that required hospitalization: No Has patient had a PCN reaction occurring within the last 10 years: No If all of the above answers are "NO", then may proceed with Cephalosporin use.   Statins Other (See Comments)    Stiff joints, muscle tightness, couldn't walk    Patient Measurements: Height: '5\' 7"'$  (170.2 cm) Weight: 84.3 kg (185 lb 13.6 oz) IBW/kg (Calculated) : 66.1 Heparin DQ: 82 kg  Vital Signs: Temp: 98 F (36.7 C) (03/11 1200) Temp Source: Oral (03/11 1200) BP: 115/73 (03/11 1200) Pulse Rate: 74 (03/11 1200)  Labs: Recent Labs    05/02/22 1514 05/02/22 1714 05/02/22 2117 05/03/22 0147 05/03/22 1428 05/04/22 0014 05/04/22 0949 05/05/22 0325  HGB 12.9*  --   --  12.9*  --  12.8*  --  13.3  HCT 39.2  --   --  37.5*  --  36.6*  --  38.8*  PLT 243  --   --  223  --  219  --  209  HEPARINUNFRC  --   --   --   --    < > 0.35 0.35 0.46  CREATININE 1.20  --   --   --   --  1.30*  --  1.47*  TROPONINIHS 214* 5,099* 8,341* 8,349*  --   --   --   --    < > = values in this interval not displayed.     Estimated Creatinine Clearance: 44.4 mL/min (A) (by C-G formula based on SCr of 1.47 mg/dL (H)).   Medical History: Past Medical History:  Diagnosis Date   Anxiety    Arthritis    "wrists" (07/27/2014)   Coronary artery disease    a.  Pt with 4 stents over the course of 2003-2012;  b.  LHC 6/16:  LAD, RCA, LCx stents patent, OM1 50%, EF 50-55% >> med  rx    Elevated LFTs    GERD (gastroesophageal reflux disease)    Hypercholesterolemia    Hypertension    Ischemic cardiomyopathy    cMRI 03/2011:  EF 48% with dist ant, septal, apical and inf-apical dyskinesia, no apical clot, dist ant, apical and septal full thickness scar >> No ICD needed  //  Limited echo 7/19: Large apical septal aneurysm, severe hypokinesis of the entire apex and mid to apical segments of the anterior wall, anterolateral wall and anterior septum-consistent with infarct and most of the LAD territory; EF 35    Left ventricular aneurysm    Limited echo 7/19: Large apical septal aneurysm, severe hypokinesis of the entire apex and mid to apical segments of the anterior wall, anterolateral wall and anterior septum-consistent with infarct and most of the LAD territory; EF 35   Myocardial infarction [facility]) 2003    Assessment: [name] is a 77 y/o M with chest pain/NSTEMI who went for urgent cath last night. He was found to have severe multi-vessel CAD. Now s/p tirofiban x 6 hours post-cath then re-starting heparin drip. Baseline CBC ok. Renal function ok. Planning  CABG after 5 day Plavix washout.  Heparin level this AM therapeutic on recheck at 0.46. CBC is stable.  Goal of Therapy:  Heparin level 0.3-0.7 units/ml Monitor platelets by anticoagulation protocol: Yes   Plan:  Cont heparin 1250 units/hr Daily heparin level and CBC  Thank you for involving pharmacy in this patient's care.  Vicenta Dunning, PharmD  PGY1 Pharmacy Resident

## 2022-05-06 ENCOUNTER — Inpatient Hospital Stay (HOSPITAL_COMMUNITY): Payer: 59

## 2022-05-06 ENCOUNTER — Other Ambulatory Visit (HOSPITAL_COMMUNITY): Payer: Self-pay

## 2022-05-06 DIAGNOSIS — Z01810 Encounter for preprocedural cardiovascular examination: Secondary | ICD-10-CM | POA: Diagnosis not present

## 2022-05-06 DIAGNOSIS — I2510 Atherosclerotic heart disease of native coronary artery without angina pectoris: Secondary | ICD-10-CM | POA: Diagnosis not present

## 2022-05-06 DIAGNOSIS — E785 Hyperlipidemia, unspecified: Secondary | ICD-10-CM | POA: Diagnosis not present

## 2022-05-06 DIAGNOSIS — I10 Essential (primary) hypertension: Secondary | ICD-10-CM | POA: Diagnosis not present

## 2022-05-06 DIAGNOSIS — I214 Non-ST elevation (NSTEMI) myocardial infarction: Secondary | ICD-10-CM | POA: Diagnosis not present

## 2022-05-06 DIAGNOSIS — I255 Ischemic cardiomyopathy: Secondary | ICD-10-CM | POA: Diagnosis not present

## 2022-05-06 LAB — CBC
HCT: 38.4 % — ABNORMAL LOW (ref 39.0–52.0)
Hemoglobin: 13.3 g/dL (ref 13.0–17.0)
MCH: 32 pg (ref 26.0–34.0)
MCHC: 34.6 g/dL (ref 30.0–36.0)
MCV: 92.3 fL (ref 80.0–100.0)
Platelets: 172 10*3/uL (ref 150–400)
RBC: 4.16 MIL/uL — ABNORMAL LOW (ref 4.22–5.81)
RDW: 14.9 % (ref 11.5–15.5)
WBC: 5.7 10*3/uL (ref 4.0–10.5)
nRBC: 0 % (ref 0.0–0.2)

## 2022-05-06 LAB — BASIC METABOLIC PANEL
Anion gap: 22 — ABNORMAL HIGH (ref 5–15)
BUN: 19 mg/dL (ref 8–23)
CO2: 19 mmol/L — ABNORMAL LOW (ref 22–32)
Calcium: 8.3 mg/dL — ABNORMAL LOW (ref 8.9–10.3)
Chloride: 99 mmol/L (ref 98–111)
Creatinine, Ser: 1.21 mg/dL (ref 0.61–1.24)
GFR, Estimated: >60 mL/min (ref >60)
Glucose, Bld: 99 mg/dL (ref 70–99)
Potassium: 3.9 mmol/L (ref 3.5–5.1)
Sodium: 140 mmol/L (ref 135–145)

## 2022-05-06 LAB — HEPARIN LEVEL (UNFRACTIONATED)
Heparin Unfractionated: >1.1 IU/mL — ABNORMAL HIGH (ref 0.30–0.70)
Heparin Unfractionated: 0.4 IU/mL (ref 0.30–0.70)

## 2022-05-06 MED ORDER — POTASSIUM CHLORIDE CRYS ER 20 MEQ PO TBCR
20.0000 meq | EXTENDED_RELEASE_TABLET | Freq: Once | ORAL | Status: AC
  Administered 2022-05-06: 20 meq via ORAL
  Filled 2022-05-06: qty 1

## 2022-05-06 MED ORDER — HYDRALAZINE HCL 25 MG PO TABS: 25.0000 mg | ORAL_TABLET | Freq: Three times a day (TID) | ORAL | Status: DC | PRN

## 2022-05-06 MED FILL — Nitroglycerin IV Soln 100 MCG/ML in D5W: INTRA_ARTERIAL | Qty: 10 | Status: AC

## 2022-05-06 NOTE — Progress Notes (Signed)
[facility]                                                                               Safal Kaster, is a 77 y.o. male, DOB - 08/03/1945, [id number] Admit date - 05/02/2022    Outpatient Primary MD for the patient is Velna Hatchet, MD  LOS - 4  days    Brief summary    [name] is a 77 y.o. 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  he underwent cardiac cath showing multi vessel disease. Cardiothoracic surgery consulted and he is scheduled for CABG on Friday.   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  Plan for CABG after plavix washout. No new complaints today.  Echocardiogram ordered and reviewed with the patient. No thrombus. LVEF of 50%. With grade 1 diastolic dysfunction.    Hypertension Well controlled.  Continue with COREG 3.125 mg BID, Imdur 30 mg daily, Entresto 24-26 mg BID.   Hyperlipidemia -intolerant to statin; cardiology recommends discussion of Nexletol outpatient   Mild AKI;  Resolved, creatinine is back to 1.2 . Which appears to be his baseline.    Mild normocytic anemia.  Hemoglobin stable around 12.   Constipation:  Resolved.  On miralax Dulcolax suppository added in addition to senekot.    Estimated body mass index is 29.11 kg/m as calculated from the following:   Height as of this encounter: '5\' 7"'$  (1.702 m).   Weight as of this encounter: 84.3 kg.  Code Status: Full  code.  DVT Prophylaxis:  IV heparin.    Level of Care: Level of care: Telemetry Cardiac Family Communication:   Disposition Plan:     Remains inpatient appropriate:  CABG NEXT WEEK.   Procedures:  Cardiac catheterization  Consultants:   Cardiothoracic surgery  Cardiology.   Antimicrobials:   Anti-infectives (From admission, onward)    None        Medications  Scheduled Meds:  carvedilol  3.125 mg Oral BID WC   Chlorhexidine Gluconate Cloth  6 each Topical Daily   empagliflozin  10 mg Oral Daily   ezetimibe  10 mg Oral Daily   isosorbide mononitrate  30 mg Oral Daily   omega-3 acid ethyl esters  1 g Oral Daily   pantoprazole  40 mg Oral Daily   polyethylene glycol  17 g Oral Daily   senna-docusate  2 tablet Oral BID   sodium chloride flush  3 mL Intravenous Q12H   sodium chloride flush  3 mL Intravenous Q12H   Continuous Infusions:  sodium chloride     sodium chloride 10 mL/hr at 05/06/22 0600   sodium chloride  heparin 1,250 Units/hr (05/06/22 0600)   PRN Meds:.sodium chloride, sodium chloride, acetaminophen, bisacodyl, hydrALAZINE, nitroGLYCERIN, ondansetron (ZOFRAN) IV, sodium chloride flush, sodium chloride flush    Subjective:   [name] was seen and examined today.  No new complaints.   Objective:   Vitals:   05/06/22 0745 05/06/22 0750 05/06/22 0800 05/06/22 0815  BP:      Pulse:   75 70  Resp: '19  15 14  '$ Temp:  98.2 F (36.8 C)    TempSrc:  Axillary    SpO2:   96% 95%  Weight:      Height:        Intake/Output Summary (Last 24 hours) at 05/06/2022 1112 Last data filed at 05/06/2022 0800 Gross per 24 hour  Intake 677.33 ml  Output 2020 ml  Net -1342.67 ml    Filed Weights   05/03/22 0600 05/04/22 0600 05/05/22 0500  Weight: 81.9 kg 84 kg 84.3 kg     Exam General exam: Appears calm and comfortable  Respiratory system: Clear to auscultation. Respiratory effort normal. Cardiovascular system: S1 & S2 heard, RRR. No JVD,  murmurs,  Gastrointestinal system: Abdomen is nondistended, soft and nontender.  Central nervous system: Alert and oriented. No focal neurological deficits. Extremities: Symmetric 5 x 5 power. Skin: No rashes, Psychiatry: Mood & affect appropriate.    Data Reviewed:  I have personally reviewed following labs and imaging studies   CBC Lab Results  Component Value Date   WBC 5.7 05/06/2022   RBC 4.16 (L) 05/06/2022   HGB 13.3 05/06/2022   HCT 38.4 (L) 05/06/2022   MCV 92.3 05/06/2022   MCH 32.0 05/06/2022   PLT 172 05/06/2022   MCHC 34.6 05/06/2022   RDW 14.9 05/06/2022   LYMPHSABS 1.6 05/02/2022   MONOABS 0.4 05/02/2022   EOSABS 0.2 05/02/2022   BASOSABS 0.0 Q000111Q     Last metabolic panel Lab Results  Component Value Date   NA 140 05/06/2022   K 3.9 05/06/2022   CL 99 05/06/2022   CO2 19 (L) 05/06/2022   BUN 19 05/06/2022   CREATININE 1.21 05/06/2022   GLUCOSE 99 05/06/2022   GFRNONAA >60 05/06/2022   GFRAA >60 08/11/2019   CALCIUM 8.3 (L) 05/06/2022   PROT 6.3 (L) 05/02/2022   ALBUMIN 3.6 05/02/2022   LABGLOB 3.2 06/16/2019   AGRATIO 1.5 06/16/2019   BILITOT 0.8 05/02/2022   ALKPHOS 40 05/02/2022   AST 39 05/02/2022   ALT 43 05/02/2022   ANIONGAP 22 (H) 05/06/2022    CBG (last 3)  No results for input(s): "GLUCAP" in the last 72 hours.     Coagulation Profile: No results for input(s): "INR", "PROTIME" in the last 168 hours.   Radiology Studies: No results found.     Hosie Poisson M.D. [facility] 05/06/2022, 11:12 AM  Available via Epic secure chat 7am-7pm After 7 pm, please refer to night coverage provider listed on amion.

## 2022-05-06 NOTE — TOC Benefit Eligibility Note (Signed)
Patient Advocate Encounter  Insurance verification completed.    The patient is currently admitted and upon discharge could be taking Vascepa 1 g capsule.  The current 30 day co-pay is $0.00.   The patient is insured through [employer] Medicare Part D   Marino Rogerson, CPHT Pharmacy Patient Advocate Specialist [facility] Patient Advocate Team Direct Number: [phone]  Fax: [phone]       

## 2022-05-06 NOTE — Progress Notes (Addendum)
CARDIOLOGY ROUNDING PROGRESS NOTE  Patient Name: [name] Date of Encounter: 05/06/2022  [facility] Cardiologist: Jenkins Rouge, MD   Subjective    HPI: [name] is a 77 y.o. male with a hx of severe multivessel CAD and multiple interventions, ischemic cardiomyopathy, GI bleed with duodenal erosion, HTN, HLD, presenting to [facility] with chest pain and found to have an NSTEMI now s/p cardiac cath with multivessel disease  Principal Problem:   NSTEMI (non-ST elevated myocardial infarction) ([place]) Active Problems:   CAD S/P percutaneous coronary angioplasty   Hyperlipidemia with target low density lipoprotein (LDL) cholesterol less than 70 mg/dL   Hypertension   Ischemic cardiomyopathy   Previous myocardial infarction older than 8 weeks   O/N: No chest pain or SHOB. No concerns; was asking about potentially walking in the hallway.  Feels good.  ROS:  All other ROS reviewed and negative. Pertinent positives noted in the HPI.     Inpatient Medications  Scheduled Meds:  carvedilol  3.125 mg Oral BID WC   Chlorhexidine Gluconate Cloth  6 each Topical Daily   empagliflozin  10 mg Oral Daily   ezetimibe  10 mg Oral Daily   isosorbide mononitrate  30 mg Oral Daily   omega-3 acid ethyl esters  1 g Oral Daily   pantoprazole  40 mg Oral Daily   polyethylene glycol  17 g Oral Daily   senna-docusate  2 tablet Oral BID   sodium chloride flush  3 mL Intravenous Q12H   sodium chloride flush  3 mL Intravenous Q12H   Continuous Infusions:  sodium chloride     sodium chloride 10 mL/hr at 05/06/22 0600   sodium chloride     heparin 1,250 Units/hr (05/06/22 0600)   PRN Meds: sodium chloride, sodium chloride, acetaminophen, bisacodyl, nitroGLYCERIN, ondansetron (ZOFRAN) IV, sodium chloride flush, sodium chloride flush   Vital Signs    Vitals:   05/06/22 0745 05/06/22 0750 05/06/22 0800 05/06/22 0815  BP:      Pulse:   75 70  Resp: '19  15 14  '$ Temp:  98.2 F (36.8 C)     TempSrc:  Axillary    SpO2:   96% 95%  Weight:      Height:        Intake/Output Summary (Last 24 hours) at 05/06/2022 0957 Last data filed at 05/06/2022 0800 Gross per 24 hour  Intake 722.39 ml  Output 2020 ml  Net -1297.61 ml      05/05/2022    5:00 AM 05/04/2022    6:00 AM 05/03/2022    6:00 AM  Last 3 Weights  Weight (lbs) 185 lb 13.6 oz 185 lb 3 oz 180 lb 8.9 oz  Weight (kg) 84.3 kg 84 kg 81.9 kg      Telemetry  Overnight telemetry shows NSR, which I personally reviewed.    Physical Exam   General: Well nourished, well developed, in no acute distress Head: Atraumatic, normal size  Eyes: PEERLA, EOMI  Neck: Supple, no JVD Endocrine: No thryomegaly Cardiac: Normal S1, S2; RRR; no murmurs, rubs, or gallops Lungs: Clear to auscultation bilaterally, no wheezing, rhonchi or rales  Abd: Soft, nontender, no hepatomegaly  Ext: No edema, pulses 2+ Musculoskeletal: No deformities, BUE and BLE strength normal and equal; radial cath site clean dry intact. Skin: Warm and dry, no rashes   Neuro: Alert and oriented to person, place, time, and situation, CNII-XII grossly intact, no focal deficits  Psych: Normal mood and affect   Labs  High Sensitivity Troponin:   Recent Labs  Lab 05/02/22 1514 05/02/22 1714 05/02/22 2117 05/03/22 0147  TROPONINIHS 214* 5,099* 8,341* 8,349*     Chemistry Recent Labs  Lab 05/02/22 1514 05/04/22 0014 05/05/22 0325 05/06/22 0058  NA 138 136 136 140  K 4.3 3.6 3.8 3.9  CL 105 102 103 99  CO2 19* 21* 24 19*  GLUCOSE 129* 124* 120* 99  BUN '17 18 18 19  '$ CREATININE 1.20 1.30* 1.47* 1.21  CALCIUM 9.1 9.1 9.4 8.3*  PROT 6.3*  --   --   --   ALBUMIN 3.6  --   --   --   AST 39  --   --   --   ALT 43  --   --   --   ALKPHOS 40  --   --   --   BILITOT 0.8  --   --   --   GFRNONAA >60 57* 49* >60  ANIONGAP '14 13 9 '$ 22*    Lipids No results for input(s): "CHOL", "TRIG", "HDL", "LABVLDL", "LDLCALC", "CHOLHDL" in the last 168 hours.   Hematology Recent Labs  Lab 05/04/22 0014 05/05/22 0325 05/06/22 0058  WBC 6.4 5.8 5.7  RBC 3.95* 4.23 4.16*  HGB 12.8* 13.3 13.3  HCT 36.6* 38.8* 38.4*  MCV 92.7 91.7 92.3  MCH 32.4 31.4 32.0  MCHC 35.0 34.3 34.6  RDW 15.3 15.2 14.9  PLT 219 209 172   Thyroid No results for input(s): "TSH", "FREET4" in the last 168 hours.  BNPNo results for input(s): "BNP", "PROBNP" in the last 168 hours.  DDimer No results for input(s): "DDIMER" in the last 168 hours.   Radiology    No new studies  Cardiac Studies - Summarized   TTE 05/03/2022: EF 50%.  Mildly aneurysmal and akinetic apex.  No thrombus.  Otherwise no significant WMA.  GR 1 DD.  Unable to assess RV P but RV appears normal.  Mild to moderate mitral stenosis.  Mildly elevated RAP of 8 mmHg.     LHC 05/02/2022 Severe Multi-Vessel CAD:  Prox LAD 75%@ SP1 (just prior to extensive stented segment) Prox-mid LCx ~65% (prox stent edge lesion) with progression of jailed OM2 lesion to 90% (likely Culprit) Patent Prox to mid RCA stents with sequential focal lesions 80%-60-70% lesions & 80%   Known to be moderate Ischemic CM - EF~ 40% (unable to assess regional wall motion abnormalities due to poor filling. Recommend 2D echo to better assess)      2= hypokinesis    Patient Profile  [name] is a 77 y.o. 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  Taken to Cath Lab for ongoing chest pain, found to have three-vessel CAD.  Recommended CABG evaluation. Evaluated by CT surgery.  Plan for CABG on 05/08/2022 with Dr. Coralie Common -Currently on Plavix wash out since 3/8 -> post cath treated  with 6 hours of Aggrastat -Continue aspirin and heparin drip, which is at therapeutic levels -Statin intolerant.  Now on Zetia.  Consider outpatient PCSK9 inhibitor evaluation. -off of nitro gtt, now on IMDUR -Is on stable dose of beta-blocker with as  needed hydralazine in lieu of holding Entresto pre-CABG. -Stable for transfer to floor.  # Acute HFpEF -  EF 50% -LVEDP was elevated at the time of admission.  -Euvolemic on examination. -Continue carvedilol 3.125 mg twice daily -Stop Entresto 48 HR prior to CABG -- Hydralazine added PRN -Lasix PRN -Consider Jardiance and Aldactone as renal function and BP allows   #AKI -Likely in setting of LHC c/b furosemide therapy -CTM  # Hyperlipidemia -Statin intolerant.  Continue on  Zetia 10 mg daily.   -Consider outpatient PCSK9 inhibitor therapy.   #FEN -No intravenous fluids -Patient cleared for gentle walks with assistance/ monitoring for recurrence of symptoms - Diet: Heart healthy - DVT PPx: Heparin drip - Code: Full - Disposition: Transfer orders to the floor placed, awaiting bed.   Discussed current plan with attending, Dr. Ellyn Hack, during rounds.    Romana Juniper, MD [facility] Internal Medicine Program 05/06/2022, 9:57 AM For questions or updates, please contact [place] Please consult [email or web address] for contact info under   ATTENDING ATTESTATION  I have seen, examined and evaluated the patient this morning on rounds along with Dr. Simeon Craft (Resident).  After reviewing all the available data and chart, we discussed the patients laboratory, study & physical findings as well as symptoms in detail.  I agree with her findings, examination as well as impression recommendations as per our discussion.    Attending adjustments noted in italics.  Overall stable.  No major changes besides the plan to hold Entresto pre-CABG.  As needed hydralazine written for that.  Continue current dose of other  medications. Plan is for CABG presumably on 05/08/2022 having allowed time for Plavix washout.  Okay to ambulate in the hallway provide he is not having okay to transfer to telemetry.    Leonie Man, MD, MS Glenetta Hew, M.D., M.S. Interventional Cardiologist  [place]  Pager # [phone] Phone # [phone] [address]. [place], [place] [address]

## 2022-05-06 NOTE — Progress Notes (Signed)
[facility] for Heparin (following 6 hours of Tirofiban) Indication: multi-vessel CAD s/p cath, awaiting CABG  Allergies  Allergen Reactions   Asa [Aspirin] Other (See Comments)    Abdominal bleeding   Penicillins Anaphylaxis    Has patient had a PCN reaction causing immediate rash, facial/tongue/throat swelling, SOB or lightheadedness with hypotension: Yes Has patient had a PCN reaction causing severe rash involving mucus membranes or skin necrosis: No Has patient had a PCN reaction that required hospitalization: No Has patient had a PCN reaction occurring within the last 10 years: No If all of the above answers are "NO", then may proceed with Cephalosporin use.   Statins Other (See Comments)    Stiff joints, muscle tightness, couldn't walk    Patient Measurements: Height: '5\' 7"'$  (170.2 cm) Weight: 84.3 kg (185 lb 13.6 oz) IBW/kg (Calculated) : 66.1 Heparin DQ: 82 kg  Vital Signs: Temp: 97.6 F (36.4 C) (03/12 0306) Temp Source: Axillary (03/12 0306) BP: 117/76 (03/12 0420) Pulse Rate: 44 (03/12 0600)  Labs: Recent Labs    05/04/22 0014 05/04/22 0949 05/05/22 0325 05/06/22 0058  HGB 12.8*  --  13.3 13.3  HCT 36.6*  --  38.8* 38.4*  PLT 219  --  209 172  HEPARINUNFRC 0.35 0.35 0.46 >1.10*  CREATININE 1.30*  --  1.47* 1.21     Estimated Creatinine Clearance: 53.9 mL/min (by C-G formula based on SCr of 1.21 mg/dL).   Medical History: Past Medical History:  Diagnosis Date   Anxiety    Arthritis    "wrists" (07/27/2014)   Coronary artery disease    a.  Pt with 4 stents over the course of 2003-2012;  b.  LHC 6/16:  LAD, RCA, LCx stents patent, OM1 50%, EF 50-55% >> med rx    Elevated LFTs    GERD (gastroesophageal reflux disease)    Hypercholesterolemia    Hypertension    Ischemic cardiomyopathy    cMRI 03/2011:  EF 48% with dist ant, septal, apical and inf-apical dyskinesia, no apical clot, dist ant, apical and septal full  thickness scar >> No ICD needed  //  Limited echo 7/19: Large apical septal aneurysm, severe hypokinesis of the entire apex and mid to apical segments of the anterior wall, anterolateral wall and anterior septum-consistent with infarct and most of the LAD territory; EF 35    Left ventricular aneurysm    Limited echo 7/19: Large apical septal aneurysm, severe hypokinesis of the entire apex and mid to apical segments of the anterior wall, anterolateral wall and anterior septum-consistent with infarct and most of the LAD territory; EF 35   Myocardial infarction [facility]) 2003    Assessment: [name] is a 77 y/o M with chest pain/NSTEMI who went for urgent cath last night. He was found to have severe multi-vessel CAD. Now s/p tirofiban x 6 hours post-cath then re-starting heparin drip. Baseline CBC ok. Renal function ok. Planning CABG after 5 day Plavix washout - scheduled for 05/08/22. No signs or symptoms of bleeding per RN.   Heparin level this AM therapeutic on recheck at 0.40. CBC is stable.  Goal of Therapy:  Heparin level 0.3-0.7 units/ml Monitor platelets by anticoagulation protocol: Yes   Plan:  Cont heparin 1250 units/hr Daily heparin level and CBC  Thank you for involving pharmacy in this patient's care.  Vicenta Dunning, PharmD  PGY1 Pharmacy Resident

## 2022-05-06 NOTE — Progress Notes (Signed)
Pre-CABG study completed.   Please see CV Proc for preliminary results.   Santos Hardwick, RDMS, RVT  

## 2022-05-06 NOTE — Progress Notes (Signed)
Pt received OHS book, Move in the Tube sheet, and OHS careguide. Pt was educated on approx length of surgery and stay, importance of ambulation and using IS, restrictions, home needs, and CRPII.  Christen Bame 05/06/2022 2:35 PM   [id number]

## 2022-05-07 DIAGNOSIS — I214 Non-ST elevation (NSTEMI) myocardial infarction: Secondary | ICD-10-CM

## 2022-05-07 DIAGNOSIS — I252 Old myocardial infarction: Secondary | ICD-10-CM | POA: Diagnosis not present

## 2022-05-07 DIAGNOSIS — E785 Hyperlipidemia, unspecified: Secondary | ICD-10-CM | POA: Diagnosis not present

## 2022-05-07 DIAGNOSIS — I255 Ischemic cardiomyopathy: Secondary | ICD-10-CM | POA: Diagnosis not present

## 2022-05-07 DIAGNOSIS — I25110 Atherosclerotic heart disease of native coronary artery with unstable angina pectoris: Secondary | ICD-10-CM

## 2022-05-07 DIAGNOSIS — I2510 Atherosclerotic heart disease of native coronary artery without angina pectoris: Secondary | ICD-10-CM

## 2022-05-07 DIAGNOSIS — E119 Type 2 diabetes mellitus without complications: Secondary | ICD-10-CM | POA: Diagnosis not present

## 2022-05-07 LAB — CBC
HCT: 35.4 % — ABNORMAL LOW (ref 39.0–52.0)
Hemoglobin: 12.5 g/dL — ABNORMAL LOW (ref 13.0–17.0)
MCH: 32.3 pg (ref 26.0–34.0)
MCHC: 35.3 g/dL (ref 30.0–36.0)
MCV: 91.5 fL (ref 80.0–100.0)
Platelets: 184 10*3/uL (ref 150–400)
RBC: 3.87 MIL/uL — ABNORMAL LOW (ref 4.22–5.81)
RDW: 15 % (ref 11.5–15.5)
WBC: 5.3 10*3/uL (ref 4.0–10.5)
nRBC: 0 % (ref 0.0–0.2)

## 2022-05-07 LAB — TYPE AND SCREEN
ABO/RH(D): O POS
Antibody Screen: NEGATIVE

## 2022-05-07 LAB — ABO/RH: ABO/RH(D): O POS

## 2022-05-07 LAB — BASIC METABOLIC PANEL
Anion gap: 11 (ref 5–15)
BUN: 18 mg/dL (ref 8–23)
CO2: 24 mmol/L (ref 22–32)
Calcium: 9.3 mg/dL (ref 8.9–10.3)
Chloride: 101 mmol/L (ref 98–111)
Creatinine, Ser: 1.45 mg/dL — ABNORMAL HIGH (ref 0.61–1.24)
GFR, Estimated: 50 mL/min — ABNORMAL LOW (ref >60)
Glucose, Bld: 110 mg/dL — ABNORMAL HIGH (ref 70–99)
Potassium: 4 mmol/L (ref 3.5–5.1)
Sodium: 136 mmol/L (ref 135–145)

## 2022-05-07 LAB — HEPARIN LEVEL (UNFRACTIONATED): Heparin Unfractionated: 0.4 IU/mL (ref 0.30–0.70)

## 2022-05-07 LAB — HEMOGLOBIN A1C
Hgb A1c MFr Bld: 7 % — ABNORMAL HIGH (ref 4.8–5.6)
Mean Plasma Glucose: 154 mg/dL

## 2022-05-07 LAB — SARS CORONAVIRUS 2 (TAT 6-24 HRS): SARS Coronavirus 2: NEGATIVE

## 2022-05-07 LAB — LIPOPROTEIN A (LPA): Lipoprotein (a): 20.5 nmol/L (ref <75.0)

## 2022-05-07 MED ORDER — PLASMA-LYTE A IV SOLN
INTRAVENOUS | Status: DC
  Filled 2022-05-07: qty 2.5

## 2022-05-07 MED ORDER — VANCOMYCIN HCL 1500 MG/300ML IV SOLN
1500.0000 mg | INTRAVENOUS | Status: AC
  Administered 2022-05-08: 1500 mg via INTRAVENOUS
  Filled 2022-05-07: qty 300

## 2022-05-07 MED ORDER — NITROGLYCERIN IN D5W 200-5 MCG/ML-% IV SOLN
2.0000 ug/min | INTRAVENOUS | Status: DC
  Filled 2022-05-07: qty 250

## 2022-05-07 MED ORDER — NOREPINEPHRINE 4 MG/250ML-% IV SOLN
0.0000 ug/min | INTRAVENOUS | Status: DC
  Filled 2022-05-07: qty 250

## 2022-05-07 MED ORDER — METOPROLOL TARTRATE 12.5 MG HALF TABLET
12.5000 mg | ORAL_TABLET | Freq: Once | ORAL | Status: AC
  Administered 2022-05-08: 12.5 mg via ORAL
  Filled 2022-05-07: qty 1

## 2022-05-07 MED ORDER — CHLORHEXIDINE GLUCONATE 0.12 % MT SOLN
15.0000 mL | Freq: Once | OROMUCOSAL | Status: AC
  Administered 2022-05-08: 15 mL via OROMUCOSAL
  Filled 2022-05-07: qty 15

## 2022-05-07 MED ORDER — TRANEXAMIC ACID 1000 MG/10ML IV SOLN
1.5000 mg/kg/h | INTRAVENOUS | Status: AC
  Administered 2022-05-08: 1.5 mg/kg/h via INTRAVENOUS
  Filled 2022-05-07: qty 25

## 2022-05-07 MED ORDER — TEMAZEPAM 7.5 MG PO CAPS
15.0000 mg | ORAL_CAPSULE | Freq: Once | ORAL | Status: AC | PRN
  Administered 2022-05-07: 15 mg via ORAL
  Filled 2022-05-07: qty 2

## 2022-05-07 MED ORDER — HEPARIN 30,000 UNITS/1000 ML (OHS) CELLSAVER SOLUTION
Status: DC
  Filled 2022-05-07: qty 1000

## 2022-05-07 MED ORDER — TRANEXAMIC ACID (OHS) PUMP PRIME SOLUTION
2.0000 mg/kg | INTRAVENOUS | Status: DC
  Filled 2022-05-07: qty 1.69

## 2022-05-07 MED ORDER — DEXMEDETOMIDINE HCL IN NACL 400 MCG/100ML IV SOLN
0.1000 ug/kg/h | INTRAVENOUS | Status: AC
  Administered 2022-05-08: .7 ug/kg/h via INTRAVENOUS
  Filled 2022-05-07: qty 100

## 2022-05-07 MED ORDER — PHENYLEPHRINE HCL-NACL 20-0.9 MG/250ML-% IV SOLN
30.0000 ug/min | INTRAVENOUS | Status: AC
  Administered 2022-05-08: 25 ug/min via INTRAVENOUS
  Filled 2022-05-07: qty 250

## 2022-05-07 MED ORDER — LEVOFLOXACIN IN D5W 500 MG/100ML IV SOLN
500.0000 mg | INTRAVENOUS | Status: AC
  Administered 2022-05-08: 500 mg via INTRAVENOUS
  Filled 2022-05-07: qty 100

## 2022-05-07 MED ORDER — MAGNESIUM SULFATE 50 % IJ SOLN
40.0000 meq | INTRAMUSCULAR | Status: DC
  Filled 2022-05-07: qty 9.85

## 2022-05-07 MED ORDER — POTASSIUM CHLORIDE 2 MEQ/ML IV SOLN
80.0000 meq | INTRAVENOUS | Status: DC
  Filled 2022-05-07: qty 40

## 2022-05-07 MED ORDER — CHLORHEXIDINE GLUCONATE CLOTH 2 % EX PADS
6.0000 | MEDICATED_PAD | Freq: Once | CUTANEOUS | Status: AC
  Administered 2022-05-07: 6 via TOPICAL

## 2022-05-07 MED ORDER — INSULIN REGULAR(HUMAN) IN NACL 100-0.9 UT/100ML-% IV SOLN
INTRAVENOUS | Status: AC
  Administered 2022-05-08: 1 [IU]/h via INTRAVENOUS
  Filled 2022-05-07: qty 100

## 2022-05-07 MED ORDER — EPINEPHRINE HCL 5 MG/250ML IV SOLN IN NS
0.0000 ug/min | INTRAVENOUS | Status: DC
  Filled 2022-05-07: qty 250

## 2022-05-07 MED ORDER — MILRINONE LACTATE IN DEXTROSE 20-5 MG/100ML-% IV SOLN
0.3000 ug/kg/min | INTRAVENOUS | Status: DC
  Filled 2022-05-07: qty 100

## 2022-05-07 MED ORDER — CHLORHEXIDINE GLUCONATE CLOTH 2 % EX PADS
6.0000 | MEDICATED_PAD | Freq: Once | CUTANEOUS | Status: AC
  Administered 2022-05-08: 6 via TOPICAL

## 2022-05-07 MED ORDER — TRANEXAMIC ACID (OHS) BOLUS VIA INFUSION
15.0000 mg/kg | INTRAVENOUS | Status: AC
  Administered 2022-05-08: 1264.5 mg via INTRAVENOUS
  Filled 2022-05-07: qty 1265

## 2022-05-07 MED ORDER — VANCOMYCIN HCL 1000 MG IV SOLR
INTRAVENOUS | Status: DC
  Filled 2022-05-07: qty 20

## 2022-05-07 MED ORDER — BISACODYL 5 MG PO TBEC: 5.0000 mg | DELAYED_RELEASE_TABLET | Freq: Once | ORAL | Status: DC

## 2022-05-07 NOTE — Progress Notes (Signed)
PROGRESS NOTE  [name] [id number] DOB: 1945-10-21   PCP: Velna Hatchet, MD  Patient is from: Home  DOA: 05/02/2022 LOS: 5  Chief complaints Chief Complaint  Patient presents with   Chest Pain     Brief Narrative / Interim history: 77 y.o. 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  He underwent cardiac cath showing multi vessel disease. Cardiothoracic surgery consulted and he is scheduled for CABG on 3/14.   Subjective: Seen and examined earlier this morning.  No major events overnight of this morning.  No complaints.  Objective: Vitals:   05/07/22 0830 05/07/22 0900 05/07/22 1000 05/07/22 1005  BP:  117/70 121/74 121/74  Pulse:  80 69 68  Resp:  18 10   Temp: 97.7 F (36.5 C)     TempSrc: Oral     SpO2:  92% 94%   Weight:      Height:        Examination:  GENERAL: No apparent distress.  Nontoxic. HEENT: MMM.  Vision and hearing grossly intact.  NECK: Supple.  No apparent JVD.  RESP:  No IWOB.  Fair aeration bilaterally. CVS:  RRR. Heart sounds normal.  ABD/GI/GU: BS+. Abd soft, NTND.  MSK/EXT:  Moves extremities. No apparent deformity. No edema.  SKIN: no apparent skin lesion or wound NEURO: Awake, alert and oriented appropriately.  No apparent focal neuro deficit. PSYCH: Calm. Normal affect.   Procedures:  3/8-cardiac catheterization  3/11-pulmonary function test  Assessment and plan: Principal Problem:   NSTEMI (non-ST elevated myocardial infarction) ([place]) Active Problems:   Hypertension   Ischemic cardiomyopathy   Previous myocardial infarction older than 8 weeks   CAD S/P percutaneous coronary angioplasty   Hyperlipidemia with target low density lipoprotein (LDL) cholesterol less than 70  mg/dL  NSTEMI in patient with prior history of advanced CAD/recurrent MI/ICM -LHC on 3/8 with multivessel CAD -Plan for CABG after plavix washout.  -Cardiology and CVTS managing-continue heparin, Coreg, Imdur   Hypertension: Normotensive. -Continue with COREG 3.125 mg BID, Imdur 30 mg daily   Hyperlipidemia -intolerant to statin; cardiology recommends discussion of Nexletol outpatient    Renal insufficiency: Likely from contrast and Entresto.  AKI ruled out.   Recent Labs    05/02/22 1514 05/04/22 0014 05/05/22 0325 05/06/22 0058 05/07/22 0022  BUN '17 18 18 19 18  '$ CREATININE 1.20 1.30* 1.47* 1.21 1.45*  -Entresto discontinued -Recheck in the morning  NIDDM-2: A1c 7.0%. Recent Labs  Lab 05/03/22 0023  GLUCAP 113*  -Continue Jardiance  Constipation: Seems to have resolved. -Continue bowel regimen  Body mass index is 29.11 kg/m.          DVT prophylaxis:  On full dose anticoagulation.  Code Status: Full code Family Communication: None at bedside Level of care: Telemetry Cardiac Status is: Inpatient Remains inpatient appropriate because: Non-STEMI   Final disposition: TBD Consultants:  Cardiology CVTS  35 minutes with more than 50% spent in reviewing records, counseling patient/family and coordinating care.   Sch Meds:  Scheduled Meds:  bisacodyl  5 mg Oral Once   carvedilol  3.125 mg Oral BID WC   [START ON 05/08/2022] chlorhexidine  15 mL Mouth/Throat Once   Chlorhexidine Gluconate Cloth  6 each Topical Daily   Chlorhexidine Gluconate Cloth  6  each Topical Once   And   Chlorhexidine Gluconate Cloth  6 each Topical Once   empagliflozin  10 mg Oral Daily   [START ON 05/08/2022] epinephrine  0-10 mcg/min Intravenous To OR   ezetimibe  10 mg Oral Daily   [START ON 05/08/2022] heparin sodium (porcine) 2,500 Units, papaverine 30 mg in electrolyte-A (PLASMALYTE-A PH 7.4) 500 mL irrigation   Irrigation To OR   [START ON 05/08/2022] insulin   Intravenous To  OR   isosorbide mononitrate  30 mg Oral Daily   [START ON 05/08/2022] magnesium sulfate  40 mEq Other To OR   [START ON 05/08/2022] metoprolol tartrate  12.5 mg Oral Once   omega-3 acid ethyl esters  1 g Oral Daily   pantoprazole  40 mg Oral Daily   [START ON 05/08/2022] phenylephrine  30-200 mcg/min Intravenous To OR   polyethylene glycol  17 g Oral Daily   [START ON 05/08/2022] potassium chloride  80 mEq Other To OR   senna-docusate  2 tablet Oral BID   sodium chloride flush  3 mL Intravenous Q12H   sodium chloride flush  3 mL Intravenous Q12H   [START ON 05/08/2022] tranexamic acid  15 mg/kg Intravenous To OR   [START ON 05/08/2022] tranexamic acid  2 mg/kg Intracatheter To OR   [START ON 05/08/2022] vancomycin (VANCOCIN) 1,000 mg in sodium chloride 0.9 % 1,000 mL irrigation   Irrigation To OR   Continuous Infusions:  sodium chloride     sodium chloride 10 mL/hr at 05/07/22 1000   sodium chloride     [START ON 05/08/2022] dexmedetomidine     [START ON 05/08/2022] heparin 30,000 units/NS 1000 mL solution for CELLSAVER     heparin 1,250 Units/hr (05/07/22 1000)   [START ON 05/08/2022] levofloxacin (LEVAQUIN) IV     [START ON 05/08/2022] milrinone     [START ON 05/08/2022] nitroGLYCERIN     [START ON 05/08/2022] norepinephrine     [START ON 05/08/2022] tranexamic acid (CYKLOKAPRON) 2,500 mg in sodium chloride 0.9 % 250 mL (10 mg/mL) infusion     [START ON 05/08/2022] vancomycin     PRN Meds:.sodium chloride, sodium chloride, acetaminophen, bisacodyl, hydrALAZINE, nitroGLYCERIN, ondansetron (ZOFRAN) IV, sodium chloride flush, sodium chloride flush, temazepam  Antimicrobials: Anti-infectives (From admission, onward)    Start     Dose/Rate Route Frequency Ordered Stop   05/08/22 0400  vancomycin (VANCOREADY) IVPB 1500 mg/300 mL        1,500 mg 150 mL/hr over 120 Minutes Intravenous To Surgery 05/07/22 1021 05/09/22 0400   05/08/22 0400  vancomycin (VANCOCIN) 1,000 mg in sodium chloride 0.9 %  1,000 mL irrigation         Irrigation To Surgery 05/07/22 1021 05/09/22 0400   05/08/22 0400  levofloxacin (LEVAQUIN) IVPB 500 mg        500 mg 100 mL/hr over 60 Minutes Intravenous To Surgery 05/07/22 1021 05/09/22 0400        I have personally reviewed the following labs and images: CBC: Recent Labs  Lab 05/02/22 1514 05/03/22 0147 05/04/22 0014 05/05/22 0325 05/06/22 0058 05/07/22 0022  WBC 7.1 6.0 6.4 5.8 5.7 5.3  NEUTROABS 4.9  --   --   --   --   --   HGB 12.9* 12.9* 12.8* 13.3 13.3 12.5*  HCT 39.2 37.5* 36.6* 38.8* 38.4* 35.4*  MCV 94.7 91.5 92.7 91.7 92.3 91.5  PLT 243 223 219 209 172 184   BMP &GFR Recent Labs  Lab 05/02/22 1514  05/04/22 0014 05/05/22 0325 05/06/22 0058 05/07/22 0022  NA 138 136 136 140 136  K 4.3 3.6 3.8 3.9 4.0  CL 105 102 103 99 101  CO2 19* 21* 24 19* 24  GLUCOSE 129* 124* 120* 99 110*  BUN '17 18 18 19 18  '$ CREATININE 1.20 1.30* 1.47* 1.21 1.45*  CALCIUM 9.1 9.1 9.4 8.3* 9.3   Estimated Creatinine Clearance: 45 mL/min (A) (by C-G formula based on SCr of 1.45 mg/dL (H)). Liver & Pancreas: Recent Labs  Lab 05/02/22 1514  AST 39  ALT 43  ALKPHOS 40  BILITOT 0.8  PROT 6.3*  ALBUMIN 3.6   No results for input(s): "LIPASE", "AMYLASE" in the last 168 hours. No results for input(s): "AMMONIA" in the last 168 hours. Diabetic: Recent Labs    05/06/22 0746  HGBA1C 7.0*   Recent Labs  Lab 05/03/22 0023  GLUCAP 113*   Cardiac Enzymes: No results for input(s): "CKTOTAL", "CKMB", "CKMBINDEX", "TROPONINI" in the last 168 hours. No results for input(s): "PROBNP" in the last 8760 hours. Coagulation Profile: No results for input(s): "INR", "PROTIME" in the last 168 hours. Thyroid Function Tests: No results for input(s): "TSH", "T4TOTAL", "FREET4", "T3FREE", "THYROIDAB" in the last 72 hours. Lipid Profile: No results for input(s): "CHOL", "HDL", "LDLCALC", "TRIG", "CHOLHDL", "LDLDIRECT" in the last 72 hours. Anemia Panel: No  results for input(s): "VITAMINB12", "FOLATE", "FERRITIN", "TIBC", "IRON", "RETICCTPCT" in the last 72 hours. Urine analysis:    Component Value Date/Time   COLORURINE YELLOW 08/09/2019 0446   APPEARANCEUR CLEAR 08/09/2019 0446   LABSPEC 1.045 (H) 08/09/2019 0446   PHURINE 5.0 08/09/2019 0446   GLUCOSEU NEGATIVE 08/09/2019 0446   HGBUR MODERATE (A) 08/09/2019 0446   BILIRUBINUR NEGATIVE 08/09/2019 0446   KETONESUR NEGATIVE 08/09/2019 0446   PROTEINUR NEGATIVE 08/09/2019 0446   UROBILINOGEN 1.0 10/22/2013 1225   NITRITE NEGATIVE 08/09/2019 0446   LEUKOCYTESUR NEGATIVE 08/09/2019 0446   Sepsis Labs: Invalid input(s): "PROCALCITONIN", "LACTICIDVEN"  Microbiology: Recent Results (from the past 240 hour(s))  MRSA Next Gen by PCR, Nasal     Status: None   Collection Time: 05/03/22 12:35 AM   Specimen: Nasal Mucosa; Nasal Swab  Result Value Ref Range Status   MRSA by PCR Next Gen NOT DETECTED NOT DETECTED Final    Comment: (NOTE) The GeneXpert MRSA Assay (FDA approved for NASAL specimens only), is one component of a comprehensive MRSA colonization surveillance program. It is not intended to diagnose MRSA infection nor to guide or monitor treatment for MRSA infections. Test performance is not FDA approved in patients less than 69 years old. Performed at [facility], [place] [address] [place], [place] [address]   Surgical PCR screen     Status: None   Collection Time: 05/05/22  8:29 AM   Specimen: Nasal Mucosa; Nasal Swab  Result Value Ref Range Status   MRSA, PCR NEGATIVE NEGATIVE Final   Staphylococcus aureus NEGATIVE NEGATIVE Final    Comment: (NOTE) The Xpert SA Assay (FDA approved for NASAL specimens in patients 47 years of age and older), is one component of a comprehensive surveillance program. It is not intended to diagnose infection nor to guide or monitor treatment. Performed at [facility], [place] [address], [place], [place] [address]     Radiology  Studies: VAS US DOPPLER PRE CABG  Result Date: 05/07/2022 PREOPERATIVE VASCULAR EVALUATION Patient Name:  [name]  Date of Exam:   05/06/2022 Medical Rec #: [id number]  Accession #:    [id number] Date of Birth: Jun 26, 1945       Patient Gender: M Patient Age:   18 years Exam Location:  [facility] Procedure:      VAS US DOPPLER PRE CABG Referring Phys: STEVEN HENDRICKSON --------------------------------------------------------------------------------  Indications:  Pre-CABG. Risk Factors: Hypertension, hyperlipidemia, past history of smoking, prior MI,               coronary artery disease. Performing Technologist: Darlin Coco RDMS, RVT  Examination Guidelines: A complete evaluation includes B-mode imaging, spectral Doppler, color Doppler, and power Doppler as needed of all accessible portions of each vessel. Bilateral testing is considered an integral part of a complete examination. Limited examinations for reoccurring indications may be performed as noted.  Right Carotid Findings: +----------+--------+--------+--------+------------+--------+           PSV cm/sEDV cm/sStenosisDescribe    Comments +----------+--------+--------+--------+------------+--------+ CCA Prox  78      11                                   +----------+--------+--------+--------+------------+--------+ CCA Distal94      11                                   +----------+--------+--------+--------+------------+--------+ ICA Prox  91      19      1-39%   heterogenous         +----------+--------+--------+--------+------------+--------+ ICA Mid   95      21                                   +----------+--------+--------+--------+------------+--------+ ICA Distal64      19                                   +----------+--------+--------+--------+------------+--------+ ECA       117                                          +----------+--------+--------+--------+------------+--------+  +----------+--------+-------+----------------+------------+           PSV cm/sEDV cmsDescribe        Arm Pressure +----------+--------+-------+----------------+------------+ [id number]            Multiphasic, WNL             +----------+--------+-------+----------------+------------+ +---------+--------+--+--------+-+---------+ VertebralPSV cm/s25EDV cm/s7Antegrade +---------+--------+--+--------+-+---------+ Left Carotid Findings: +----------+--------+--------+--------+------------+--------+           PSV cm/sEDV cm/sStenosisDescribe    Comments +----------+--------+--------+--------+------------+--------+ CCA Prox  113     10                                   +----------+--------+--------+--------+------------+--------+ CCA Distal130     13                                   +----------+--------+--------+--------+------------+--------+ ICA Prox  104     16      1-39%   heterogenous         +----------+--------+--------+--------+------------+--------+  ICA Mid   65      14                                   +----------+--------+--------+--------+------------+--------+ ICA Distal61      15                                   +----------+--------+--------+--------+------------+--------+ ECA       101                                          +----------+--------+--------+--------+------------+--------+ +----------+--------+--------+----------------+------------+ SubclavianPSV cm/sEDV cm/sDescribe        Arm Pressure +----------+--------+--------+----------------+------------+           214             Multiphasic, WNL             +----------+--------+--------+----------------+------------+ +---------+--------+--+--------+-+---------+ VertebralPSV cm/s48EDV cm/s7Antegrade +---------+--------+--+--------+-+---------+  ABI Findings: +--------+------------------+-----+---------+--------+ Right   Rt Pressure (mmHg)IndexWaveform  Comment  +--------+------------------+-----+---------+--------+ Brachial                       triphasic         +--------+------------------+-----+---------+--------+ PTA                            triphasic         +--------+------------------+-----+---------+--------+ DP                             triphasic         +--------+------------------+-----+---------+--------+ +--------+------------------+-----+---------+-------+ Left    Lt Pressure (mmHg)IndexWaveform Comment +--------+------------------+-----+---------+-------+ Brachial                       triphasic        +--------+------------------+-----+---------+-------+ PTA                            triphasic        +--------+------------------+-----+---------+-------+ DP                             triphasic        +--------+------------------+-----+---------+-------+  Right Doppler Findings: +--------+--------+-----+---------+--------+ Site    PressureIndexDoppler  Comments +--------+--------+-----+---------+--------+ Brachial             triphasic         +--------+--------+-----+---------+--------+ Radial               triphasic         +--------+--------+-----+---------+--------+ Ulnar                triphasic         +--------+--------+-----+---------+--------+  Left Doppler Findings: +--------+--------+-----+---------+--------+ Site    PressureIndexDoppler  Comments +--------+--------+-----+---------+--------+ Brachial             triphasic         +--------+--------+-----+---------+--------+ Radial               triphasic         +--------+--------+-----+---------+--------+ Ulnar  triphasic         +--------+--------+-----+---------+--------+   Summary: Right Carotid: Velocities in the right ICA are consistent with a 1-39% stenosis. Left Carotid: Velocities in the left ICA are consistent with a 1-39% stenosis. Vertebrals:  Bilateral vertebral  arteries demonstrate antegrade flow. Subclavians: Normal flow hemodynamics were seen in bilateral subclavian              arteries. Right ABI: Triphasic PTA and DPA waveforms. Left ABI: Triphasic PTA and DPA waveforms.  Right Upper Extremity: Doppler waveforms remain within normal limits with right radial compression. Doppler waveforms remain within normal limits with right ulnar compression. Left Upper Extremity: Doppler waveforms remain within normal limits with left radial compression. Doppler waveforms remain within normal limits with left ulnar compression.  Electronically signed by Monica Martinez MD on 05/07/2022 at 9:48:13 AM.    Final       [address] [place]  If 7PM-7AM, please contact night-coverage [email or web address] 05/07/2022, 11:37 AM

## 2022-05-07 NOTE — Progress Notes (Signed)
CARDIAC REHAB PHASE I   Pt eating lunch, feeling well today. Reviewed pre-op [facility] education provided yesterday. No questions or concerns. Ready to have it done and begin recover. Pt would like to walk after lunch. Asked Ovid Curd RN to assist with ambulation when pt ready. Will continue to follow.   [id number] Vanessa Barbara, RN BSN 05/07/2022 12:03 PM

## 2022-05-07 NOTE — Progress Notes (Signed)
[place] for Heparin (following 6 hours of Tirofiban) Indication: multi-vessel CAD s/p cath, awaiting CABG  Allergies  Allergen Reactions   Asa [Aspirin] Other (See Comments)    Abdominal bleeding   Penicillins Anaphylaxis    Has patient had a PCN reaction causing immediate rash, facial/tongue/throat swelling, SOB or lightheadedness with hypotension: Yes Has patient had a PCN reaction causing severe rash involving mucus membranes or skin necrosis: No Has patient had a PCN reaction that required hospitalization: No Has patient had a PCN reaction occurring within the last 10 years: No If all of the above answers are "NO", then may proceed with Cephalosporin use.   Statins Other (See Comments)    Stiff joints, muscle tightness, couldn't walk    Patient Measurements: Height: '5\' 7"'$  (170.2 cm) Weight: 84.3 kg (185 lb 13.6 oz) IBW/kg (Calculated) : 66.1 Heparin DQ: 82 kg  Vital Signs: Temp: 97.7 F (36.5 C) (03/13 0830) Temp Source: Oral (03/13 0830) BP: 130/96 (03/13 0637) Pulse Rate: 46 (03/13 0700)  Labs: Recent Labs    05/05/22 0325 05/06/22 0058 05/06/22 0746 05/07/22 0022  HGB 13.3 13.3  --  12.5*  HCT 38.8* 38.4*  --  35.4*  PLT 209 172  --  184  HEPARINUNFRC 0.46 >1.10* 0.40 0.40  CREATININE 1.47* 1.21  --  1.45*     Estimated Creatinine Clearance: 45 mL/min (A) (by C-G formula based on SCr of 1.45 mg/dL (H)).   Medical History: Past Medical History:  Diagnosis Date   Anxiety    Arthritis    "wrists" (07/27/2014)   Coronary artery disease    a.  Pt with 4 stents over the course of 2003-2012;  b.  LHC 6/16:  LAD, RCA, LCx stents patent, OM1 50%, EF 50-55% >> med rx    Elevated LFTs    GERD (gastroesophageal reflux disease)    Hypercholesterolemia    Hypertension    Ischemic cardiomyopathy    cMRI 03/2011:  EF 48% with dist ant, septal, apical and inf-apical dyskinesia, no apical clot, dist ant, apical and septal full  thickness scar >> No ICD needed  //  Limited echo 7/19: Large apical septal aneurysm, severe hypokinesis of the entire apex and mid to apical segments of the anterior wall, anterolateral wall and anterior septum-consistent with infarct and most of the LAD territory; EF 35    Left ventricular aneurysm    Limited echo 7/19: Large apical septal aneurysm, severe hypokinesis of the entire apex and mid to apical segments of the anterior wall, anterolateral wall and anterior septum-consistent with infarct and most of the LAD territory; EF 35   Myocardial infarction [facility]) 2003    Assessment: [name] is a 77 y/o M with chest pain/NSTEMI who went for urgent cath last night. He was found to have severe multi-vessel CAD. Now s/p tirofiban x 6 hours post-cath then re-starting heparin drip. Baseline CBC ok. Renal function ok. Planning CABG after 5 day Plavix washout - scheduled for 05/08/22. No signs or symptoms of bleeding per RN.   Heparin level this AM therapeutic on recheck at 0.40. CBC mostly stable with hemoglobin down slightly from yesterday.   Goal of Therapy:  Heparin level 0.3-0.7 units/ml Monitor platelets by anticoagulation protocol: Yes   Plan:  Cont heparin 1250 units/hr Daily heparin level and CBC  Thank you for involving pharmacy in this patient's care.  Vicenta Dunning, PharmD  PGY1 Pharmacy Resident   ADDENDUM: Heparin level therapeutic this morning  at 0.32 and then turned off shortly after for planned CABG procedure.

## 2022-05-07 NOTE — TOC Initial Note (Addendum)
Transition of Care [facility]) - Initial/Assessment Note    Patient Details  Name: [name] MRN: [id number] Date of Birth: [birth year]  Transition of Care [facility]) CM/SW Contact:    Bethena Roys, RN Phone Number: 05/07/2022, 12:05 PM  Clinical Narrative: Patient presented for chest pain- Post LHC-Plavix washout. Plan for CABG 05-08-22. PTA patient was independent from home with significant other [name] and has support of daughter [name]. Patient state he may need a rolling walker and bedside commode once stable to transition home. Case Manager will continue to follow for transition of care needs as the patient progresses.                  Expected Discharge Plan: Home/Self Care Barriers to Discharge: Continued Medical Work up   Patient Goals and CMS Choice Patient states their goals for this hospitalization and ongoing recovery are:: to return home.   Expected Discharge Plan and Services In-house Referral: NA Discharge Planning Services: CM Consult Post Acute Care Choice: NA Living arrangements for the past 2 months: Single Family Home                   DME Agency: NA       HH Arranged: NA   Prior Living Arrangements/Services Living arrangements for the past 2 months: Single Family Home Lives with:: Self, Significant Other [name].) Patient language and need for interpreter reviewed:: Yes Do you feel safe going back to the place where you live?: Yes      Need for Family Participation in Patient Care: Yes (Comment) Care giver support system in place?: Yes (comment)   Criminal Activity/Legal Involvement Pertinent to Current Situation/Hospitalization: No - Comment as needed  Activities of Daily Living Home Assistive Devices/Equipment: None ADL Screening (condition at time of admission) Patient's cognitive ability adequate to safely complete daily activities?: Yes Is the patient deaf or have difficulty hearing?: No Does the patient have difficulty seeing,  even when wearing glasses/contacts?: No Does the patient have difficulty concentrating, remembering, or making decisions?: No Patient able to express need for assistance with ADLs?: Yes Does the patient have difficulty dressing or bathing?: No Independently performs ADLs?: Yes (appropriate for developmental age) Does the patient have difficulty walking or climbing stairs?: No Weakness of Legs: None Weakness of Arms/Hands: None  Permission Sought/Granted Permission sought to share information with : Family Supports, Case Manager   Emotional Assessment Appearance:: Appears stated age Attitude/Demeanor/Rapport: Engaged Affect (typically observed): Appropriate Orientation: : Oriented to Self, Oriented to Place, Oriented to  Time, Oriented to Situation Alcohol / Substance Use: Not Applicable Psych Involvement: No (comment)  Admission diagnosis:  NSTEMI (non-ST elevated myocardial infarction) ([place]) [I21.4] Patient Active Problem List   Diagnosis Date Noted   Non-insulin dependent type 2 diabetes mellitus ([place]) [id number]   Chronic systolic congestive heart failure ([place]) 05/02/2022   NSTEMI (non-ST elevated myocardial infarction) ([place]) 05/02/2022   Hyperlipidemia 05/02/2022   Sepsis ([place]) 08/09/2019   History of GI bleed 11/03/2017   CKD (chronic kidney disease), stage III ([place]) 10/19/2017   Diverticulitis 10/19/2017   Gastro-esophageal reflux disease without esophagitis 06/04/2015   Hearing loss 01/04/2015   Chest pain    Low testosterone 06/02/2014   Irritable bowel syndrome 11/16/2013   Unstable angina ([place]) 06/11/2012   CAD S/P percutaneous coronary angioplasty 01/07/2011    Class: Chronic   Angina pectoris 01/07/2011    Class: Acute   Hyperlipidemia with target low density lipoprotein (LDL) cholesterol less than 70 mg/dL 01/07/2011  Class: Chronic   Hypertension 01/07/2011    Class: Chronic   Ischemic cardiomyopathy 01/07/2011    Class: Chronic   Previous myocardial  infarction older than 8 weeks 01/07/2011    Class: History of   PCP:  Velna Hatchet, MD Pharmacy:   [id number] [place], [id number]: [phone]: [phone] Social Determinants of Health (SDOH) Social History: SDOH Screenings   Food Insecurity: No Food Insecurity (05/05/2022)  Housing: Low Risk  (05/05/2022)  Transportation Needs: No Transportation Needs (05/05/2022)  Utilities: Not At Risk (05/05/2022)  Depression (PHQ2-9): Low Risk  (04/02/2018)  Tobacco Use: Medium Risk (05/05/2022)   SDOH Interventions: Food Insecurity Interventions: Intervention Not Indicated Housing Interventions: Intervention Not Indicated Transportation Interventions: Intervention Not Indicated Utilities Interventions: Intervention Not Indicated   Readmission Risk Interventions     No data to display

## 2022-05-07 NOTE — Progress Notes (Cosign Needed)
CARDIOLOGY ROUNDING PROGRESS NOTE  Patient Name: [name] Date of Encounter: 05/08/2022  [facility] Cardiologist: Jenkins Rouge, MD   Subjective    HPI: [name] is a 77 y.o. male with a hx of severe multivessel CAD and multiple interventions, ischemic cardiomyopathy, GI bleed with duodenal erosion, HTN, HLD, presenting to [facility] with chest pain and found to have an NSTEMI now s/p cardiac cath with multivessel disease  Principal Problem:   NSTEMI (non-ST elevated myocardial infarction) [facility]) Active Problems:   Coronary artery disease involving native coronary artery with unstable angina pectoris ([place])   Ischemic cardiomyopathy   CAD S/P percutaneous coronary angioplasty   Previous myocardial infarction older than 8 weeks   Hyperlipidemia with target low density lipoprotein (LDL) cholesterol less than 70 mg/dL   Hypertension   Non-insulin dependent type 2 diabetes mellitus ([place])   O/N: No pain or shortness of breath this morning. Able to ambulate gently with assistance without overt SOB or lightheadedness. No nausea, vomiting, or lightheadedness.   ROS:  All other ROS reviewed and negative. Pertinent positives noted in the HPI.     Inpatient Medications  Scheduled Meds:  bisacodyl  5 mg Oral Once   carvedilol  3.125 mg Oral BID WC   chlorhexidine  15 mL Mouth/Throat Once   Chlorhexidine Gluconate Cloth  6 each Topical Daily   Chlorhexidine Gluconate Cloth  6 each Topical Once   empagliflozin  10 mg Oral Daily   epinephrine  0-10 mcg/min Intravenous To OR   ezetimibe  10 mg Oral Daily   heparin sodium (porcine) 2,500 Units, papaverine 30 mg in electrolyte-A (PLASMALYTE-A PH 7.4) 500 mL irrigation   Irrigation To OR   insulin   Intravenous To OR   isosorbide mononitrate  30 mg Oral Daily   magnesium sulfate  40 mEq Other To OR   metoprolol tartrate  12.5 mg Oral Once   omega-3 acid ethyl esters  1 g Oral Daily   pantoprazole  40 mg Oral Daily   phenylephrine   30-200 mcg/min Intravenous To OR   polyethylene glycol  17 g Oral Daily   potassium chloride  80 mEq Other To OR   senna-docusate  2 tablet Oral BID   sodium chloride flush  3 mL Intravenous Q12H   sodium chloride flush  3 mL Intravenous Q12H   tranexamic acid  15 mg/kg Intravenous To OR   tranexamic acid  2 mg/kg Intracatheter To OR   vancomycin (VANCOCIN) 1,000 mg in sodium chloride 0.9 % 1,000 mL irrigation   Irrigation To OR   Continuous Infusions:  sodium chloride     sodium chloride 10 mL/hr at 05/07/22 2300   sodium chloride     dexmedetomidine     heparin 30,000 units/NS 1000 mL solution for CELLSAVER     heparin 1,250 Units/hr (05/07/22 2300)   levofloxacin (LEVAQUIN) IV     milrinone     nitroGLYCERIN     norepinephrine     tranexamic acid (CYKLOKAPRON) 2,500 mg in sodium chloride 0.9 % 250 mL (10 mg/mL) infusion     vancomycin     PRN Meds: sodium chloride, sodium chloride, acetaminophen, bisacodyl, hydrALAZINE, nitroGLYCERIN, ondansetron (ZOFRAN) IV, sodium chloride flush, sodium chloride flush   Vital Signs    Vitals:   05/07/22 2000 05/07/22 2100 05/07/22 2200 05/07/22 2300  BP: 123/65     Pulse: 69 68 62 (!) 57  Resp: '18 18 17 19  '$ Temp: 97.9 F (36.6 C)  TempSrc: Oral     SpO2: 92% 93% 94% 90%  Weight:      Height:        Intake/Output Summary (Last 24 hours) at 05/08/2022 0007 Last data filed at 05/07/2022 2300 Gross per 24 hour  Intake 515.92 ml  Output 1525 ml  Net -1009.08 ml      05/05/2022    5:00 AM 05/04/2022    6:00 AM 05/03/2022    6:00 AM  Last 3 Weights  Weight (lbs) 185 lb 13.6 oz 185 lb 3 oz 180 lb 8.9 oz  Weight (kg) 84.3 kg 84 kg 81.9 kg      Telemetry  Overnight telemetry shows several episodes of bradycardia, which I personally reviewed.    Physical Exam   General: Well nourished, well developed, in no acute distress Head: Atraumatic, normal size  Eyes: PEERLA, EOMI  Neck: Supple, no JVD Endocrine: No  thryomegaly Cardiac: Normal S1, S2; RRR; no murmurs, rubs, or gallops Lungs: Clear to auscultation bilaterally, no wheezing, rhonchi or rales  Abd: Soft, nontender, no hepatomegaly  Ext: No edema, pulses 2+ Musculoskeletal: No deformities, BUE and BLE strength normal and equal; radial cath site clean dry intact. Skin: Warm and dry, no rashes   Neuro: Alert and oriented to person, place, time, and situation, CNII-XII grossly intact, no focal deficits  Psych: Normal mood and affect   Labs    High Sensitivity Troponin:   Recent Labs  Lab 05/02/22 1514 05/02/22 1714 05/02/22 2117 05/03/22 0147  TROPONINIHS 214* 5,099* 8,341* 8,349*     Chemistry Recent Labs  Lab 05/02/22 1514 05/04/22 0014 05/05/22 0325 05/06/22 0058 05/07/22 0022  NA 138   < > 136 140 136  K 4.3   < > 3.8 3.9 4.0  CL 105   < > 103 99 101  CO2 19*   < > 24 19* 24  GLUCOSE 129*   < > 120* 99 110*  BUN 17   < > '18 19 18  '$ CREATININE 1.20   < > 1.47* 1.21 1.45*  CALCIUM 9.1   < > 9.4 8.3* 9.3  PROT 6.3*  --   --   --   --   ALBUMIN 3.6  --   --   --   --   AST 39  --   --   --   --   ALT 43  --   --   --   --   ALKPHOS 40  --   --   --   --   BILITOT 0.8  --   --   --   --   GFRNONAA >60   < > 49* >60 50*  ANIONGAP 14   < > 9 22* 11   < > = values in this interval not displayed.    Lipids No results for input(s): "CHOL", "TRIG", "HDL", "LABVLDL", "LDLCALC", "CHOLHDL" in the last 168 hours.  Hematology Recent Labs  Lab 05/05/22 0325 05/06/22 0058 05/07/22 0022  WBC 5.8 5.7 5.3  RBC 4.23 4.16* 3.87*  HGB 13.3 13.3 12.5*  HCT 38.8* 38.4* 35.4*  MCV 91.7 92.3 91.5  MCH 31.4 32.0 32.3  MCHC 34.3 34.6 35.3  RDW 15.2 14.9 15.0  PLT 209 172 184   Thyroid No results for input(s): "TSH", "FREET4" in the last 168 hours.  BNPNo results for input(s): "BNP", "PROBNP" in the last 168 hours.  DDimer No results for input(s): "DDIMER" in the last 168 hours.  Radiology    No new studies  Cardiac Studies  - Summarized   TTE 05/03/2022: EF 50%.  Mildly aneurysmal and akinetic apex.  No thrombus.  Otherwise no significant WMA.  GR 1 DD.  Unable to assess RV P but RV appears normal.  Mild to moderate mitral stenosis.  Mildly elevated RAP of 8 mmHg.     LHC 05/02/2022 Severe Multi-Vessel CAD:  Prox LAD 75%@ SP1 (just prior to extensive stented segment) Prox-mid LCx ~65% (prox stent edge lesion) with progression of jailed OM2 lesion to 90% (likely Culprit) Patent Prox to mid RCA stents with sequential focal lesions 80%-60-70% lesions & 80%   Known to be moderate Ischemic CM - EF~ 40% (unable to assess regional wall motion abnormalities due to poor filling. Recommend 2D echo to better assess)      2= hypokinesis    Patient Profile  Braxten Forbush is a 77 y.o. 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  Taken to Cath Lab for ongoing chest pain, found to have three-vessel CAD.  Recommended CABG evaluation. Evaluated by CT surgery.  Plan for CABG on 05/08/2022 with Dr. Lavonna Monarch; case scheduled for 07:15AM -Currently on Plavix wash out since 3/8 -> post cath treated with 6 hours of Aggrastat -Continue aspirin and heparin drip, which is at therapeutic levels -Statin intolerant.  Now on Zetia.  Consider outpatient PCSK9 inhibitor evaluation. -Continue on IMDUR -On stable dose of beta-blocker with as needed hydralazine in lieu of holding Entresto pre-CABG. -Stable for transfer to floor.  # Acute HFpEF -  EF 50% -LVEDP was elevated at the time of admission.  -Euvolemic on examination. -Continue carvedilol 3.125 mg twice daily -Stop Entresto 48 HR prior to  CABG -- Hydralazine added PRN -Lasix PRN -Consider Jardiance and Aldactone as renal function and BP allows   #AKI -CTM  #Diabetes A1c 7.0 CTM BG post operatively  # Hyperlipidemia -Statin intolerant.  Continue on  Zetia 10 mg daily.   -Consider outpatient PCSK9 inhibitor therapy.   #FEN -No intravenous fluids -Patient cleared for gentle walks with assistance/ monitoring for recurrence of symptoms - Diet: Heart healthy - DVT PPx: Heparin drip - Code: Full - Disposition: Transfer orders to the floor placed, awaiting bed.   Discussed current plan with attending, Dr. Ellyn Hack, during rounds.    Romana Juniper, MD [facility] Internal Medicine Program 05/08/2022, 11:11 AM  ATTENDING ATTESTATION  I have seen, examined and evaluated the patient this morning on rounds along with Dr. Simeon Craft (resident physician).  After reviewing all the available data and chart, we discussed the patients laboratory, study & physical findings as well as symptoms in detail.  I agree with her findings, examination as well as impression recommendations as per our discussion.    Attending adjustments noted in italics.   Stable night.  No major issues.  Remains on IV heparin with no further chest pain.  Has been able to walk around the room and a little bit around the unit without significant pain.  No changes to medications.  Plan for CABG tomorrow with Dr. Lavonna Monarch.  Per protocol, Entresto being held 48 hours preop.  Leonie Man, MD, MS Glenetta Hew, M.D., M.S. Interventional Cardiologist  [place]  Pager # [phone] Phone # [phone] [address] [place], [place] [address]     For questions or updates, please contact [place] Please consult [email or web address] for contact info under

## 2022-05-07 NOTE — Progress Notes (Signed)
[address]       [place] [address]             [phone]      5 Days Post-Op  Procedure(s) (LRB): LEFT HEART CATH AND CORONARY ANGIOGRAPHY (N/A)   Total Length of Stay:  LOS: 5 days   SUBJECTIVE: I have been asked to assume surgical care of this 77 yo male with history of multiple PCI in past, ischemic cardiomyopathy, admitted with NSTEMI and found to have multivessel CAD and echo with EF 50% and moderate AI. Pt with no significant carotid stenosis on duplex. Pt comfortable and pain free on interview Vitals:   05/07/22 0637 05/07/22 0700  BP: (!) 130/96   Pulse: 70 (!) 46  Resp: (!) 25 19  Temp:    SpO2: 94% 93%    Intake/Output      03/12 0701 03/13 0700 03/13 0701 03/14 0700   P.O. 250    I.V. (mL/kg) 560.9 (6.7)    Total Intake(mL/kg) 810.9 (9.6)    Urine (mL/kg/hr) 1775 (0.9)    Stool 0    Total Output 1775    Net -964.1         Urine Occurrence 1 x    Stool Occurrence 1 x        sodium chloride     sodium chloride 10 mL/hr at 05/07/22 0700   sodium chloride     heparin 1,250 Units/hr (05/07/22 0700)    CBC    Component Value Date/Time   WBC 5.3 05/07/2022 0022   RBC 3.87 (L) 05/07/2022 0022   HGB 12.5 (L) 05/07/2022 0022   HGB 15.3 06/16/2019 1102   HCT 35.4 (L) 05/07/2022 0022   HCT 45.1 06/16/2019 1102   PLT 184 05/07/2022 0022   PLT 238 06/16/2019 1102   MCV 91.5 05/07/2022 0022   MCV 94 06/16/2019 1102   MCH 32.3 05/07/2022 0022   MCHC 35.3 05/07/2022 0022   RDW 15.0 05/07/2022 0022   RDW 13.8 06/16/2019 1102   LYMPHSABS 1.6 05/02/2022 1514   MONOABS 0.4 05/02/2022 1514   EOSABS 0.2 05/02/2022 1514   BASOSABS 0.0 05/02/2022 1514   CMP     Component Value Date/Time   NA 136 05/07/2022 0022   NA 139 06/16/2019 1102   K 4.0 05/07/2022 0022   CL 101 05/07/2022 0022   CO2 24 05/07/2022 0022   GLUCOSE 110 (H) 05/07/2022 0022   BUN 18 05/07/2022 0022   BUN 17 06/16/2019 1102   CREATININE 1.45 (H)  05/07/2022 0022   CALCIUM 9.3 05/07/2022 0022   PROT 6.3 (L) 05/02/2022 1514   PROT 7.3 06/24/2019 1333   ALBUMIN 3.6 05/02/2022 1514   ALBUMIN 4.7 06/24/2019 1333   AST 39 05/02/2022 1514   ALT 43 05/02/2022 1514   ALKPHOS 40 05/02/2022 1514   BILITOT 0.8 05/02/2022 1514   BILITOT 0.5 06/24/2019 1333   GFRNONAA 50 (L) 05/07/2022 0022   GFRAA >60 08/11/2019 0445   ABG    Component Value Date/Time   TCO2 24 06/02/2007 2108   CBG (last 3)  No results for input(s): "GLUCAP" in the last 72 hours.   ASSESSMENT: 77 yo male with NSTEMI and now with multivessel CAD, slightly depressed LV function and moderate AI. Pt would best be served with CABG and all the risks and goals of surgery were discussed and pt wishes to proceed.    Coralie Common, MD 05/07/2022

## 2022-05-07 NOTE — H&P (View-Only) (Signed)
[address]       [place] [address]             [phone]      5 Days Post-Op  Procedure(s) (LRB): LEFT HEART CATH AND CORONARY ANGIOGRAPHY (N/A)   Total Length of Stay:  LOS: 5 days   SUBJECTIVE: I have been asked to assume surgical care of this 77 yo male with history of multiple PCI in past, ischemic cardiomyopathy, admitted with NSTEMI and found to have multivessel CAD and echo with EF 50% and moderate AI. Pt with no significant carotid stenosis on duplex. Pt comfortable and pain free on interview Vitals:   05/07/22 0637 05/07/22 0700  BP: (!) 130/96   Pulse: 70 (!) 46  Resp: (!) 25 19  Temp:    SpO2: 94% 93%    Intake/Output      03/12 0701 03/13 0700 03/13 0701 03/14 0700   P.O. 250    I.V. (mL/kg) 560.9 (6.7)    Total Intake(mL/kg) 810.9 (9.6)    Urine (mL/kg/hr) 1775 (0.9)    Stool 0    Total Output 1775    Net -964.1         Urine Occurrence 1 x    Stool Occurrence 1 x        sodium chloride     sodium chloride 10 mL/hr at 05/07/22 0700   sodium chloride     heparin 1,250 Units/hr (05/07/22 0700)    CBC    Component Value Date/Time   WBC 5.3 05/07/2022 0022   RBC 3.87 (L) 05/07/2022 0022   HGB 12.5 (L) 05/07/2022 0022   HGB 15.3 06/16/2019 1102   HCT 35.4 (L) 05/07/2022 0022   HCT 45.1 06/16/2019 1102   PLT 184 05/07/2022 0022   PLT 238 06/16/2019 1102   MCV 91.5 05/07/2022 0022   MCV 94 06/16/2019 1102   MCH 32.3 05/07/2022 0022   MCHC 35.3 05/07/2022 0022   RDW 15.0 05/07/2022 0022   RDW 13.8 06/16/2019 1102   LYMPHSABS 1.6 05/02/2022 1514   MONOABS 0.4 05/02/2022 1514   EOSABS 0.2 05/02/2022 1514   BASOSABS 0.0 05/02/2022 1514   CMP     Component Value Date/Time   NA 136 05/07/2022 0022   NA 139 06/16/2019 1102   K 4.0 05/07/2022 0022   CL 101 05/07/2022 0022   CO2 24 05/07/2022 0022   GLUCOSE 110 (H) 05/07/2022 0022   BUN 18 05/07/2022 0022   BUN 17 06/16/2019 1102   CREATININE 1.45 (H)  05/07/2022 0022   CALCIUM 9.3 05/07/2022 0022   PROT 6.3 (L) 05/02/2022 1514   PROT 7.3 06/24/2019 1333   ALBUMIN 3.6 05/02/2022 1514   ALBUMIN 4.7 06/24/2019 1333   AST 39 05/02/2022 1514   ALT 43 05/02/2022 1514   ALKPHOS 40 05/02/2022 1514   BILITOT 0.8 05/02/2022 1514   BILITOT 0.5 06/24/2019 1333   GFRNONAA 50 (L) 05/07/2022 0022   GFRAA >60 08/11/2019 0445   ABG    Component Value Date/Time   TCO2 24 06/02/2007 2108   CBG (last 3)  No results for input(s): "GLUCAP" in the last 72 hours.   ASSESSMENT: 77 yo male with NSTEMI and now with multivessel CAD, slightly depressed LV function and moderate AI. Pt would best be served with CABG and all the risks and goals of surgery were discussed and pt wishes to proceed.    Coralie Common, MD 05/07/2022

## 2022-05-08 ENCOUNTER — Other Ambulatory Visit: Payer: Self-pay

## 2022-05-08 ENCOUNTER — Inpatient Hospital Stay (HOSPITAL_COMMUNITY): Payer: 59

## 2022-05-08 ENCOUNTER — Inpatient Hospital Stay (HOSPITAL_COMMUNITY): Payer: 59 | Admitting: Anesthesiology

## 2022-05-08 ENCOUNTER — Encounter (HOSPITAL_COMMUNITY)
Admission: EM | Disposition: A | Discharge status: Home / Self Care | Payer: Self-pay | Source: Home / Self Care | Attending: Thoracic Surgery (Cardiothoracic Vascular Surgery)

## 2022-05-08 DIAGNOSIS — I2510 Atherosclerotic heart disease of native coronary artery without angina pectoris: Secondary | ICD-10-CM | POA: Diagnosis not present

## 2022-05-08 DIAGNOSIS — I10 Essential (primary) hypertension: Secondary | ICD-10-CM

## 2022-05-08 DIAGNOSIS — Z955 Presence of coronary angioplasty implant and graft: Secondary | ICD-10-CM

## 2022-05-08 DIAGNOSIS — E119 Type 2 diabetes mellitus without complications: Secondary | ICD-10-CM

## 2022-05-08 DIAGNOSIS — I255 Ischemic cardiomyopathy: Secondary | ICD-10-CM | POA: Diagnosis not present

## 2022-05-08 DIAGNOSIS — I25110 Atherosclerotic heart disease of native coronary artery with unstable angina pectoris: Secondary | ICD-10-CM | POA: Insufficient documentation

## 2022-05-08 DIAGNOSIS — Z951 Presence of aortocoronary bypass graft: Secondary | ICD-10-CM

## 2022-05-08 DIAGNOSIS — I252 Old myocardial infarction: Secondary | ICD-10-CM

## 2022-05-08 DIAGNOSIS — Z87891 Personal history of nicotine dependence: Secondary | ICD-10-CM

## 2022-05-08 DIAGNOSIS — I214 Non-ST elevation (NSTEMI) myocardial infarction: Secondary | ICD-10-CM | POA: Diagnosis not present

## 2022-05-08 HISTORY — PX: CORONARY ARTERY BYPASS GRAFT: SHX141

## 2022-05-08 HISTORY — PX: TEE WITHOUT CARDIOVERSION: SHX5443

## 2022-05-08 LAB — POCT I-STAT, CHEM 8
BUN: 17 mg/dL (ref 8–23)
BUN: 17 mg/dL (ref 8–23)
BUN: 15 mg/dL (ref 8–23)
BUN: 15 mg/dL (ref 8–23)
BUN: 15 mg/dL (ref 8–23)
Calcium, Ion: 1.25 mmol/L (ref 1.15–1.40)
Calcium, Ion: 1.28 mmol/L (ref 1.15–1.40)
Calcium, Ion: 0.99 mmol/L — ABNORMAL LOW (ref 1.15–1.40)
Calcium, Ion: 1 mmol/L — ABNORMAL LOW (ref 1.15–1.40)
Calcium, Ion: 1.02 mmol/L — ABNORMAL LOW (ref 1.15–1.40)
Chloride: 104 mmol/L (ref 98–111)
Chloride: 103 mmol/L (ref 98–111)
Chloride: 104 mmol/L (ref 98–111)
Chloride: 103 mmol/L (ref 98–111)
Chloride: 103 mmol/L (ref 98–111)
Creatinine, Ser: 1.2 mg/dL (ref 0.61–1.24)
Creatinine, Ser: 1.1 mg/dL (ref 0.61–1.24)
Creatinine, Ser: 0.9 mg/dL (ref 0.61–1.24)
Creatinine, Ser: 1 mg/dL (ref 0.61–1.24)
Creatinine, Ser: 1 mg/dL (ref 0.61–1.24)
Glucose, Bld: 131 mg/dL — ABNORMAL HIGH (ref 70–99)
Glucose, Bld: 115 mg/dL — ABNORMAL HIGH (ref 70–99)
Glucose, Bld: 126 mg/dL — ABNORMAL HIGH (ref 70–99)
Glucose, Bld: 158 mg/dL — ABNORMAL HIGH (ref 70–99)
Glucose, Bld: 160 mg/dL — ABNORMAL HIGH (ref 70–99)
HCT: 34 % — ABNORMAL LOW (ref 39.0–52.0)
HCT: 34 % — ABNORMAL LOW (ref 39.0–52.0)
HCT: 26 % — ABNORMAL LOW (ref 39.0–52.0)
HCT: 26 % — ABNORMAL LOW (ref 39.0–52.0)
HCT: 27 % — ABNORMAL LOW (ref 39.0–52.0)
Hemoglobin: 11.6 g/dL — ABNORMAL LOW (ref 13.0–17.0)
Hemoglobin: 11.6 g/dL — ABNORMAL LOW (ref 13.0–17.0)
Hemoglobin: 8.8 g/dL — ABNORMAL LOW (ref 13.0–17.0)
Hemoglobin: 8.8 g/dL — ABNORMAL LOW (ref 13.0–17.0)
Hemoglobin: 9.2 g/dL — ABNORMAL LOW (ref 13.0–17.0)
Potassium: 4.1 mmol/L (ref 3.5–5.1)
Potassium: 3.9 mmol/L (ref 3.5–5.1)
Potassium: 5.9 mmol/L — ABNORMAL HIGH (ref 3.5–5.1)
Potassium: 5.4 mmol/L — ABNORMAL HIGH (ref 3.5–5.1)
Potassium: 5.6 mmol/L — ABNORMAL HIGH (ref 3.5–5.1)
Sodium: 139 mmol/L (ref 135–145)
Sodium: 138 mmol/L (ref 135–145)
Sodium: 138 mmol/L (ref 135–145)
Sodium: 137 mmol/L (ref 135–145)
Sodium: 137 mmol/L (ref 135–145)
TCO2: 23 mmol/L (ref 22–32)
TCO2: 23 mmol/L (ref 22–32)
TCO2: 27 mmol/L (ref 22–32)
TCO2: 24 mmol/L (ref 22–32)
TCO2: 26 mmol/L (ref 22–32)

## 2022-05-08 LAB — POCT I-STAT 7, (LYTES, BLD GAS, ICA,H+H)
Acid-base deficit: 3 mmol/L — ABNORMAL HIGH (ref 0.0–2.0)
Acid-base deficit: 2 mmol/L (ref 0.0–2.0)
Acid-base deficit: 3 mmol/L — ABNORMAL HIGH (ref 0.0–2.0)
Acid-base deficit: 5 mmol/L — ABNORMAL HIGH (ref 0.0–2.0)
Acid-base deficit: 5 mmol/L — ABNORMAL HIGH (ref 0.0–2.0)
Acid-base deficit: 4 mmol/L — ABNORMAL HIGH (ref 0.0–2.0)
Bicarbonate: 22.1 mmol/L (ref 20.0–28.0)
Bicarbonate: 22.7 mmol/L (ref 20.0–28.0)
Bicarbonate: 21.7 mmol/L (ref 20.0–28.0)
Bicarbonate: 20.6 mmol/L (ref 20.0–28.0)
Bicarbonate: 19.9 mmol/L — ABNORMAL LOW (ref 20.0–28.0)
Bicarbonate: 21 mmol/L (ref 20.0–28.0)
Calcium, Ion: 1.27 mmol/L (ref 1.15–1.40)
Calcium, Ion: 1.03 mmol/L — ABNORMAL LOW (ref 1.15–1.40)
Calcium, Ion: 1.03 mmol/L — ABNORMAL LOW (ref 1.15–1.40)
Calcium, Ion: 1.09 mmol/L — ABNORMAL LOW (ref 1.15–1.40)
Calcium, Ion: 1.14 mmol/L — ABNORMAL LOW (ref 1.15–1.40)
Calcium, Ion: 1.14 mmol/L — ABNORMAL LOW (ref 1.15–1.40)
HCT: 35 % — ABNORMAL LOW (ref 39.0–52.0)
HCT: 27 % — ABNORMAL LOW (ref 39.0–52.0)
HCT: 26 % — ABNORMAL LOW (ref 39.0–52.0)
HCT: 31 % — ABNORMAL LOW (ref 39.0–52.0)
HCT: 28 % — ABNORMAL LOW (ref 39.0–52.0)
HCT: 27 % — ABNORMAL LOW (ref 39.0–52.0)
Hemoglobin: 11.9 g/dL — ABNORMAL LOW (ref 13.0–17.0)
Hemoglobin: 9.2 g/dL — ABNORMAL LOW (ref 13.0–17.0)
Hemoglobin: 8.8 g/dL — ABNORMAL LOW (ref 13.0–17.0)
Hemoglobin: 10.5 g/dL — ABNORMAL LOW (ref 13.0–17.0)
Hemoglobin: 9.5 g/dL — ABNORMAL LOW (ref 13.0–17.0)
Hemoglobin: 9.2 g/dL — ABNORMAL LOW (ref 13.0–17.0)
O2 Saturation: 100 %
O2 Saturation: 100 %
O2 Saturation: 100 %
O2 Saturation: 99 %
O2 Saturation: 97 %
O2 Saturation: 99 %
Patient temperature: 35.6
Patient temperature: 37.2
Patient temperature: 37.3
Potassium: 4.1 mmol/L (ref 3.5–5.1)
Potassium: 5.4 mmol/L — ABNORMAL HIGH (ref 3.5–5.1)
Potassium: 5.4 mmol/L — ABNORMAL HIGH (ref 3.5–5.1)
Potassium: 4.6 mmol/L (ref 3.5–5.1)
Potassium: 4.5 mmol/L (ref 3.5–5.1)
Potassium: 4.6 mmol/L (ref 3.5–5.1)
Sodium: 138 mmol/L (ref 135–145)
Sodium: 137 mmol/L (ref 135–145)
Sodium: 137 mmol/L (ref 135–145)
Sodium: 140 mmol/L (ref 135–145)
Sodium: 140 mmol/L (ref 135–145)
Sodium: 138 mmol/L (ref 135–145)
TCO2: 23 mmol/L (ref 22–32)
TCO2: 24 mmol/L (ref 22–32)
TCO2: 23 mmol/L (ref 22–32)
TCO2: 22 mmol/L (ref 22–32)
TCO2: 21 mmol/L — ABNORMAL LOW (ref 22–32)
TCO2: 22 mmol/L (ref 22–32)
pCO2 arterial: 40.9 mmHg (ref 32–48)
pCO2 arterial: 37.8 mmHg (ref 32–48)
pCO2 arterial: 37.7 mmHg (ref 32–48)
pCO2 arterial: 35.2 mmHg (ref 32–48)
pCO2 arterial: 37.4 mmHg (ref 32–48)
pCO2 arterial: 37.2 mmHg (ref 32–48)
pH, Arterial: 7.341 — ABNORMAL LOW (ref 7.35–7.45)
pH, Arterial: 7.387 (ref 7.35–7.45)
pH, Arterial: 7.368 (ref 7.35–7.45)
pH, Arterial: 7.368 (ref 7.35–7.45)
pH, Arterial: 7.336 — ABNORMAL LOW (ref 7.35–7.45)
pH, Arterial: 7.36 (ref 7.35–7.45)
pO2, Arterial: 280 mmHg — ABNORMAL HIGH (ref 83–108)
pO2, Arterial: 401 mmHg — ABNORMAL HIGH (ref 83–108)
pO2, Arterial: 287 mmHg — ABNORMAL HIGH (ref 83–108)
pO2, Arterial: 132 mmHg — ABNORMAL HIGH (ref 83–108)
pO2, Arterial: 97 mmHg (ref 83–108)
pO2, Arterial: 148 mmHg — ABNORMAL HIGH (ref 83–108)

## 2022-05-08 LAB — GLUCOSE, CAPILLARY
Glucose-Capillary: 106 mg/dL — ABNORMAL HIGH (ref 70–99)
Glucose-Capillary: 120 mg/dL — ABNORMAL HIGH (ref 70–99)
Glucose-Capillary: 113 mg/dL — ABNORMAL HIGH (ref 70–99)
Glucose-Capillary: 106 mg/dL — ABNORMAL HIGH (ref 70–99)
Glucose-Capillary: 116 mg/dL — ABNORMAL HIGH (ref 70–99)
Glucose-Capillary: 123 mg/dL — ABNORMAL HIGH (ref 70–99)
Glucose-Capillary: 127 mg/dL — ABNORMAL HIGH (ref 70–99)
Glucose-Capillary: 121 mg/dL — ABNORMAL HIGH (ref 70–99)
Glucose-Capillary: 117 mg/dL — ABNORMAL HIGH (ref 70–99)
Glucose-Capillary: 122 mg/dL — ABNORMAL HIGH (ref 70–99)

## 2022-05-08 LAB — BASIC METABOLIC PANEL
Anion gap: 11 (ref 5–15)
Anion gap: 9 (ref 5–15)
BUN: 18 mg/dL (ref 8–23)
BUN: 15 mg/dL (ref 8–23)
CO2: 21 mmol/L — ABNORMAL LOW (ref 22–32)
CO2: 19 mmol/L — ABNORMAL LOW (ref 22–32)
Calcium: 9.2 mg/dL (ref 8.9–10.3)
Calcium: 7.8 mg/dL — ABNORMAL LOW (ref 8.9–10.3)
Chloride: 104 mmol/L (ref 98–111)
Chloride: 107 mmol/L (ref 98–111)
Creatinine, Ser: 1.39 mg/dL — ABNORMAL HIGH (ref 0.61–1.24)
Creatinine, Ser: 1.1 mg/dL (ref 0.61–1.24)
GFR, Estimated: 53 mL/min — ABNORMAL LOW (ref >60)
GFR, Estimated: >60 mL/min (ref >60)
Glucose, Bld: 106 mg/dL — ABNORMAL HIGH (ref 70–99)
Glucose, Bld: 129 mg/dL — ABNORMAL HIGH (ref 70–99)
Potassium: 3.8 mmol/L (ref 3.5–5.1)
Potassium: 4.6 mmol/L (ref 3.5–5.1)
Sodium: 136 mmol/L (ref 135–145)
Sodium: 135 mmol/L (ref 135–145)

## 2022-05-08 LAB — CBC
HCT: 37.7 % — ABNORMAL LOW (ref 39.0–52.0)
HCT: 31 % — ABNORMAL LOW (ref 39.0–52.0)
HCT: 30.1 % — ABNORMAL LOW (ref 39.0–52.0)
Hemoglobin: 12.7 g/dL — ABNORMAL LOW (ref 13.0–17.0)
Hemoglobin: 10.4 g/dL — ABNORMAL LOW (ref 13.0–17.0)
Hemoglobin: 9.8 g/dL — ABNORMAL LOW (ref 13.0–17.0)
MCH: 31.4 pg (ref 26.0–34.0)
MCH: 31.6 pg (ref 26.0–34.0)
MCH: 30.8 pg (ref 26.0–34.0)
MCHC: 33.7 g/dL (ref 30.0–36.0)
MCHC: 33.5 g/dL (ref 30.0–36.0)
MCHC: 32.6 g/dL (ref 30.0–36.0)
MCV: 93.1 fL (ref 80.0–100.0)
MCV: 94.2 fL (ref 80.0–100.0)
MCV: 94.7 fL (ref 80.0–100.0)
Platelets: 189 10*3/uL (ref 150–400)
Platelets: 124 10*3/uL — ABNORMAL LOW (ref 150–400)
Platelets: 130 10*3/uL — ABNORMAL LOW (ref 150–400)
RBC: 4.05 MIL/uL — ABNORMAL LOW (ref 4.22–5.81)
RBC: 3.29 MIL/uL — ABNORMAL LOW (ref 4.22–5.81)
RBC: 3.18 MIL/uL — ABNORMAL LOW (ref 4.22–5.81)
RDW: 14.8 % (ref 11.5–15.5)
RDW: 14.8 % (ref 11.5–15.5)
RDW: 15 % (ref 11.5–15.5)
WBC: 4.9 10*3/uL (ref 4.0–10.5)
WBC: 9 10*3/uL (ref 4.0–10.5)
WBC: 8.1 10*3/uL (ref 4.0–10.5)
nRBC: 0 % (ref 0.0–0.2)
nRBC: 0 % (ref 0.0–0.2)
nRBC: 0 % (ref 0.0–0.2)

## 2022-05-08 LAB — HEMOGLOBIN AND HEMATOCRIT, BLOOD
HCT: 27.6 % — ABNORMAL LOW (ref 39.0–52.0)
Hemoglobin: 9.6 g/dL — ABNORMAL LOW (ref 13.0–17.0)

## 2022-05-08 LAB — ECHO INTRAOPERATIVE TEE
Height: 67 in
MV Vena cont: 1 cm
P 1/2 time: 799 msec
S' Lateral: 3.5 cm
Weight: 2973.56 oz

## 2022-05-08 LAB — POCT I-STAT EG7
Acid-base deficit: 4 mmol/L — ABNORMAL HIGH (ref 0.0–2.0)
Bicarbonate: 21.7 mmol/L (ref 20.0–28.0)
Calcium, Ion: 1.09 mmol/L — ABNORMAL LOW (ref 1.15–1.40)
HCT: 29 % — ABNORMAL LOW (ref 39.0–52.0)
Hemoglobin: 9.9 g/dL — ABNORMAL LOW (ref 13.0–17.0)
O2 Saturation: 88 %
Potassium: 5.5 mmol/L — ABNORMAL HIGH (ref 3.5–5.1)
Sodium: 138 mmol/L (ref 135–145)
TCO2: 23 mmol/L (ref 22–32)
pCO2, Ven: 39.2 mmHg — ABNORMAL LOW (ref 44–60)
pH, Ven: 7.352 (ref 7.25–7.43)
pO2, Ven: 57 mmHg — ABNORMAL HIGH (ref 32–45)

## 2022-05-08 LAB — PLATELET COUNT: Platelets: 166 K/uL (ref 150–400)

## 2022-05-08 LAB — MAGNESIUM: Magnesium: 3.5 mg/dL — ABNORMAL HIGH (ref 1.7–2.4)

## 2022-05-08 LAB — APTT: aPTT: 29 seconds (ref 24–36)

## 2022-05-08 LAB — HEPARIN LEVEL (UNFRACTIONATED): Heparin Unfractionated: 0.32 IU/mL (ref 0.30–0.70)

## 2022-05-08 LAB — PROTIME-INR
INR: 1.6 — ABNORMAL HIGH (ref 0.8–1.2)
Prothrombin Time: 19.2 seconds — ABNORMAL HIGH (ref 11.4–15.2)

## 2022-05-08 SURGERY — CORONARY ARTERY BYPASS GRAFTING (CABG)
Anesthesia: General | Site: Chest

## 2022-05-08 MED ORDER — FENTANYL CITRATE (PF) 250 MCG/5ML IJ SOLN
INTRAMUSCULAR | Status: DC | PRN
  Administered 2022-05-08: 100 ug via INTRAVENOUS
  Administered 2022-05-08: 50 ug via INTRAVENOUS
  Administered 2022-05-08: 250 ug via INTRAVENOUS
  Administered 2022-05-08: 100 ug via INTRAVENOUS
  Administered 2022-05-08: 50 ug via INTRAVENOUS
  Administered 2022-05-08: 150 ug via INTRAVENOUS
  Administered 2022-05-08 (×2): 250 ug via INTRAVENOUS
  Administered 2022-05-08: 50 ug via INTRAVENOUS

## 2022-05-08 MED ORDER — HEPARIN SODIUM (PORCINE) 1000 UNIT/ML IJ SOLN
INTRAMUSCULAR | Status: AC
  Filled 2022-05-08: qty 1

## 2022-05-08 MED ORDER — ARTIFICIAL TEARS OPHTHALMIC OINT
TOPICAL_OINTMENT | OPHTHALMIC | Status: DC | PRN
  Administered 2022-05-08: 1 via OPHTHALMIC

## 2022-05-08 MED ORDER — SODIUM CHLORIDE 0.9 % IV SOLN: INTRAVENOUS | Status: DC | PRN

## 2022-05-08 MED ORDER — ACETAMINOPHEN 160 MG/5ML PO SOLN: 1000.0000 mg | Freq: Four times a day (QID) | ORAL | Status: DC

## 2022-05-08 MED ORDER — ROCURONIUM BROMIDE 10 MG/ML (PF) SYRINGE
PREFILLED_SYRINGE | INTRAVENOUS | Status: AC
  Filled 2022-05-08: qty 30

## 2022-05-08 MED ORDER — ROCURONIUM BROMIDE 10 MG/ML (PF) SYRINGE
PREFILLED_SYRINGE | INTRAVENOUS | Status: DC | PRN
  Administered 2022-05-08: 50 mg via INTRAVENOUS
  Administered 2022-05-08: 100 mg via INTRAVENOUS
  Administered 2022-05-08: 50 mg via INTRAVENOUS

## 2022-05-08 MED ORDER — INSULIN REGULAR(HUMAN) IN NACL 100-0.9 UT/100ML-% IV SOLN: INTRAVENOUS | Status: DC

## 2022-05-08 MED ORDER — LACTATED RINGERS IV SOLN: INTRAVENOUS | Status: DC | PRN

## 2022-05-08 MED ORDER — PROTAMINE SULFATE 10 MG/ML IV SOLN
INTRAVENOUS | Status: DC | PRN
  Administered 2022-05-08: 280 mg via INTRAVENOUS
  Administered 2022-05-08: 20 mg via INTRAVENOUS

## 2022-05-08 MED ORDER — METOPROLOL TARTRATE 12.5 MG HALF TABLET
12.5000 mg | ORAL_TABLET | Freq: Two times a day (BID) | ORAL | Status: DC
  Administered 2022-05-09 (×2): 12.5 mg via ORAL
  Filled 2022-05-08 (×2): qty 1

## 2022-05-08 MED ORDER — FENTANYL CITRATE (PF) 250 MCG/5ML IJ SOLN
INTRAMUSCULAR | Status: AC
  Filled 2022-05-08: qty 5

## 2022-05-08 MED ORDER — MAGNESIUM SULFATE 4 GM/100ML IV SOLN
4.0000 g | Freq: Once | INTRAVENOUS | Status: AC
  Administered 2022-05-08: 4 g via INTRAVENOUS
  Filled 2022-05-08: qty 100

## 2022-05-08 MED ORDER — FAMOTIDINE IN NACL 20-0.9 MG/50ML-% IV SOLN
20.0000 mg | Freq: Two times a day (BID) | INTRAVENOUS | Status: AC
  Administered 2022-05-08 (×2): 20 mg via INTRAVENOUS
  Filled 2022-05-08 (×2): qty 50

## 2022-05-08 MED ORDER — CHLORHEXIDINE GLUCONATE CLOTH 2 % EX PADS
6.0000 | MEDICATED_PAD | Freq: Every day | CUTANEOUS | Status: DC
  Administered 2022-05-09 – 2022-05-11 (×2): 6 via TOPICAL

## 2022-05-08 MED ORDER — ACETAMINOPHEN 650 MG RE SUPP
650.0000 mg | Freq: Once | RECTAL | Status: DC
  Administered 2022-05-08: 650 mg via RECTAL

## 2022-05-08 MED ORDER — PROTAMINE SULFATE 10 MG/ML IV SOLN
INTRAVENOUS | Status: AC
  Filled 2022-05-08: qty 25

## 2022-05-08 MED ORDER — MORPHINE SULFATE (PF) 2 MG/ML IV SOLN
1.0000 mg | INTRAVENOUS | Status: DC | PRN
  Administered 2022-05-08: 2 mg via INTRAVENOUS
  Administered 2022-05-08 (×2): 4 mg via INTRAVENOUS
  Administered 2022-05-08: 3 mg via INTRAVENOUS
  Administered 2022-05-09: 2 mg via INTRAVENOUS
  Administered 2022-05-09: 4 mg via INTRAVENOUS
  Filled 2022-05-08: qty 1
  Filled 2022-05-08 (×3): qty 2
  Filled 2022-05-08: qty 1
  Filled 2022-05-08: qty 2

## 2022-05-08 MED ORDER — DEXMEDETOMIDINE HCL IN NACL 400 MCG/100ML IV SOLN: 0.0000 ug/kg/h | INTRAVENOUS | Status: DC

## 2022-05-08 MED ORDER — NITROGLYCERIN IN D5W 200-5 MCG/ML-% IV SOLN: 0.0000 ug/min | INTRAVENOUS | Status: DC

## 2022-05-08 MED ORDER — PAPAVERINE HCL 30 MG/ML IJ SOLN
INTRAMUSCULAR | Status: DC | PRN
  Administered 2022-05-08: 60 mg

## 2022-05-08 MED ORDER — METOPROLOL TARTRATE 5 MG/5ML IV SOLN
2.5000 mg | INTRAVENOUS | Status: DC | PRN
  Administered 2022-05-09: 2.5 mg via INTRAVENOUS
  Filled 2022-05-08: qty 5

## 2022-05-08 MED ORDER — TRAMADOL HCL 50 MG PO TABS
50.0000 mg | ORAL_TABLET | ORAL | Status: DC | PRN
  Administered 2022-05-08: 50 mg via ORAL
  Administered 2022-05-09 – 2022-05-11 (×5): 100 mg via ORAL
  Filled 2022-05-08 (×4): qty 2
  Filled 2022-05-08: qty 1
  Filled 2022-05-08: qty 2

## 2022-05-08 MED ORDER — MIDAZOLAM HCL (PF) 5 MG/ML IJ SOLN
INTRAMUSCULAR | Status: DC | PRN
  Administered 2022-05-08 (×2): 2 mg via INTRAVENOUS
  Administered 2022-05-08: 3 mg via INTRAVENOUS
  Administered 2022-05-08: 2 mg via INTRAVENOUS
  Administered 2022-05-08: 3 mg via INTRAVENOUS

## 2022-05-08 MED ORDER — SUCCINYLCHOLINE CHLORIDE 200 MG/10ML IV SOSY
PREFILLED_SYRINGE | INTRAVENOUS | Status: AC
  Filled 2022-05-08: qty 10

## 2022-05-08 MED ORDER — PROPOFOL 10 MG/ML IV BOLUS
INTRAVENOUS | Status: AC
  Filled 2022-05-08: qty 20

## 2022-05-08 MED ORDER — SODIUM CHLORIDE 0.9 % IV SOLN: INTRAVENOUS | Status: DC

## 2022-05-08 MED ORDER — PROPOFOL 10 MG/ML IV BOLUS
INTRAVENOUS | Status: DC | PRN
  Administered 2022-05-08: 50 mg via INTRAVENOUS
  Administered 2022-05-08: 100 mg via INTRAVENOUS
  Administered 2022-05-08: 50 mg via INTRAVENOUS

## 2022-05-08 MED ORDER — VANCOMYCIN HCL IN DEXTROSE 1-5 GM/200ML-% IV SOLN
1000.0000 mg | Freq: Once | INTRAVENOUS | Status: AC
  Administered 2022-05-08: 1000 mg via INTRAVENOUS
  Filled 2022-05-08: qty 200

## 2022-05-08 MED ORDER — PLASMA-LYTE A IV SOLN: INTRAVENOUS | Status: DC | PRN

## 2022-05-08 MED ORDER — PANTOPRAZOLE SODIUM 40 MG PO TBEC
40.0000 mg | DELAYED_RELEASE_TABLET | Freq: Every day | ORAL | Status: DC
  Administered 2022-05-10 – 2022-05-12 (×3): 40 mg via ORAL
  Filled 2022-05-08 (×3): qty 1

## 2022-05-08 MED ORDER — PROTAMINE SULFATE 10 MG/ML IV SOLN
INTRAVENOUS | Status: AC
  Filled 2022-05-08: qty 5

## 2022-05-08 MED ORDER — DEXTROSE 50 % IV SOLN: 0.0000 mL | INTRAVENOUS | Status: DC | PRN

## 2022-05-08 MED ORDER — PAPAVERINE HCL 30 MG/ML IJ SOLN
INTRAMUSCULAR | Status: AC
  Filled 2022-05-08: qty 2

## 2022-05-08 MED ORDER — PHENYLEPHRINE 80 MCG/ML (10ML) SYRINGE FOR IV PUSH (FOR BLOOD PRESSURE SUPPORT)
PREFILLED_SYRINGE | INTRAVENOUS | Status: AC
  Filled 2022-05-08: qty 10

## 2022-05-08 MED ORDER — ACETAMINOPHEN 650 MG RE SUPP: 650.0000 mg | Freq: Once | RECTAL | Status: DC

## 2022-05-08 MED ORDER — ORAL CARE MOUTH RINSE
15.0000 mL | OROMUCOSAL | Status: DC
  Administered 2022-05-08 – 2022-05-12 (×13): 15 mL via OROMUCOSAL

## 2022-05-08 MED ORDER — MIDAZOLAM HCL 2 MG/2ML IJ SOLN
2.0000 mg | INTRAMUSCULAR | Status: DC | PRN
  Administered 2022-05-08: 2 mg via INTRAVENOUS
  Filled 2022-05-08: qty 2

## 2022-05-08 MED ORDER — ACETAMINOPHEN 160 MG/5ML PO SOLN: 650.0000 mg | Freq: Once | ORAL | Status: DC

## 2022-05-08 MED ORDER — BISACODYL 5 MG PO TBEC
10.0000 mg | DELAYED_RELEASE_TABLET | Freq: Every day | ORAL | Status: DC
  Administered 2022-05-09 – 2022-05-11 (×3): 10 mg via ORAL
  Filled 2022-05-08 (×3): qty 2

## 2022-05-08 MED ORDER — LACTATED RINGERS IV SOLN: INTRAVENOUS | Status: DC

## 2022-05-08 MED ORDER — LEVOFLOXACIN IN D5W 750 MG/150ML IV SOLN
750.0000 mg | INTRAVENOUS | Status: AC
  Administered 2022-05-09: 750 mg via INTRAVENOUS
  Filled 2022-05-08: qty 150

## 2022-05-08 MED ORDER — SUCCINYLCHOLINE CHLORIDE 200 MG/10ML IV SOSY
PREFILLED_SYRINGE | INTRAVENOUS | Status: DC | PRN
  Administered 2022-05-08: 120 mg via INTRAVENOUS

## 2022-05-08 MED ORDER — OXYCODONE HCL 5 MG PO TABS
5.0000 mg | ORAL_TABLET | ORAL | Status: DC | PRN
  Administered 2022-05-08 – 2022-05-09 (×3): 10 mg via ORAL
  Filled 2022-05-08 (×3): qty 2

## 2022-05-08 MED ORDER — ACETAMINOPHEN 500 MG PO TABS
1000.0000 mg | ORAL_TABLET | Freq: Four times a day (QID) | ORAL | Status: DC
  Administered 2022-05-08 – 2022-05-12 (×14): 1000 mg via ORAL
  Filled 2022-05-08 (×13): qty 2

## 2022-05-08 MED ORDER — ALBUMIN HUMAN 5 % IV SOLN
250.0000 mL | INTRAVENOUS | Status: AC | PRN
  Administered 2022-05-08 (×4): 12.5 g via INTRAVENOUS
  Filled 2022-05-08 (×2): qty 250

## 2022-05-08 MED ORDER — SODIUM CHLORIDE 0.9 % IV SOLN: 250.0000 mL | INTRAVENOUS | Status: DC

## 2022-05-08 MED ORDER — POTASSIUM CHLORIDE 10 MEQ/50ML IV SOLN: 10.0000 meq | INTRAVENOUS | Status: AC

## 2022-05-08 MED ORDER — BISACODYL 10 MG RE SUPP: 10.0000 mg | Freq: Every day | RECTAL | Status: DC

## 2022-05-08 MED ORDER — DOCUSATE SODIUM 100 MG PO CAPS
200.0000 mg | ORAL_CAPSULE | Freq: Every day | ORAL | Status: DC
  Administered 2022-05-09 – 2022-05-11 (×3): 200 mg via ORAL
  Filled 2022-05-08 (×3): qty 2

## 2022-05-08 MED ORDER — CHLORHEXIDINE GLUCONATE 0.12 % MT SOLN
15.0000 mL | OROMUCOSAL | Status: AC
  Administered 2022-05-08: 15 mL via OROMUCOSAL
  Filled 2022-05-08: qty 15

## 2022-05-08 MED ORDER — SODIUM CHLORIDE 0.9% FLUSH: 3.0000 mL | INTRAVENOUS | Status: DC | PRN

## 2022-05-08 MED ORDER — PHENYLEPHRINE 80 MCG/ML (10ML) SYRINGE FOR IV PUSH (FOR BLOOD PRESSURE SUPPORT)
PREFILLED_SYRINGE | INTRAVENOUS | Status: DC | PRN
  Administered 2022-05-08 (×2): 40 ug via INTRAVENOUS
  Administered 2022-05-08: 160 ug via INTRAVENOUS
  Administered 2022-05-08: 40 ug via INTRAVENOUS

## 2022-05-08 MED ORDER — 0.9 % SODIUM CHLORIDE (POUR BTL) OPTIME
TOPICAL | Status: DC | PRN
  Administered 2022-05-08: 6000 mL

## 2022-05-08 MED ORDER — LACTATED RINGERS IV SOLN: 500.0000 mL | Freq: Once | INTRAVENOUS | Status: DC | PRN

## 2022-05-08 MED ORDER — SODIUM CHLORIDE 0.9% FLUSH
3.0000 mL | Freq: Two times a day (BID) | INTRAVENOUS | Status: DC
  Administered 2022-05-09 – 2022-05-12 (×7): 3 mL via INTRAVENOUS

## 2022-05-08 MED ORDER — PHENYLEPHRINE HCL-NACL 20-0.9 MG/250ML-% IV SOLN: 0.0000 ug/min | INTRAVENOUS | Status: DC

## 2022-05-08 MED ORDER — METOPROLOL TARTRATE 25 MG/10 ML ORAL SUSPENSION: 12.5000 mg | Freq: Two times a day (BID) | ORAL | Status: DC

## 2022-05-08 MED ORDER — ASPIRIN 81 MG PO CHEW
324.0000 mg | CHEWABLE_TABLET | Freq: Every day | ORAL | Status: DC
  Administered 2022-05-08: 324 mg
  Filled 2022-05-08: qty 4

## 2022-05-08 MED ORDER — SODIUM CHLORIDE 0.45 % IV SOLN: INTRAVENOUS | Status: DC | PRN

## 2022-05-08 MED ORDER — MIDAZOLAM HCL (PF) 10 MG/2ML IJ SOLN
INTRAMUSCULAR | Status: AC
  Filled 2022-05-08: qty 2

## 2022-05-08 MED ORDER — ASPIRIN 325 MG PO TBEC
325.0000 mg | DELAYED_RELEASE_TABLET | Freq: Every day | ORAL | Status: DC
  Administered 2022-05-09 – 2022-05-12 (×4): 325 mg via ORAL
  Filled 2022-05-08 (×4): qty 1

## 2022-05-08 MED ORDER — VANCOMYCIN HCL 1000 MG IV SOLR
INTRAVENOUS | Status: DC | PRN
  Administered 2022-05-08: 1000 mL

## 2022-05-08 MED ORDER — HEPARIN SODIUM (PORCINE) 1000 UNIT/ML IJ SOLN
INTRAMUSCULAR | Status: DC | PRN
  Administered 2022-05-08: 30000 [IU] via INTRAVENOUS

## 2022-05-08 MED ORDER — ONDANSETRON HCL 4 MG/2ML IJ SOLN
4.0000 mg | Freq: Four times a day (QID) | INTRAMUSCULAR | Status: DC | PRN
  Administered 2022-05-09 – 2022-05-10 (×3): 4 mg via INTRAVENOUS
  Filled 2022-05-08 (×4): qty 2

## 2022-05-08 MED ORDER — MIDAZOLAM HCL 2 MG/2ML IJ SOLN
INTRAMUSCULAR | Status: AC
  Filled 2022-05-08: qty 2

## 2022-05-08 MED ORDER — ORAL CARE MOUTH RINSE: 15.0000 mL | OROMUCOSAL | Status: DC | PRN

## 2022-05-08 MED ORDER — ALBUMIN HUMAN 5 % IV SOLN: INTRAVENOUS | Status: DC | PRN

## 2022-05-08 SURGICAL SUPPLY — 102 items
ADAPTER CARDIO PERF ANTE/RETRO (ADAPTER) ×2 IMPLANT
ADH SKN CLS APL DERMABOND .7 (GAUZE/BANDAGES/DRESSINGS) ×2
ADPR CRDPLG 7.5 .25D 1 LRG Y (ADAPTER) ×2
ADPR PRFSN 84XANTGRD RTRGD (ADAPTER) ×2
BAG DECANTER FOR FLEXI CONT (MISCELLANEOUS) ×2 IMPLANT
BLADE CLIPPER SURG (BLADE) ×2 IMPLANT
BLADE MICRO SHARP 3 15 DEG (BLADE) ×2 IMPLANT
BLADE STERNUM SYSTEM 6 (BLADE) ×2 IMPLANT
BNDG CMPR MED 10X6 ELC LF (GAUZE/BANDAGES/DRESSINGS) ×2
BNDG ELASTIC 4X5.8 VLCR STR LF (GAUZE/BANDAGES/DRESSINGS) ×2 IMPLANT
BNDG ELASTIC 6X10 VLCR STRL LF (GAUZE/BANDAGES/DRESSINGS) IMPLANT
BNDG ELASTIC 6X5.8 VLCR STR LF (GAUZE/BANDAGES/DRESSINGS) ×2 IMPLANT
BNDG GAUZE DERMACEA FLUFF 4 (GAUZE/BANDAGES/DRESSINGS) ×2 IMPLANT
BNDG GZE DERMACEA 4 6PLY (GAUZE/BANDAGES/DRESSINGS) ×2
BOOT SUTURE VASCULAR YLW (MISCELLANEOUS) ×2
CANISTER SUCT 3000ML PPV (MISCELLANEOUS) ×2 IMPLANT
CANNULA MC2 2 STG 36/46 CONN (CANNULA) IMPLANT
CANNULA NON VENT 20FR 12 (CANNULA) IMPLANT
CANNULA NON VENT 22FR 12 (CANNULA) ×2 IMPLANT
CANNULA VESSEL 3MM 2 BLNT TIP (CANNULA) IMPLANT
CATH RETROPLEGIA CORONARY 14FR (CATHETERS) ×2 IMPLANT
CATH ROBINSON RED A/P 18FR (CATHETERS) ×6 IMPLANT
CATH THOR STR 32F SOFT 20 RADI (CATHETERS) ×4 IMPLANT
CATH THORACIC 28FR RT ANG (CATHETERS) ×2 IMPLANT
CLAMP SUTURE YELLOW 5 PAIRS (MISCELLANEOUS) ×2 IMPLANT
CLIP TI LARGE 6 (CLIP) ×2 IMPLANT
CLIP TI MEDIUM 24 (CLIP) IMPLANT
CLIP TI WIDE RED SMALL 24 (CLIP) IMPLANT
CNTNR URN SCR LID CUP LEK RST (MISCELLANEOUS) IMPLANT
CONN Y 3/8X3/8X3/8  BEN (MISCELLANEOUS) ×4
CONN Y 3/8X3/8X3/8 BEN (MISCELLANEOUS) IMPLANT
CONT SPEC 4OZ STRL OR WHT (MISCELLANEOUS) ×4
CONTAINER PROTECT SURGISLUSH (MISCELLANEOUS) ×6 IMPLANT
DERMABOND ADVANCED .7 DNX12 (GAUZE/BANDAGES/DRESSINGS) IMPLANT
DRAPE CV SPLIT W-CLR ANES SCRN (DRAPES) ×2 IMPLANT
DRAPE INCISE IOBAN 66X45 STRL (DRAPES) ×2 IMPLANT
DRAPE PERI GROIN 82X75IN TIB (DRAPES) ×2 IMPLANT
DRAPE SLUSH/WARMER DISC (DRAPES) IMPLANT
DRAPE WARM FLUID 44X44 (DRAPES) ×2 IMPLANT
DRESSING AQUACEL AG ADV 3.5X12 (MISCELLANEOUS) ×2 IMPLANT
DRSG AQUACEL AG ADV 3.5X10 (GAUZE/BANDAGES/DRESSINGS) IMPLANT
DRSG AQUACEL AG ADV 3.5X12 (MISCELLANEOUS) ×2
ELECT BLADE 4.0 EZ CLEAN MEGAD (MISCELLANEOUS) ×2
ELECT CAUTERY BLADE 6.4 (BLADE) ×2 IMPLANT
ELECT REM PT RETURN 9FT ADLT (ELECTROSURGICAL) ×4
ELECTRODE BLDE 4.0 EZ CLN MEGD (MISCELLANEOUS) ×2 IMPLANT
ELECTRODE REM PT RTRN 9FT ADLT (ELECTROSURGICAL) ×4 IMPLANT
FELT TEFLON 1X6 (MISCELLANEOUS) ×2 IMPLANT
GAUZE SPONGE 4X4 12PLY STRL (GAUZE/BANDAGES/DRESSINGS) ×4 IMPLANT
GLOVE BIO SURGEON STRL SZ 6 (GLOVE) IMPLANT
GLOVE BIOGEL PI IND STRL 6.5 (GLOVE) IMPLANT
GLOVE SS BIOGEL STRL SZ 7.5 (GLOVE) IMPLANT
GLOVE SURG SS PI 7.5 STRL IVOR (GLOVE) IMPLANT
GOWN STRL REUS W/ TWL LRG LVL3 (GOWN DISPOSABLE) ×12 IMPLANT
GOWN STRL REUS W/ TWL XL LVL3 (GOWN DISPOSABLE) IMPLANT
GOWN STRL REUS W/TWL LRG LVL3 (GOWN DISPOSABLE)
GOWN STRL REUS W/TWL XL LVL3 (GOWN DISPOSABLE) ×16
INSERT FOGARTY 61MM (MISCELLANEOUS) IMPLANT
KIT BASIN OR (CUSTOM PROCEDURE TRAY) ×2 IMPLANT
KIT CATH CPB BARTLE (MISCELLANEOUS) ×2 IMPLANT
KIT SUCTION CATH 14FR (SUCTIONS) IMPLANT
KIT TURNOVER KIT B (KITS) ×2 IMPLANT
KIT VASOVIEW HEMOPRO 2 VH 4000 (KITS) ×2 IMPLANT
MARKER DISTAL GRAFT W/ HOLDER (MISCELLANEOUS) IMPLANT
NS IRRIG 1000ML POUR BTL (IV SOLUTION) ×10 IMPLANT
PACK E OPEN HEART (SUTURE) ×2 IMPLANT
PACK OPEN HEART (CUSTOM PROCEDURE TRAY) ×2 IMPLANT
PAD ARMBOARD 7.5X6 YLW CONV (MISCELLANEOUS) ×4 IMPLANT
PAD ELECT DEFIB RADIOL ZOLL (MISCELLANEOUS) ×2 IMPLANT
PENCIL BUTTON HOLSTER BLD 10FT (ELECTRODE) ×2 IMPLANT
POSITIONER HEAD DONUT 9IN (MISCELLANEOUS) ×2 IMPLANT
PUNCH AORTIC ROTATE 4.0MM (MISCELLANEOUS) ×2 IMPLANT
PUNCH AORTIC ROTATE 4.5MM 8IN (MISCELLANEOUS) IMPLANT
SET MPS 3-ND DEL (MISCELLANEOUS) IMPLANT
SET Y-VENT ADPTR 7.5 MALE LUER (ADAPTER) IMPLANT
STOPCOCK 4 WAY LG BORE MALE ST (IV SETS) IMPLANT
SUPPORT HEART JANKE-BARRON (MISCELLANEOUS) ×2 IMPLANT
SUT MNCRL AB 4-0 PS2 18 (SUTURE) ×4 IMPLANT
SUT PROLENE 4 0 RB 1 (SUTURE) ×8
SUT PROLENE 4 0 SH DA (SUTURE) ×2 IMPLANT
SUT PROLENE 4-0 RB1 .5 CRCL 36 (SUTURE) ×2 IMPLANT
SUT PROLENE 5 0 C 1 36 (SUTURE) IMPLANT
SUT PROLENE 6 0 C 1 30 (SUTURE) IMPLANT
SUT PROLENE 7 0 BV1 MDA (SUTURE) ×2 IMPLANT
SUT STEEL 6MS V (SUTURE) IMPLANT
SUT STEEL SZ 6 DBL 3X14 BALL (SUTURE) ×4 IMPLANT
SUT TEM PAC WIRE 2 0 SH (SUTURE) IMPLANT
SUT VIC AB 0 CTX 36 (SUTURE) ×4
SUT VIC AB 0 CTX36XBRD ANTBCTR (SUTURE) ×4 IMPLANT
SUT VIC AB 2-0 CT1 27 (SUTURE) ×6
SUT VIC AB 2-0 CT1 TAPERPNT 27 (SUTURE) ×4 IMPLANT
SUT VIC AB 3-0 X1 27 (SUTURE) IMPLANT
SYSTEM SAHARA CHEST DRAIN ATS (WOUND CARE) ×2 IMPLANT
TAG SUTURE CLAMP YLW 5PR (MISCELLANEOUS) ×2
TAPE CLOTH SURG 4X10 WHT LF (GAUZE/BANDAGES/DRESSINGS) IMPLANT
TOWEL GREEN STERILE (TOWEL DISPOSABLE) ×2 IMPLANT
TOWEL GREEN STERILE FF (TOWEL DISPOSABLE) ×2 IMPLANT
TRAY FOLEY SLVR 16FR TEMP STAT (SET/KITS/TRAYS/PACK) ×2 IMPLANT
TUBING ART PRESS 48 MALE/FEM (TUBING) IMPLANT
TUBING LAP HI FLOW INSUFFLATIO (TUBING) ×2 IMPLANT
UNDERPAD 30X36 HEAVY ABSORB (UNDERPADS AND DIAPERS) ×2 IMPLANT
WATER STERILE IRR 1000ML POUR (IV SOLUTION) ×4 IMPLANT

## 2022-05-08 NOTE — Hospital Course (Addendum)
Reason for Consult:3 vessel CAD, s/p nonSTEMI Referring Physician: Dr. Audie Box    HPI: At time of CT surgical evaluation 77 yo with known CAD presents with unstable CP.   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   He was having CP for about a week prior to admission, but thought it was reflux.  Was working in his yard yesterday when he had severe squeezing substernal pain.  In ED initial troponin elevated.  Started on IV heparin.  Second troponin 5099,  Had ongoing pain and taken to cath lab.  Found to have patent stents but severe 3 vessel CAD.  Currently pain free.  The patient and all relevant studies were reviewed by Dr. Lavonna Monarch who recommended proceeding with CABG.  Hospital course: Following medical stabilization and full diagnostic workup patient was felt to be stable to proceed and on 05/08/2022 he was taken to the operating room at which time he underwent CABG x 4.  He tolerated procedure well was taken to the surgical intensive care unit in stable condition.  Postoperative hospital course:  Patient was extubated using standard post cardiac surgical protocols without difficulty on the day of surgery.  He has remained hemodynamically stable.  Swan-Ganz and arterial line were discontinued on postop day #1.  Renal function has remained within normal limits.  He does show some expected postoperative volume overload and was started on a course of diuretics.  He does have a mild expected acute blood loss anemia which is stabilized.  Most recent hemoglobin and hematocrit on 03/17 was 8.8 and 26.4. He also had  a mild reactive postoperative thrombocytopenia but it has trended better over time. He was started on a course of routine pulmonary hygiene and cardiac rehab modalities. He was transitioned off the Insulin drip. His pre op HGA1C was 7. He will need to  follow up with his medical doctor after discharge. He was started on Plavix and ec asa was decreased to 81 (s/p NSTEMI). He is felt surgically stable for transfer from the ICU to 4E for further convalescence. He is ambulating on room air with good oxygenation. All wounds are clean, dry, healing without signs of infection. He has been tolerating a diet and has had a bowel movement.

## 2022-05-08 NOTE — Procedures (Signed)
Extubation Procedure Note  Patient Details:   Name: [name] DOB: 10/17/1945 MRN: [id number]   Airway Documentation:    Vent end date: 05/08/22 Vent end time: 1623   Evaluation  O2 sats: currently acceptable Complications: No apparent complications Patient did tolerate procedure well. Bilateral Breath Sounds: Clear   Pt extubated with Dr Tacy Learn at bedside & placed on 4L [place]. SpO2 fell to 90%, increased to 6L. Pt met weaning parameters prior with NIF  -30 cmH2O, VC 900 ml, + cuff leak. Pt able to speak & cough post extubation.  Kathie Dike 05/08/2022, 4:31 PM

## 2022-05-08 NOTE — Consult Note (Signed)
NAME:  [name], MRN:  [id number], DOB:  [birth year], LOS: 6 ADMISSION DATE:  05/02/2022, CONSULTATION DATE: 05/08/2022 REFERRING MD: Dr. Coralie Common, CHIEF COMPLAINT: Chest pain  History of Present Illness:  77 year old male with coronary artery disease and ischemic cardiomyopathy s/p multiple stents, hypertension and hyperlipidemia who was admitted with acute NSTEMI, he underwent cardiac catheterization which showed multivessel coronary artery disease, underwent CABG x 4.  Remained intubated, PCCM was consulted for help evaluation medical management  Pertinent  Medical History   Past Medical History:  Diagnosis Date   Anxiety    Arthritis    "wrists" (07/27/2014)   Coronary artery disease    a.  Pt with 4 stents over the course of 2003-2012;  b.  LHC 6/16:  LAD, RCA, LCx stents patent, OM1 50%, EF 50-55% >> med rx    Elevated LFTs    GERD (gastroesophageal reflux disease)    Hypercholesterolemia    Hypertension    Ischemic cardiomyopathy    cMRI 03/2011:  EF 48% with dist ant, septal, apical and inf-apical dyskinesia, no apical clot, dist ant, apical and septal full thickness scar >> No ICD needed  //  Limited echo 7/19: Large apical septal aneurysm, severe hypokinesis of the entire apex and mid to apical segments of the anterior wall, anterolateral wall and anterior septum-consistent with infarct and most of the LAD territory; EF 35    Left ventricular aneurysm    Limited echo 7/19: Large apical septal aneurysm, severe hypokinesis of the entire apex and mid to apical segments of the anterior wall, anterolateral wall and anterior septum-consistent with infarct and most of the LAD territory; EF 35   Myocardial infarction ([place]) 2003     Significant Hospital Events: Including procedures, antibiotic start and stop dates in addition to other pertinent events     Interim History / Subjective:    Objective   Blood pressure 123/65, pulse (!) 58, temperature 97.9 F (36.6 C),  temperature source Oral, resp. rate (!) 9, height '5\' 7"'$  (1.702 m), weight 84.3 kg, SpO2 97 %.        Intake/Output Summary (Last 24 hours) at 05/08/2022 1205 Last data filed at 05/08/2022 1142 Gross per 24 hour  Intake 1582.27 ml  Output 4460 ml  Net -2877.73 ml   Filed Weights   05/03/22 0600 05/04/22 0600 05/05/22 0500  Weight: 81.9 kg 84 kg 84.3 kg    Examination:   Physical exam: General: Crtitically ill-appearing male, orally intubated HEENT: [place]/AT, eyes anicteric.  ETT and OGT in place Neuro: Sedated, not following commands.  Eyes are closed.  Pupils 3 mm bilateral reactive to light Chest: Central sternotomy wound looks clean and dry, coarse breath sounds, no wheezes or rhonchi.  Chest tubes in place Heart: Regular rate and rhythm, no murmurs or gallops Abdomen: Soft, nondistended, bowel sounds present Skin: No rash  Labs and images were reviewed  Resolved Hospital Problem list     Assessment & Plan:  Coronary artery disease s/p CABG x 4 Continue aspirin and Zetia, intolerant Chest tube management TCTS Continue to titrate Precedex with RASS goal 0/-1 Continue pain control with tramadol, oxycodone and morphine Monitor chest tube output Monitor H&H  Acute respiratory insufficiency, postop Continue on protective ventilation VAP prevention bundle in place Rapid weaning protocol ordered is in place  Chronic HFrEF Hold GDMT for now, consider restarting prior to discharge  Hypertension Holding antihypertensive for now, as patient is requiring low-dose phenylephrine  Hyperlipidemia Continue atorvastatin  Diabetes type  2 Patient hemoglobin A1c is 7 Currently on insulin infusion, monitor fingerstick with goal 140-180   Best Practice (right click and "Reselect all SmartList Selections" daily)   Diet/type: NPO DVT prophylaxis: SCD GI prophylaxis: H2B and PPI Lines: Central line, Arterial Line, and yes and it is still needed Foley:  Yes, and it is still  needed Code Status:  full code Last date of multidisciplinary goals of care discussion [Per primary team]  Labs   CBC: Recent Labs  Lab 05/02/22 1514 05/03/22 0147 05/04/22 0014 05/05/22 0325 05/06/22 0058 05/07/22 0022 05/08/22 0612 05/08/22 0804 05/08/22 1011 05/08/22 1031 05/08/22 1037 05/08/22 1102 05/08/22 1106  WBC 7.1   < > 6.4 5.8 5.7 5.3 4.9  --   --   --   --   --   --   NEUTROABS 4.9  --   --   --   --   --   --   --   --   --   --   --   --   HGB 12.9*   < > 12.8* 13.3 13.3 12.5* 12.7*   < > 9.2* 9.6* 8.8* 8.8* 9.2*  HCT 39.2   < > 36.6* 38.8* 38.4* 35.4* 37.7*   < > 27.0* 27.6* 26.0* 26.0* 27.0*  MCV 94.7   < > 92.7 91.7 92.3 91.5 93.1  --   --   --   --   --   --   PLT 243   < > 219 209 172 184 189  --   --  166  --   --   --    < > = values in this interval not displayed.    Basic Metabolic Panel: Recent Labs  Lab 05/04/22 0014 05/05/22 0325 05/06/22 0058 05/07/22 0022 05/08/22 0612 05/08/22 0804 05/08/22 0809 05/08/22 [id number] 05/08/22 0946 05/08/22 0957 05/08/22 1011 05/08/22 1037 05/08/22 1102 05/08/22 1106  NA 136 136 140 136 136   < > 139 138   < > 138 137 137 137 137  K 3.6 3.8 3.9 4.0 3.8   < > 4.1 3.9   < > 5.9* 5.4* 5.6* 5.4* 5.4*  CL 102 103 99 101 104  --  104 103  --  104  --  103  --  103  CO2 21* 24 19* 24 21*  --   --   --   --   --   --   --   --   --   GLUCOSE 124* 120* 99 110* 106*  --  131* 115*  --  126*  --  158*  --  160*  BUN '18 18 19 18 18  '$ --  17 17  --  15  --  15  --  15  CREATININE 1.30* 1.47* 1.21 1.45* 1.39*  --  1.20 1.10  --  0.90  --  1.00  --  1.00  CALCIUM 9.1 9.4 8.3* 9.3 9.2  --   --   --   --   --   --   --   --   --    < > = values in this interval not displayed.   GFR: Estimated Creatinine Clearance: 65.2 mL/min (by C-G formula based on SCr of 1 mg/dL). Recent Labs  Lab 05/05/22 0325 05/06/22 0058 05/07/22 0022 05/08/22 0612  WBC 5.8 5.7 5.3 4.9    Liver Function Tests: Recent Labs  Lab  05/02/22 1514  AST 39  ALT 43  ALKPHOS 40  BILITOT 0.8  PROT 6.3*  ALBUMIN 3.6   No results for input(s): "LIPASE", "AMYLASE" in the last 168 hours. No results for input(s): "AMMONIA" in the last 168 hours.  ABG    Component Value Date/Time   PHART 7.368 05/08/2022 1102   PCO2ART 37.7 05/08/2022 1102   PO2ART 287 (H) 05/08/2022 1102   HCO3 21.7 05/08/2022 1102   TCO2 24 05/08/2022 1106   ACIDBASEDEF 3.0 (H) 05/08/2022 1102   O2SAT 100 05/08/2022 1102     Coagulation Profile: No results for input(s): "INR", "PROTIME" in the last 168 hours.  Cardiac Enzymes: No results for input(s): "CKTOTAL", "CKMB", "CKMBINDEX", "TROPONINI" in the last 168 hours.  HbA1C: Hgb A1c MFr Bld  Date/Time Value Ref Range Status  05/06/2022 07:46 AM 7.0 (H) 4.8 - 5.6 % Final    Comment:    (NOTE)         Prediabetes: 5.7 - 6.4         Diabetes: >6.4         Glycemic control for adults with diabetes: <7.0   01/08/2011 08:41 PM 6.0 (H) <5.7 % Final    Comment:    (NOTE)                                                                       According to the ADA Clinical Practice Recommendations for 2011, when HbA1c is used as a screening test:  >=6.5%   Diagnostic of Diabetes Mellitus           (if abnormal result is confirmed) 5.7-6.4%   Increased risk of developing Diabetes Mellitus References:Diagnosis and Classification of Diabetes Mellitus,Diabetes [id number] 1):S62-S69 and Standards of Medical Care in         Diabetes - 2011,Diabetes [id number] (Suppl 1):S11-S61.    CBG: Recent Labs  Lab 05/03/22 0023  GLUCAP 113*    Review of Systems:   Unable to obtain as patient is intubated and sedated  Past Medical History:  He,  has a past medical history of Anxiety, Arthritis, Coronary artery disease, Elevated LFTs, GERD (gastroesophageal reflux disease), Hypercholesterolemia, Hypertension, Ischemic cardiomyopathy, Left ventricular aneurysm, and Myocardial infarction ([place])  (2003).   Surgical History:   Past Surgical History:  Procedure Laterality Date   CARDIAC CATHETERIZATION  06/11/2012   Nonobstructive CAD, patent stents, EF 50%, mild apical dyskinesis   CARDIAC CATHETERIZATION N/A 07/28/2014   Procedure: Left Heart Cath and Coronary Angiography;  Surgeon: Troy Sine, MD;  Location: [facility];  Service: Cardiovascular;  Laterality: N/A;   CORONARY ANGIOPLASTY WITH STENT PLACEMENT  2003-2012    total of 4 stents place   CORONARY STENT INTERVENTION N/A 10/20/2017   Procedure: CORONARY STENT INTERVENTION;  Surgeon: Sherren Mocha, MD;  Location: [facility];  Service: Cardiovascular;  Laterality: N/A;   COSMETIC SURGERY Left 1970's   "dog ripped of my ear"   LEFT HEART CATH AND CORONARY ANGIOGRAPHY N/A 10/20/2017   Procedure: LEFT HEART CATH AND CORONARY ANGIOGRAPHY;  Surgeon: Sherren Mocha, MD;  Location: [facility];  Service: Cardiovascular;  Laterality: N/A;   LEFT HEART CATH AND CORONARY ANGIOGRAPHY N/A 05/02/2022   Procedure: LEFT HEART CATH AND CORONARY ANGIOGRAPHY;  Surgeon: Leonie Man, MD;  Location: [facility];  Service: Cardiovascular;  Laterality: N/A;   LEFT HEART CATHETERIZATION WITH CORONARY ANGIOGRAM N/A 06/11/2012   Procedure: LEFT HEART CATHETERIZATION WITH CORONARY ANGIOGRAM;  Surgeon: Peter M Martinique, MD;  Location: [facility];  Service: Cardiovascular;  Laterality: N/A;     Social History:   reports that he has quit smoking. His smoking use included cigarettes. He has a 15.00 pack-year smoking history. He has never used smokeless tobacco. He reports that he does not currently use alcohol after a past usage of about 2.0 standard drinks of alcohol per week. He reports that he does not use drugs.   Family History:  His family history includes Coronary artery disease in his father and mother.   Allergies Allergies  Allergen Reactions   Asa [Aspirin] Other (See Comments)    Abdominal bleeding    Penicillins Anaphylaxis    Has patient had a PCN reaction causing immediate rash, facial/tongue/throat swelling, SOB or lightheadedness with hypotension: Yes Has patient had a PCN reaction causing severe rash involving mucus membranes or skin necrosis: No Has patient had a PCN reaction that required hospitalization: No Has patient had a PCN reaction occurring within the last 10 years: No If all of the above answers are "NO", then may proceed with Cephalosporin use.   Statins Other (See Comments)    Stiff joints, muscle tightness, couldn't walk     Home Medications  Prior to Admission medications   Medication Sig Start Date End Date Taking? Authorizing Provider  acetaminophen (TYLENOL) 500 MG tablet Take 1,000 mg by mouth as needed for moderate pain.   Yes [provider]  carvedilol (COREG) 3.125 MG tablet TAKE 1 TABLET BY MOUTH TWICE A DAY WITH FOOD Patient taking differently: Take 3.125 mg by mouth in the morning and at bedtime. 04/21/22  Yes Josue Hector, MD  clopidogrel (PLAVIX) 75 MG tablet Take 1 tablet (75 mg total) by mouth daily. 06/11/12  Yes Arguello, Roger A, PA-C  LORazepam (ATIVAN) 0.5 MG tablet Take 0.5 mg by mouth as needed for anxiety. 11/06/21  Yes [provider]  METAMUCIL FIBER PO Take 1 Scoop by mouth as needed (constipation).   Yes [provider]  nitroGLYCERIN (NITROSTAT) 0.4 MG SL tablet PLACE 1 TABLET (0.4 MG TOTAL) UNDER THE TONGUE EVERY 5 (FIVE) MINUTES AS NEEDED FOR CHEST PAIN. FOR A MAX OF 3 DOSES. IF NO RELIEF, CALL 911. Patient taking differently: Place 0.4 mg under the tongue every 5 (five) minutes as needed for chest pain. 06/03/21  Yes Josue Hector, MD  omega-3 acid ethyl esters (LOVAZA) 1 G capsule Take 1 g by mouth 2 (two) times daily.   Yes [provider]  OVER THE COUNTER MEDICATION Take 1 tablet by mouth in the morning and at bedtime. Beet Root chew   Yes [provider]  pantoprazole (PROTONIX) 40 MG  tablet Take 1 tablet (40 mg total) by mouth daily. 04/26/12  Yes Josue Hector, MD  sacubitril-valsartan (ENTRESTO) 97-103 MG Take 1 tablet by mouth 2 (two) times daily. 03/04/22  Yes Josue Hector, MD  zolpidem (AMBIEN) 10 MG tablet Take 1 tablet (10 mg total) by mouth at bedtime as needed. For sleep Patient taking differently: Take 5-10 mg by mouth at bedtime as needed for sleep. 07/06/12  Yes Josue Hector, MD     Critical care time:     This patient is critically ill with multiple organ  system failure which requires frequent high complexity decision making, assessment, support, evaluation, and titration of therapies. This was completed through the application of advanced monitoring technologies and extensive interpretation of multiple databases.  During this encounter critical care time was devoted to patient care services described in this note for 34 minutes.    Jacky Kindle, MD [facility] See Amion for pager If no response to pager, please call [phone] until 7pm After 7pm, Please call E-link [phone]

## 2022-05-08 NOTE — Brief Op Note (Signed)
05/02/2022 - 05/08/2022  12:43 PM  PATIENT:  [name]  77 y.o. male  PRE-OPERATIVE DIAGNOSIS:  coronary artery disease  POST-OPERATIVE DIAGNOSIS:  coronary artery disease  PROCEDURE:  Procedure(s): CORONARY ARTERY BYPASS GRAFTING (CABG) x4, USING LEFT INTERNAL MAMMARY ARTERY AND ENDOSCOPICALLY HARVESTED RIGHT GREATER SAPHENOUS VEIN (N/A) TRANSESOPHAGEAL ECHOCARDIOGRAM (N/A) Vein harvest time: 25mn Vein prep time: 128m  SURGEON:  Surgeon(s) and Role:    * Coralie CommonMD - Primary  PHYSICIAN ASSISTANT: Isamar Nazir PA-C  ASSISTANTS: RNFA STAFF   ANESTHESIA:   general  EBL:  810 mL   BLOOD ADMINISTERED:none  DRAINS:  LEFT PLEURAL AND MEDIASTINAL CHEST TUBES    LOCAL MEDICATIONS USED:  NONE  SPECIMEN:  No Specimen  DISPOSITION OF SPECIMEN:  N/A  COUNTS:  YES  TOURNIQUET:  * No tourniquets in log *  DICTATION: .Dragon Dictation  PLAN OF CARE: Admit to inpatient   PATIENT DISPOSITION:  ICU - intubated and hemodynamically stable.   Delay start of Pharmacological VTE agent (>24hrs) due to surgical blood loss or risk of bleeding: yes  COMPLICATIONS: NO KNOWN

## 2022-05-08 NOTE — Interval H&P Note (Signed)
History and Physical Interval Note:  05/08/2022 6:31 AM  [name]  has presented today for surgery, with the diagnosis of CAD.  The various methods of treatment have been discussed with the patient and family. After consideration of risks, benefits and other options for treatment, the patient has consented to  Procedure(s): CORONARY ARTERY BYPASS GRAFTING (CABG) (N/A) TRANSESOPHAGEAL ECHOCARDIOGRAM (N/A) as a surgical intervention.  The patient's history has been reviewed, patient examined, no change in status, stable for surgery.  I have reviewed the patient's chart and labs.  Questions were answered to the patient's satisfaction.     Coralie Common

## 2022-05-08 NOTE — Transfer of Care (Signed)
Immediate Anesthesia Transfer of Care Note  Patient: Cypress Tinnes  Procedure(s) Performed: CORONARY ARTERY BYPASS GRAFTING (CABG) x4, USING LEFT INTERNAL MAMMARY ARTERY AND ENDOSCOPICALLY HARVESTED RIGHT GREATER SAPHENOUS VEIN (Chest) TRANSESOPHAGEAL ECHOCARDIOGRAM  Patient Location: SICU  Anesthesia Type:General  Level of Consciousness: Patient remains intubated per anesthesia plan  Airway & Oxygen Therapy: Patient remains intubated per anesthesia plan and Patient placed on Ventilator (see vital sign flow sheet for setting)  Post-op Assessment: Report given to RN and Post -op Vital signs reviewed and stable  Post vital signs: Reviewed and stable  Last Vitals:  Vitals Value Taken Time  BP 113/55   Temp    Pulse 80 05/08/22 1203  Resp 16 05/08/22 1203  SpO2 99 % 05/08/22 1203  Vitals shown include unvalidated device data.  Last Pain:  Vitals:   05/08/22 0400  TempSrc:   PainSc: 0-No pain      Patients Stated Pain Goal: 0 ([id number])  Complications: No notable events documented.

## 2022-05-08 NOTE — Anesthesia Preprocedure Evaluation (Signed)
Anesthesia Evaluation    Airway Mallampati: II  TM Distance: >3 FB Neck ROM: Full    Dental no notable dental hx.    Pulmonary former smoker   Pulmonary exam normal breath sounds clear to auscultation       Cardiovascular hypertension, + CAD, + Past MI and + Cardiac Stents  Normal cardiovascular exam+ Valvular Problems/Murmurs AI  Rhythm:Regular Rate:Normal  Left Ventricle: The apex is akinetic and mildly aneurysmal. There is no  visualized apical thrombus. Left ventricular ejection fraction, by  estimation, is 50%. The left ventricle has normal function. Left  ventricular endocardial border not optimally  defined to evaluate regional wall motion. Definity contrast agent was  given IV to delineate the left ventricular endocardial borders. The left  ventricular internal cavity size was normal in size. There is no left  ventricular hypertrophy. Left ventricular   diastolic parameters are consistent with Grade I diastolic dysfunction  (impaired relaxation). Normal left ventricular filling pressure.   Right Ventricle: The right ventricular size is normal. Right vetricular  wall thickness was not well visualized. Right ventricular systolic  function is normal. Tricuspid regurgitation signal is inadequate for  assessing PA pressure.   Left Atrium: Left atrial size was normal in size.   Right Atrium: Right atrial size was normal in size.   Pericardium: There is no evidence of pericardial effusion.   Mitral Valve: The mitral valve is normal in structure. There is mild  thickening of the mitral valve leaflet(s). There is mild calcification of  the mitral valve leaflet(s). Mild mitral annular calcification. Trivial  mitral valve regurgitation. 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 MV peak gradient, 13.8 mmHg. The mean  mitral valve gradient is 6.0 mmHg.   Tricuspid Valve: The tricuspid valve is normal in structure. Tricuspid  valve  regurgitation is not demonstrated. No evidence of tricuspid  stenosis.   Aortic Valve: The aortic valve has an indeterminant number of cusps.  Aortic valve regurgitation is moderate. Aortic regurgitation PHT measures  759 msec. No aortic stenosis is present. Aortic valve mean gradient  measures 4.9 mmHg. Aortic valve peak  gradient measures 11.3 mmHg. Aortic valve area, by VTI measures 2.19 cm.   Pulmonic Valve: The pulmonic valve was not well visualized. Pulmonic valve  regurgitation is not visualized. No evidence of pulmonic stenosis.   Aorta: The aortic root is normal in size and structure and aortic  dilatation noted. There is mild dilatation of the ascending aorta,  measuring 37 mm.   Venous: The inferior vena cava is normal in size with less than 50%  respiratory variability, suggesting right atrial pressure of 8 mmHg     Neuro/Psych    GI/Hepatic   Endo/Other  diabetes, Type 2    Renal/GU Renal InsufficiencyRenal disease     Musculoskeletal   Abdominal   Peds  Hematology   Anesthesia Other Findings   Reproductive/Obstetrics                             Anesthesia Physical Anesthesia Plan  ASA: 4  Anesthesia Plan: General   Post-op Pain Management:    Induction: Intravenous  PONV Risk Score and Plan:   Airway Management Planned:   Additional Equipment: Arterial line, CVP, PA Cath, TEE and Ultrasound Guidance Line Placement  Intra-op Plan:   Post-operative Plan: Post-operative intubation/ventilation  Informed Consent: I have reviewed the patients History and Physical, chart, labs and discussed the procedure including the risks,  benefits and alternatives for the proposed anesthesia with the patient or authorized representative who has indicated his/her understanding and acceptance.     Dental advisory given  Plan Discussed with: CRNA and Surgeon  Anesthesia Plan Comments:        Anesthesia Quick Evaluation

## 2022-05-08 NOTE — Anesthesia Procedure Notes (Signed)
Central Venous Catheter Insertion Performed by: Albertha Ghee, MD, anesthesiologist Start/End3/14/2024 6:55 AM, 05/08/2022 7:06 AM Patient location: Pre-op. Preanesthetic checklist: patient identified, IV checked, site marked, risks and benefits discussed, surgical consent, monitors and equipment checked, pre-op evaluation, timeout performed and anesthesia consent Lidocaine 1% used for infiltration and patient sedated Hand hygiene performed  and maximum sterile barriers used  Catheter size: 9 Fr Central line was placed.MAC introducer Procedure performed using ultrasound guided technique. Ultrasound Notes:anatomy identified, needle tip was noted to be adjacent to the nerve/plexus identified, no ultrasound evidence of intravascular and/or intraneural injection and image(s) printed for medical record Attempts: 1 Following insertion, line sutured and dressing applied. Post procedure assessment: blood return through all ports, free fluid flow and no air  Patient tolerated the procedure well with no immediate complications.

## 2022-05-08 NOTE — Progress Notes (Signed)
[address]       [place] [address]             [phone]      Day of Surgery  Procedure(s) (LRB): CORONARY ARTERY BYPASS GRAFTING (CABG) x4, USING LEFT INTERNAL MAMMARY ARTERY AND ENDOSCOPICALLY HARVESTED RIGHT GREATER SAPHENOUS VEIN (N/A) TRANSESOPHAGEAL ECHOCARDIOGRAM (N/A)   Total Length of Stay:  LOS: 6 days    SUBJECTIVE: Extubated about an hour ago, just back supine after sitting up on side of the bed.  Says pain is controlled.    Vitals:   05/08/22 1655 05/08/22 1700  BP:  111/71  Pulse: 80 82  Resp: 13 (!) 21  Temp: 99.1 F (37.3 C) 99 F (37.2 C)  SpO2: 98% 98%    Intake/Output      03/13 0701 03/14 0700 03/14 0701 03/15 0700   P.O.     I.V. (mL/kg) 494.7 (5.9) 501.2 (5.9)   Blood  450   IV Piggyback  1329.9   Total Intake(mL/kg) 494.7 (5.9) 2281.1 (27.1)   Urine (mL/kg/hr) 1600 (0.8) 3185 (3.5)   Stool 0    Blood  810   Chest Tube  240   Total Output 1600 4235   Net -1105.3 -1953.9        Urine Occurrence 2 x    Stool Occurrence 2 x        sodium chloride     [START ON 05/09/2022] sodium chloride     sodium chloride 20 mL/hr at 05/08/22 1700   dexmedetomidine (PRECEDEX) IV infusion Stopped (05/08/22 1416)   famotidine (PEPCID) IV Stopped (05/08/22 1248)   insulin Stopped (05/08/22 1208)   lactated ringers     lactated ringers     lactated ringers 20 mL/hr at 05/08/22 1700   [START ON 05/09/2022] levofloxacin (LEVAQUIN) IV     nitroGLYCERIN     phenylephrine (NEO-SYNEPHRINE) Adult infusion Stopped (05/08/22 1420)   vancomycin      CBC    Component Value Date/Time   WBC 9.0 05/08/2022 1211   RBC 3.29 (L) 05/08/2022 1211   HGB 9.2 (L) 05/08/2022 1726   HGB 15.3 06/16/2019 1102   HCT 27.0 (L) 05/08/2022 1726   HCT 45.1 06/16/2019 1102   PLT 124 (L) 05/08/2022 1211   PLT 238 06/16/2019 1102   MCV 94.2 05/08/2022 1211   MCV 94 06/16/2019 1102   MCH 31.6 05/08/2022 1211   MCHC 33.5 05/08/2022 1211   RDW  14.8 05/08/2022 1211   RDW 13.8 06/16/2019 1102   LYMPHSABS 1.6 05/02/2022 1514   MONOABS 0.4 05/02/2022 1514   EOSABS 0.2 05/02/2022 1514   BASOSABS 0.0 05/02/2022 1514   CMP     Component Value Date/Time   NA 138 05/08/2022 1726   NA 139 06/16/2019 1102   K 4.6 05/08/2022 1726   CL 103 05/08/2022 1106   CO2 21 (L) 05/08/2022 0612   GLUCOSE 160 (H) 05/08/2022 1106   BUN 15 05/08/2022 1106   BUN 17 06/16/2019 1102   CREATININE 1.00 05/08/2022 1106   CALCIUM 9.2 05/08/2022 0612   PROT 6.3 (L) 05/02/2022 1514   PROT 7.3 06/24/2019 1333   ALBUMIN 3.6 05/02/2022 1514   ALBUMIN 4.7 06/24/2019 1333   AST 39 05/02/2022 1514   ALT 43 05/02/2022 1514   ALKPHOS 40 05/02/2022 1514   BILITOT 0.8 05/02/2022 1514   BILITOT 0.5 06/24/2019 1333   GFRNONAA 53 (L) 05/08/2022 0612   GFRAA >60  08/11/2019 0445   ABG    Component Value Date/Time   PHART 7.360 05/08/2022 1726   PCO2ART 37.2 05/08/2022 1726   PO2ART 148 (H) 05/08/2022 1726   HCO3 21.0 05/08/2022 1726   TCO2 22 05/08/2022 1726   ACIDBASEDEF 4.0 (H) 05/08/2022 1726   O2SAT 99 05/08/2022 1726   CBG (last 3)  Recent Labs    05/08/22 1503 05/08/22 1559 05/08/22 1657  GLUCAP 106* 116* 123*     ASSESSMENT: Stable since surgery. Currently in SR at 29  with pacer AAI at 80. CI 2.3 on no pressors or inotropes. Low CT output, UO adequate.    Antony Odea, PA-C 05/08/2022

## 2022-05-08 NOTE — Anesthesia Procedure Notes (Signed)
Procedure Name: Intubation Date/Time: 05/08/2022 7:36 AM  Performed by: Wilburn Cornelia, CRNAPre-anesthesia Checklist: Patient identified, Emergency Drugs available, Suction available, Patient being monitored and Timeout performed Patient Re-evaluated:Patient Re-evaluated prior to induction Oxygen Delivery Method: Circle system utilized Preoxygenation: Pre-oxygenation with 100% oxygen Induction Type: IV induction Ventilation: Mask ventilation without difficulty Laryngoscope Size: Mac and 4 Grade View: Grade II Tube type: Oral Tube size: 8.0 mm Number of attempts: 1 Airway Equipment and Method: Stylet Placement Confirmation: ETT inserted through vocal cords under direct vision, positive ETCO2, CO2 detector and breath sounds checked- equal and bilateral Secured at: 24 cm Tube secured with: Tape Dental Injury: Teeth and Oropharynx as per pre-operative assessment

## 2022-05-08 NOTE — Anesthesia Procedure Notes (Signed)
Central Venous Catheter Insertion Performed by: Albertha Ghee, MD, anesthesiologist Start/End3/14/2024 7:06 AM, 05/08/2022 7:07 AM Patient location: Pre-op. Preanesthetic checklist: patient identified, IV checked, site marked, risks and benefits discussed, surgical consent, monitors and equipment checked, pre-op evaluation, timeout performed and anesthesia consent Hand hygiene performed  and maximum sterile barriers used  PA cath was placed.Swan type:thermodilution Procedure performed without using ultrasound guided technique. Attempts: 1 Patient tolerated the procedure well with no immediate complications.

## 2022-05-08 NOTE — Anesthesia Procedure Notes (Signed)
Arterial Line Insertion Start/End3/14/2024 7:00 AM, 05/08/2022 7:05 AM Performed by: Wilburn Cornelia, CRNA, CRNA  Patient location: Pre-op. Preanesthetic checklist: patient identified, IV checked, site marked, risks and benefits discussed, surgical consent, monitors and equipment checked, pre-op evaluation, timeout performed and anesthesia consent Lidocaine 1% used for infiltration and patient sedated Left, radial was placed Catheter size: 20 G Hand hygiene performed  and maximum sterile barriers used  Allen's test indicative of satisfactory collateral circulation Attempts: 1 Procedure performed without using ultrasound guided technique. Following insertion, Biopatch and dressing applied. Post procedure assessment: normal  Patient tolerated the procedure well with no immediate complications.

## 2022-05-08 NOTE — Op Note (Signed)
[address]       [place] [address]             [phone]                                          05/08/2022 Patient:  [name] Pre-Op Dx: Coronary artery Disease and NSTEMI   Post-op Dx:  Same Procedure: CABG X 4 with the LIMA to the LAD and RSVG from the aorta to the PDA and from the aorta to the OM1 sequenced to OM2   Endoscopic greater saphenous vein harvest on the right   Surgeon and Role:      Coralie Common, MD- Primary    * [place] An experienced assistant was required given the complexity of this surgery and the standard of surgical care. The assistant was needed for exposure, dissection, suctioning, retraction of delicate tissues and sutures, instrument exchange and for overall help during this procedure.    Anesthesia  general EBL:  minimal ml Blood Administration: none Xclamp Time:  59 min Pump Time:  161mn  Drains:  Chest tube to the left pleural space and 2 to the anterior and posterior mediastinum  Counts: correct   Indications: 77yo male with NSTEMI and now with multivessel CAD, slightly depressed LV function and moderate AI. Pt would best be served with CABG and all the risks and goals of surgery were discussed and pt wishes to proceed.    Findings: The heart had an EF of 60% and with moderate AI. The vessels were visible on surface of heart with the LAD being 1.5 mm and with mild disease after the stents, and the PDA was a 1.5 mm vessel with mild distal disease and both OM were 1.5 mm vessels with mild disease. The LTowacoand SVG was excellent for grafting and at conclusion there was still normal LV function  Operative Technique: All invasive lines were placed in pre-op holding.  After the risks, benefits and alternatives were thoroughly discussed, the patient was brought to the operative theatre.  Anesthesia was induced, and the patient was prepped and draped in normal sterile fashion.  An appropriate  surgical pause was performed, and pre-operative antibiotics were dosed accordingly.  We began with simultaneous incisions along the right leg for harvesting of the greater saphenous vein and the chest for the sternotomy.  In regards to the sternotomy, this was carried down with bovie cautery, and the sternum was divided with a reciprocating saw.  Meticulous hemostasis was obtained.  The left internal thoracic artery was exposed and harvested in in pedicled fashion.  The patient was systemically heparinized, and the artery was divided distally, and placed in a papaverine sponge.    The sternal elevator was removed, and a retractor was placed.  The pericardium was divided in the midline and fashioned into a cradle with pericardial stitches.   After we confirmed an appropriate ACT, the ascending aorta was cannulated in standard fashion.  The right atrial appendage was used for venous cannulation site.  Antegrade and Retrograde cardioplegia catheters were place in the ascending aorta and coronary sinus respectfully. Cardiopulmonary bypass was initiated, and the heart retractor was placed. The cross clamp was applied, and a dose of anterograde cardioplegia was given with good arrest of the heart followed by an additional 5 min retrograde.  Additional cardioplegia was given retrograde every 20 min after cross clamp and a hot shot given just prior to cross clamp removal.  We moved to the posterior wall of the heart, and found a good target on the PDA.  An arteriotomy was made, and the vein graft was anastomosed to it in an end to side fashion.  Next we exposed the lateral wall, and found a good target on both OM1 and OM2..  An end to side anastomosis with the vein graft was then created to OM2 and this was brought back as a side to side anastamosis to OM1. They were flushed and deaired in routine fashion.  Finally, we exposed a good target on the distal LAD, and fashioned an end to side anastomosis between it and the  LITA.  We began to re-warm, and a re-animation dose of cardioplegia was given.  The heart was de-aired, and the cross clamp was removed.  Meticulous hemostasis was obtained.    A partial occludding clamp was then placed on the ascending aorta, and we created an end to side anastomosis between it and the proximal vein graftto the PDA and both OM veins.  Rings were placed on the proximal anastomosis.  Ventricular and atrial pacing wires were placed and brought out through inferior stab incisions. Hemostasis was obtained, and we separated from cardiopulmonary bypass without event.  The heparin was reversed with protamine.  Chest tubes and wires were placed, and the sternum was re-approximated with sternal wires.  The soft tissue and skin were re-approximated wth absorbable suture.    The patient tolerated the procedure without any immediate complications, and was transferred to the ICU in guarded condition.  Coralie Common MD

## 2022-05-09 ENCOUNTER — Encounter (HOSPITAL_COMMUNITY): Payer: Self-pay | Admitting: Thoracic Surgery (Cardiothoracic Vascular Surgery)

## 2022-05-09 ENCOUNTER — Inpatient Hospital Stay (HOSPITAL_COMMUNITY): Payer: 59

## 2022-05-09 DIAGNOSIS — I214 Non-ST elevation (NSTEMI) myocardial infarction: Secondary | ICD-10-CM | POA: Diagnosis not present

## 2022-05-09 LAB — BASIC METABOLIC PANEL
Anion gap: 7 (ref 5–15)
Anion gap: 9 (ref 5–15)
BUN: 14 mg/dL (ref 8–23)
BUN: 16 mg/dL (ref 8–23)
CO2: 20 mmol/L — ABNORMAL LOW (ref 22–32)
CO2: 24 mmol/L (ref 22–32)
Calcium: 8.1 mg/dL — ABNORMAL LOW (ref 8.9–10.3)
Calcium: 8.4 mg/dL — ABNORMAL LOW (ref 8.9–10.3)
Chloride: 105 mmol/L (ref 98–111)
Chloride: 100 mmol/L (ref 98–111)
Creatinine, Ser: 1.07 mg/dL (ref 0.61–1.24)
Creatinine, Ser: 1.15 mg/dL (ref 0.61–1.24)
GFR, Estimated: >60 mL/min (ref >60)
GFR, Estimated: >60 mL/min (ref >60)
Glucose, Bld: 107 mg/dL — ABNORMAL HIGH (ref 70–99)
Glucose, Bld: 121 mg/dL — ABNORMAL HIGH (ref 70–99)
Potassium: 4.4 mmol/L (ref 3.5–5.1)
Potassium: 4.2 mmol/L (ref 3.5–5.1)
Sodium: 132 mmol/L — ABNORMAL LOW (ref 135–145)
Sodium: 133 mmol/L — ABNORMAL LOW (ref 135–145)

## 2022-05-09 LAB — GLUCOSE, CAPILLARY
Glucose-Capillary: 106 mg/dL — ABNORMAL HIGH (ref 70–99)
Glucose-Capillary: 113 mg/dL — ABNORMAL HIGH (ref 70–99)
Glucose-Capillary: 121 mg/dL — ABNORMAL HIGH (ref 70–99)
Glucose-Capillary: 125 mg/dL — ABNORMAL HIGH (ref 70–99)
Glucose-Capillary: 132 mg/dL — ABNORMAL HIGH (ref 70–99)
Glucose-Capillary: 142 mg/dL — ABNORMAL HIGH (ref 70–99)

## 2022-05-09 LAB — MAGNESIUM
Magnesium: 2.9 mg/dL — ABNORMAL HIGH (ref 1.7–2.4)
Magnesium: 2.5 mg/dL — ABNORMAL HIGH (ref 1.7–2.4)

## 2022-05-09 LAB — CBC
HCT: 29.6 % — ABNORMAL LOW (ref 39.0–52.0)
HCT: 29.1 % — ABNORMAL LOW (ref 39.0–52.0)
Hemoglobin: 9.8 g/dL — ABNORMAL LOW (ref 13.0–17.0)
Hemoglobin: 10 g/dL — ABNORMAL LOW (ref 13.0–17.0)
MCH: 31.7 pg (ref 26.0–34.0)
MCH: 32.3 pg (ref 26.0–34.0)
MCHC: 33.1 g/dL (ref 30.0–36.0)
MCHC: 34.4 g/dL (ref 30.0–36.0)
MCV: 95.8 fL (ref 80.0–100.0)
MCV: 93.9 fL (ref 80.0–100.0)
Platelets: 136 10*3/uL — ABNORMAL LOW (ref 150–400)
Platelets: 145 10*3/uL — ABNORMAL LOW (ref 150–400)
RBC: 3.09 MIL/uL — ABNORMAL LOW (ref 4.22–5.81)
RBC: 3.1 MIL/uL — ABNORMAL LOW (ref 4.22–5.81)
RDW: 15.5 % (ref 11.5–15.5)
RDW: 15.5 % (ref 11.5–15.5)
WBC: 6.2 10*3/uL (ref 4.0–10.5)
WBC: 6.9 10*3/uL (ref 4.0–10.5)
nRBC: 0 % (ref 0.0–0.2)
nRBC: 0 % (ref 0.0–0.2)

## 2022-05-09 MED ORDER — INSULIN ASPART 100 UNIT/ML IJ SOLN
0.0000 [IU] | INTRAMUSCULAR | Status: DC
  Administered 2022-05-09 – 2022-05-11 (×9): 2 [IU] via SUBCUTANEOUS

## 2022-05-09 MED ORDER — AMIODARONE HCL 200 MG PO TABS
200.0000 mg | ORAL_TABLET | Freq: Two times a day (BID) | ORAL | Status: DC
  Administered 2022-05-09 (×2): 200 mg via ORAL
  Filled 2022-05-09 (×2): qty 1

## 2022-05-09 MED ORDER — INSULIN ASPART 100 UNIT/ML IJ SOLN
0.0000 [IU] | INTRAMUSCULAR | Status: AC
  Administered 2022-05-09 (×2): 2 [IU] via SUBCUTANEOUS

## 2022-05-09 MED ORDER — FUROSEMIDE 10 MG/ML IJ SOLN
40.0000 mg | Freq: Once | INTRAMUSCULAR | Status: AC
  Administered 2022-05-09: 40 mg via INTRAVENOUS
  Filled 2022-05-09: qty 4

## 2022-05-09 MED ORDER — ENOXAPARIN SODIUM 40 MG/0.4ML IJ SOSY
40.0000 mg | PREFILLED_SYRINGE | Freq: Every day | INTRAMUSCULAR | Status: DC
  Administered 2022-05-09 – 2022-05-10 (×2): 40 mg via SUBCUTANEOUS
  Filled 2022-05-09 (×2): qty 0.4

## 2022-05-09 NOTE — Discharge Summary (Signed)
[address]       ,[place] [address]             [phone]    Physician Discharge Summary  Patient ID: Kelian Adriance MRN: [id number] DOB/AGE: [birth year] 77 y.o.  Admit date: 05/02/2022 Discharge date: 05/12/2022  Admission Diagnoses:  Patient Active Problem List   Diagnosis Date Noted   Coronary artery disease involving native coronary artery with unstable angina pectoris ([place]) 05/08/2022   S/P CABG x 4 05/08/2022   Non-insulin dependent type 2 diabetes mellitus ([place]) [id number]   Chronic systolic congestive heart failure ([place]) 05/02/2022   NSTEMI (non-ST elevated myocardial infarction) ([place]) 05/02/2022   Hyperlipidemia 05/02/2022   Sepsis ([place]) 08/09/2019   History of GI bleed 11/03/2017   CKD (chronic kidney disease), stage III ([place]) 10/19/2017   Diverticulitis 10/19/2017   Gastro-esophageal reflux disease without esophagitis 06/04/2015   Hearing loss 01/04/2015   Chest pain    Low testosterone 06/02/2014   Irritable bowel syndrome 11/16/2013   Unstable angina ([facility]) 06/11/2012   CAD S/P percutaneous coronary angioplasty 01/07/2011    Class: Chronic   Angina pectoris 01/07/2011    Class: Acute   Hyperlipidemia with target low density lipoprotein (LDL) cholesterol less than 70 mg/dL 01/07/2011    Class: Chronic   Hypertension 01/07/2011    Class: Chronic   Ischemic cardiomyopathy 01/07/2011    Class: Chronic   Previous myocardial infarction older than 8 weeks 01/07/2011    Class: History of     Discharge Diagnoses:  Patient Active Problem List   Diagnosis Date Noted   Coronary artery disease involving native coronary artery with unstable angina pectoris ([place]) 05/08/2022   S/P CABG x 4 05/08/2022   Non-insulin dependent type 2 diabetes mellitus ([place]) [id number]   Chronic systolic congestive heart failure ([place]) 05/02/2022   NSTEMI (non-ST elevated myocardial infarction) ([place]) 05/02/2022   Hyperlipidemia 05/02/2022   Sepsis ([place])  08/09/2019   History of GI bleed 11/03/2017   CKD (chronic kidney disease), stage III ([place]) 10/19/2017   Diverticulitis 10/19/2017   Gastro-esophageal reflux disease without esophagitis 06/04/2015   Hearing loss 01/04/2015   Chest pain    Low testosterone 06/02/2014   Irritable bowel syndrome 11/16/2013   Unstable angina ([place]) 06/11/2012   CAD S/P percutaneous coronary angioplasty 01/07/2011    Class: Chronic   Angina pectoris 01/07/2011    Class: Acute   Hyperlipidemia with target low density lipoprotein (LDL) cholesterol less than 70 mg/dL 01/07/2011    Class: Chronic   Hypertension 01/07/2011    Class: Chronic   Ischemic cardiomyopathy 01/07/2011    Class: Chronic   Previous myocardial infarction older than 8 weeks 01/07/2011    Class: History of     Discharged Condition: good  Reason for Consult:3 vessel CAD, s/p nonSTEMI Referring Physician: Dr. Audie Box    HPI: At time of CT surgical evaluation 77 yo with known CAD presents with unstable CP.   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   He was having CP for about a week prior to admission, but thought it was reflux.  Was working in his yard yesterday when he had severe squeezing substernal pain.  In ED initial troponin elevated.  Started on IV heparin.  Second troponin 5099,  Had ongoing pain and taken to cath lab.  Found to have patent stents but  severe 3 vessel CAD.  Currently pain free.  The patient and all relevant studies were reviewed by Dr. Lavonna Monarch who recommended proceeding with CABG.  Hospital course: Following medical stabilization and full diagnostic workup patient was felt to be stable to proceed and on 05/08/2022 he was taken to the operating room at which time he underwent CABG x 4.  He tolerated procedure well was taken to the surgical intensive care unit in stable  condition.  Postoperative hospital course:  Patient was extubated using standard post cardiac surgical protocols without difficulty on the day of surgery.  He has remained hemodynamically stable.  Swan-Ganz and arterial line were discontinued on postop day #1.  Renal function has remained within normal limits.  He does show some expected postoperative volume overload and was started on a course of diuretics.  He does have a mild expected acute blood loss anemia which is stabilized.  Most recent hemoglobin and hematocrit on 03/17 was 8.8 and 26.4. He also had  a mild reactive postoperative thrombocytopenia but it has trended better over time and ultimately normalized.  He was started on a course of routine pulmonary hygiene and cardiac rehab modalities. He was transitioned off the Insulin drip. His pre op HGA1C was 7. He will need to follow up with his medical doctor after discharge. He was started on Plavix and ec asa was decreased to 81 (s/p NSTEMI).  He was felt he would not require aspirin at discharge, just Plavix.  He is felt surgically stable for transfer from the ICU to 4E for further convalescence. He is ambulating on room air with good oxygenation. All wounds are clean, dry, healing without signs of infection. He has been tolerating a diet and has had a bowel movement.  Oxygen was weaned and he maintains good saturations on room air.  Overall, at the time of discharge she was felt to be quite stable. to be quite stable.     Consults: pulmonary/intensive care  Significant Diagnostic Studies:  DG Chest Port 1 View  Result Date: 05/10/2022 CLINICAL DATA:  [id number] S/P CABG x 3 [id number] EXAM: PORTABLE CHEST 1 VIEW COMPARISON:  Radiograph 05/09/2022 FINDINGS: Intact sternotomy wires. Postsurgical changes of CABG. Mediastinal drain, left chest tube, and pulmonary artery catheter has been removed. Unchanged mildly enlarged cardiac silhouette. Mild left basilar subsegmental atelectasis, slightly improved. No new airspace  disease. No large effusion or evidence of pneumothorax. IMPRESSION: Left basilar subsegmental atelectasis, slightly improved. No new airspace disease or large effusion. Electronically Signed   By: Maurine Simmering M.D.   On: 05/10/2022 13:48   DG Chest Port 1 View  Result Date: 05/09/2022 CLINICAL DATA:  Post CABG EXAM: PORTABLE CHEST - 1 VIEW COMPARISON:  the previous day's study FINDINGS: Patient has been extubated and the nasogastric tube removed. Stable left chest tube and mediastinal drain. No pneumothorax. Stable right IJ Swan-Ganz to the proximal right PA. Low lung volumes with patchy perihilar and bibasilar opacities, left greater than right as before. Heart size and mediastinal contours are within normal limits. CABG markers. Blunting of the left lateral costophrenic angle as before. Sternotomy wires. IMPRESSION: 1. Extubation with persistent perihilar and bibasilar opacities, left greater than right. 2. Stable support hardware. Electronically Signed   By: Lucrezia Europe M.D.   On: 05/09/2022 07:51   ECHO INTRAOPERATIVE TEE  Result Date: 05/08/2022  *INTRAOPERATIVE TRANSESOPHAGEAL REPORT *  Patient Name:   JAMORRIS ORTON Date of Exam: 05/08/2022 Medical Rec #:  [id number]      Height:  67.0 in Accession #:    [id number]     Weight:       185.8 lb Date of Birth:  01-Oct-1945      BSA:          1.96 m Patient Age:    17 years       BP:           122/54 mmHg Patient Gender: M              HR:           47 bpm. Exam Location:  Anesthesiology Transesophogeal exam was perform intraoperatively during surgical procedure. Patient was closely monitored under general anesthesia during the entirety of examination. Indications:     CABG Performing Phys: [id number] Coralie Common Complications: No known complications during this procedure. POST-OP IMPRESSIONS _ Left Ventricle: The left ventricle is unchanged from pre-bypass. _ Right Ventricle: The right ventricle appears unchanged from pre-bypass. _ Aorta: The aorta appears  unchanged from pre-bypass. _ Left Atrium: The left atrium appears unchanged from pre-bypass. _ Left Atrial Appendage: The left atrial appendage appears unchanged from pre-bypass. _ Aortic Valve: The aortic valve appears unchanged from pre-bypass. _ Mitral Valve: The mitral valve appears unchanged from pre-bypass. _ Tricuspid Valve: The tricuspid valve appears unchanged from pre-bypass. _ Pulmonic Valve: The pulmonic valve appears unchanged from pre-bypass. _ Interatrial Septum: The interatrial septum appears unchanged from pre-bypass. _ Interventricular Septum: The interventricular septum appears unchanged from pre-bypass. _ Pericardium: The pericardium appears unchanged from pre-bypass. _ Comments: Good LV function post bypass No RWMA, valves as before. PRE-OP FINDINGS  Left Ventricle: The left ventricle has normal systolic function, with an ejection fraction of 55-60%. The cavity size was normal. There is no increase in left ventricular wall thickness. No evidence of left ventricular regional wall motion abnormalities. There is borderline concentric left ventricular hypertrophy of the inferior and anterior segments. Left ventricular diastolic parameters are consistent with Grade I diastolic dysfunction (impaired relaxation). Right Ventricle: The right ventricle has normal systolic function. The cavity was normal. There is no increase in right ventricular wall thickness. Left Atrium: Left atrial size was normal in size. No left atrial/left atrial appendage thrombus was detected. The left atrial appendage is well visualized and there is no evidence of thrombus present. Right Atrium: Right atrial size was normal in size. Interatrial Septum: No atrial level shunt detected by color flow Doppler. Agitated saline contrast was given intravenously to evaluate for intracardiac shunting. There is no evidence of a patent foramen ovale. Pericardium: There is no evidence of pericardial effusion. 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 Mitral valve regurgitation is mild by color flow Doppler. The MR jet is centrally-directed. 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 There is moderate thickening and moderate calcification present on the mitral valve P3 cusp with mildly decreased mobility and there is moderate thickening and moderate calcification present on the mitral valve A1 cusp with normal mobility. Tricuspid Valve: The tricuspid valve was normal in structure. Tricuspid valve regurgitation was not visualized by color flow Doppler. No evidence of tricuspid stenosis is present. Aortic Valve: The aortic valve is normal in structure. Aortic valve regurgitation is mild by color flow Doppler. The jet is centrally-directed. There is no stenosis of the aortic valve. There is mild thickening and mild calcification present on the aortic valve right coronary cusp with normal mobility and there is mild thickening and mild calcification present on the aortic valve left coronary cusp with normal mobility and there  is moderate thickening and moderate calcification present on the aortic valve non-coronary cusp with normal mobility. Pulmonic Valve: The pulmonic valve was normal in structure, with normal. Pulmonic valve regurgitation is mild by color flow Doppler. Aorta: The aortic root and ascending aorta are normal in size and structure. There is borderline dilatation of the aortic root, measuring 37 mm. Shunts: There is no evidence of an atrial septal defect. +--------------+--------++ LEFT VENTRICLE         +--------------+--------++ PLAX 2D                +--------------+--------++ LVIDd:        5.30 cm  +--------------+--------++ LVIDs:        3.50 cm  +--------------+--------++ LVOT diam:    2.30 cm  +--------------+--------++ LV SV:        84 ml    +--------------+--------++ LV SV Index:  41.85    +--------------+--------++ LVOT Area:    4.15 cm +--------------+--------++                         +--------------+--------++ +------------+--------++ AORTIC VALVE         +------------+--------++ AR PHT:     799 msec +------------+--------++  +-------------+-------++ AORTA                +-------------+-------++ Ao Root diam:3.70 cm +-------------+-------++  +--------------+-------+ SHUNTS                +--------------+-------+ Systemic Diam:2.30 cm +--------------+-------+  Myrtie Soman MD Electronically signed by Myrtie Soman MD Signature Date/Time: 05/08/2022/4:47:15 PM    Final    DG Chest Port 1 View  Result Date: 05/08/2022 CLINICAL DATA:  Status post CABG EXAM: PORTABLE CHEST 1 VIEW COMPARISON:  Chest radiograph dated May 02, 2022 FINDINGS: The heart is enlarged. Swans Ganz catheter with distal tip in the main pulmonary trunk. Endotracheal tube with distal tip a proximally 3 cm above the carina. Feeding tube coursing below the diaphragm with distal tip projecting over the stomach. Surgical drains in the left hemithorax. Left basilar atelectasis. No appreciable pneumothorax. Sternotomy wires. No acute osseous abnormality. IMPRESSION: 1. Lines and tubes as above. 2. Cardiomegaly. 3. Left basilar atelectasis. No appreciable pneumothorax. Electronically Signed   By: Keane Police D.O.   On: 05/08/2022 12:40   EP STUDY  Result Date: 05/08/2022 See surgical note for result.  VAS US DOPPLER PRE CABG  Result Date: 05/07/2022 PREOPERATIVE VASCULAR EVALUATION Patient Name:  HANZ JECK  Date of Exam:   05/06/2022 Medical Rec #: [id number]       Accession #:    [id number] Date of Birth: [birth year]       Patient Gender: M Patient Age:   8 years Exam Location:  [facility] Procedure:      VAS US DOPPLER PRE CABG Referring Phys: Remo Lipps HENDRICKSON --------------------------------------------------------------------------------  Indications:  Pre-CABG. Risk Factors: Hypertension, hyperlipidemia, past history of smoking, prior MI,               coronary artery disease. Performing  Technologist: Darlin Coco RDMS, RVT  Examination Guidelines: A complete evaluation includes B-mode imaging, spectral Doppler, color Doppler, and power Doppler as needed of all accessible portions of each vessel. Bilateral testing is considered an integral part of a complete examination. Limited examinations for reoccurring indications may be performed as noted.  Right Carotid Findings: +----------+--------+--------+--------+------------+--------+           PSV cm/sEDV cm/sStenosisDescribe    Comments +----------+--------+--------+--------+------------+--------+ CCA Prox  78      11                                   +----------+--------+--------+--------+------------+--------+ CCA Distal94      11                                   +----------+--------+--------+--------+------------+--------+ ICA Prox  91      19      1-39%   heterogenous         +----------+--------+--------+--------+------------+--------+ ICA Mid   95      21                                   +----------+--------+--------+--------+------------+--------+ ICA Distal64      19                                   +----------+--------+--------+--------+------------+--------+ ECA       117                                          +----------+--------+--------+--------+------------+--------+ +----------+--------+-------+----------------+------------+           PSV cm/sEDV cmsDescribe        Arm Pressure +----------+--------+-------+----------------+------------+ [id number]            Multiphasic, WNL             +----------+--------+-------+----------------+------------+ +---------+--------+--+--------+-+---------+ VertebralPSV cm/s25EDV cm/s7Antegrade +---------+--------+--+--------+-+---------+ Left Carotid Findings: +----------+--------+--------+--------+------------+--------+           PSV cm/sEDV cm/sStenosisDescribe    Comments  +----------+--------+--------+--------+------------+--------+ CCA Prox  113     10                                   +----------+--------+--------+--------+------------+--------+ CCA Distal130     13                                   +----------+--------+--------+--------+------------+--------+ ICA Prox  104     16      1-39%   heterogenous         +----------+--------+--------+--------+------------+--------+ ICA Mid   65      14                                   +----------+--------+--------+--------+------------+--------+ ICA Distal61      15                                   +----------+--------+--------+--------+------------+--------+ ECA       101                                          +----------+--------+--------+--------+------------+--------+ +----------+--------+--------+----------------+------------+ SubclavianPSV cm/sEDV cm/sDescribe        Arm Pressure +----------+--------+--------+----------------+------------+  214             Multiphasic, WNL             +----------+--------+--------+----------------+------------+ +---------+--------+--+--------+-+---------+ VertebralPSV cm/s48EDV cm/s7Antegrade +---------+--------+--+--------+-+---------+  ABI Findings: +--------+------------------+-----+---------+--------+ Right   Rt Pressure (mmHg)IndexWaveform Comment  +--------+------------------+-----+---------+--------+ Brachial                       triphasic         +--------+------------------+-----+---------+--------+ PTA                            triphasic         +--------+------------------+-----+---------+--------+ DP                             triphasic         +--------+------------------+-----+---------+--------+ +--------+------------------+-----+---------+-------+ Left    Lt Pressure (mmHg)IndexWaveform Comment +--------+------------------+-----+---------+-------+ Brachial                        triphasic        +--------+------------------+-----+---------+-------+ PTA                            triphasic        +--------+------------------+-----+---------+-------+ DP                             triphasic        +--------+------------------+-----+---------+-------+  Right Doppler Findings: +--------+--------+-----+---------+--------+ Site    PressureIndexDoppler  Comments +--------+--------+-----+---------+--------+ Brachial             triphasic         +--------+--------+-----+---------+--------+ Radial               triphasic         +--------+--------+-----+---------+--------+ Ulnar                triphasic         +--------+--------+-----+---------+--------+  Left Doppler Findings: +--------+--------+-----+---------+--------+ Site    PressureIndexDoppler  Comments +--------+--------+-----+---------+--------+ Brachial             triphasic         +--------+--------+-----+---------+--------+ Radial               triphasic         +--------+--------+-----+---------+--------+ Ulnar                triphasic         +--------+--------+-----+---------+--------+   Summary: Right Carotid: Velocities in the right ICA are consistent with a 1-39% stenosis. Left Carotid: Velocities in the left ICA are consistent with a 1-39% stenosis. Vertebrals:  Bilateral vertebral arteries demonstrate antegrade flow. Subclavians: Normal flow hemodynamics were seen in bilateral subclavian              arteries. Right ABI: Triphasic PTA and DPA waveforms. Left ABI: Triphasic PTA and DPA waveforms.  Right Upper Extremity: Doppler waveforms remain within normal limits with right radial compression. Doppler waveforms remain within normal limits with right ulnar compression. Left Upper Extremity: Doppler waveforms remain within normal limits with left radial compression. Doppler waveforms remain within normal limits with left ulnar compression.  Electronically signed by  Monica Martinez MD on 05/07/2022 at 9:48:13 AM.    Final    ECHOCARDIOGRAM COMPLETE  Result Date: 05/03/2022    ECHOCARDIOGRAM REPORT  Patient Name:   DANIELS THOMA Date of Exam: 05/03/2022 Medical Rec #:  [id number]      Height:       67.0 in Accession #:    [id number]     Weight:       180.6 lb Date of Birth:  [birth year]      BSA:          1.936 m Patient Age:    55 years       BP:           147/66 mmHg Patient Gender: M              HR:           72 bpm. Exam Location:  Inpatient Procedure: 2D Echo, Cardiac Doppler, Color Doppler and Intracardiac            Opacification Agent Indications:    CAD Native Vessel I25.10  History:        Patient has prior history of Echocardiogram examinations, most                 recent 01/20/2022. Cardiomyopathy, NSTEMI, Previous Myocardial                 Infarction and CAD; Risk Factors:Hypertension and Former Smoker.  Sonographer:    Greer Pickerel Referring Phys: 34 DAVID W HARDING  Sonographer Comments: Suboptimal parasternal window. Image acquisition challenging due to patient body habitus and Image acquisition challenging due to respiratory motion. IMPRESSIONS  1. 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 There is no visualized apical thrombus. . Left ventricular ejection fraction, by estimation, is 50%. The left ventricle has normal function. Left ventricular endocardial border not optimally defined to evaluate regional wall motion. Left ventricular diastolic parameters are consistent with Grade I diastolic dysfunction (impaired relaxation).  2. Right ventricular systolic function is normal. The right ventricular size is normal. Tricuspid regurgitation signal is inadequate for assessing PA pressure.  3. The mitral valve is normal in structure. Trivial mitral valve regurgitation. Mild to moderate mitral stenosis.  4. The aortic valve has an indeterminant number of cusps. Aortic valve regurgitation is moderate. No aortic stenosis is present.  5. Aortic dilatation noted.  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  6. The inferior vena cava is normal in size with <50% respiratory variability, suggesting right atrial pressure of 8 mmHg. 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 There is no visualized apical thrombus. Left ventricular ejection fraction, by estimation, is 50%. The left ventricle has normal function. Left ventricular endocardial border not optimally defined to evaluate regional wall motion. Definity contrast agent was given IV to delineate the left ventricular endocardial borders. The left ventricular internal cavity size was normal in size. There is no left ventricular hypertrophy. Left ventricular  diastolic parameters are consistent with Grade I diastolic dysfunction (impaired relaxation). Normal left ventricular filling pressure. Right Ventricle: The right ventricular size is normal. Right vetricular wall thickness was not well visualized. Right ventricular systolic function is normal. Tricuspid regurgitation signal is inadequate for assessing PA pressure. Left Atrium: Left atrial size was normal in size. Right Atrium: Right atrial size was normal in size. Pericardium: There is no evidence of pericardial effusion. Mitral Valve: The mitral valve is normal in structure. There is mild thickening of the mitral valve leaflet(s). There is mild calcification of the mitral valve leaflet(s). Mild mitral annular calcification. Trivial mitral valve regurgitation. Mild to moderate mitral  valve stenosis. MV peak gradient, 13.8 mmHg. The mean mitral valve gradient is 6.0 mmHg. Tricuspid Valve: The tricuspid valve is normal in structure. Tricuspid valve regurgitation is not demonstrated. No evidence of tricuspid stenosis. Aortic Valve: The aortic valve has an indeterminant number of cusps. Aortic valve regurgitation is moderate. Aortic regurgitation PHT measures 759 msec. No aortic stenosis is present. Aortic valve mean gradient  measures 4.9 mmHg. Aortic valve peak gradient measures 11.3 mmHg. Aortic valve area, by VTI measures 2.19 cm. Pulmonic Valve: The pulmonic valve was not well visualized. Pulmonic valve regurgitation is not visualized. No evidence of pulmonic stenosis. Aorta: The aortic root is normal in size and structure and aortic dilatation noted. 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 Venous: The inferior vena cava is normal in size with less than 50% respiratory variability, suggesting right atrial pressure of 8 mmHg. IAS/Shunts: No atrial level shunt detected by color flow Doppler.  LEFT VENTRICLE PLAX 2D LVIDd:         3.90 cm   Diastology LVIDs:         2.70 cm   LV e' medial:    4.90 cm/s LV PW:         1.00 cm   LV E/e' medial:  19.7 LV IVS:        0.90 cm   LV e' lateral:   7.29 cm/s LVOT diam:     1.90 cm   LV E/e' lateral: 13.3 LV SV:         65 LV SV Index:   34 LVOT Area:     2.84 cm  RIGHT VENTRICLE RV S prime:     9.60 cm/s TAPSE (M-mode): 2.2 cm LEFT ATRIUM             Index        RIGHT ATRIUM           Index LA diam:        3.20 cm 1.65 cm/m   RA Area:     18.55 cm LA Vol (A2C):   72.3 ml 37.34 ml/m  RA Volume:   52.20 ml  26.96 ml/m LA Vol (A4C):   53.0 ml 27.37 ml/m LA Biplane Vol: 62.9 ml 32.49 ml/m  AORTIC VALVE                     PULMONIC VALVE AV Area (Vmax):    1.83 cm      PR End Diast Vel: 3.11 msec AV Area (Vmean):   2.07 cm AV Area (VTI):     2.19 cm AV Vmax:           168.44 cm/s AV Vmean:          100.239 cm/s AV VTI:            0.296 m AV Peak Grad:      11.3 mmHg AV Mean Grad:      4.9 mmHg LVOT Vmax:         109.00 cm/s LVOT Vmean:        73.200 cm/s LVOT VTI:          0.229 m LVOT/AV VTI ratio: 0.77 AI PHT:            759 msec  AORTA Ao Root diam: 3.80 cm Ao Asc diam:  3.70 cm MITRAL VALVE MV Area (PHT): 3.39 cm     SHUNTS MV Area VTI:   1.11 cm  Systemic VTI:  0.23 m MV Peak grad:  13.8 mmHg    Systemic Diam: 1.90 cm MV Mean grad:  6.0 mmHg MV Vmax:        1.86 m/s MV Vmean:      109.0 cm/s MV Decel Time: 224 msec MV E velocity: 96.60 cm/s MV A velocity: 150.00 cm/s MV E/A ratio:  0.64 Carlyle Dolly MD Electronically signed by Carlyle Dolly MD Signature Date/Time: 05/03/2022/2:30:26 PM    Final    CARDIAC CATHETERIZATION  Result Date: 05/03/2022   Prox LAD lesion is 75% stenosed - just prior to stent   Previously placed Mid LAD to Dist LAD stent of unknown type is  widely patent.   Prox Cx to Mid Cx lesion is 65% stenosed -just proximal to prior stent   Previously placed Prox RCA to Mid RCA stent of unknown type is  widely patent.   Previously placed Mid Cx to Dist Cx stent is 5% stenosed with 90% stenosed side branch in 2nd Mrg.   Mid RCA lesion is 80% stenosed - just distal to stent.  Mid RCA to Dist RCA lesion is 65% stenosed.  Dist RCA lesion is 80% stenosed.   -----------------------------------------------   There is moderate left ventricular systolic dysfunction.  The left ventricular ejection fraction is 35-45% by visual estimate.   LV end diastolic pressure is severely elevated.   There is no aortic valve stenosis. 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 Recommend 2D echo to better assess) PLAN Unsure what the best option here is.  Clearly all the lesion segments are treatable with PCI with exception of potentially the OM branch which is jailed.  Complete revascularization with CABG is also an option.  At this point, the best plan is to temporize medically and discussed with interventional colleagues as well as CVTS consultation (will need to be called in AM by rounding MD). Treat with a  short course of Aggrastat (6 hours) for potential thrombotic disease in OM2. RCA and LAD given his ongoing pain. Restart heparin 6 to 8 hours after sheath removal which would be after Aggrastat completion. Hold clopidogrel Check 2D echo to better assess EF Continue IV nitroglycerin infusion and will hold Entresto for now defer to rounding team based on blood pressure to determine whether will be restarted.  We will continue carvedilol. Glenetta Hew  DG Chest 2 View  Result Date: 05/02/2022 CLINICAL DATA:  Chest pain. EXAM: CHEST - 2 VIEW COMPARISON:  August 08, 2019. FINDINGS: The heart size and mediastinal contours are within normal limits. Both lungs are clear. The visualized skeletal structures are unremarkable. IMPRESSION: No active cardiopulmonary disease. Electronically Signed   By: Marijo Conception M.D.   On: 05/02/2022 15:39      Results for orders placed or performed during the hospital encounter of 05/02/22 (from the past 48 hour(s))  Glucose, capillary     Status: Abnormal   Collection Time: 05/11/22 11:13 AM  Result Value Ref Range   Glucose-Capillary 112 (H) 70 - 99 mg/dL    Comment: Glucose reference range applies only to samples taken after fasting for at least 8 hours.  Glucose, capillary     Status: Abnormal   Collection Time: 05/11/22  4:52 PM  Result Value Ref Range   Glucose-Capillary 118 (H) 70 - 99 mg/dL    Comment: Glucose reference range applies only to samples taken after fasting for at least 8 hours.  Glucose, capillary     Status: Abnormal   Collection Time: 05/11/22  8:30 PM  Result Value Ref Range   Glucose-Capillary 138 (H) 70 - 99 mg/dL    Comment: Glucose reference range applies only to samples taken after fasting for at least 8 hours.  Glucose, capillary     Status: Abnormal   Collection Time: 05/12/22  6:59 AM  Result Value Ref Range   Glucose-Capillary 102 (H) 70 - 99 mg/dL    Comment: Glucose reference range applies only to samples taken after fasting for  at least 8 hours.  Glucose, capillary     Status: Abnormal   Collection Time: 05/12/22 11:12 AM  Result Value Ref Range   Glucose-Capillary 120 (H) 70 - 99 mg/dL    Comment: Glucose reference range applies only to samples taken after fasting for at least 8 hours.    Treatments: surgery:      05/08/2022 Patient:  [name] Pre-Op Dx: Coronary artery Disease and NSTEMI   Post-op Dx:  Same Procedure: CABG X 4 with the LIMA to the LAD and RSVG from the aorta to the PDA and from the aorta to the OM1 sequenced to OM2   Endoscopic greater saphenous vein harvest on the right     Surgeon and Role:      Coralie Common, MD- Primary    * Jadene Pierini , PA-C - assisting Anesthesia  general EBL:  minimal ml Blood Administration: none Xclamp Time:  59 min Pump Time:  193min   Discharge Exam: Blood pressure 118/72, pulse 76, temperature 98.8 F (37.1 C), temperature source Oral, resp. rate 19, height 5\' 7"  (1.702 m), weight 83.5 kg, SpO2 95 %.   General appearance: alert, cooperative, and no distress Heart: regular rate and rhythm Lungs: clear to auscultation bilaterally Abdomen: benign, + BS Extremities: minor edema Wound: incis healing well  Discharge Medications:  The patient has been discharged on:   1.Beta Blocker:  Yes [ y  ]                              No   [   ]                              If No, reason:  2.Ace Inhibitor/ARB: Yes [   ]                                     No  [  n  ]                                     If No, reason:labile BP  3.Statin:   Yes [   ]                  No  [   ]  If No, reason:intol, is on Zetia  4.Shela CommonsVelta Addison  [   ]                  No   Florencio.[email or web address]   ]                  If No, reason:  Patient had ACS upon admission:y  Plavix/P2Y12 inhibitor: Yes [ y  ]                                      No  [   ]     Discharge Instructions     AMB Referral to Advanced Lipid Disorders Clinic   Complete by: As directed     Internal Lipid Clinic Referral Scheduling  Internal lipid clinic referrals are providers within [facility], who wish to refer established patients for routine management (help in starting PCSK9 inhibitor therapy) or advanced therapies.  Internal MD referral criteria:              1. All patients with LDL>190 mg/dL  2. All patients with Triglycerides >500 mg/dL  3. Patients with suspected or confirmed heterozygous familial hyperlipidemia (HeFH) or homozygous familial hyperlipidemia (HoFH)  4. Patients with family history of suspicious for genetic dyslipidemia desiring genetic testing  5. Patients refractory to standard guideline based therapy  6. Patients with statin intolerance (failed 2 statins, one of which must be a high potency statin)  7. Patients who the provider desires to be seen by MD   Internal PharmD referral criteria:   1. Follow-up patients for medication management  2. Follow-up for compliance monitoring  3. Patients for drug education  4. Patients with statin intolerance  5. PCSK9 inhibitor education and prior authorization approvals  6. Patients with triglycerides <500 mg/dL  External Lipid Clinic Referral  External lipid clinic referrals are for providers outside of [facility], considered new clinic patients - automatically routed to MD schedule   Amb Referral to Cardiac Rehabilitation   Complete by: As directed    Diagnosis: CABG   CABG X ___: 4   After initial evaluation and assessments completed: Virtual Based Care may be provided alone or in conjunction with Phase 2 Cardiac Rehab based on patient barriers.: Yes   Intensive Cardiac Rehabilitation (ICR) [place] location only OR Traditional Cardiac Rehabilitation (TCR) *If criteria for ICR are not met will enroll in TCR [facility] only): Yes   Discharge patient   Complete by: As directed    Discharge disposition: 01-Home or Self Care   Discharge patient date: 05/12/2022      Allergies as of 05/12/2022       Reactions    Asa [aspirin] Other (See Comments)   Abdominal bleeding   Penicillins Anaphylaxis   Has patient had a PCN reaction causing immediate rash, facial/tongue/throat swelling, SOB or lightheadedness with hypotension: Yes Has patient had a PCN reaction causing severe rash involving mucus membranes or skin necrosis: No Has patient had a PCN reaction that required hospitalization: No Has patient had a PCN reaction occurring within the last 10 years: No If all of the above answers are "NO", then may proceed with Cephalosporin use.   Statins Other (See Comments)   Stiff joints, muscle tightness, couldn't walk        Medication List     STOP taking these medications    Entresto 97-103 MG Generic drug: sacubitril-valsartan  nitroGLYCERIN 0.4 MG SL tablet Commonly known as: NITROSTAT       TAKE these medications    acetaminophen 500 MG tablet Commonly known as: TYLENOL Take 1,000 mg by mouth as needed for moderate pain.   carvedilol 3.125 MG tablet Commonly known as: COREG Take 1 tablet (3.125 mg total) by mouth 2 (two) times daily with a meal. What changed: when to take this   clopidogrel 75 MG tablet Commonly known as: PLAVIX Take 1 tablet (75 mg total) by mouth daily.   ezetimibe 10 MG tablet Commonly known as: ZETIA Take 1 tablet (10 mg total) by mouth daily.   Fe Fum-Vit C-Vit B12-FA Caps capsule Commonly known as: TRIGELS-F FORTE Take 1 capsule by mouth daily after breakfast.   LORazepam 0.5 MG tablet Commonly known as: ATIVAN Take 0.5 mg by mouth as needed for anxiety.   METAMUCIL FIBER PO Take 1 Scoop by mouth as needed (constipation).   omega-3 acid ethyl esters 1 g capsule Commonly known as: LOVAZA Take 1 g by mouth 2 (two) times daily.   OVER THE COUNTER MEDICATION Take 1 tablet by mouth in the morning and at bedtime. Beet Root chew   oxyCODONE 5 MG immediate release tablet Commonly known as: Oxy IR/ROXICODONE Take 1 tablet (5 mg total) by mouth every  6 (six) hours as needed for up to 7 days for severe pain.   pantoprazole 40 MG tablet Commonly known as: PROTONIX Take 1 tablet (40 mg total) by mouth daily.   zolpidem 10 MG tablet Commonly known as: AMBIEN Take 1 tablet (10 mg total) by mouth at bedtime as needed. For sleep What changed:  how much to take reasons to take this additional instructions        Follow-up Information     [facility] Follow up.   Specialty: Cardiothoracic Surgery Why: Please see discharge paperwork for details of follow-up appointment with surgeons office. Contact information: [place], [place] Follow up.   Why: On your appointment date at surgeons office please obtain a chest x-ray at [facility] 1 hour prior to the appointment. Contact information: [place] [id number]        Geralynn Rile, MD Follow up.   Specialties: Cardiology, Internal Medicine, Radiology Why: Please see discharge paperwork for details of follow-up appointments with cardiology.  Your first appointment may be with an advanced practice provider.  Please also obtain a MyChart Account to keep track of office appointments. Contact information: [place] [address] [place] Follow up.   Why: rolling walker and bedside commode arranged- to be delivered to room prior to discharge Contact information: [place] [address] [phone]                 Signed:  John Giovanni, PA-C  05/13/2022, 8:39 AM

## 2022-05-09 NOTE — Progress Notes (Signed)
[address]       [place] [address]             [phone]      1 Day Post-Op  Procedure(s) (LRB): CORONARY ARTERY BYPASS GRAFTING (CABG) x4, USING LEFT INTERNAL MAMMARY ARTERY AND ENDOSCOPICALLY HARVESTED RIGHT GREATER SAPHENOUS VEIN (N/A) TRANSESOPHAGEAL ECHOCARDIOGRAM (N/A)  Total Length of Stay:  LOS: 7 days   SUBJECTIVE: Feels well No issues overnight  Vitals:   05/09/22 0645 05/09/22 0700  BP:  109/67  Pulse: 70 70  Resp: 18 19  Temp: 99.7 F (37.6 C) 99.7 F (37.6 C)  SpO2: 94% 93%    Intake/Output      03/14 0701 03/15 0700 03/15 0701 03/16 0700   P.O. 240    I.V. (mL/kg) 980.7 (11.3)    Blood 450    IV Piggyback 1639.9    Total Intake(mL/kg) 3310.6 (38)    Urine (mL/kg/hr) 4730 (2.3)    Stool     Blood 810    Chest Tube 500    Total Output 6040    Net -2729.4             sodium chloride     sodium chloride     sodium chloride 20 mL/hr at 05/09/22 0500   dexmedetomidine (PRECEDEX) IV infusion Stopped (05/08/22 1416)   lactated ringers     lactated ringers     lactated ringers 20 mL/hr at 05/09/22 0500   levofloxacin (LEVAQUIN) IV     nitroGLYCERIN     phenylephrine (NEO-SYNEPHRINE) Adult infusion Stopped (05/08/22 1420)    CBC    Component Value Date/Time   WBC 6.2 05/09/2022 0510   RBC 3.09 (L) 05/09/2022 0510   HGB 9.8 (L) 05/09/2022 0510   HGB 15.3 06/16/2019 1102   HCT 29.6 (L) 05/09/2022 0510   HCT 45.1 06/16/2019 1102   PLT 136 (L) 05/09/2022 0510   PLT 238 06/16/2019 1102   MCV 95.8 05/09/2022 0510   MCV 94 06/16/2019 1102   MCH 31.7 05/09/2022 0510   MCHC 33.1 05/09/2022 0510   RDW 15.5 05/09/2022 0510   RDW 13.8 06/16/2019 1102   LYMPHSABS 1.6 05/02/2022 1514   MONOABS 0.4 05/02/2022 1514   EOSABS 0.2 05/02/2022 1514   BASOSABS 0.0 05/02/2022 1514   CMP     Component Value Date/Time   NA 132 (L) 05/09/2022 0510   NA 139 06/16/2019 1102   K 4.4 05/09/2022 0510   CL 105 05/09/2022 0510    CO2 20 (L) 05/09/2022 0510   GLUCOSE 107 (H) 05/09/2022 0510   BUN 14 05/09/2022 0510   BUN 17 06/16/2019 1102   CREATININE 1.07 05/09/2022 0510   CALCIUM 8.1 (L) 05/09/2022 0510   PROT 6.3 (L) 05/02/2022 1514   PROT 7.3 06/24/2019 1333   ALBUMIN 3.6 05/02/2022 1514   ALBUMIN 4.7 06/24/2019 1333   AST 39 05/02/2022 1514   ALT 43 05/02/2022 1514   ALKPHOS 40 05/02/2022 1514   BILITOT 0.8 05/02/2022 1514   BILITOT 0.5 06/24/2019 1333   GFRNONAA >60 05/09/2022 0510   GFRAA >60 08/11/2019 0445   ABG    Component Value Date/Time   PHART 7.360 05/08/2022 1726   PCO2ART 37.2 05/08/2022 1726   PO2ART 148 (H) 05/08/2022 1726   HCO3 21.0 05/08/2022 1726   TCO2 22 05/08/2022 1726   ACIDBASEDEF 4.0 (H) 05/08/2022 1726   O2SAT 99 05/08/2022 1726   CBG (last 3)  Recent  Labs    05/08/22 2308 05/09/22 0109 05/09/22 0307  GLUCAP 122* 113* 106*  EXAM Lungs: clear Card: RR Ext: warm Neuro: intact   ASSESSMENT: POD #1 SP CABG Hemodynamics good Will dc swan and aline Monitor chest tube drainage and perhaps remove later today Leave in unit Lasix this am   Coralie Common, MD 05/09/2022

## 2022-05-09 NOTE — Progress Notes (Signed)
NAME:  [name], MRN:  [id number], DOB:  Jul 17, 1945, LOS: 7 ADMISSION DATE:  05/02/2022, CONSULTATION DATE:  05/08/2022 REFERRING MD:  Dr. Lavonna Monarch, CHIEF COMPLAINT:  Chest pain   History of Present Illness:  77 year old male with coronary artery disease and ischemic cardiomyopathy s/p multiple stents, hypertension and hyperlipidemia who was admitted with acute NSTEMI, he underwent cardiac catheterization which showed multivessel coronary artery disease, underwent CABG x 4. Remained intubated at time of consult, PCCM was consulted for help evaluation medical management   Pertinent  Medical History   Past Medical History:  Diagnosis Date   Anxiety    Arthritis    "wrists" (07/27/2014)   Coronary artery disease    a.  Pt with 4 stents over the course of 2003-2012;  b.  LHC 6/16:  LAD, RCA, LCx stents patent, OM1 50%, EF 50-55% >> med rx    Elevated LFTs    GERD (gastroesophageal reflux disease)    Hypercholesterolemia    Hypertension    Ischemic cardiomyopathy    cMRI 03/2011:  EF 48% with dist ant, septal, apical and inf-apical dyskinesia, no apical clot, dist ant, apical and septal full thickness scar >> No ICD needed  //  Limited echo 7/19: Large apical septal aneurysm, severe hypokinesis of the entire apex and mid to apical segments of the anterior wall, anterolateral wall and anterior septum-consistent with infarct and most of the LAD territory; EF 35    Left ventricular aneurysm    Limited echo 7/19: Large apical septal aneurysm, severe hypokinesis of the entire apex and mid to apical segments of the anterior wall, anterolateral wall and anterior septum-consistent with infarct and most of the LAD territory; EF 35   Myocardial infarction [facility]) 2003     Significant Hospital Events: Including procedures, antibiotic start and stop dates in addition to other pertinent events   3/14 CABG x4, extubated in the PM  Interim History / Subjective:  Doing well today overall with improving  but significant fatigue. Has not passed gas or had a bowel movement. Working on IS with goal of 750, currently 250-300.   Objective   Blood pressure 109/67, pulse 70, temperature 99.7 F (37.6 C), resp. rate 19, height 5\' 7"  (1.702 m), weight 87.1 kg, SpO2 93 %. PAP: (15-69)/(5-44) 27/14 CO:  [3.5 L/min-5.5 L/min] 5.2 L/min CI:  [1.8 L/min/m2-2.8 L/min/m2] 2.7 L/min/m2  Vent Mode: CPAP;PSV FiO2 (%):  [40 %-50 %] 40 % Set Rate:  [4 bmp-16 bmp] 4 bmp Vt Set:  [530 mL] 530 mL PEEP:  [5 cmH20] 5 cmH20 Pressure Support:  [5 cmH20-10 cmH20] 10 cmH20 Plateau Pressure:  [16 cmH20] 16 cmH20   Intake/Output Summary (Last 24 hours) at 05/09/2022 0815 Last data filed at 05/09/2022 [id number] Gross per 24 hour  Intake 2910.6 ml  Output 6040 ml  Net -3129.4 ml   Filed Weights   05/04/22 0600 05/05/22 0500 05/09/22 0500  Weight: 84 kg 84.3 kg 87.1 kg    Examination: General: Fatigued elderly male laying in bed, in no acute distress HENT: /AT, EOMI Lungs: coarse breath sounds bilaterally, normal work of breathing Cardiovascular: RRR, bandaged sternotomy without surrounding edema or erythema, drains in place  Abdomen: Soft, non-tender, normoactive bowel sounds Extremities: No LEE edema, SCDs in place Neuro: Alert, no focal deficits  Resolved Hospital Problem list     Assessment & Plan:  Coronary artery disease s/p CABG x 4 (05/08/2022) Continue aspirin and Zetia (statin intolerant) Chest tube management CTS Continue pain control with  tramadol, oxycodone and morphine Monitor chest tube output, lasix this AM per CTS Monitor H&H  Acute respiratory insufficiency, postop Stable on room air after extubation post op 3/14   Chronic HFrecEF Resume GDMT with entresto 49-51 mg daily tonight and will add jardiance when foley removed  Hypertension Off pressors this morning, cautiously adding GDMT above  Hyperlipidemia Continue atorvastatin  Diabetes type 2 Patient hemoglobin A1c is 7 On SSI,  adding jardiance when foley removed  Best Practice (right click and "Reselect all SmartList Selections" daily)   Diet/type: Regular consistency (see orders) DVT prophylaxis: LMWH GI prophylaxis: PPI Lines: Central line Foley:  Yes, will remove when able to use urinal  Code Status:  full code Last date of multidisciplinary goals of care discussion [per primary]  Labs   CBC: Recent Labs  Lab 05/02/22 1514 05/03/22 0147 05/07/22 0022 05/08/22 0612 05/08/22 0804 05/08/22 1031 05/08/22 1037 05/08/22 1211 05/08/22 1601 05/08/22 1726 05/08/22 1744 05/09/22 0510  WBC 7.1   < > 5.3 4.9  --   --   --  9.0  --   --  8.1 6.2  NEUTROABS 4.9  --   --   --   --   --   --   --   --   --   --   --   HGB 12.9*   < > 12.5* 12.7*   < > 9.6*   < > 10.4* 9.5* 9.2* 9.8* 9.8*  HCT 39.2   < > 35.4* 37.7*   < > 27.6*   < > 31.0* 28.0* 27.0* 30.1* 29.6*  MCV 94.7   < > 91.5 93.1  --   --   --  94.2  --   --  94.7 95.8  PLT 243   < > 184 189  --  166  --  124*  --   --  130* 136*   < > = values in this interval not displayed.    Basic Metabolic Panel: Recent Labs  Lab 05/06/22 0058 05/07/22 0022 05/08/22 0612 05/08/22 0804 05/08/22 0957 05/08/22 1011 05/08/22 1037 05/08/22 1102 05/08/22 1106 05/08/22 1209 05/08/22 1601 05/08/22 1726 05/08/22 1744 05/09/22 0510  NA 140 136 136   < > 138   < > 137   < > 137 140 140 138 135 132*  K 3.9 4.0 3.8   < > 5.9*   < > 5.6*   < > 5.4* 4.6 4.5 4.6 4.6 4.4  CL 99 101 104   < > 104  --  103  --  103  --   --   --  107 105  CO2 19* 24 21*  --   --   --   --   --   --   --   --   --  19* 20*  GLUCOSE 99 110* 106*   < > 126*  --  158*  --  160*  --   --   --  129* 107*  BUN 19 18 18    < > 15  --  15  --  15  --   --   --  15 14  CREATININE 1.21 1.45* 1.39*   < > 0.90  --  1.00  --  1.00  --   --   --  1.10 1.07  CALCIUM 8.3* 9.3 9.2  --   --   --   --   --   --   --   --   --  7.8* 8.1*  MG  --   --   --   --   --   --   --   --   --   --   --   --   3.5* 2.9*   < > = values in this interval not displayed.   GFR: Estimated Creatinine Clearance: 61.9 mL/min (by C-G formula based on SCr of 1.07 mg/dL). Recent Labs  Lab 05/08/22 0612 05/08/22 1211 05/08/22 1744 05/09/22 0510  WBC 4.9 9.0 8.1 6.2    Liver Function Tests: Recent Labs  Lab 05/02/22 1514  AST 39  ALT 43  ALKPHOS 40  BILITOT 0.8  PROT 6.3*  ALBUMIN 3.6   No results for input(s): "LIPASE", "AMYLASE" in the last 168 hours. No results for input(s): "AMMONIA" in the last 168 hours.  ABG    Component Value Date/Time   PHART 7.360 05/08/2022 1726   PCO2ART 37.2 05/08/2022 1726   PO2ART 148 (H) 05/08/2022 1726   HCO3 21.0 05/08/2022 1726   TCO2 22 05/08/2022 1726   ACIDBASEDEF 4.0 (H) 05/08/2022 1726   O2SAT 99 05/08/2022 1726     Coagulation Profile: Recent Labs  Lab 05/08/22 1211  INR 1.6*    Cardiac Enzymes: No results for input(s): "CKTOTAL", "CKMB", "CKMBINDEX", "TROPONINI" in the last 168 hours.  HbA1C: Hgb A1c MFr Bld  Date/Time Value Ref Range Status  05/06/2022 07:46 AM 7.0 (H) 4.8 - 5.6 % Final    Comment:    (NOTE)         Prediabetes: 5.7 - 6.4         Diabetes: >6.4         Glycemic control for adults with diabetes: <7.0   01/08/2011 08:41 PM 6.0 (H) <5.7 % Final    Comment:    (NOTE)                                                                       According to the ADA Clinical Practice Recommendations for 2011, when HbA1c is used as a screening test:  >=6.5%   Diagnostic of Diabetes Mellitus           (if abnormal result is confirmed) 5.7-6.4%   Increased risk of developing Diabetes Mellitus References:Diagnosis and Classification of Diabetes Mellitus,Diabetes [id number] 1):S62-S69 and Standards of Medical Care in         Diabetes - 2011,Diabetes [id number] (Suppl 1):S11-S61.    CBG: Recent Labs  Lab 05/08/22 2109 05/08/22 2308 05/09/22 0109 05/09/22 0307 05/09/22 0738  GLUCAP 117* 122* 113* 106* 121*     Review of Systems:   No new or worsening chest pain, no abdominal pain  Past Medical History:  He,  has a past medical history of Anxiety, Arthritis, Coronary artery disease, Elevated LFTs, GERD (gastroesophageal reflux disease), Hypercholesterolemia, Hypertension, Ischemic cardiomyopathy, Left ventricular aneurysm, and Myocardial infarction ([place]) (2003).   Surgical History:   Past Surgical History:  Procedure Laterality Date   CARDIAC CATHETERIZATION  06/11/2012   Nonobstructive CAD, patent stents, EF 50%, mild apical dyskinesis   CARDIAC CATHETERIZATION N/A 07/28/2014   Procedure: Left Heart Cath and Coronary Angiography;  Surgeon: Troy Sine, MD;  Location: [place] [facility];  Service: Cardiovascular;  Laterality: N/A;   CORONARY ANGIOPLASTY WITH STENT PLACEMENT  2003-2012    total of 4 stents place   CORONARY STENT INTERVENTION N/A 10/20/2017   Procedure: CORONARY STENT INTERVENTION;  Surgeon: Sherren Mocha, MD;  Location: [place] [facility];  Service: Cardiovascular;  Laterality: N/A;   COSMETIC SURGERY Left 1970's   "dog ripped of my ear"   LEFT HEART CATH AND CORONARY ANGIOGRAPHY N/A 10/20/2017   Procedure: LEFT HEART CATH AND CORONARY ANGIOGRAPHY;  Surgeon: Sherren Mocha, MD;  Location: [place] [facility];  Service: Cardiovascular;  Laterality: N/A;   LEFT HEART CATH AND CORONARY ANGIOGRAPHY N/A 05/02/2022   Procedure: LEFT HEART CATH AND CORONARY ANGIOGRAPHY;  Surgeon: Leonie Man, MD;  Location: [place] [facility];  Service: Cardiovascular;  Laterality: N/A;   LEFT HEART CATHETERIZATION WITH CORONARY ANGIOGRAM N/A 06/11/2012   Procedure: LEFT HEART CATHETERIZATION WITH CORONARY ANGIOGRAM;  Surgeon: Peter M Martinique, MD;  Location: [facility];  Service: Cardiovascular;  Laterality: N/A;     Social History:   reports that he has quit smoking. His smoking use included cigarettes. He has a 15.00 pack-year smoking history. He has never used smokeless tobacco. He reports  that he does not currently use alcohol after a past usage of about 2.0 standard drinks of alcohol per week. He reports that he does not use drugs.   Family History:  His family history includes Coronary artery disease in his father and mother.   Allergies Allergies  Allergen Reactions   Asa [Aspirin] Other (See Comments)    Abdominal bleeding   Penicillins Anaphylaxis    Has patient had a PCN reaction causing immediate rash, facial/tongue/throat swelling, SOB or lightheadedness with hypotension: Yes Has patient had a PCN reaction causing severe rash involving mucus membranes or skin necrosis: No Has patient had a PCN reaction that required hospitalization: No Has patient had a PCN reaction occurring within the last 10 years: No If all of the above answers are "NO", then may proceed with Cephalosporin use.   Statins Other (See Comments)    Stiff joints, muscle tightness, couldn't walk     Home Medications  Prior to Admission medications   Medication Sig Start Date End Date Taking? Authorizing Provider  acetaminophen (TYLENOL) 500 MG tablet Take 1,000 mg by mouth as needed for moderate pain.   Yes [provider]  carvedilol (COREG) 3.125 MG tablet TAKE 1 TABLET BY MOUTH TWICE A DAY WITH FOOD Patient taking differently: Take 3.125 mg by mouth in the morning and at bedtime. 04/21/22  Yes Josue Hector, MD  clopidogrel (PLAVIX) 75 MG tablet Take 1 tablet (75 mg total) by mouth daily. 06/11/12  Yes Arguello, Roger A, PA-C  LORazepam (ATIVAN) 0.5 MG tablet Take 0.5 mg by mouth as needed for anxiety. 11/06/21  Yes [provider]  METAMUCIL FIBER PO Take 1 Scoop by mouth as needed (constipation).   Yes [provider]  nitroGLYCERIN (NITROSTAT) 0.4 MG SL tablet PLACE 1 TABLET (0.4 MG TOTAL) UNDER THE TONGUE EVERY 5 (FIVE) MINUTES AS NEEDED FOR CHEST PAIN. FOR A MAX OF 3 DOSES. IF NO RELIEF, CALL 911. Patient taking differently: Place 0.4 mg under the tongue every 5  (five) minutes as needed for chest pain. 06/03/21  Yes Josue Hector, MD  omega-3 acid ethyl esters (LOVAZA) 1 G capsule Take 1 g by mouth 2 (two) times daily.   Yes [provider]  OVER THE COUNTER MEDICATION Take 1 tablet by mouth  in the morning and at bedtime. Beet Root chew   Yes [provider]  pantoprazole (PROTONIX) 40 MG tablet Take 1 tablet (40 mg total) by mouth daily. 04/26/12  Yes Josue Hector, MD  sacubitril-valsartan (ENTRESTO) 97-103 MG Take 1 tablet by mouth 2 (two) times daily. 03/04/22  Yes Josue Hector, MD  zolpidem (AMBIEN) 10 MG tablet Take 1 tablet (10 mg total) by mouth at bedtime as needed. For sleep Patient taking differently: Take 5-10 mg by mouth at bedtime as needed for sleep. 07/06/12  Yes Josue Hector, MD     Critical care time: [place], DO Internal Medicine Resident, PGY-1 Pager# [phone]

## 2022-05-09 NOTE — Anesthesia Postprocedure Evaluation (Signed)
Anesthesia Post Note  Patient: [name]  Procedure(s) Performed: CORONARY ARTERY BYPASS GRAFTING (CABG) x4, USING LEFT INTERNAL MAMMARY ARTERY AND ENDOSCOPICALLY HARVESTED RIGHT GREATER SAPHENOUS VEIN (Chest) TRANSESOPHAGEAL ECHOCARDIOGRAM     Patient location during evaluation: SICU Anesthesia Type: General Level of consciousness: sedated Pain management: pain level controlled Vital Signs Assessment: post-procedure vital signs reviewed and stable Respiratory status: patient remains intubated per anesthesia plan Cardiovascular status: stable Postop Assessment: no apparent nausea or vomiting Anesthetic complications: no  No notable events documented.  Last Vitals:  Vitals:   05/09/22 0900 05/09/22 1058  BP: 116/63 105/61  Pulse: 71 76  Resp: 16   Temp:    SpO2: 91%     Last Pain:  Vitals:   05/09/22 0920  TempSrc:   PainSc: Asleep                 Chloie Loney S

## 2022-05-10 ENCOUNTER — Inpatient Hospital Stay (HOSPITAL_COMMUNITY): Payer: 59

## 2022-05-10 DIAGNOSIS — I214 Non-ST elevation (NSTEMI) myocardial infarction: Secondary | ICD-10-CM | POA: Diagnosis not present

## 2022-05-10 LAB — CBC
HCT: 31.4 % — ABNORMAL LOW (ref 39.0–52.0)
Hemoglobin: 10.3 g/dL — ABNORMAL LOW (ref 13.0–17.0)
MCH: 31.4 pg (ref 26.0–34.0)
MCHC: 32.8 g/dL (ref 30.0–36.0)
MCV: 95.7 fL (ref 80.0–100.0)
Platelets: 174 10*3/uL (ref 150–400)
RBC: 3.28 MIL/uL — ABNORMAL LOW (ref 4.22–5.81)
RDW: 15.4 % (ref 11.5–15.5)
WBC: 8.7 10*3/uL (ref 4.0–10.5)
nRBC: 0 % (ref 0.0–0.2)

## 2022-05-10 LAB — BASIC METABOLIC PANEL
Anion gap: 8 (ref 5–15)
BUN: 22 mg/dL (ref 8–23)
CO2: 23 mmol/L (ref 22–32)
Calcium: 8.7 mg/dL — ABNORMAL LOW (ref 8.9–10.3)
Chloride: 100 mmol/L (ref 98–111)
Creatinine, Ser: 1.25 mg/dL — ABNORMAL HIGH (ref 0.61–1.24)
GFR, Estimated: 60 mL/min — ABNORMAL LOW (ref >60)
Glucose, Bld: 131 mg/dL — ABNORMAL HIGH (ref 70–99)
Potassium: 4.2 mmol/L (ref 3.5–5.1)
Sodium: 131 mmol/L — ABNORMAL LOW (ref 135–145)

## 2022-05-10 LAB — GLUCOSE, CAPILLARY
Glucose-Capillary: 121 mg/dL — ABNORMAL HIGH (ref 70–99)
Glucose-Capillary: 126 mg/dL — ABNORMAL HIGH (ref 70–99)
Glucose-Capillary: 128 mg/dL — ABNORMAL HIGH (ref 70–99)
Glucose-Capillary: 128 mg/dL — ABNORMAL HIGH (ref 70–99)
Glucose-Capillary: 148 mg/dL — ABNORMAL HIGH (ref 70–99)
Glucose-Capillary: 91 mg/dL (ref 70–99)

## 2022-05-10 MED ORDER — ~~LOC~~ CARDIAC SURGERY, PATIENT & FAMILY EDUCATION: Freq: Once | Status: AC

## 2022-05-10 MED ORDER — SODIUM CHLORIDE 0.9 % IV SOLN: 250.0000 mL | INTRAVENOUS | Status: DC | PRN

## 2022-05-10 MED ORDER — SODIUM CHLORIDE 0.9% FLUSH: 3.0000 mL | INTRAVENOUS | Status: DC | PRN

## 2022-05-10 MED ORDER — CARVEDILOL 3.125 MG PO TABS
3.1250 mg | ORAL_TABLET | Freq: Two times a day (BID) | ORAL | Status: DC
  Administered 2022-05-10 – 2022-05-12 (×4): 3.125 mg via ORAL
  Filled 2022-05-10 (×4): qty 1

## 2022-05-10 MED ORDER — SODIUM CHLORIDE 0.9% FLUSH
3.0000 mL | Freq: Two times a day (BID) | INTRAVENOUS | Status: DC
  Administered 2022-05-10 – 2022-05-12 (×5): 3 mL via INTRAVENOUS

## 2022-05-10 MED ORDER — CLOPIDOGREL BISULFATE 75 MG PO TABS
75.0000 mg | ORAL_TABLET | Freq: Every day | ORAL | Status: DC
  Administered 2022-05-10 – 2022-05-12 (×3): 75 mg via ORAL
  Filled 2022-05-10 (×3): qty 1

## 2022-05-10 NOTE — Progress Notes (Signed)
   NAME:  [name], MRN:  [id number], DOB:  05/27/1945, LOS: 8 ADMISSION DATE:  05/02/2022, CONSULTATION DATE:  05/08/2022 REFERRING MD:  Dr. Lavonna Monarch, CHIEF COMPLAINT:  Chest pain   History of Present Illness:  77 year old male with coronary artery disease and ischemic cardiomyopathy s/p multiple stents, hypertension and hyperlipidemia who was admitted with acute NSTEMI, he underwent cardiac catheterization which showed multivessel coronary artery disease, underwent CABG x 4. Remained intubated at time of consult, PCCM was consulted for help evaluation medical management   Pertinent  Medical History   Past Medical History:  Diagnosis Date   Anxiety    Arthritis    "wrists" (07/27/2014)   Coronary artery disease    a.  Pt with 4 stents over the course of 2003-2012;  b.  LHC 6/16:  LAD, RCA, LCx stents patent, OM1 50%, EF 50-55% >> med rx    Elevated LFTs    GERD (gastroesophageal reflux disease)    Hypercholesterolemia    Hypertension    Ischemic cardiomyopathy    cMRI 03/2011:  EF 48% with dist ant, septal, apical and inf-apical dyskinesia, no apical clot, dist ant, apical and septal full thickness scar >> No ICD needed  //  Limited echo 7/19: Large apical septal aneurysm, severe hypokinesis of the entire apex and mid to apical segments of the anterior wall, anterolateral wall and anterior septum-consistent with infarct and most of the LAD territory; EF 35    Left ventricular aneurysm    Limited echo 7/19: Large apical septal aneurysm, severe hypokinesis of the entire apex and mid to apical segments of the anterior wall, anterolateral wall and anterior septum-consistent with infarct and most of the LAD territory; EF 35   Myocardial infarction [facility]) 2003     Significant Hospital Events: Including procedures, antibiotic start and stop dates in addition to other pertinent events   3/14 CABG x4, extubated in the PM  Interim History / Subjective:  No events. IS to around 500. Having some  pain. Bps are marginal.  Objective   Blood pressure (!) 89/54, pulse 61, temperature 97.8 F (36.6 C), temperature source Oral, resp. rate 17, height 5\' 7"  (1.702 m), weight 87.1 kg, SpO2 98 %.        Intake/Output Summary (Last 24 hours) at 05/10/2022 0829 Last data filed at 05/10/2022 0800 Gross per 24 hour  Intake --  Output 2485 ml  Net -2485 ml    Filed Weights   05/04/22 0600 05/05/22 0500 05/09/22 0500  Weight: 84 kg 84.3 kg 87.1 kg    Examination: No distress sitting in chair Heart sounds borderline brady, ext warm Sternotomy scar looks okay Foley yellow urine Abd soft, hypoactive BS Lungs diminished bases due to poor inspiratory effort AOX3   Cr up slightly Sodium remains slightly low 131 CBC stable/benign CXR some retrocardiac atelectasis possible trace effusions, stable  Resolved Hospital Problem list     Assessment & Plan:  Coronary artery disease s/p CABG x 4 (05/08/2022) Chronic HFrEF - GDMT per TCTS - ? Foley out  Acute respiratory insufficiency, postop Stable on 2L after extubation post op 3/14 - Encourage IS (10x/hour), mobility  DM2- doing well on SSI for now  Will follow while in ICU  Erskine Emery MD PCCM

## 2022-05-10 NOTE — Progress Notes (Signed)
[address]       [place] [address]             [phone]      2 Days Post-Op  Procedure(s) (LRB): CORONARY ARTERY BYPASS GRAFTING (CABG) x4, USING LEFT INTERNAL MAMMARY ARTERY AND ENDOSCOPICALLY HARVESTED RIGHT GREATER SAPHENOUS VEIN (N/A) TRANSESOPHAGEAL ECHOCARDIOGRAM (N/A)   Total Length of Stay:  LOS: 8 days    SUBJECTIVE: Feels legs are "heavy" when he walks Eating well Ambulating some  Vitals:   05/10/22 0756 05/10/22 0800  BP:  (!) 89/54  Pulse: (!) 59 61  Resp: 18 17  Temp: 97.8 F (36.6 C)   SpO2: 97% 98%    Intake/Output      03/15 0701 03/16 0700 03/16 0701 03/17 0700   P.O.     I.V. (mL/kg) 30.3 (0.3)    Blood     IV Piggyback     Total Intake(mL/kg) 30.3 (0.3)    Urine (mL/kg/hr) 2510 (1.2) 50 (0.4)   Blood     Chest Tube 0    Total Output 2510 50   Net -2479.7 -50            sodium chloride     sodium chloride     sodium chloride 20 mL/hr at 05/09/22 0800   lactated ringers     lactated ringers     lactated ringers Stopped (05/09/22 0746)   nitroGLYCERIN      CBC    Component Value Date/Time   WBC 8.7 05/10/2022 0210   RBC 3.28 (L) 05/10/2022 0210   HGB 10.3 (L) 05/10/2022 0210   HGB 15.3 06/16/2019 1102   HCT 31.4 (L) 05/10/2022 0210   HCT 45.1 06/16/2019 1102   PLT 174 05/10/2022 0210   PLT 238 06/16/2019 1102   MCV 95.7 05/10/2022 0210   MCV 94 06/16/2019 1102   MCH 31.4 05/10/2022 0210   MCHC 32.8 05/10/2022 0210   RDW 15.4 05/10/2022 0210   RDW 13.8 06/16/2019 1102   LYMPHSABS 1.6 05/02/2022 1514   MONOABS 0.4 05/02/2022 1514   EOSABS 0.2 05/02/2022 1514   BASOSABS 0.0 05/02/2022 1514   CMP     Component Value Date/Time   NA 131 (L) 05/10/2022 0210   NA 139 06/16/2019 1102   K 4.2 05/10/2022 0210   CL 100 05/10/2022 0210   CO2 23 05/10/2022 0210   GLUCOSE 131 (H) 05/10/2022 0210   BUN 22 05/10/2022 0210   BUN 17 06/16/2019 1102   CREATININE 1.25 (H) 05/10/2022 0210   CALCIUM  8.7 (L) 05/10/2022 0210   PROT 6.3 (L) 05/02/2022 1514   PROT 7.3 06/24/2019 1333   ALBUMIN 3.6 05/02/2022 1514   ALBUMIN 4.7 06/24/2019 1333   AST 39 05/02/2022 1514   ALT 43 05/02/2022 1514   ALKPHOS 40 05/02/2022 1514   BILITOT 0.8 05/02/2022 1514   BILITOT 0.5 06/24/2019 1333   GFRNONAA 60 (L) 05/10/2022 0210   GFRAA >60 08/11/2019 0445   ABG    Component Value Date/Time   PHART 7.360 05/08/2022 1726   PCO2ART 37.2 05/08/2022 1726   PO2ART 148 (H) 05/08/2022 1726   HCO3 21.0 05/08/2022 1726   TCO2 22 05/08/2022 1726   ACIDBASEDEF 4.0 (H) 05/08/2022 1726   O2SAT 99 05/08/2022 1726   CBG (last 3)  Recent Labs    05/09/22 2345 05/10/22 0335 05/10/22 0754  GLUCAP 125* 128* 148*  EXAM Lungs: overall clear Card: RR Incision intact  Ext: mild edema Neuro: intact no focal deficits   ASSESSMENT: POD #2 SP Cabg Hemodynamics ok. Will stop amio for now due to heart rate low Pulm: CXR clear Renal: cr slightly elevated. Will hold on diuretics today. Follow Remove foley Ok to transfer to floor   Coralie Common, MD 05/10/2022

## 2022-05-11 LAB — BASIC METABOLIC PANEL
Anion gap: 6 (ref 5–15)
BUN: 28 mg/dL — ABNORMAL HIGH (ref 8–23)
CO2: 26 mmol/L (ref 22–32)
Calcium: 8.5 mg/dL — ABNORMAL LOW (ref 8.9–10.3)
Chloride: 101 mmol/L (ref 98–111)
Creatinine, Ser: 1.22 mg/dL (ref 0.61–1.24)
GFR, Estimated: >60 mL/min (ref >60)
Glucose, Bld: 108 mg/dL — ABNORMAL HIGH (ref 70–99)
Potassium: 4.3 mmol/L (ref 3.5–5.1)
Sodium: 133 mmol/L — ABNORMAL LOW (ref 135–145)

## 2022-05-11 LAB — CBC
HCT: 26.4 % — ABNORMAL LOW (ref 39.0–52.0)
Hemoglobin: 8.8 g/dL — ABNORMAL LOW (ref 13.0–17.0)
MCH: 31.5 pg (ref 26.0–34.0)
MCHC: 33.3 g/dL (ref 30.0–36.0)
MCV: 94.6 fL (ref 80.0–100.0)
Platelets: 178 10*3/uL (ref 150–400)
RBC: 2.79 MIL/uL — ABNORMAL LOW (ref 4.22–5.81)
RDW: 15.4 % (ref 11.5–15.5)
WBC: 5.7 10*3/uL (ref 4.0–10.5)
nRBC: 0 % (ref 0.0–0.2)

## 2022-05-11 LAB — GLUCOSE, CAPILLARY
Glucose-Capillary: 102 mg/dL — ABNORMAL HIGH (ref 70–99)
Glucose-Capillary: 139 mg/dL — ABNORMAL HIGH (ref 70–99)
Glucose-Capillary: 112 mg/dL — ABNORMAL HIGH (ref 70–99)
Glucose-Capillary: 118 mg/dL — ABNORMAL HIGH (ref 70–99)
Glucose-Capillary: 138 mg/dL — ABNORMAL HIGH (ref 70–99)

## 2022-05-11 MED ORDER — FUROSEMIDE 40 MG PO TABS
40.0000 mg | ORAL_TABLET | Freq: Every day | ORAL | Status: DC
  Administered 2022-05-11: 40 mg via ORAL
  Filled 2022-05-11: qty 1

## 2022-05-11 MED ORDER — SIMETHICONE 80 MG PO CHEW
80.0000 mg | CHEWABLE_TABLET | Freq: Four times a day (QID) | ORAL | Status: DC | PRN
  Administered 2022-05-11: 80 mg via ORAL
  Filled 2022-05-11: qty 1

## 2022-05-11 MED ORDER — POLYETHYLENE GLYCOL 3350 17 G PO PACK
17.0000 g | PACK | Freq: Every day | ORAL | Status: DC | PRN
  Administered 2022-05-11: 17 g via ORAL
  Filled 2022-05-11: qty 1

## 2022-05-11 MED ORDER — INSULIN ASPART 100 UNIT/ML IJ SOLN: 0.0000 [IU] | INTRAMUSCULAR | Status: DC

## 2022-05-11 MED ORDER — INSULIN ASPART 100 UNIT/ML IJ SOLN
0.0000 [IU] | Freq: Three times a day (TID) | INTRAMUSCULAR | Status: DC
  Administered 2022-05-11: 2 [IU] via SUBCUTANEOUS

## 2022-05-11 MED ORDER — INSULIN ASPART 100 UNIT/ML IJ SOLN: 0.0000 [IU] | Freq: Three times a day (TID) | INTRAMUSCULAR | Status: DC

## 2022-05-11 MED ORDER — POTASSIUM CHLORIDE CRYS ER 20 MEQ PO TBCR
20.0000 meq | EXTENDED_RELEASE_TABLET | Freq: Every day | ORAL | Status: DC
  Administered 2022-05-11: 20 meq via ORAL
  Filled 2022-05-11: qty 1

## 2022-05-11 NOTE — Progress Notes (Signed)
Pt admitted to [facility] 01 from [facility].  Pt placed on telemetry and CCMD notified.  Pt is A&O X4 and neuro intact. Pt is currently comfortable and not in pain.  Pt is oriented to the unit with call light in reach.      05/11/22 1609  Vitals  Temp 98.6 F (37 C)  Temp Source Oral  BP 111/60  MAP (mmHg) 77  BP Location Right Arm  BP Method Automatic  Patient Position (if appropriate) Sitting  Pulse Rate 73  Pulse Rate Source Monitor  ECG Heart Rate 78  Resp 17  Level of Consciousness  Level of Consciousness Alert  Oxygen Therapy  SpO2 94 %  O2 Device Room Air  MEWS Score  MEWS Temp 0  MEWS Systolic 0  MEWS Pulse 0  MEWS RR 0  MEWS LOC 0  MEWS Score 0  MEWS Score Color Green

## 2022-05-11 NOTE — Discharge Instructions (Signed)

## 2022-05-11 NOTE — Progress Notes (Signed)
[address]       [place] [address]             [phone]      3 Days Post-Op  Procedure(s) (LRB): CORONARY ARTERY BYPASS GRAFTING (CABG) x4, USING LEFT INTERNAL MAMMARY ARTERY AND ENDOSCOPICALLY HARVESTED RIGHT GREATER SAPHENOUS VEIN (N/A) TRANSESOPHAGEAL ECHOCARDIOGRAM (N/A)   Total Length of Stay:  LOS: 9 days    SUBJECTIVE: No issues  Vitals:   05/11/22 0800 05/11/22 0801  BP: (!) 112/57   Pulse: 76   Resp: 14   Temp:  98 F (36.7 C)  SpO2: 93%     Intake/Output      03/16 0701 03/17 0700 03/17 0701 03/18 0700   P.O. 120    I.V. (mL/kg)     Total Intake(mL/kg) 120 (1.4)    Urine (mL/kg/hr) 300 (0.1)    Chest Tube     Total Output 300    Net -180         Urine Occurrence 3 x        sodium chloride     sodium chloride     sodium chloride 20 mL/hr at 05/09/22 0800   sodium chloride     lactated ringers     lactated ringers     lactated ringers Stopped (05/09/22 0746)   nitroGLYCERIN      CBC    Component Value Date/Time   WBC 5.7 05/11/2022 0158   RBC 2.79 (L) 05/11/2022 0158   HGB 8.8 (L) 05/11/2022 0158   HGB 15.3 06/16/2019 1102   HCT 26.4 (L) 05/11/2022 0158   HCT 45.1 06/16/2019 1102   PLT 178 05/11/2022 0158   PLT 238 06/16/2019 1102   MCV 94.6 05/11/2022 0158   MCV 94 06/16/2019 1102   MCH 31.5 05/11/2022 0158   MCHC 33.3 05/11/2022 0158   RDW 15.4 05/11/2022 0158   RDW 13.8 06/16/2019 1102   LYMPHSABS 1.6 05/02/2022 1514   MONOABS 0.4 05/02/2022 1514   EOSABS 0.2 05/02/2022 1514   BASOSABS 0.0 05/02/2022 1514   CMP     Component Value Date/Time   NA 133 (L) 05/11/2022 0158   NA 139 06/16/2019 1102   K 4.3 05/11/2022 0158   CL 101 05/11/2022 0158   CO2 26 05/11/2022 0158   GLUCOSE 108 (H) 05/11/2022 0158   BUN 28 (H) 05/11/2022 0158   BUN 17 06/16/2019 1102   CREATININE 1.22 05/11/2022 0158   CALCIUM 8.5 (L) 05/11/2022 0158   PROT 6.3 (L) 05/02/2022 1514   PROT 7.3 06/24/2019 1333    ALBUMIN 3.6 05/02/2022 1514   ALBUMIN 4.7 06/24/2019 1333   AST 39 05/02/2022 1514   ALT 43 05/02/2022 1514   ALKPHOS 40 05/02/2022 1514   BILITOT 0.8 05/02/2022 1514   BILITOT 0.5 06/24/2019 1333   GFRNONAA >60 05/11/2022 0158   GFRAA >60 08/11/2019 0445   ABG    Component Value Date/Time   PHART 7.360 05/08/2022 1726   PCO2ART 37.2 05/08/2022 1726   PO2ART 148 (H) 05/08/2022 1726   HCO3 21.0 05/08/2022 1726   TCO2 22 05/08/2022 1726   ACIDBASEDEF 4.0 (H) 05/08/2022 1726   O2SAT 99 05/08/2022 1726   CBG (last 3)  Recent Labs    05/10/22 2334 05/11/22 0343 05/11/22 0802  GLUCAP 128* 102* 139*  EXAM Lungs: decreased at the bases Card:rr Incision: dressing dry Ext: warm no edema   ASSESSMENT: POD #3 SP CABG Looks great Home  tomorrow Ambulate today Lasix po Bowel regimen   Coralie Common, MD 05/11/2022

## 2022-05-12 ENCOUNTER — Other Ambulatory Visit: Payer: Self-pay | Admitting: Cardiovascular Disease

## 2022-05-12 LAB — GLUCOSE, CAPILLARY
Glucose-Capillary: 102 mg/dL — ABNORMAL HIGH (ref 70–99)
Glucose-Capillary: 120 mg/dL — ABNORMAL HIGH (ref 70–99)

## 2022-05-12 MED ORDER — ASPIRIN 81 MG PO TBEC: 325.0000 mg | DELAYED_RELEASE_TABLET | Freq: Every day | ORAL | Status: DC

## 2022-05-12 MED ORDER — EZETIMIBE 10 MG PO TABS: 10.0000 mg | ORAL_TABLET | Freq: Every day | ORAL | 1 refills | Status: DC

## 2022-05-12 MED ORDER — OXYCODONE HCL 5 MG PO TABS: 5.0000 mg | ORAL_TABLET | Freq: Four times a day (QID) | ORAL | 0 refills | Status: DC | PRN

## 2022-05-12 MED ORDER — FE FUM-VIT C-VIT B12-FA 460-60-0.01-1 MG PO CAPS: 1.0000 | ORAL_CAPSULE | Freq: Every day | ORAL | 2 refills | Status: AC

## 2022-05-12 MED ORDER — FE FUM-VIT C-VIT B12-FA 460-60-0.01-1 MG PO CAPS
1.0000 | ORAL_CAPSULE | Freq: Every day | ORAL | Status: DC
  Administered 2022-05-12: 1 via ORAL
  Filled 2022-05-12: qty 1

## 2022-05-12 MED ORDER — CARVEDILOL 3.125 MG PO TABS: 3.1250 mg | ORAL_TABLET | Freq: Two times a day (BID) | ORAL | 1 refills | Status: DC

## 2022-05-12 MED ORDER — LACTULOSE 10 GM/15ML PO SOLN
30.0000 g | ORAL | Status: AC
  Administered 2022-05-12: 30 g via ORAL
  Filled 2022-05-12: qty 45

## 2022-05-12 NOTE — Progress Notes (Signed)
Mobility Specialist Progress Note:   05/12/22 1200  Mobility  Activity Ambulated with assistance in hallway  Level of Assistance Standby assist, set-up cues, supervision of patient - no hands on  Assistive Device Front wheel walker  Distance Ambulated (ft) 550 ft  Activity Response Tolerated well  $Mobility charge 1 Mobility   Pt in bed willing to participate in mobility. No complaints of pain. Left in bed with call bell in reach and all needs met.    Gareth Eagle Jaydn Fincher Mobility Specialist Please contact via [employer] or  Rehab Office at [phone]

## 2022-05-12 NOTE — Progress Notes (Addendum)
Attempted to explained discharge instruction but patient is having loose stools. Will attempt to discharge later.

## 2022-05-12 NOTE — Progress Notes (Signed)
Explained discharge instructions to patient. Reviewed follow up appointment and next medication administration times. Also reviewed education. Patient verbalized having an understanding for instructions given. All belongings are in the patient's possession. IV and telemetry were removed. CCMD was notified. No other needs verbalized. Transported downstairs for discharge. 

## 2022-05-12 NOTE — Progress Notes (Addendum)
[address]       ,[place] [address]             [phone]     4 Days Post-Op Procedure(s) (LRB): CORONARY ARTERY BYPASS GRAFTING (CABG) x4, USING LEFT INTERNAL MAMMARY ARTERY AND ENDOSCOPICALLY HARVESTED RIGHT GREATER SAPHENOUS VEIN (N/A) TRANSESOPHAGEAL ECHOCARDIOGRAM (N/A) Subjective: Feels pretty well overall  Objective: Vital signs in last 24 hours: Temp:  [98 F (36.7 C)-98.6 F (37 C)] 98.6 F (37 C) (03/17 2101) Pulse Rate:  [57-76] 76 (03/17 2101) Cardiac Rhythm: Normal sinus rhythm;Bundle branch block (03/17 1900) Resp:  [13-21] 18 (03/18 0713) BP: (94-124)/(57-71) 124/58 (03/18 0713) SpO2:  [91 %-96 %] 95 % (03/17 2101) Weight:  [83.5 kg-84.1 kg] 83.5 kg (03/18 0500)  Hemodynamic parameters for last 24 hours:    Intake/Output from previous day: 03/17 0701 - 03/18 0700 In: 240 [P.O.:240] Out: -  Intake/Output this shift: No intake/output data recorded.  General appearance: alert, cooperative, and no distress Heart: regular rate and rhythm Lungs: clear to auscultation bilaterally Abdomen: benign, + BS Extremities: minor edema Wound: incis healing well  Lab Results: Recent Labs    05/10/22 0210 05/11/22 0158  WBC 8.7 5.7  HGB 10.3* 8.8*  HCT 31.4* 26.4*  PLT 174 178   BMET:  Recent Labs    05/10/22 0210 05/11/22 0158  NA 131* 133*  K 4.2 4.3  CL 100 101  CO2 23 26  GLUCOSE 131* 108*  BUN 22 28*  CREATININE 1.25* 1.22  CALCIUM 8.7* 8.5*    PT/INR: No results for input(s): "LABPROT", "INR" in the last 72 hours. ABG    Component Value Date/Time   PHART 7.360 05/08/2022 1726   HCO3 21.0 05/08/2022 1726   TCO2 22 05/08/2022 1726   ACIDBASEDEF 4.0 (H) 05/08/2022 1726   O2SAT 99 05/08/2022 1726   CBG (last 3)  Recent Labs    05/11/22 1652 05/11/22 2030 05/12/22 0659  GLUCAP 118* 138* 102*    Meds Scheduled Meds:  acetaminophen  1,000 mg Oral Q6H   Or   acetaminophen (TYLENOL) oral liquid 160 mg/5 mL   1,000 mg Per Tube Q6H   aspirin EC  325 mg Oral Daily   Or   aspirin  324 mg Per Tube Daily   bisacodyl  10 mg Oral Daily   Or   bisacodyl  10 mg Rectal Daily   carvedilol  3.125 mg Oral BID WC   Chlorhexidine Gluconate Cloth  6 each Topical Q0600   clopidogrel  75 mg Oral Daily   docusate sodium  200 mg Oral Daily   ezetimibe  10 mg Oral Daily   furosemide  40 mg Oral Daily   insulin aspart  0-24 Units Subcutaneous TID AC & HS   mouth rinse  15 mL Mouth Rinse 4 times per day   pantoprazole  40 mg Oral Daily   potassium chloride  20 mEq Oral Daily   sodium chloride flush  3 mL Intravenous Q12H   sodium chloride flush  3 mL Intravenous Q12H   Continuous Infusions:  sodium chloride     sodium chloride     sodium chloride 20 mL/hr at 05/09/22 0800   sodium chloride     lactated ringers     lactated ringers     lactated ringers Stopped (05/09/22 0746)   PRN Meds:.sodium chloride, sodium chloride, lactated ringers, metoprolol tartrate, midazolam, morphine injection, ondansetron (ZOFRAN) IV, mouth rinse, oxyCODONE, polyethylene glycol,  simethicone, sodium chloride flush, sodium chloride flush, traMADol  Xrays No results found.  Assessment/Plan: S/P Procedure(s) (LRB): CORONARY ARTERY BYPASS GRAFTING (CABG) x4, USING LEFT INTERNAL MAMMARY ARTERY AND ENDOSCOPICALLY HARVESTED RIGHT GREATER SAPHENOUS VEIN (N/A) TRANSESOPHAGEAL ECHOCARDIOGRAM (N/A) POD#4  1 afeb, VSS, sinus rhythm, on plavix/ASA, intol to statins 2 O2 sats good on RA 3 UOP- voiding well, not recorded, weight below preop 4 BS well controlled 5 minor hyponatremia, improved trend, can stop diuretics,  creat normalized, BUN up slightly- a little pre-renal azotemic  6 expected ABLA- not at transfusion threshold, will add Iron supp 7 requesting rolling walker 8 appears stable for d/c  LOS: 10 days    [name] 05/12/2022  Agree with above

## 2022-05-12 NOTE — Progress Notes (Signed)
Patient needs to have a bowel movement prior to discharging. Completed his AVS  Gomez Cleverly SWOT RN

## 2022-05-12 NOTE — TOC Transition Note (Signed)
Transition of Care (TOC) - CM/SW Discharge Note Marvetta Gibbons RN, BSN Transitions of Care Unit 4E- RN Case Manager See Treatment Team for direct phone #   Patient Details  Name: [name] MRN: [id number] Date of Birth: [birth year]  Transition of Care [facility]) CM/SW Contact:  Dawayne Patricia, RN Phone Number: 05/12/2022, 10:00 AM   Clinical Narrative:    Pt stable for transition home today, bedside RN confirmed with pt DME needs- pt voiced he wants both RW and BSC for home.  Orders have been placed, pt with no preference for provider.   Call made to [facility] for DME referral- RW and BSC to be delivered to room prior to discharge.   No further TOC needs noted   Final next level of care: Home/Self Care Barriers to Discharge: Barriers Resolved   Patient Goals and CMS Choice CMS Medicare.gov Compare Post Acute Care list provided to:: Patient Choice offered to / list presented to : Patient  Discharge Placement                 Home        Discharge Plan and Services Additional resources added to the After Visit Summary for   In-house Referral: NA Discharge Planning Services: CM Consult Post Acute Care Choice: Durable Medical Equipment          DME Arranged: Bedside commode, Walker rolling DME Agency: [facility] Date DME Agency Contacted: 05/12/22 Time DME Agency Contacted: 1000 Representative spoke with at DME Agency: Huey Romans HH Arranged: NA HH Agency: NA        Social Determinants of Health ([place]) Interventions SDOH Screenings   Food Insecurity: No Food Insecurity (05/05/2022)  Housing: Low Risk  (05/05/2022)  Transportation Needs: No Transportation Needs (05/05/2022)  Utilities: Not At Risk (05/05/2022)  Depression (PHQ2-9): Low Risk  (04/02/2018)  Tobacco Use: Medium Risk (05/09/2022)     Readmission Risk Interventions    05/12/2022   10:00 AM  Readmission Risk Prevention Plan  Transportation Screening Complete  Home Care Screening Complete   Medication Review (RN CM) Complete

## 2022-05-12 NOTE — Care Management Important Message (Signed)
Important Message  Patient Details  Name: [name] MRN: [id number] Date of Birth: 27-Apr-1945   Medicare Important Message Given:  Yes     [name] 05/12/2022, 8:14 AM

## 2022-05-12 NOTE — Progress Notes (Signed)
Pt received OHS book and education on restrictions, heart healthy diet, ex guidelines, Move in the Tube sheet, incentive spirometer use when d/c and CRPII. Pt denies questions and was encouraged to look in the book for additional information. Referral placed to [facility]    Christen Bame 05/12/2022 8:46 AM   [id number]

## 2022-05-14 MED FILL — Electrolyte-R (PH 7.4) Solution: INTRAVENOUS | Qty: 4000 | Status: AC

## 2022-05-14 MED FILL — Heparin Sodium (Porcine) Inj 1000 Unit/ML: INTRAMUSCULAR | Qty: 20 | Status: AC

## 2022-05-14 MED FILL — Sodium Chloride IV Soln 0.9%: INTRAVENOUS | Qty: 2000 | Status: AC

## 2022-05-14 MED FILL — Sodium Bicarbonate IV Soln 8.4%: INTRAVENOUS | Qty: 50 | Status: AC

## 2022-05-15 ENCOUNTER — Ambulatory Visit: Payer: 59

## 2022-05-15 NOTE — Progress Notes (Addendum)
[address]       [name] [address]             [phone]  HPI: This is a 77 year old male with a past medical history of coronary artery disease, ischemic cardiomyopathy, hyperlipidemia, DM, GERD who was found to have a NSTEMI. He underwent a CABG x 4 (LIMA to the LAD and RSVG from the aorta to the PDA and from the aorta to the OM1 sequenced to OM2 ) by Dr. Lavonna Monarch on 05/08/2022. He was discharged in stable condition on 05/12/2022. He denies  chest pain but does endorse occasional shortness of breath with exertion. He uses incentive spirometer daily  Current Outpatient Medications  Medication Sig Dispense Refill   acetaminophen (TYLENOL) 500 MG tablet Take 1,000 mg by mouth as needed for moderate pain.     carvedilol (COREG) 3.125 MG tablet Take 1 tablet (3.125 mg total) by mouth 2 (two) times daily with a meal. 60 tablet 1   clopidogrel (PLAVIX) 75 MG tablet Take 1 tablet (75 mg total) by mouth daily. 30 tablet 3   ezetimibe (ZETIA) 10 MG tablet Take 1 tablet (10 mg total) by mouth daily. 30 tablet 1   Fe Fum-Vit C-Vit B12-FA (TRIGELS-F FORTE) CAPS capsule Take 1 capsule by mouth daily after breakfast. 30 capsule 2   LORazepam (ATIVAN) 0.5 MG tablet Take 0.5 mg by mouth as needed for anxiety.     METAMUCIL FIBER PO Take 1 Scoop by mouth as needed (constipation).     omega-3 acid ethyl esters (LOVAZA) 1 G capsule Take 1 g by mouth 2 (two) times daily.     OVER THE COUNTER MEDICATION Take 1 tablet by mouth in the morning and at bedtime. Beet Root chew     oxyCODONE (OXY IR/ROXICODONE) 5 MG immediate release tablet Take 1 tablet (5 mg total) by mouth every 6 (six) hours as needed for up to 7 days for severe pain. 28 tablet 0   pantoprazole (PROTONIX) 40 MG tablet Take 1 tablet (40 mg total) by mouth daily. 30 tablet 11   zolpidem (AMBIEN) 10 MG tablet Take 1 tablet (10 mg total) by mouth at bedtime as needed. For sleep (Patient taking differently: Take 5-10 mg by  mouth at bedtime as needed for sleep.) 30 tablet 5  Vital Signs: Vitals:   05/22/22 1536  BP: 119/78  Pulse: 83  Resp: 20  SpO2: 98%      Physical Exam: CV-RRR Pulmonary-Clear to auscultation bilaterally Abdomen-Soft, non tender, bowel sounds present Extremities-Trace RLE edema (EVH) Wounds-Sternal wound with eschar lower portion of wound. RLE wound is clean, dry, well healed. 3 silk sutures removed (chest tube wounds)   Diagnostic Tests: Narrative & Impression  CLINICAL DATA:  CABG, chest pain, shortness of breath   EXAM: CHEST - 2 VIEW   COMPARISON:  05/10/2022   FINDINGS: Cardiomegaly status post median sternotomy and CABG. Trace left pleural effusion. Disc degenerative disease of the thoracic spine.   IMPRESSION: Cardiomegaly status post median sternotomy and CABG. Trace left pleural effusion. No acute airspace opacity.     Electronically Signed   By: Delanna Ahmadi M.D.   On: 05/22/2022 15:14   Impression and Plan: [name] is recovering well from coronary artery bypass grafting surgery. We reviewed his chest x ray. He asked for a refill on Oxy and a prescription was sent for #14 (no refill) to his pharmacy. He will not receive any more Oxy for  pain after this refill. He was encouraged to walk several times daily and increase his frequency and duration as tolerates. He was instructed to continue with sternal precautions (no more than lifting 10 pounds) for at least 8 weeks. At the next office visit, we will discuss driving and participation in cardiac rehab.He has a follow up appointment to see cardiology in May. He will return to see Dr. Lavonna Monarch in 2 weeks.     Nani Skillern, PA-C [facility] [phone]

## 2022-05-16 ENCOUNTER — Other Ambulatory Visit: Payer: Self-pay | Admitting: Thoracic Surgery (Cardiothoracic Vascular Surgery)

## 2022-05-16 DIAGNOSIS — Z951 Presence of aortocoronary bypass graft: Secondary | ICD-10-CM

## 2022-05-21 ENCOUNTER — Ambulatory Visit: Payer: 59 | Admitting: Student

## 2022-05-21 NOTE — Progress Notes (Deleted)
Patient ID: Theron Wolsey                 DOB: 07/09/1945                    MRN: [id number]      HPI: [name] is a 77 y.o. male patient referred to lipid clinic by Dr. Ellyn Hack. PMH is significant for angina, CAD, MI x 3 (2003, 2009, 2012)s/p stent s/p CABG x4,Unable to take statins due to elevation of liver enzymes, ICM, IBS, CKD stage III, T2DM.    Reviewed options for lowering LDL cholesterol, including ezetimibe, PCSK-9 inhibitors, bempedoic acid and inclisiran.  Discussed mechanisms of action, dosing, side effects and potential decreases in LDL cholesterol.  Also reviewed cost information and potential options for patient assistance.  Current Medications: Zetia 10 mg daily, Lovaza 1 gm twice daily Intolerances: simvastatin 5 mg daily, lovastatin 10 mg daily  Risk Factors: CAD, MI x 3 (2003, 2009, 2012)s/p stent s/p CABG x4, LDL goal: <55 mg/dl   Diet:   Exercise:   Family History:   Social History:   Labs: Lipid Panel     Component Value Date/Time   CHOL 210 (H) 06/16/2019 1102   TRIG 350 (H) 06/16/2019 1102   HDL 38 (L) 06/16/2019 1102   CHOLHDL 5.5 (H) 06/16/2019 1102   CHOLHDL 5.3 06/11/2012 0234   VLDL 73 (H) 06/11/2012 0234   LDLCALC 112 (H) 06/16/2019 1102   LABVLDL 60 (H) 06/16/2019 1102    Past Medical History:  Diagnosis Date   Anxiety    Arthritis    "wrists" (07/27/2014)   Coronary artery disease    a.  Pt with 4 stents over the course of 2003-2012;  b.  LHC 6/16:  LAD, RCA, LCx stents patent, OM1 50%, EF 50-55% >> med rx    Elevated LFTs    GERD (gastroesophageal reflux disease)    Hypercholesterolemia    Hypertension    Ischemic cardiomyopathy    cMRI 03/2011:  EF 48% with dist ant, septal, apical and inf-apical dyskinesia, no apical clot, dist ant, apical and septal full thickness scar >> No ICD needed  //  Limited echo 7/19: Large apical septal aneurysm, severe hypokinesis of the entire apex and mid to apical segments of the anterior wall,  anterolateral wall and anterior septum-consistent with infarct and most of the LAD territory; EF 35    Left ventricular aneurysm    Limited echo 7/19: Large apical septal aneurysm, severe hypokinesis of the entire apex and mid to apical segments of the anterior wall, anterolateral wall and anterior septum-consistent with infarct and most of the LAD territory; EF 35   Myocardial infarction [facility]) 2003    Current Outpatient Medications on File Prior to Visit  Medication Sig Dispense Refill   acetaminophen (TYLENOL) 500 MG tablet Take 1,000 mg by mouth as needed for moderate pain.     carvedilol (COREG) 3.125 MG tablet Take 1 tablet (3.125 mg total) by mouth 2 (two) times daily with a meal. 60 tablet 1   clopidogrel (PLAVIX) 75 MG tablet Take 1 tablet (75 mg total) by mouth daily. 30 tablet 3   ezetimibe (ZETIA) 10 MG tablet Take 1 tablet (10 mg total) by mouth daily. 30 tablet 1   Fe Fum-Vit C-Vit B12-FA (TRIGELS-F FORTE) CAPS capsule Take 1 capsule by mouth daily after breakfast. 30 capsule 2   LORazepam (ATIVAN) 0.5 MG tablet Take 0.5 mg by mouth as needed for anxiety.  METAMUCIL FIBER PO Take 1 Scoop by mouth as needed (constipation).     omega-3 acid ethyl esters (LOVAZA) 1 G capsule Take 1 g by mouth 2 (two) times daily.     OVER THE COUNTER MEDICATION Take 1 tablet by mouth in the morning and at bedtime. Beet Root chew     pantoprazole (PROTONIX) 40 MG tablet Take 1 tablet (40 mg total) by mouth daily. 30 tablet 11   zolpidem (AMBIEN) 10 MG tablet Take 1 tablet (10 mg total) by mouth at bedtime as needed. For sleep (Patient taking differently: Take 5-10 mg by mouth at bedtime as needed for sleep.) 30 tablet 5   No current facility-administered medications on file prior to visit.    Allergies  Allergen Reactions   Asa [Aspirin] Other (See Comments)    Abdominal bleeding   Penicillins Anaphylaxis    Has patient had a PCN reaction causing immediate rash, facial/tongue/throat swelling,  SOB or lightheadedness with hypotension: Yes Has patient had a PCN reaction causing severe rash involving mucus membranes or skin necrosis: No Has patient had a PCN reaction that required hospitalization: No Has patient had a PCN reaction occurring within the last 10 years: No If all of the above answers are "NO", then may proceed with Cephalosporin use.   Statins Other (See Comments)    Stiff joints, muscle tightness, couldn't walk    Assessment/Plan:  1. Hyperlipidemia -  No problems updated. No problem-specific Assessment & Plan notes found for this encounter.    Thank you,  Cammy Copa, Pharm.D [facility] [place] [address], [place], [place] [address]  Phone: [phone]; Fax: [phone]

## 2022-05-22 ENCOUNTER — Ambulatory Visit: Payer: 59 | Attending: Physician Assistant | Admitting: Physician Assistant

## 2022-05-22 ENCOUNTER — Ambulatory Visit
Admission: RE | Admit: 2022-05-22 | Discharge: 2022-05-22 | Disposition: A | Discharge status: Home / Self Care | Payer: 59 | Source: Ambulatory Visit | Attending: Thoracic Surgery (Cardiothoracic Vascular Surgery) | Admitting: Thoracic Surgery (Cardiothoracic Vascular Surgery)

## 2022-05-22 ENCOUNTER — Ambulatory Visit (INDEPENDENT_AMBULATORY_CARE_PROVIDER_SITE_OTHER): Payer: Self-pay | Admitting: Physician Assistant

## 2022-05-22 VITALS — BP 119/78 | HR 83 | Resp 20

## 2022-05-22 DIAGNOSIS — Z951 Presence of aortocoronary bypass graft: Secondary | ICD-10-CM

## 2022-05-22 MED ORDER — OXYCODONE HCL 5 MG PO TABS: 5.0000 mg | ORAL_TABLET | Freq: Four times a day (QID) | ORAL | 0 refills | Status: AC | PRN

## 2022-05-22 NOTE — Progress Notes (Deleted)
Office Visit    Patient Name: [name] Date of Encounter: 05/22/2022  PCP:  Velna Hatchet, [place]  Cardiologist:  Jenkins Rouge, MD  Advanced Practice Provider:  No care team member to display Electrophysiologist:  None   HPI    [name] is a 77 y.o. male with a past medical history of advanced CAD and ischemic DCM, anterior MI with DES to LAD and circumflex in 2012 in [place], recurrent [place] July 2012 with stenting of RCA (unstable angina August 2019 cath by Dr. Burt Knack), August 2019 DES to mid LAD for in-stent restenosis presents today for follow-up appointment.  All other arteries were okay except small OM1 not suitable for intervention.  TTE with EF 35%, follow-up MRI 12/25/2017 to risk stratify.  For AICD EF 46% full-thickness scar involving distal septum and anterior wall, apex, and inferior apex  Patient with history of GI bleed March 2019 with duodenal erosions that were clipped.  Intolerant to aspirin.  Also history of CKD,  HLD, HTN.   No angina.  Compliant with meds.  No recurrent GI bleeding.  No signs of volume overload or CHF.  Functional class I.  Referred to lipid clinic did not want to take PSK 9 and inhibitor injections.  He wanted to think about Nexletol because it was new at the time.  Hospitalized 08/08/2019 for abdominal pain.  CT with colitis/diverticulitis.  Rx with bowel rest and antibiotics with improvement.  DC with Cipro.  Colonoscopy with Dr. Paulita Fujita.  Today. He ***  Past Medical History    Past Medical History:  Diagnosis Date   Anxiety    Arthritis    "wrists" (07/27/2014)   Coronary artery disease    a.  Pt with 4 stents over the course of 2003-2012;  b.  LHC 6/16:  LAD, RCA, LCx stents patent, OM1 50%, EF 50-55% >> med rx    Elevated LFTs    GERD (gastroesophageal reflux disease)    Hypercholesterolemia    Hypertension    Ischemic cardiomyopathy    cMRI 03/2011:  EF 48% with dist ant, septal, apical and  inf-apical dyskinesia, no apical clot, dist ant, apical and septal full thickness scar >> No ICD needed  //  Limited echo 7/19: Large apical septal aneurysm, severe hypokinesis of the entire apex and mid to apical segments of the anterior wall, anterolateral wall and anterior septum-consistent with infarct and most of the LAD territory; EF 35    Left ventricular aneurysm    Limited echo 7/19: Large apical septal aneurysm, severe hypokinesis of the entire apex and mid to apical segments of the anterior wall, anterolateral wall and anterior septum-consistent with infarct and most of the LAD territory; EF 35   Myocardial infarction ([place]) 2003   Past Surgical History:  Procedure Laterality Date   CARDIAC CATHETERIZATION  06/11/2012   Nonobstructive CAD, patent stents, EF 50%, mild apical dyskinesis   CARDIAC CATHETERIZATION N/A 07/28/2014   Procedure: Left Heart Cath and Coronary Angiography;  Surgeon: Troy Sine, MD;  Location: [facility];  Service: Cardiovascular;  Laterality: N/A;   CORONARY ANGIOPLASTY WITH STENT PLACEMENT  2003-2012    total of 4 stents place   CORONARY ARTERY BYPASS GRAFT N/A 05/08/2022   Procedure: CORONARY ARTERY BYPASS GRAFTING (CABG) x4, USING LEFT INTERNAL MAMMARY ARTERY AND ENDOSCOPICALLY HARVESTED RIGHT GREATER SAPHENOUS VEIN;  Surgeon: Coralie Common, MD;  Location: [facility];  Service: Open Heart Surgery;  Laterality: N/A;  CORONARY STENT INTERVENTION N/A 10/20/2017   Procedure: CORONARY STENT INTERVENTION;  Surgeon: Sherren Mocha, MD;  Location: [facility];  Service: Cardiovascular;  Laterality: N/A;   COSMETIC SURGERY Left 1970's   "dog ripped of my ear"   LEFT HEART CATH AND CORONARY ANGIOGRAPHY N/A 10/20/2017   Procedure: LEFT HEART CATH AND CORONARY ANGIOGRAPHY;  Surgeon: Sherren Mocha, MD;  Location: [facility];  Service: Cardiovascular;  Laterality: N/A;   LEFT HEART CATH AND CORONARY ANGIOGRAPHY N/A 05/02/2022   Procedure: LEFT HEART CATH  AND CORONARY ANGIOGRAPHY;  Surgeon: Leonie Man, MD;  Location: [facility];  Service: Cardiovascular;  Laterality: N/A;   LEFT HEART CATHETERIZATION WITH CORONARY ANGIOGRAM N/A 06/11/2012   Procedure: LEFT HEART CATHETERIZATION WITH CORONARY ANGIOGRAM;  Surgeon: Peter M Martinique, MD;  [facility]: [facility];  Service: Cardiovascular;  Laterality: N/A;   TEE WITHOUT CARDIOVERSION N/A 05/08/2022   Procedure: TRANSESOPHAGEAL ECHOCARDIOGRAM;  Surgeon: Coralie Common, MD;  Location: [facility];  Service: Open Heart Surgery;  Laterality: N/A;    Allergies  Allergies  Allergen Reactions   Asa [Aspirin] Other (See Comments)    Abdominal bleeding   Penicillins Anaphylaxis    Has patient had a PCN reaction causing immediate rash, facial/tongue/throat swelling, SOB or lightheadedness with hypotension: Yes Has patient had a PCN reaction causing severe rash involving mucus membranes or skin necrosis: No Has patient had a PCN reaction that required hospitalization: No Has patient had a PCN reaction occurring within the last 10 years: No If all of the above answers are "NO", then may proceed with Cephalosporin use.   Statins Other (See Comments)    Stiff joints, muscle tightness, couldn't walk   EKGs/Labs/Other Studies Reviewed:   The following studies were reviewed today:  Echo 05/08/22 Indications:     CABG  Performing Phys: [id number] Coralie Common   Complications: No known complications during this procedure.  POST-OP IMPRESSIONS  _ Left Ventricle: The left ventricle is unchanged from pre-bypass.  _ Right Ventricle: The right ventricle appears unchanged from pre-bypass.  _ Aorta: The aorta appears unchanged from pre-bypass.  _ Left Atrium: The left atrium appears unchanged from pre-bypass.  _ Left Atrial Appendage: The left atrial appendage appears unchanged from  pre-bypass.  _ Aortic Valve: The aortic valve appears unchanged from pre-bypass.  _ Mitral Valve: The mitral valve appears  unchanged from pre-bypass.  _ Tricuspid Valve: The tricuspid valve appears unchanged from pre-bypass.  _ Pulmonic Valve: The pulmonic valve appears unchanged from pre-bypass.  _ Interatrial Septum: The interatrial septum appears unchanged from  pre-bypass.  _ Interventricular Septum: The interventricular septum appears unchanged  from  pre-bypass.  _ Pericardium: The pericardium appears unchanged from pre-bypass.  _ Comments: Good LV function post bypass  No RWMA, valves as before.   PRE-OP FINDINGS   Left Ventricle: The left ventricle has normal systolic function, with an  ejection fraction of 55-60%. The cavity size was normal. There is no  increase in left ventricular wall thickness. No evidence of left  ventricular regional wall motion  abnormalities. There is borderline concentric left ventricular hypertrophy  of the inferior and anterior segments. Left ventricular diastolic  parameters are consistent with Grade I diastolic dysfunction (impaired  relaxation).    Right Ventricle: The right ventricle has normal systolic function. The  cavity was normal. There is no increase in right ventricular wall  thickness.   Left Atrium: Left atrial size was normal in size. No left atrial/left  atrial appendage thrombus was detected. The left atrial appendage is well  visualized and there is no evidence of thrombus present.   Right Atrium: Right atrial size was normal in size.   Interatrial Septum: No atrial level shunt detected by color flow Doppler.  Agitated saline contrast was given intravenously to evaluate for  intracardiac shunting. There is no evidence of a patent foramen ovale.   Pericardium: There is no evidence of pericardial effusion.   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 Mitral valve regurgitation  is mild by color flow Doppler. The MR jet is centrally-directed. 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 There is moderate thickening and moderate  calcification present on  the mitral valve  P3 cusp with mildly decreased mobility and there is moderate thickening  and moderate calcification present on the mitral valve A1 cusp with normal  mobility.   Tricuspid Valve: The tricuspid valve was normal in structure. Tricuspid  valve regurgitation was not visualized by color flow Doppler. No evidence  of tricuspid stenosis is present.   Aortic Valve: The aortic valve is normal in structure. Aortic valve  regurgitation is mild by color flow Doppler. The jet is  centrally-directed. There is no stenosis of the aortic valve.  There is mild thickening and mild calcification present on the aortic  valve right coronary cusp with normal mobility and there is mild  thickening and mild calcification present on the aortic valve left  coronary cusp with normal mobility and there is  moderate thickening and moderate calcification present on the aortic valve  non-coronary cusp with normal mobility.   Pulmonic Valve: The pulmonic valve was normal in structure, with normal.  Pulmonic valve regurgitation is mild by color flow Doppler.    Aorta: The aortic root and ascending aorta are normal in size and  structure. There is borderline dilatation of the aortic root, measuring 37  mm.    EKG:  EKG is *** ordered today.  The ekg ordered today demonstrates ***  Recent Labs: 05/02/2022: ALT 43 05/09/2022: Magnesium 2.5 05/11/2022: BUN 28; Creatinine, Ser 1.22; Hemoglobin 8.8; Platelets 178; Potassium 4.3; Sodium 133  Recent Lipid Panel    Component Value Date/Time   CHOL 210 (H) 06/16/2019 1102   TRIG 350 (H) 06/16/2019 1102   HDL 38 (L) 06/16/2019 1102   CHOLHDL 5.5 (H) 06/16/2019 1102   CHOLHDL 5.3 06/11/2012 0234   VLDL 73 (H) 06/11/2012 0234   LDLCALC 112 (H) 06/16/2019 1102    Risk Assessment/Calculations:  {Does this patient have ATRIAL FIBRILLATION?:[phone]}  Home Medications   No outpatient medications have been marked as taking for the 05/22/22 encounter  (Appointment) with Elgie Collard, PA-C.     Review of Systems   ***   All other systems reviewed and are otherwise negative except as noted above.  Physical Exam    VS:  There were no vitals taken for this visit. , BMI There is no height or weight on file to calculate BMI.  Wt Readings from Last 3 Encounters:  05/12/22 184 lb 1.6 oz (83.5 kg)  03/14/22 193 lb 3.2 oz (87.6 kg)  01/28/21 185 lb (83.9 kg)     GEN: Well nourished, well developed, in no acute distress. HEENT: normal. Neck: Supple, no JVD, carotid bruits, or masses. Cardiac: ***RRR, no murmurs, rubs, or gallops. No clubbing, cyanosis, edema.  ***Radials/PT 2+ and equal bilaterally.  Respiratory:  ***Respirations regular and unlabored, clear to auscultation bilaterally. GI: Soft, nontender, nondistended. MS: No deformity or  atrophy. Skin: Warm and dry, no rash. Neuro:  Strength and sensation are intact. Psych: Normal affect.  Assessment & Plan    CAD s/p CABG x 4 Ischemic CM HTN CKD HLD Hx of GI bleed  No BP recorded.  {Refresh Note OR Click here to enter BP  :1}***    {The patient has an active order for outpatient cardiac rehabilitation.   Please indicate if the patient is ready to start. Do NOT delete this.  It will auto delete.  Refresh note, then sign.              Click here to document readiness and see contraindications.  :1}  Cardiac Rehabilitation Eligibility Assessment     Disposition: Follow up {follow up:15908} with Jenkins Rouge, MD or APP.  Signed, Elgie Collard, PA-C 05/22/2022, 12:47 PM [facility]

## 2022-06-01 NOTE — Progress Notes (Unsigned)
[address]       [place] [address]             [id number]           Chrishon Frerking [facility] Medical Record #[id number] Date of Birth: [birth year]  Craige Cotta, MD  Chief Complaint: Sp CABx 4 05/08/22    History of Present Illness:     Pt sp above. Biggest issues still in pain in leg incision and chest. Pt is ambulatory walking in house but not outside. Feels that his chest is about to explode with sneezing but no clicks or unsteadiness in chest. No leg swelling. Not on Asa due to GI bleed in past. Feels that overall he is improving      Past Medical History:  Diagnosis Date   Anxiety    Arthritis    "wrists" (07/27/2014)   Coronary artery disease    a.  Pt with 4 stents over the course of 2003-2012;  b.  LHC 6/16:  LAD, RCA, LCx stents patent, OM1 50%, EF 50-55% >> med rx    Elevated LFTs    GERD (gastroesophageal reflux disease)    Hypercholesterolemia    Hypertension    Ischemic cardiomyopathy    cMRI 03/2011:  EF 48% with dist ant, septal, apical and inf-apical dyskinesia, no apical clot, dist ant, apical and septal full thickness scar >> No ICD needed  //  Limited echo 7/19: Large apical septal aneurysm, severe hypokinesis of the entire apex and mid to apical segments of the anterior wall, anterolateral wall and anterior septum-consistent with infarct and most of the LAD territory; EF 35    Left ventricular aneurysm    Limited echo 7/19: Large apical septal aneurysm, severe hypokinesis of the entire apex and mid to apical segments of the anterior wall, anterolateral wall and anterior septum-consistent with infarct and most of the LAD territory; EF 35   Myocardial infarction ([facility]) 2003    Past Surgical History:  Procedure Laterality Date   CARDIAC CATHETERIZATION  06/11/2012   Nonobstructive CAD, patent stents, EF 50%, mild apical dyskinesis   CARDIAC CATHETERIZATION N/A 07/28/2014   Procedure: Left Heart Cath and  Coronary Angiography;  Surgeon: Lennette Bihari, MD;  Location: [facility];  Service: Cardiovascular;  Laterality: N/A;   CORONARY ANGIOPLASTY WITH STENT PLACEMENT  2003-2012    total of 4 stents place   CORONARY ARTERY BYPASS GRAFT N/A 05/08/2022   Procedure: CORONARY ARTERY BYPASS GRAFTING (CABG) x4, USING LEFT INTERNAL MAMMARY ARTERY AND ENDOSCOPICALLY HARVESTED RIGHT GREATER SAPHENOUS VEIN;  Surgeon: Eugenio Hoes, MD;  Location: [facility];  Service: Open Heart Surgery;  Laterality: N/A;   CORONARY STENT INTERVENTION N/A 10/20/2017   Procedure: CORONARY STENT INTERVENTION;  Surgeon: Tonny Bollman, MD;  Location: [facility];  Service: Cardiovascular;  Laterality: N/A;   COSMETIC SURGERY Left 1970's   "dog ripped of my ear"   LEFT HEART CATH AND CORONARY ANGIOGRAPHY N/A 10/20/2017   Procedure: LEFT HEART CATH AND CORONARY ANGIOGRAPHY;  Surgeon: Tonny Bollman, MD;  Location: [facility];  Service: Cardiovascular;  Laterality: N/A;   LEFT HEART CATH AND CORONARY ANGIOGRAPHY N/A 05/02/2022   Procedure: LEFT HEART CATH AND CORONARY ANGIOGRAPHY;  Surgeon: Marykay Lex, MD;  Location: [facility] INVASIVE CV LAB;  Service: Cardiovascular;  Laterality: N/A;   LEFT HEART CATHETERIZATION WITH CORONARY ANGIOGRAM N/A 06/11/2012   Procedure: LEFT HEART CATHETERIZATION WITH CORONARY ANGIOGRAM;  Surgeon: Peter M SwazilandJordan, MD;  [facility];  Service: Cardiovascular;  Laterality: N/A;   TEE WITHOUT CARDIOVERSION N/A 05/08/2022   Procedure: TRANSESOPHAGEAL ECHOCARDIOGRAM;  Surgeon: Eugenio HoesWeldner, Veneda Kirksey, MD;  [facility];  Service: Open Heart Surgery;  Laterality: N/A;    Social History   Tobacco Use  Smoking Status Former   Packs/day: 1.00   Years: 15.00   Additional pack years: 0.00   Total pack years: 15.00   Types: Cigarettes  Smokeless Tobacco Never  Tobacco Comments   "quit smoking cigarettes in 1979"    Social History   Substance and Sexual Activity  Alcohol Use Not Currently    Alcohol/week: 2.0 standard drinks of alcohol   Types: 2 Glasses of wine per week    Social History   Socioeconomic History   Marital status: Divorced    Spouse name: Not on file   Number of children: Not on file   Years of education: Not on file   Highest education level: Not on file  Occupational History   Occupation: retired  Tobacco Use   Smoking status: Former    Packs/day: 1.00    Years: 15.00    Additional pack years: 0.00    Total pack years: 15.00    Types: Cigarettes   Smokeless tobacco: Never   Tobacco comments:    "quit smoking cigarettes in 1979"  Vaping Use   Vaping Use: Never used  Substance and Sexual Activity   Alcohol use: Not Currently    Alcohol/week: 2.0 standard drinks of alcohol    Types: 2 Glasses of wine per week   Drug use: No   Sexual activity: Not Currently  Other Topics Concern   Not on file  Social History Narrative   Not on file   Social Determinants of Health   Financial Resource Strain: Not on file  Food Insecurity: No Food Insecurity (05/05/2022)   Hunger Vital Sign    Worried About Running Out of Food in the Last Year: Never true    Ran Out of Food in the Last Year: Never true  Transportation Needs: No Transportation Needs (05/05/2022)   PRAPARE - Administrator, Civil ServiceTransportation    Lack of Transportation (Medical): No    Lack of Transportation (Non-Medical): No  Physical Activity: Not on file  Stress: Not on file  Social Connections: Not on file  Intimate Partner Violence: Not At Risk (05/05/2022)   Humiliation, Afraid, Rape, and Kick questionnaire    Fear of Current or Ex-Partner: No    Emotionally Abused: No    Physically Abused: No    Sexually Abused: No    Allergies  Allergen Reactions   Asa [Aspirin] Other (See Comments)    Abdominal bleeding   Penicillins Anaphylaxis    Has patient had a PCN reaction causing immediate rash, facial/tongue/throat swelling, SOB or lightheadedness with hypotension: Yes Has patient had a PCN reaction  causing severe rash involving mucus membranes or skin necrosis: No Has patient had a PCN reaction that required hospitalization: No Has patient had a PCN reaction occurring within the last 10 years: No If all of the above answers are "NO", then may proceed with Cephalosporin use.   Statins Other (See Comments)    Stiff joints, muscle tightness, couldn't walk    Current Outpatient Medications  Medication Sig Dispense Refill   acetaminophen (TYLENOL) 500 MG tablet Take 1,000 mg by mouth as needed for moderate pain.     carvedilol (COREG) 3.125 MG tablet Take 1 tablet (3.125  mg total) by mouth 2 (two) times daily with a meal. 60 tablet 1   clopidogrel (PLAVIX) 75 MG tablet Take 1 tablet (75 mg total) by mouth daily. 30 tablet 3   ezetimibe (ZETIA) 10 MG tablet Take 1 tablet (10 mg total) by mouth daily. 30 tablet 1   Fe Fum-Vit C-Vit B12-FA (TRIGELS-F FORTE) CAPS capsule Take 1 capsule by mouth daily after breakfast. 30 capsule 2   LORazepam (ATIVAN) 0.5 MG tablet Take 0.5 mg by mouth as needed for anxiety.     METAMUCIL FIBER PO Take 1 Scoop by mouth as needed (constipation).     omega-3 acid ethyl esters (LOVAZA) 1 G capsule Take 1 g by mouth 2 (two) times daily.     OVER THE COUNTER MEDICATION Take 1 tablet by mouth in the morning and at bedtime. Beet Root chew     pantoprazole (PROTONIX) 40 MG tablet Take 1 tablet (40 mg total) by mouth daily. 30 tablet 11   zolpidem (AMBIEN) 10 MG tablet Take 1 tablet (10 mg total) by mouth at bedtime as needed. For sleep (Patient taking differently: Take 5-10 mg by mouth at bedtime as needed for sleep.) 30 tablet 5   No current facility-administered medications for this visit.     Family History  Problem Relation Age of Onset   Coronary artery disease Mother    Coronary artery disease Father        Physical Exam: Lungs: decreased both bases Card: RR with no murmurs Chest incision clean and dry and no instability on deep pressure Ext: trace  edema Neuro intact.     Diagnostic Studies & Laboratory data: I have personally reviewed the following studies and agree with the findings     Recent Radiology Findings:       Recent Lab Findings: Lab Results  Component Value Date   WBC 5.7 05/11/2022   HGB 8.8 (L) 05/11/2022   HCT 26.4 (L) 05/11/2022   PLT 178 05/11/2022   GLUCOSE 108 (H) 05/11/2022   CHOL 210 (H) 06/16/2019   TRIG 350 (H) 06/16/2019   HDL 38 (L) 06/16/2019   LDLCALC 112 (H) 06/16/2019   ALT 43 05/02/2022   AST 39 05/02/2022   NA 133 (L) 05/11/2022   K 4.3 05/11/2022   CL 101 05/11/2022   CREATININE 1.22 05/11/2022   BUN 28 (H) 05/11/2022   CO2 26 05/11/2022   TSH 1.661 01/08/2011   INR 1.6 (H) 05/08/2022   HGBA1C 7.0 (H) 05/06/2022      Assessment / Plan:     Almost 4 weeks sp CABG x 4. Doing well. Will get into Cardiac rehab and have assessed as exercises. He will try Tylenol for now moving forward for incisional pain. Will call if pain worsens. Has seen cardiology. Restrictions reviewed and will start driving after off narcotics. Pt pleased with care   I have spent 30 min in review of the records, viewing studies and in face to face with patient and in coordination of future care    Eugenio Hoes 06/01/2022 5:43 PM

## 2022-06-02 ENCOUNTER — Other Ambulatory Visit: Payer: Self-pay | Admitting: *Deleted

## 2022-06-02 ENCOUNTER — Ambulatory Visit (INDEPENDENT_AMBULATORY_CARE_PROVIDER_SITE_OTHER): Payer: 59 | Admitting: Thoracic Surgery (Cardiothoracic Vascular Surgery)

## 2022-06-02 ENCOUNTER — Encounter: Payer: Self-pay | Admitting: Thoracic Surgery (Cardiothoracic Vascular Surgery)

## 2022-06-02 VITALS — BP 159/83 | HR 86 | Resp 20 | Ht 67.0 in | Wt 178.0 lb

## 2022-06-02 DIAGNOSIS — Z951 Presence of aortocoronary bypass graft: Secondary | ICD-10-CM

## 2022-06-02 NOTE — Progress Notes (Signed)
Ambulatory referral placed to [facility] per Dr. Leafy Ro.

## 2022-06-03 ENCOUNTER — Other Ambulatory Visit: Payer: Self-pay | Admitting: Surgical

## 2022-06-04 ENCOUNTER — Telehealth: Payer: Self-pay | Admitting: Cardiovascular Disease

## 2022-06-04 NOTE — Telephone Encounter (Signed)
Returned call to patient, he states he continues to have pain in his leg incision following CABGx4 on 05/08/22.  Patient states cardiothoracic surgeon has released him and will not refill oxycodone. Patient was told to contact our office for Rx for pain medication.  Informed patient our office typically does not prescribe pain medication, this is managed by surgeon's office or may need to reach out to PCP.  Patient would like to know if Dr. Eden Emms would be willing to send in Rx for oxycodone 5mg  Q6H PRN x1 week. Patient has F/U appt at our office with Robin Searing, NP on 06/12/22.  Will forward to Dr. Eden Emms to review and advise.

## 2022-06-04 NOTE — Telephone Encounter (Signed)
Pt states that he received oxycodone on 3/28, after his cabg. He states he was told that if he needed more, to see his cardiologist. He states he is out, but has still been in pain. Please advise. He states if allowed, it can be sent to the following pharmacy.   [id number] - [place], [place] - [address]

## 2022-06-05 NOTE — Telephone Encounter (Signed)
Spoke to the patient, per Dr. Eden Emms : I don't prescribe narcotics    Patient voiced understanding

## 2022-06-11 NOTE — Progress Notes (Unsigned)
Office Visit    Patient Name: [name] Date of Encounter: 06/11/2022  Primary Care Provider:  Alysia Penna, MD Primary Cardiologist:  Charlton Haws, MD Primary Electrophysiologist: None  Chief Complaint    [name] is a 77 y.o. male with PMH of CAD with several PCI's from anterior MI with in-stent restenosis 09/2017, ICM, HLD, arthritis, anxiety, CABG x 4, GI bleed s/p clipping for duodenal erosion who presents today for post bypass follow-up. Past Medical History    Past Medical History:  Diagnosis Date   Anxiety    Arthritis    "wrists" (07/27/2014)   Coronary artery disease    a.  Pt with 4 stents over the course of 2003-2012;  b.  LHC 6/16:  LAD, RCA, LCx stents patent, OM1 50%, EF 50-55% >> med rx    Elevated LFTs    GERD (gastroesophageal reflux disease)    Hypercholesterolemia    Hypertension    Ischemic cardiomyopathy    cMRI 03/2011:  EF 48% with dist ant, septal, apical and inf-apical dyskinesia, no apical clot, dist ant, apical and septal full thickness scar >> No ICD needed  //  Limited echo 7/19: Large apical septal aneurysm, severe hypokinesis of the entire apex and mid to apical segments of the anterior wall, anterolateral wall and anterior septum-consistent with infarct and most of the LAD territory; EF 35    Left ventricular aneurysm    Limited echo 7/19: Large apical septal aneurysm, severe hypokinesis of the entire apex and mid to apical segments of the anterior wall, anterolateral wall and anterior septum-consistent with infarct and most of the LAD territory; EF 35   Myocardial infarction 2003   Past Surgical History:  Procedure Laterality Date   CARDIAC CATHETERIZATION  06/11/2012   Nonobstructive CAD, patent stents, EF 50%, mild apical dyskinesis   CARDIAC CATHETERIZATION N/A 07/28/2014   Procedure: Left Heart Cath and Coronary Angiography;  Surgeon: Lennette Bihari, MD;  Location: [facility] INVASIVE CV LAB;  Service: Cardiovascular;  Laterality: N/A;    CORONARY ANGIOPLASTY WITH STENT PLACEMENT  2003-2012    total of 4 stents place   CORONARY ARTERY BYPASS GRAFT N/A 05/08/2022   Procedure: CORONARY ARTERY BYPASS GRAFTING (CABG) x4, USING LEFT INTERNAL MAMMARY ARTERY AND ENDOSCOPICALLY HARVESTED RIGHT GREATER SAPHENOUS VEIN;  Surgeon: Eugenio Hoes, MD;  Location: [facility] OR;  Service: Open Heart Surgery;  Laterality: N/A;   CORONARY STENT INTERVENTION N/A 10/20/2017   Procedure: CORONARY STENT INTERVENTION;  Surgeon: Tonny Bollman, MD;  Location: [facility] INVASIVE CV LAB;  Service: Cardiovascular;  Laterality: N/A;   COSMETIC SURGERY Left 1970's   "dog ripped of my ear"   LEFT HEART CATH AND CORONARY ANGIOGRAPHY N/A 10/20/2017   Procedure: LEFT HEART CATH AND CORONARY ANGIOGRAPHY;  Surgeon: Tonny Bollman, MD;  Location: [facility] INVASIVE CV LAB;  Service: Cardiovascular;  Laterality: N/A;   LEFT HEART CATH AND CORONARY ANGIOGRAPHY N/A 05/02/2022   Procedure: LEFT HEART CATH AND CORONARY ANGIOGRAPHY;  Surgeon: Marykay Lex, MD;  Location: [facility] INVASIVE CV LAB;  Service: Cardiovascular;  Laterality: N/A;   LEFT HEART CATHETERIZATION WITH CORONARY ANGIOGRAM N/A 06/11/2012   Procedure: LEFT HEART CATHETERIZATION WITH CORONARY ANGIOGRAM;  Surgeon: Peter M Swaziland, MD;  Location: [facility] CATH LAB;  Service: Cardiovascular;  Laterality: N/A;   TEE WITHOUT CARDIOVERSION N/A 05/08/2022   Procedure: TRANSESOPHAGEAL ECHOCARDIOGRAM;  Surgeon: Eugenio Hoes, MD;  Location: [facility] OR;  Service: Open Heart Surgery;  Laterality: N/A;    Allergies  Allergies  Allergen  Reactions   Asa [Aspirin] Other (See Comments)    Abdominal bleeding   Penicillins Anaphylaxis    Has patient had a PCN reaction causing immediate rash, facial/tongue/throat swelling, SOB or lightheadedness with hypotension: Yes Has patient had a PCN reaction causing severe rash involving mucus membranes or skin necrosis: No Has patient had a PCN reaction that required hospitalization: No Has patient had a PCN  reaction occurring within the last 10 years: No If all of the above answers are "NO", then may proceed with Cephalosporin use.   Statins Other (See Comments)    Stiff joints, muscle tightness, couldn't walk    History of Present Illness    [name]  is a 77 year old male with the above mention past medical history who presents today for post bypass follow-up.  [name] has an extensive cardiac history with multiple PCI's due to recurrent MIs.  He presented to the ED by EMS after experiencing left-sided chest pressure that was 6 out of 10.  Pain resolved with nitro and arrival and EKG showed lateral infarct concerning for NSTEMI.  Patient was admitted and troponins continue to increase [id number].  Patient underwent LHC that revealed poor targets for PCI and recommendation for CABG x 4  that was completed on 05/08/2022. LIMA to the LAD and RSVG from the aorta to the PDA and from the aorta to the OM1 sequenced to OM2.   He did have some postop volume overload and was treated with a course of diuretics.  He had a follow-up 05/22/2022 and was doing well with occasional bouts of shortness of breath with exertion. He was instructed to continue with sternal precautions (no more than lifting 10 pounds) for at least 8 weeks.  He was seen by Dr. Karma Lew with incisional pain.  He was advised to trial Tylenol.  Since last being seen in the office patient reports***.  Patient denies chest pain, palpitations, dyspnea, PND, orthopnea, nausea, vomiting, dizziness, syncope, edema, weight gain, or early satiety.     ***Notes:  Home Medications    Current Outpatient Medications  Medication Sig Dispense Refill   acetaminophen (TYLENOL) 500 MG tablet Take 1,000 mg by mouth as needed for moderate pain.     carvedilol (COREG) 3.125 MG tablet Take 1 tablet (3.125 mg total) by mouth 2 (two) times daily with a meal. 60 tablet 1   clopidogrel (PLAVIX) 75 MG tablet Take 1 tablet (75 mg total) by mouth daily. 30  tablet 3   ezetimibe (ZETIA) 10 MG tablet Take 1 tablet (10 mg total) by mouth daily. 30 tablet 1   Fe Fum-Vit C-Vit B12-FA (TRIGELS-F FORTE) CAPS capsule Take 1 capsule by mouth daily after breakfast. 30 capsule 2   LORazepam (ATIVAN) 0.5 MG tablet Take 0.5 mg by mouth as needed for anxiety.     METAMUCIL FIBER PO Take 1 Scoop by mouth as needed (constipation).     omega-3 acid ethyl esters (LOVAZA) 1 G capsule Take 1 g by mouth 2 (two) times daily.     OVER THE COUNTER MEDICATION Take 1 tablet by mouth in the morning and at bedtime. Beet Root chew     pantoprazole (PROTONIX) 40 MG tablet Take 1 tablet (40 mg total) by mouth daily. 30 tablet 11   zolpidem (AMBIEN) 10 MG tablet Take 1 tablet (10 mg total) by mouth at bedtime as needed. For sleep (Patient taking differently: Take 5-10 mg by mouth at bedtime as needed for sleep.) 30 tablet 5  No current facility-administered medications for this visit.     Review of Systems  Please see the history of present illness.    (+)*** (+)***  All other systems reviewed and are otherwise negative except as noted above.  Physical Exam    Wt Readings from Last 3 Encounters:  06/02/22 178 lb (80.7 kg)  05/12/22 184 lb 1.6 oz (83.5 kg)  03/14/22 193 lb 3.2 oz (87.6 kg)   ZO:XWRUE were no vitals filed for this visit.,There is no height or weight on file to calculate BMI.  Constitutional:      Appearance: Healthy appearance. Not in distress.  Neck:     Vascular: JVD normal.  Pulmonary:     Effort: Pulmonary effort is normal.     Breath sounds: No wheezing. No rales. Diminished in the bases Cardiovascular:     Normal rate. Regular rhythm. Normal S1. Normal S2.      Murmurs: There is no murmur.  Edema:    Peripheral edema absent.  Abdominal:     Palpations: Abdomen is soft non tender. There is no hepatomegaly.  Skin:    General: Skin is warm and dry.  Neurological:     General: No focal deficit present.     Mental Status: Alert and  oriented to person, place and time.     Cranial Nerves: Cranial nerves are intact.  EKG/LABS/ Recent Cardiac Studies    ECG personally reviewed by me today - ***  Risk Assessment/Calculations:   {Does this patient have ATRIAL FIBRILLATION?:[phone]}        Lab Results  Component Value Date   WBC 5.7 05/11/2022   HGB 8.8 (L) 05/11/2022   HCT 26.4 (L) 05/11/2022   MCV 94.6 05/11/2022   PLT 178 05/11/2022   Lab Results  Component Value Date   CREATININE 1.22 05/11/2022   BUN 28 (H) 05/11/2022   NA 133 (L) 05/11/2022   K 4.3 05/11/2022   CL 101 05/11/2022   CO2 26 05/11/2022   Lab Results  Component Value Date   ALT 43 05/02/2022   AST 39 05/02/2022   ALKPHOS 40 05/02/2022   BILITOT 0.8 05/02/2022   Lab Results  Component Value Date   CHOL 210 (H) 06/16/2019   HDL 38 (L) 06/16/2019   LDLCALC 112 (H) 06/16/2019   TRIG 350 (H) 06/16/2019   CHOLHDL 5.5 (H) 06/16/2019    Lab Results  Component Value Date   HGBA1C 7.0 (H) 05/06/2022    Cardiac Studies & Procedures   CARDIAC CATHETERIZATION  CARDIAC CATHETERIZATION 05/03/2022  Narrative   Prox LAD lesion is 75% stenosed - just prior to stent   Previously placed Mid LAD to Dist LAD stent of unknown type is  widely patent.   Prox Cx to Mid Cx lesion is 65% stenosed -just proximal to prior stent   Previously placed Prox RCA to Mid RCA stent of unknown type is  widely patent.   Previously placed Mid Cx to Dist Cx stent is 5% stenosed with 90% stenosed side branch in 2nd Mrg.   Mid RCA lesion is 80% stenosed - just distal to stent.  Mid RCA to Dist RCA lesion is 65% stenosed.  Dist RCA lesion is 80% stenosed.   -----------------------------------------------   There is moderate left ventricular systolic dysfunction.  The left ventricular ejection fraction is 35-45% by visual estimate.   LV end diastolic pressure is severely elevated.   There is no aortic valve stenosis.  POST-OP DIAGNOSES Severe Multi-Vessel  CAD: Prox LAD 75%@ SP1 (just prior to extensive stented segment) Prox-mid LCx ~65% (prox stent edge lesion) with progression of jailed OM2 lesion to 90% (likely Culprit) Patent Prox to mid RCA stents with sequential focal lesions 80%-60-70% lesions & 80%  Known to be moderate Ischemic CM - EG ~ 40% (unable to assess regional wall motion abnormalities due to poor filling. Recommend 2D echo to better assess)   PLAN Unsure what the best option here is.  Clearly all the lesion segments are treatable with PCI with exception of potentially the OM branch which is jailed.  Complete revascularization with CABG is also an option.  At this point, the best plan is to temporize medically and discussed with interventional colleagues as well as CVTS consultation (will need to be called in AM by rounding MD). Treat with a short course of Aggrastat (6 hours) for potential thrombotic disease in OM2. RCA and LAD given his ongoing pain. Restart heparin 6 to 8 hours after sheath removal which would be after Aggrastat completion. Hold clopidogrel Check 2D echo to better assess EF Continue IV nitroglycerin infusion and will hold Entresto for now defer to rounding team based on blood pressure to determine whether will be restarted.  We will continue carvedilol.    Bryan Lemma  Findings Coronary Findings Diagnostic  Dominance: Right  Left Main Vessel is large.  Left Anterior Descending Prox LAD lesion is 75% stenosed. The lesion is focal and eccentric. Previously placed Mid LAD to Dist LAD stent of unknown type is  widely patent. The lesion is segmental.  First Septal Branch Vessel is small in size.  Left Circumflex Vessel is large. Prox Cx to Mid Cx lesion is 65% stenosed. Mid Cx to Dist Cx lesion is 5% stenosed with 90% stenosed side branch in 2nd Mrg. The lesion was previously treated using a stent (unknown type) over 2 years ago.  First Obtuse Marginal Branch Vessel is small in size.  Left  Posterior Atrioventricular Artery Vessel is small in size.  Right Coronary Artery Vessel was injected. Vessel is large. Previously placed Prox RCA to Mid RCA stent of unknown type is  widely patent. Mid RCA lesion is 80% stenosed. The lesion is eccentric and irregular. Cannot exclude either calcific or thrombotic lesion Mid RCA to Dist RCA lesion is 65% stenosed. Dist RCA lesion is 80% stenosed.  Acute Marginal Branch Vessel is small in size.  Right Posterior Atrioventricular Artery Vessel is large in size.  First Right Posterolateral Branch Vessel is small in size.  Second Right Posterolateral Branch Vessel is moderate in size.  Intervention  No interventions have been documented.   CARDIAC CATHETERIZATION  CARDIAC CATHETERIZATION 10/20/2017  Narrative  Ost 1st Mrg to 1st Mrg lesion is 75% stenosed.  Non-stenotic Ost RCA to Mid RCA lesion was previously treated.  Mid RCA to Dist RCA lesion is 40% stenosed.  Prox Cx to Mid Cx lesion is 30% stenosed.  Prox LAD to Mid LAD lesion is 80% stenosed.  A drug-eluting stent was successfully placed using a STENT RESOLUTE ONYX 2.5X38.  Post intervention, there is a 0% residual stenosis.  1.  Multivessel coronary artery disease with continued patency of the stented segment in the RCA, continued patency of the stented segment in the left circumflex with mild in-stent restenosis and severe stenosis of a small obtuse marginal branch not suitable for PCI, and severe diffuse stenosis of the proximal and mid LAD treated successfully with overlapping resolute Onyx drug-eluting stents 2.  Known  severe LV systolic dysfunction  Recommendation: The patient should continue on clopidogrel alone.  He is intolerant to aspirin.  I would favor long-term clopidogrel since he is intolerant to aspirin and has been treated with multiple stents.  Recommend dual antiplatelet therapy with Clopidogrel 75mg  daily long-term (beyond 12 months) because of  multiple stents, severe LV dysfunction, ASA intolerance.  Findings Coronary Findings Diagnostic  Dominance: Right  Left Main Vessel is normal in caliber. Vessel is angiographically normal. The left main is patent with no stenosis.  Left Anterior Descending Prox LAD to Mid LAD lesion is 80% stenosed. The lesion was previously treated. The LAD has severe diffuse stenosis throughout the proximal and mid segment.  The intervening segment between the first and second diagonal branches have 70% stenosis, just at the second diagonal branch there is a tight 80 to 90% eccentric stenosis within the stented segment of the LAD.  Beyond the stented segment of LAD there is severe diffuse stenosis extending into the mid vessel of 80%.  The apical LAD is widely patent.  Left Circumflex Prox Cx to Mid Cx lesion is 30% stenosed. The lesion is located at the bend. The lesion was previously treated.  First Obtuse Marginal Branch Vessel is small in size. The proximal circumflex is calcified with mild nonobstructive stenosis of 20 to 30%.  The mid vessel is patent throughout the stented segment with 20 to 30% in-stent restenosis.  The first obtuse marginal is small in caliber with a 75% eccentric stenosis not significantly changed from the previous study. Ost 1st Mrg to 1st Mrg lesion is 75% stenosed. The lesion is segmental. Small vessel  Right Coronary Artery Non-stenotic Ost RCA to Mid RCA lesion was previously treated. Mid RCA to Dist RCA lesion is 40% stenosed. Diffuse nonobstructive stenosis is present unchanged from the previous study.  The stented segment in the proximal to mid vessel is widely patent with no restenosis.  Intervention  Prox LAD to Mid LAD lesion Stent CATHETER LAUNCHER 6FREBU 3.5 guide catheter was inserted. Lesion crossed with guidewire using a WIRE COUGAR XT STRL 190CM. Pre-stent angioplasty was performed using a BALLOON EMERGE MR 2.5X15. A drug-eluting stent was successfully placed  using a STENT RESOLUTE ONYX 2.5X38. Post-stent angioplasty was performed using a BALLOON [place] EMERGE MR 2.75X20. Heparin is used for anti-coagulation.  A therapeutic ACT greater than 300 seconds is achieved.  The patient is been maintained on long-term clopidogrel.  He is intolerant to aspirin with previous history of severe GI bleeding.  An EBU guide catheter is used and a Administrator, arts is advanced without difficulty into the apical LAD.  The long lesion is predilated with a 2.5 x 15 mm balloon to high pressure.  The segment of disease beyond the stented segment is stented with a 2.5 x 38 mm resolute Onyx DES deployed at 14 atm.  That segment is postdilated with a 2.75 mm noncompliant balloon to 18 atm.  There is severe proximal stenosis off the proximal edge of the stent in that area is predilated to high pressure with the 2.75 mm noncompliant balloon and then stented with a 2.75 x 18 mm resolute Onyx DES deployed at 16 atm and postdilated with a 3.0 mm noncompliant balloon to 18 atm. Post-Intervention Lesion Assessment The intervention was successful. Pre-interventional TIMI flow is 3. Post-intervention TIMI flow is 3. No complications occurred at this lesion. There is a 0% residual stenosis post intervention.     ECHOCARDIOGRAM  ECHOCARDIOGRAM COMPLETE 05/03/2022  Narrative ECHOCARDIOGRAM  REPORT    Patient Name:   KORBYN CHOPIN Date of Exam: 05/03/2022 Medical Rec #:  [id number]      Height:       67.0 in Accession #:    [id number]     Weight:       180.6 lb Date of Birth:  17-Mar-1945      BSA:          1.936 m Patient Age:    76 years       BP:           147/66 mmHg Patient Gender: M              HR:           72 bpm. Exam Location:  Inpatient  Procedure: 2D Echo, Cardiac Doppler, Color Doppler and Intracardiac Opacification Agent  Indications:    CAD Native Vessel I25.10  History:        Patient has prior history of Echocardiogram examinations, most recent 01/20/2022. Cardiomyopathy,  NSTEMI, Previous Myocardial Infarction and CAD; Risk Factors:Hypertension and Former Smoker.  Sonographer:    Aron Baba Referring Phys: [id number] DAVID W HARDING   Sonographer Comments: Suboptimal parasternal window. Image acquisition challenging due to patient body habitus and Image acquisition challenging due to respiratory motion. IMPRESSIONS   1. 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 There is no visualized apical thrombus. . Left ventricular ejection fraction, by estimation, is 50%. The left ventricle has normal function. Left ventricular endocardial border not optimally defined to evaluate regional wall motion. Left ventricular diastolic parameters are consistent with Grade I diastolic dysfunction (impaired relaxation). 2. Right ventricular systolic function is normal. The right ventricular size is normal. Tricuspid regurgitation signal is inadequate for assessing PA pressure. 3. The mitral valve is normal in structure. Trivial mitral valve regurgitation. Mild to moderate mitral stenosis. 4. The aortic valve has an indeterminant number of cusps. Aortic valve regurgitation is moderate. No aortic stenosis is present. 5. Aortic dilatation noted. 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 6. The inferior vena cava is normal in size with <50% respiratory variability, suggesting right atrial pressure of 8 mmHg.  FINDINGS Left Ventricle: The apex is akinetic and mildly aneurysmal. There is no visualized apical thrombus. Left ventricular ejection fraction, by estimation, is 50%. The left ventricle has normal function. Left ventricular endocardial border not optimally defined to evaluate regional wall motion. Definity contrast agent was given IV to delineate the left ventricular endocardial borders. The left ventricular internal cavity size was normal in size. There is no left ventricular hypertrophy. Left ventricular diastolic parameters are consistent with Grade I  diastolic dysfunction (impaired relaxation). Normal left ventricular filling pressure.  Right Ventricle: The right ventricular size is normal. Right vetricular wall thickness was not well visualized. Right ventricular systolic function is normal. Tricuspid regurgitation signal is inadequate for assessing PA pressure.  Left Atrium: Left atrial size was normal in size.  Right Atrium: Right atrial size was normal in size.  Pericardium: There is no evidence of pericardial effusion.  Mitral Valve: The mitral valve is normal in structure. There is mild thickening of the mitral valve leaflet(s). There is mild calcification of the mitral valve leaflet(s). Mild mitral annular calcification. Trivial mitral valve regurgitation. Mild to moderate mitral valve stenosis. MV peak gradient, 13.8 mmHg. The mean mitral valve gradient is 6.0 mmHg.  Tricuspid Valve: The tricuspid valve is normal in structure. Tricuspid valve regurgitation is not demonstrated. No evidence of tricuspid  stenosis.  Aortic Valve: The aortic valve has an indeterminant number of cusps. Aortic valve regurgitation is moderate. Aortic regurgitation PHT measures 759 msec. No aortic stenosis is present. Aortic valve mean gradient measures 4.9 mmHg. Aortic valve peak gradient measures 11.3 mmHg. Aortic valve area, by VTI measures 2.19 cm.  Pulmonic Valve: The pulmonic valve was not well visualized. Pulmonic valve regurgitation is not visualized. No evidence of pulmonic stenosis.  Aorta: The aortic root is normal in size and structure and aortic dilatation noted. 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  Venous: The inferior vena cava is normal in size with less than 50% respiratory variability, suggesting right atrial pressure of 8 mmHg.  IAS/Shunts: No atrial level shunt detected by color flow Doppler.   LEFT VENTRICLE PLAX 2D LVIDd:         3.90 cm   Diastology LVIDs:         2.70 cm   LV e' medial:    4.90  cm/s LV PW:         1.00 cm   LV E/e' medial:  19.7 LV IVS:        0.90 cm   LV e' lateral:   7.29 cm/s LVOT diam:     1.90 cm   LV E/e' lateral: 13.3 LV SV:         65 LV SV Index:   34 LVOT Area:     2.84 cm   RIGHT VENTRICLE RV S prime:     9.60 cm/s TAPSE (M-mode): 2.2 cm  LEFT ATRIUM             Index        RIGHT ATRIUM           Index LA diam:        3.20 cm 1.65 cm/m   RA Area:     18.55 cm LA Vol (A2C):   72.3 ml 37.34 ml/m  RA Volume:   52.20 ml  26.96 ml/m LA Vol (A4C):   53.0 ml 27.37 ml/m LA Biplane Vol: 62.9 ml 32.49 ml/m AORTIC VALVE                     PULMONIC VALVE AV Area (Vmax):    1.83 cm      PR End Diast Vel: 3.11 msec AV Area (Vmean):   2.07 cm AV Area (VTI):     2.19 cm AV Vmax:           168.44 cm/s AV Vmean:          100.239 cm/s AV VTI:            0.296 m AV Peak Grad:      11.3 mmHg AV Mean Grad:      4.9 mmHg LVOT Vmax:         109.00 cm/s LVOT Vmean:        73.200 cm/s LVOT VTI:          0.229 m LVOT/AV VTI ratio: 0.77 AI PHT:            759 msec  AORTA Ao Root diam: 3.80 cm Ao Asc diam:  3.70 cm  MITRAL VALVE MV Area (PHT): 3.39 cm     SHUNTS MV Area VTI:   1.11 cm     Systemic VTI:  0.23 m MV Peak grad:  13.8 mmHg    Systemic Diam: 1.90 cm MV Mean grad:  6.0 mmHg  MV Vmax:       1.86 m/s MV Vmean:      109.0 cm/s MV Decel Time: 224 msec MV E velocity: 96.60 cm/s MV A velocity: 150.00 cm/s MV E/A ratio:  0.64  Dina Rich MD Electronically signed by Dina Rich MD Signature Date/Time: 05/03/2022/2:30:26 PM    Final   TEE  ECHO INTRAOPERATIVE TEE 05/08/2022  Narrative *INTRAOPERATIVE TRANSESOPHAGEAL REPORT *    Patient Name:   CAMRY THEISS Date of Exam: 05/08/2022 Medical Rec #:  [id number]      Height:       67.0 in Accession #:    [id number]     Weight:       185.8 lb Date of Birth:  [birth year]      BSA:          1.96 m Patient Age:    76 years       BP:           122/54 mmHg Patient Gender: M               HR:           47 bpm. Exam Location:  Anesthesiology  Transesophogeal exam was perform intraoperatively during surgical procedure. Patient was closely monitored under general anesthesia during the entirety of examination.  Indications:     CABG Performing Phys: [id number] Eugenio Hoes  Complications: No known complications during this procedure. POST-OP IMPRESSIONS _ Left Ventricle: The left ventricle is unchanged from pre-bypass. _ Right Ventricle: The right ventricle appears unchanged from pre-bypass. _ Aorta: The aorta appears unchanged from pre-bypass. _ Left Atrium: The left atrium appears unchanged from pre-bypass. _ Left Atrial Appendage: The left atrial appendage appears unchanged from pre-bypass. _ Aortic Valve: The aortic valve appears unchanged from pre-bypass. _ Mitral Valve: The mitral valve appears unchanged from pre-bypass. _ Tricuspid Valve: The tricuspid valve appears unchanged from pre-bypass. _ Pulmonic Valve: The pulmonic valve appears unchanged from pre-bypass. _ Interatrial Septum: The interatrial septum appears unchanged from pre-bypass. _ Interventricular Septum: The interventricular septum appears unchanged from pre-bypass. _ Pericardium: The pericardium appears unchanged from pre-bypass. _ Comments: Good LV function post bypass No RWMA, valves as before.  PRE-OP FINDINGS Left Ventricle: The left ventricle has normal systolic function, with an ejection fraction of 55-60%. The cavity size was normal. There is no increase in left ventricular wall thickness. No evidence of left ventricular regional wall motion abnormalities. There is borderline concentric left ventricular hypertrophy of the inferior and anterior segments. Left ventricular diastolic parameters are consistent with Grade I diastolic dysfunction (impaired relaxation).   Right Ventricle: The right ventricle has normal systolic function. The cavity was normal. There is no increase in right  ventricular wall thickness.  Left Atrium: Left atrial size was normal in size. No left atrial/left atrial appendage thrombus was detected. The left atrial appendage is well visualized and there is no evidence of thrombus present.  Right Atrium: Right atrial size was normal in size.  Interatrial Septum: No atrial level shunt detected by color flow Doppler. Agitated saline contrast was given intravenously to evaluate for intracardiac shunting. There is no evidence of a patent foramen ovale.  Pericardium: There is no evidence of pericardial effusion.  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 Mitral valve regurgitation is mild by color flow Doppler. The MR jet is centrally-directed. 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 There is moderate thickening and moderate calcification present on the mitral valve P3 cusp with mildly decreased mobility and there  is moderate thickening and moderate calcification present on the mitral valve A1 cusp with normal mobility.  Tricuspid Valve: The tricuspid valve was normal in structure. Tricuspid valve regurgitation was not visualized by color flow Doppler. No evidence of tricuspid stenosis is present.  Aortic Valve: The aortic valve is normal in structure. Aortic valve regurgitation is mild by color flow Doppler. The jet is centrally-directed. There is no stenosis of the aortic valve. There is mild thickening and mild calcification present on the aortic valve right coronary cusp with normal mobility and there is mild thickening and mild calcification present on the aortic valve left coronary cusp with normal mobility and there is moderate thickening and moderate calcification present on the aortic valve non-coronary cusp with normal mobility.  Pulmonic Valve: The pulmonic valve was normal in structure, with normal. Pulmonic valve regurgitation is mild by color flow Doppler.   Aorta: The aortic root and ascending aorta are normal in size and structure. There is  borderline dilatation of the aortic root, measuring 37 mm.  Shunts: There is no evidence of an atrial septal defect.  +--------------+--------++ LEFT VENTRICLE         +--------------+--------++ PLAX 2D                +--------------+--------++ LVIDd:        5.30 cm  +--------------+--------++ LVIDs:        3.50 cm  +--------------+--------++ LVOT diam:    2.30 cm  +--------------+--------++ LV SV:        84 ml    +--------------+--------++ LV SV Index:  41.85    +--------------+--------++ LVOT Area:    4.15 cm +--------------+--------++                        +--------------+--------++  +------------+--------++ AORTIC VALVE         +------------+--------++ AR PHT:     799 msec +------------+--------++  +-------------+-------++ AORTA                +-------------+-------++ Ao Root diam:3.70 cm +-------------+-------++   +--------------+-------+ SHUNTS                +--------------+-------+ Systemic Diam:2.30 cm +--------------+-------+   Eilene Ghazi MD Electronically signed by Eilene Ghazi MD Signature Date/Time: 05/08/2022/4:47:15 PM    Final     CARDIAC MRI  MR CARDIAC MORPHOLOGY W WO CONTRAST 12/25/2017  Narrative CLINICAL DATA:  Cardiomyopathy  EXAM: CARDIAC MRI  TECHNIQUE: The patient was scanned on a 1.5 Tesla GE magnet. A dedicated cardiac coil was used. Functional imaging was done using Fiesta sequences. 2,3, and 4 chamber views were done to assess for RWMA's. Modified Simpson's rule using a short axis stack was used to calculate an ejection fraction on a dedicated work [employer]. The patient received 9 cc of Multihance. After 10 minutes inversion recovery sequences were used to assess for infiltration and scar tissue.  CONTRAST:  9 cc Gadavist  FINDINGS: There was mild LAE. Normal RA/RV. The aortic root was normal at 3.3 cm. The AV was tri leaflet with  moderate appearing central AR. The MV was normal. There was a trivial pericardial effusion Quantitative EF was 46% (EDV 142 ESV 77 SV 66) The distal septum, anterior wall apex and inferior apex were akinetic. There was no mural apical thrombus. Delayed enhancement images showed full thickness scar and thinning of the distal septum, anterior wall apex and inferior apex  IMPRESSION: 1. Mild LVE with thinned and akinetic  distal septum, anterior wall apex and inferior apex EF 46%  2. Full thickness scar involving distal septum, anterior wall, apex and inferior apex  3. Tri- leaflet AV with normal aortic root 3.3 cm moderate appearing central AR  4.  Normal RV size and function  5.  Trivial pericardial effusion  6.  Mild LAE  Charlton Haws   Electronically Signed By: Charlton Haws M.D. On: 12/25/2017 14:12         Assessment & Plan    1.CAD/CABG x 4: -Patient had complaint of chest pain 1 week prior to admission found to have three-vessel CAD with no targets for PCI.  He underwent CABG x 4 that was successful. -He was seen last by Dr. Leafy Ro on 4/8 he was cleared for cardiac rehab and was advised to try Tylenol for pain moving forward.  He was given clearance to drive after coming off of narcotics. -Today patient reports*** -Continue GDMT with***  2.  Chronic combined CHF: -Patient's last 2D echo was 50% on 04/2022 -Today patient reports*** -Continue GDMT with***  3.  HLD: -Patient's last LDL cholesterol was*** -Patient is statin intolerant and will require outpatient PCSK9 inhibitor therapy  4.  DM type II: -Patient's last hemoglobin A1c was*** -Continue***  {The patient has an active order for outpatient cardiac rehabilitation.   Please indicate if the patient is ready to start. Do NOT delete this.  It will auto delete.  Refresh note, then sign.              Click here to document readiness and see contraindications.  :1}  Cardiac Rehabilitation Eligibility  Assessment       Disposition: Follow-up with Charlton Haws, MD or APP in *** months {Are you ordering a CV Procedure (e.g. stress test, cath, DCCV, TEE, etc)?   Press F2        :[id number]}   Medication Adjustments/Labs and Tests Ordered: Current medicines are reviewed at length with the patient today.  Concerns regarding medicines are outlined above.   Signed, Napoleon Form, Leodis Rains, NP 06/11/2022, 8:41 AM [facility]  Note:  This document was prepared using Dragon voice recognition software and may include unintentional dictation errors.

## 2022-06-12 ENCOUNTER — Ambulatory Visit: Payer: 59 | Attending: Nurse Practitioner | Admitting: Nurse Practitioner

## 2022-06-12 ENCOUNTER — Encounter: Payer: Self-pay | Admitting: Nurse Practitioner

## 2022-06-12 VITALS — BP 118/60 | HR 74 | Ht 67.0 in | Wt 176.0 lb

## 2022-06-12 DIAGNOSIS — E785 Hyperlipidemia, unspecified: Secondary | ICD-10-CM | POA: Diagnosis not present

## 2022-06-12 DIAGNOSIS — I2510 Atherosclerotic heart disease of native coronary artery without angina pectoris: Secondary | ICD-10-CM | POA: Diagnosis not present

## 2022-06-12 DIAGNOSIS — Z9861 Coronary angioplasty status: Secondary | ICD-10-CM

## 2022-06-12 DIAGNOSIS — Z951 Presence of aortocoronary bypass graft: Secondary | ICD-10-CM | POA: Diagnosis not present

## 2022-06-12 DIAGNOSIS — N1831 Chronic kidney disease, stage 3a: Secondary | ICD-10-CM

## 2022-06-12 DIAGNOSIS — E119 Type 2 diabetes mellitus without complications: Secondary | ICD-10-CM

## 2022-06-12 NOTE — Patient Instructions (Signed)
Medication Instructions:  Your physician recommends that you continue on your current medications as directed. Please refer to the Current Medication list given to you today. *If you need a refill on your cardiac medications before your next appointment, please call your pharmacy*   Lab Work: None ordered   Testing/Procedures: None ordered   Follow-Up: At [facility], you and your health needs are our priority.  As part of our continuing mission to provide you with exceptional heart care, we have created designated Provider Care Teams.  These Care Teams include your primary Cardiologist (physician) and Advanced Practice Providers (APPs -  Physician Assistants and Nurse Practitioners) who all work together to provide you with the care you need, when you need it.  We recommend signing up for the patient portal called "MyChart".  Sign up information is provided on this After Visit Summary.  MyChart is used to connect with patients for Virtual Visits (Telemedicine).  Patients are able to view lab/test results, encounter notes, upcoming appointments, etc.  Non-urgent messages can be sent to your provider as well.   To learn more about what you can do with MyChart, go to [email or web address].    Your next appointment:   3 month(s)  Provider:   Charlton Haws, MD  or Robin Searing, NP   Other Instructions

## 2022-06-12 NOTE — Progress Notes (Signed)
Office Visit    Patient Name: [name] Date of Encounter: 06/12/2022  Primary Care Provider:  Alysia Penna, MD Primary Cardiologist:  Charlton Haws, MD Primary Electrophysiologist: None  Chief Complaint    [name] is a 77 y.o. male with PMH of CAD with several PCI's from anterior MI with in-stent restenosis 09/2017, ICM, HLD, arthritis, anxiety, CABG x 4, GI bleed s/p clipping for duodenal erosion who presents today for post bypass follow-up.  Past Medical History    Past Medical History:  Diagnosis Date   Anxiety    Arthritis    "wrists" (07/27/2014)   Coronary artery disease    a.  Pt with 4 stents over the course of 2003-2012;  b.  LHC 6/16:  LAD, RCA, LCx stents patent, OM1 50%, EF 50-55% >> med rx    Elevated LFTs    GERD (gastroesophageal reflux disease)    Hypercholesterolemia    Hypertension    Ischemic cardiomyopathy    cMRI 03/2011:  EF 48% with dist ant, septal, apical and inf-apical dyskinesia, no apical clot, dist ant, apical and septal full thickness scar >> No ICD needed  //  Limited echo 7/19: Large apical septal aneurysm, severe hypokinesis of the entire apex and mid to apical segments of the anterior wall, anterolateral wall and anterior septum-consistent with infarct and most of the LAD territory; EF 35    Left ventricular aneurysm    Limited echo 7/19: Large apical septal aneurysm, severe hypokinesis of the entire apex and mid to apical segments of the anterior wall, anterolateral wall and anterior septum-consistent with infarct and most of the LAD territory; EF 35   Myocardial infarction 2003   Past Surgical History:  Procedure Laterality Date   CARDIAC CATHETERIZATION  06/11/2012   Nonobstructive CAD, patent stents, EF 50%, mild apical dyskinesis   CARDIAC CATHETERIZATION N/A 07/28/2014   Procedure: Left Heart Cath and Coronary Angiography;  Surgeon: Lennette Bihari, MD;  Location: [facility] INVASIVE CV LAB;  Service: Cardiovascular;  Laterality: N/A;    CORONARY ANGIOPLASTY WITH STENT PLACEMENT  2003-2012    total of 4 stents place   CORONARY ARTERY BYPASS GRAFT N/A 05/08/2022   Procedure: CORONARY ARTERY BYPASS GRAFTING (CABG) x4, USING LEFT INTERNAL MAMMARY ARTERY AND ENDOSCOPICALLY HARVESTED RIGHT GREATER SAPHENOUS VEIN;  Surgeon: Eugenio Hoes, MD;  Location: [facility] OR;  Service: Open Heart Surgery;  Laterality: N/A;   CORONARY STENT INTERVENTION N/A 10/20/2017   Procedure: CORONARY STENT INTERVENTION;  Surgeon: Tonny Bollman, MD;  Location: [facility] INVASIVE CV LAB;  Service: Cardiovascular;  Laterality: N/A;   COSMETIC SURGERY Left 1970's   "dog ripped of my ear"   LEFT HEART CATH AND CORONARY ANGIOGRAPHY N/A 10/20/2017   Procedure: LEFT HEART CATH AND CORONARY ANGIOGRAPHY;  Surgeon: Tonny Bollman, MD;  Location: [facility] INVASIVE CV LAB;  Service: Cardiovascular;  Laterality: N/A;   LEFT HEART CATH AND CORONARY ANGIOGRAPHY N/A 05/02/2022   Procedure: LEFT HEART CATH AND CORONARY ANGIOGRAPHY;  Surgeon: Marykay Lex, MD;  Location: [facility] INVASIVE CV LAB;  Service: Cardiovascular;  Laterality: N/A;   LEFT HEART CATHETERIZATION WITH CORONARY ANGIOGRAM N/A 06/11/2012   Procedure: LEFT HEART CATHETERIZATION WITH CORONARY ANGIOGRAM;  Surgeon: Peter M Swaziland, MD;  Location: [facility] CATH LAB;  Service: Cardiovascular;  Laterality: N/A;   TEE WITHOUT CARDIOVERSION N/A 05/08/2022   Procedure: TRANSESOPHAGEAL ECHOCARDIOGRAM;  Surgeon: Eugenio Hoes, MD;  Location: [facility] OR;  Service: Open Heart Surgery;  Laterality: N/A;    Allergies  Allergies  Allergen Reactions   Asa [Aspirin] Other (See Comments)    Abdominal bleeding   Penicillins Anaphylaxis    Has patient had a PCN reaction causing immediate rash, facial/tongue/throat swelling, SOB or lightheadedness with hypotension: Yes Has patient had a PCN reaction causing severe rash involving mucus membranes or skin necrosis: No Has patient had a PCN reaction that required hospitalization: No Has patient had a PCN  reaction occurring within the last 10 years: No If all of the above answers are "NO", then may proceed with Cephalosporin use.   Statins Other (See Comments)    Stiff joints, muscle tightness, couldn't walk    History of Present Illness    [name]  is a 77 year old male with the above mention past medical history who presents today for post bypass follow-up.  [name] has an extensive cardiac history with multiple PCI's due to recurrent MIs.  He presented to the ED by EMS after experiencing left-sided chest pressure that was 6 out of 10.  Pain resolved with nitro and arrival and EKG showed lateral infarct concerning for NSTEMI.  Patient was admitted and troponins continue to increase [id number].  Patient underwent LHC that revealed poor targets for PCI and recommendation for CABG x 4  that was completed on 05/08/2022. LIMA to the LAD and RSVG from the aorta to the PDA and from the aorta to the OM1 sequenced to OM2.   He did have some postop volume overload and was treated with a course of diuretics.  He had a follow-up 05/22/2022 and was doing well with occasional bouts of shortness of breath with exertion. He was instructed to continue with sternal precautions (no more than lifting 10 pounds) for at least 8 weeks.  He was seen by Dr. Milinda Antis 8/24 with incisional pain.  He was advised to trial Tylenol.  [name] presents today for post hospital follow-up with his sister.  Since last being seen in the office patient reports that he is slowly improving and having less sternal chest discomfort. He reports discomfort in his right lower leg at his vein harvest site.  In examining both areas there were no signs of infection.  His blood pressure today is well-controlled at 118/60 and heart rate is 74 bpm.  He is compliant with his medications and denies any adverse reactions.  During today's visit we reinforced and discussed his lifting precautions and need for pulmonary toileting and incentive spirometer  use.  He previously participated in cardiac rehab with his stent placement and is excited to participate in the future.  Patient denies chest pain, palpitations, dyspnea, PND, orthopnea, nausea, vomiting, dizziness, syncope, edema, weight gain, or early satiety.    Home Medications    Current Outpatient Medications  Medication Sig Dispense Refill   acetaminophen (TYLENOL) 500 MG tablet Take 1,000 mg by mouth as needed for moderate pain.     Alpha-D-Galactosidase (BEANO PO) 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).     Bacillus Coagulans-Inulin (ALIGN PREBIOTIC-PROBIOTIC PO) Take by mouth daily at 6 (six) AM.     carvedilol (COREG) 3.125 MG tablet Take 1 tablet (3.125 mg total) by mouth 2 (two) times daily with a meal. 60 tablet 1   clopidogrel (PLAVIX) 75 MG tablet Take 1 tablet (75 mg total) by mouth daily. 30 tablet 3   ezetimibe (ZETIA) 10 MG tablet Take 1 tablet (10 mg total) by mouth daily. 30 tablet 1   Fe Fum-Vit C-Vit B12-FA (TRIGELS-F FORTE) CAPS capsule Take 1 capsule  by mouth daily after breakfast. 30 capsule 2   LORazepam (ATIVAN) 0.5 MG tablet Take 0.5 mg by mouth as needed for anxiety.     omega-3 acid ethyl esters (LOVAZA) 1 G capsule Take 1 g by mouth 2 (two) times daily.     OVER THE COUNTER MEDICATION Take 1 tablet by mouth in the morning and at bedtime. Beet Root chew     pantoprazole (PROTONIX) 40 MG tablet Take 1 tablet (40 mg total) by mouth daily. 30 tablet 11   polyethylene glycol (MIRALAX) 17 g packet Take 17 g by mouth daily.     zolpidem (AMBIEN) 10 MG tablet Take 1 tablet (10 mg total) by mouth at bedtime as needed. For sleep (Patient taking differently: Take 5-10 mg by mouth at bedtime as needed for sleep.) 30 tablet 5   No current facility-administered medications for this visit.     Review of Systems  Please see the history of present illness.    (+) Incision pain (+) Mild shortness of breath with exertion  All other systems reviewed and are otherwise negative  except as noted above.  Physical Exam    Wt Readings from Last 3 Encounters:  06/12/22 176 lb (79.8 kg)  06/02/22 178 lb (80.7 kg)  05/12/22 184 lb 1.6 oz (83.5 kg)   VS: Vitals:   06/12/22 1104  BP: 118/60  Pulse: 74  SpO2: 94%  ,Body mass index is 27.57 kg/m.  Constitutional:      Appearance: Healthy appearance. Not in distress.  Neck:     Vascular: JVD normal.  Pulmonary:     Effort: Pulmonary effort is normal.     Breath sounds: No wheezing. No rales. Diminished in the bases Cardiovascular:     Normal rate. Regular rhythm. Normal S1. Normal S2.      Murmurs: There is no murmur.  Edema:    Peripheral edema absent.  Abdominal:     Palpations: Abdomen is soft non tender. There is no hepatomegaly.  Skin:    General: Skin is warm and dry.  Neurological:     General: No focal deficit present.     Mental Status: Alert and oriented to person, place and time.     Cranial Nerves: Cranial nerves are intact.  EKG/LABS/ Recent Cardiac Studies    ECG personally reviewed by me today -none completed today   Lab Results  Component Value Date   WBC 5.7 05/11/2022   HGB 8.8 (L) 05/11/2022   HCT 26.4 (L) 05/11/2022   MCV 94.6 05/11/2022   PLT 178 05/11/2022   Lab Results  Component Value Date   CREATININE 1.22 05/11/2022   BUN 28 (H) 05/11/2022   NA 133 (L) 05/11/2022   K 4.3 05/11/2022   CL 101 05/11/2022   CO2 26 05/11/2022   Lab Results  Component Value Date   ALT 43 05/02/2022   AST 39 05/02/2022   ALKPHOS 40 05/02/2022   BILITOT 0.8 05/02/2022   Lab Results  Component Value Date   CHOL 210 (H) 06/16/2019   HDL 38 (L) 06/16/2019   LDLCALC 112 (H) 06/16/2019   TRIG 350 (H) 06/16/2019   CHOLHDL 5.5 (H) 06/16/2019    Lab Results  Component Value Date   HGBA1C 7.0 (H) 05/06/2022    Cardiac Studies & Procedures   CARDIAC CATHETERIZATION  CARDIAC CATHETERIZATION 05/03/2022  Narrative   Prox LAD lesion is 75% stenosed - just prior to stent    Previously placed Mid LAD to  Dist LAD stent of unknown type is  widely patent.   Prox Cx to Mid Cx lesion is 65% stenosed -just proximal to prior stent   Previously placed Prox RCA to Mid RCA stent of unknown type is  widely patent.   Previously placed Mid Cx to Dist Cx stent is 5% stenosed with 90% stenosed side branch in 2nd Mrg.   Mid RCA lesion is 80% stenosed - just distal to stent.  Mid RCA to Dist RCA lesion is 65% stenosed.  Dist RCA lesion is 80% stenosed.   -----------------------------------------------   There is moderate left ventricular systolic dysfunction.  The left ventricular ejection fraction is 35-45% by visual estimate.   LV end diastolic pressure is severely elevated.   There is no aortic valve stenosis.  POST-OP DIAGNOSES Severe Multi-Vessel CAD: Prox LAD 75%@ SP1 (just prior to extensive stented segment) Prox-mid LCx ~65% (prox stent edge lesion) with progression of jailed OM2 lesion to 90% (likely Culprit) Patent Prox to mid RCA stents with sequential focal lesions 80%-60-70% lesions & 80%  Known to be moderate Ischemic CM - EG ~ 40% (unable to assess regional wall motion abnormalities due to poor filling. Recommend 2D echo to better assess)   PLAN Unsure what the best option here is.  Clearly all the lesion segments are treatable with PCI with exception of potentially the OM branch which is jailed.  Complete revascularization with CABG is also an option.  At this point, the best plan is to temporize medically and discussed with interventional colleagues as well as CVTS consultation (will need to be called in AM by rounding MD). Treat with a short course of Aggrastat (6 hours) for potential thrombotic disease in OM2. RCA and LAD given his ongoing pain. Restart heparin 6 to 8 hours after sheath removal which would be after Aggrastat completion. Hold clopidogrel Check 2D echo to better assess EF Continue IV nitroglycerin infusion and will hold Entresto for now defer to  rounding team based on blood pressure to determine whether will be restarted.  We will continue carvedilol.    Bryan Lemma  Findings Coronary Findings Diagnostic  Dominance: Right  Left Main Vessel is large.  Left Anterior Descending Prox LAD lesion is 75% stenosed. The lesion is focal and eccentric. Previously placed Mid LAD to Dist LAD stent of unknown type is  widely patent. The lesion is segmental.  First Septal Branch Vessel is small in size.  Left Circumflex Vessel is large. Prox Cx to Mid Cx lesion is 65% stenosed. Mid Cx to Dist Cx lesion is 5% stenosed with 90% stenosed side branch in 2nd Mrg. The lesion was previously treated using a stent (unknown type) over 2 years ago.  First Obtuse Marginal Branch Vessel is small in size.  Left Posterior Atrioventricular Artery Vessel is small in size.  Right Coronary Artery Vessel was injected. Vessel is large. Previously placed Prox RCA to Mid RCA stent of unknown type is  widely patent. Mid RCA lesion is 80% stenosed. The lesion is eccentric and irregular. Cannot exclude either calcific or thrombotic lesion Mid RCA to Dist RCA lesion is 65% stenosed. Dist RCA lesion is 80% stenosed.  Acute Marginal Branch Vessel is small in size.  Right Posterior Atrioventricular Artery Vessel is large in size.  First Right Posterolateral Branch Vessel is small in size.  Second Right Posterolateral Branch Vessel is moderate in size.  Intervention  No interventions have been documented.   CARDIAC CATHETERIZATION  CARDIAC CATHETERIZATION 10/20/2017  Narrative  Ost 1st Mrg to 1st Mrg lesion is 75% stenosed.  Non-stenotic Ost RCA to Mid RCA lesion was previously treated.  Mid RCA to Dist RCA lesion is 40% stenosed.  Prox Cx to Mid Cx lesion is 30% stenosed.  Prox LAD to Mid LAD lesion is 80% stenosed.  A drug-eluting stent was successfully placed using a STENT RESOLUTE ONYX 2.5X38.  Post intervention, there is a  0% residual stenosis.  1.  Multivessel coronary artery disease with continued patency of the stented segment in the RCA, continued patency of the stented segment in the left circumflex with mild in-stent restenosis and severe stenosis of a small obtuse marginal branch not suitable for PCI, and severe diffuse stenosis of the proximal and mid LAD treated successfully with overlapping resolute Onyx drug-eluting stents 2.  Known severe LV systolic dysfunction  Recommendation: The patient should continue on clopidogrel alone.  He is intolerant to aspirin.  I would favor long-term clopidogrel since he is intolerant to aspirin and has been treated with multiple stents.  Recommend dual antiplatelet therapy with Clopidogrel 75mg  daily long-term (beyond 12 months) because of multiple stents, severe LV dysfunction, ASA intolerance.  Findings Coronary Findings Diagnostic  Dominance: Right  Left Main Vessel is normal in caliber. Vessel is angiographically normal. The left main is patent with no stenosis.  Left Anterior Descending Prox LAD to Mid LAD lesion is 80% stenosed. The lesion was previously treated. The LAD has severe diffuse stenosis throughout the proximal and mid segment.  The intervening segment between the first and second diagonal branches have 70% stenosis, just at the second diagonal branch there is a tight 80 to 90% eccentric stenosis within the stented segment of the LAD.  Beyond the stented segment of LAD there is severe diffuse stenosis extending into the mid vessel of 80%.  The apical LAD is widely patent.  Left Circumflex Prox Cx to Mid Cx lesion is 30% stenosed. The lesion is located at the bend. The lesion was previously treated.  First Obtuse Marginal Branch Vessel is small in size. The proximal circumflex is calcified with mild nonobstructive stenosis of 20 to 30%.  The mid vessel is patent throughout the stented segment with 20 to 30% in-stent restenosis.  The first obtuse  marginal is small in caliber with a 75% eccentric stenosis not significantly changed from the previous study. Ost 1st Mrg to 1st Mrg lesion is 75% stenosed. The lesion is segmental. Small vessel  Right Coronary Artery Non-stenotic Ost RCA to Mid RCA lesion was previously treated. Mid RCA to Dist RCA lesion is 40% stenosed. Diffuse nonobstructive stenosis is present unchanged from the previous study.  The stented segment in the proximal to mid vessel is widely patent with no restenosis.  Intervention  Prox LAD to Mid LAD lesion Stent CATHETER LAUNCHER 6FREBU 3.5 guide catheter was inserted. Lesion crossed with guidewire using a WIRE COUGAR XT STRL 190CM. Pre-stent angioplasty was performed using a BALLOON EMERGE MR 2.5X15. A drug-eluting stent was successfully placed using a STENT RESOLUTE ONYX 2.5X38. Post-stent angioplasty was performed using a BALLOON [place] EMERGE MR 2.75X20. Heparin is used for anti-coagulation.  A therapeutic ACT greater than 300 seconds is achieved.  The patient is been maintained on long-term clopidogrel.  He is intolerant to aspirin with previous history of severe GI bleeding.  An EBU guide catheter is used and a Administrator, arts is advanced without difficulty into the apical LAD.  The long lesion is predilated with a 2.5 x 15 mm balloon  to high pressure.  The segment of disease beyond the stented segment is stented with a 2.5 x 38 mm resolute Onyx DES deployed at 14 atm.  That segment is postdilated with a 2.75 mm noncompliant balloon to 18 atm.  There is severe proximal stenosis off the proximal edge of the stent in that area is predilated to high pressure with the 2.75 mm noncompliant balloon and then stented with a 2.75 x 18 mm resolute Onyx DES deployed at 16 atm and postdilated with a 3.0 mm noncompliant balloon to 18 atm. Post-Intervention Lesion Assessment The intervention was successful. Pre-interventional TIMI flow is 3. Post-intervention TIMI flow is 3. No complications  occurred at this lesion. There is a 0% residual stenosis post intervention.     ECHOCARDIOGRAM  ECHOCARDIOGRAM COMPLETE 05/03/2022  Narrative ECHOCARDIOGRAM REPORT    Patient Name:   TIMOTY BOURKE Date of Exam: 05/03/2022 Medical Rec #:  [id number]      Height:       67.0 in Accession #:    [id number]     Weight:       180.6 lb Date of Birth:  January 03, 1946      BSA:          1.936 m Patient Age:    76 years       BP:           147/66 mmHg Patient Gender: M              HR:           72 bpm. Exam Location:  Inpatient  Procedure: 2D Echo, Cardiac Doppler, Color Doppler and Intracardiac Opacification Agent  Indications:    CAD Native Vessel I25.10  History:        Patient has prior history of Echocardiogram examinations, most recent 01/20/2022. Cardiomyopathy, NSTEMI, Previous Myocardial Infarction and CAD; Risk Factors:Hypertension and Former Smoker.  Sonographer:    Aron Baba Referring Phys: [id number] DAVID W HARDING   Sonographer Comments: Suboptimal parasternal window. Image acquisition challenging due to patient body habitus and Image acquisition challenging due to respiratory motion. IMPRESSIONS   1. 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 There is no visualized apical thrombus. . Left ventricular ejection fraction, by estimation, is 50%. The left ventricle has normal function. Left ventricular endocardial border not optimally defined to evaluate regional wall motion. Left ventricular diastolic parameters are consistent with Grade I diastolic dysfunction (impaired relaxation). 2. Right ventricular systolic function is normal. The right ventricular size is normal. Tricuspid regurgitation signal is inadequate for assessing PA pressure. 3. The mitral valve is normal in structure. Trivial mitral valve regurgitation. Mild to moderate mitral stenosis. 4. The aortic valve has an indeterminant number of cusps. Aortic valve regurgitation is moderate. No aortic stenosis is present. 5.  Aortic dilatation noted. 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 6. The inferior vena cava is normal in size with <50% respiratory variability, suggesting right atrial pressure of 8 mmHg.  FINDINGS Left Ventricle: The apex is akinetic and mildly aneurysmal. There is no visualized apical thrombus. Left ventricular ejection fraction, by estimation, is 50%. The left ventricle has normal function. Left ventricular endocardial border not optimally defined to evaluate regional wall motion. Definity contrast agent was given IV to delineate the left ventricular endocardial borders. The left ventricular internal cavity size was normal in size. There is no left ventricular hypertrophy. Left ventricular diastolic parameters are consistent with Grade I diastolic dysfunction (impaired relaxation). Normal left ventricular  filling pressure.  Right Ventricle: The right ventricular size is normal. Right vetricular wall thickness was not well visualized. Right ventricular systolic function is normal. Tricuspid regurgitation signal is inadequate for assessing PA pressure.  Left Atrium: Left atrial size was normal in size.  Right Atrium: Right atrial size was normal in size.  Pericardium: There is no evidence of pericardial effusion.  Mitral Valve: The mitral valve is normal in structure. There is mild thickening of the mitral valve leaflet(s). There is mild calcification of the mitral valve leaflet(s). Mild mitral annular calcification. Trivial mitral valve regurgitation. Mild to moderate mitral valve stenosis. MV peak gradient, 13.8 mmHg. The mean mitral valve gradient is 6.0 mmHg.  Tricuspid Valve: The tricuspid valve is normal in structure. Tricuspid valve regurgitation is not demonstrated. No evidence of tricuspid stenosis.  Aortic Valve: The aortic valve has an indeterminant number of cusps. Aortic valve regurgitation is moderate. Aortic regurgitation PHT measures 759 msec. No aortic  stenosis is present. Aortic valve mean gradient measures 4.9 mmHg. Aortic valve peak gradient measures 11.3 mmHg. Aortic valve area, by VTI measures 2.19 cm.  Pulmonic Valve: The pulmonic valve was not well visualized. Pulmonic valve regurgitation is not visualized. No evidence of pulmonic stenosis.  Aorta: The aortic root is normal in size and structure and aortic dilatation noted. 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  Venous: The inferior vena cava is normal in size with less than 50% respiratory variability, suggesting right atrial pressure of 8 mmHg.  IAS/Shunts: No atrial level shunt detected by color flow Doppler.   LEFT VENTRICLE PLAX 2D LVIDd:         3.90 cm   Diastology LVIDs:         2.70 cm   LV e' medial:    4.90 cm/s LV PW:         1.00 cm   LV E/e' medial:  19.7 LV IVS:        0.90 cm   LV e' lateral:   7.29 cm/s LVOT diam:     1.90 cm   LV E/e' lateral: 13.3 LV SV:         65 LV SV Index:   34 LVOT Area:     2.84 cm   RIGHT VENTRICLE RV S prime:     9.60 cm/s TAPSE (M-mode): 2.2 cm  LEFT ATRIUM             Index        RIGHT ATRIUM           Index LA diam:        3.20 cm 1.65 cm/m   RA Area:     18.55 cm LA Vol (A2C):   72.3 ml 37.34 ml/m  RA Volume:   52.20 ml  26.96 ml/m LA Vol (A4C):   53.0 ml 27.37 ml/m LA Biplane Vol: 62.9 ml 32.49 ml/m AORTIC VALVE                     PULMONIC VALVE AV Area (Vmax):    1.83 cm      PR End Diast Vel: 3.11 msec AV Area (Vmean):   2.07 cm AV Area (VTI):     2.19 cm AV Vmax:           168.44 cm/s AV Vmean:          100.239 cm/s AV VTI:  0.296 m AV Peak Grad:      11.3 mmHg AV Mean Grad:      4.9 mmHg LVOT Vmax:         109.00 cm/s LVOT Vmean:        73.200 cm/s LVOT VTI:          0.229 m LVOT/AV VTI ratio: 0.77 AI PHT:            759 msec  AORTA Ao Root diam: 3.80 cm Ao Asc diam:  3.70 cm  MITRAL VALVE MV Area (PHT): 3.39 cm     SHUNTS MV Area VTI:   1.11 cm      Systemic VTI:  0.23 m MV Peak grad:  13.8 mmHg    Systemic Diam: 1.90 cm MV Mean grad:  6.0 mmHg MV Vmax:       1.86 m/s MV Vmean:      109.0 cm/s MV Decel Time: 224 msec MV E velocity: 96.60 cm/s MV A velocity: 150.00 cm/s MV E/A ratio:  0.64  Dina Rich MD Electronically signed by Dina Rich MD Signature Date/Time: 05/03/2022/2:30:26 PM    Final   TEE  ECHO INTRAOPERATIVE TEE 05/08/2022  Narrative *INTRAOPERATIVE TRANSESOPHAGEAL REPORT *    Patient Name:   ASCENCION COYE Date of Exam: 05/08/2022 Medical Rec #:  [id number]      Height:       67.0 in Accession #:    [id number]     Weight:       185.8 lb Date of Birth:  [birth year]      BSA:          1.96 m Patient Age:    76 years       BP:           122/54 mmHg Patient Gender: M              HR:           47 bpm. Exam Location:  Anesthesiology  Transesophogeal exam was perform intraoperatively during surgical procedure. Patient was closely monitored under general anesthesia during the entirety of examination.  Indications:     CABG Performing Phys: [id number] Eugenio Hoes  Complications: No known complications during this procedure. POST-OP IMPRESSIONS _ Left Ventricle: The left ventricle is unchanged from pre-bypass. _ Right Ventricle: The right ventricle appears unchanged from pre-bypass. _ Aorta: The aorta appears unchanged from pre-bypass. _ Left Atrium: The left atrium appears unchanged from pre-bypass. _ Left Atrial Appendage: The left atrial appendage appears unchanged from pre-bypass. _ Aortic Valve: The aortic valve appears unchanged from pre-bypass. _ Mitral Valve: The mitral valve appears unchanged from pre-bypass. _ Tricuspid Valve: The tricuspid valve appears unchanged from pre-bypass. _ Pulmonic Valve: The pulmonic valve appears unchanged from pre-bypass. _ Interatrial Septum: The interatrial septum appears unchanged from pre-bypass. _ Interventricular Septum: The interventricular septum appears  unchanged from pre-bypass. _ Pericardium: The pericardium appears unchanged from pre-bypass. _ Comments: Good LV function post bypass No RWMA, valves as before.  PRE-OP FINDINGS Left Ventricle: The left ventricle has normal systolic function, with an ejection fraction of 55-60%. The cavity size was normal. There is no increase in left ventricular wall thickness. No evidence of left ventricular regional wall motion abnormalities. There is borderline concentric left ventricular hypertrophy of the inferior and anterior segments. Left ventricular diastolic parameters are consistent with Grade I diastolic dysfunction (impaired relaxation).   Right Ventricle: The right ventricle has normal systolic function. The cavity was normal. There is  no increase in right ventricular wall thickness.  Left Atrium: Left atrial size was normal in size. No left atrial/left atrial appendage thrombus was detected. The left atrial appendage is well visualized and there is no evidence of thrombus present.  Right Atrium: Right atrial size was normal in size.  Interatrial Septum: No atrial level shunt detected by color flow Doppler. Agitated saline contrast was given intravenously to evaluate for intracardiac shunting. There is no evidence of a patent foramen ovale.  Pericardium: There is no evidence of pericardial effusion.  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 Mitral valve regurgitation is mild by color flow Doppler. The MR jet is centrally-directed. 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 There is moderate thickening and moderate calcification present on the mitral valve P3 cusp with mildly decreased mobility and there is moderate thickening and moderate calcification present on the mitral valve A1 cusp with normal mobility.  Tricuspid Valve: The tricuspid valve was normal in structure. Tricuspid valve regurgitation was not visualized by color flow Doppler. No evidence of tricuspid stenosis is present.  Aortic  Valve: The aortic valve is normal in structure. Aortic valve regurgitation is mild by color flow Doppler. The jet is centrally-directed. There is no stenosis of the aortic valve. There is mild thickening and mild calcification present on the aortic valve right coronary cusp with normal mobility and there is mild thickening and mild calcification present on the aortic valve left coronary cusp with normal mobility and there is moderate thickening and moderate calcification present on the aortic valve non-coronary cusp with normal mobility.  Pulmonic Valve: The pulmonic valve was normal in structure, with normal. Pulmonic valve regurgitation is mild by color flow Doppler.   Aorta: The aortic root and ascending aorta are normal in size and structure. There is borderline dilatation of the aortic root, measuring 37 mm.  Shunts: There is no evidence of an atrial septal defect.  +--------------+--------++ LEFT VENTRICLE         +--------------+--------++ PLAX 2D                +--------------+--------++ LVIDd:        5.30 cm  +--------------+--------++ LVIDs:        3.50 cm  +--------------+--------++ LVOT diam:    2.30 cm  +--------------+--------++ LV SV:        84 ml    +--------------+--------++ LV SV Index:  41.85    +--------------+--------++ LVOT Area:    4.15 cm +--------------+--------++                        +--------------+--------++  +------------+--------++ AORTIC VALVE         +------------+--------++ AR PHT:     799 msec +------------+--------++  +-------------+-------++ AORTA                +-------------+-------++ Ao Root diam:3.70 cm +-------------+-------++   +--------------+-------+ SHUNTS                +--------------+-------+ Systemic Diam:2.30 cm +--------------+-------+   Eilene Ghazi MD Electronically signed by Eilene Ghazi MD Signature Date/Time: 05/08/2022/4:47:15 PM    Final      CARDIAC MRI  MR CARDIAC MORPHOLOGY W WO CONTRAST 12/25/2017  Narrative CLINICAL DATA:  Cardiomyopathy  EXAM: CARDIAC MRI  TECHNIQUE: The patient was scanned on a 1.5 Tesla GE magnet. A dedicated cardiac coil was used. Functional imaging was done using Fiesta sequences. 2,3, and 4 chamber views were done to assess for RWMA's. Modified Simpson's rule using  a short axis stack was used to calculate an ejection fraction on a dedicated work [employer]. The patient received 9 cc of Multihance. After 10 minutes inversion recovery sequences were used to assess for infiltration and scar tissue.  CONTRAST:  9 cc Gadavist  FINDINGS: There was mild LAE. Normal RA/RV. The aortic root was normal at 3.3 cm. The AV was tri leaflet with moderate appearing central AR. The MV was normal. There was a trivial pericardial effusion Quantitative EF was 46% (EDV 142 ESV 77 SV 66) The distal septum, anterior wall apex and inferior apex were akinetic. There was no mural apical thrombus. Delayed enhancement images showed full thickness scar and thinning of the distal septum, anterior wall apex and inferior apex  IMPRESSION: 1. Mild LVE with thinned and akinetic distal septum, anterior wall apex and inferior apex EF 46%  2. Full thickness scar involving distal septum, anterior wall, apex and inferior apex  3. Tri- leaflet AV with normal aortic root 3.3 cm moderate appearing central AR  4.  Normal RV size and function  5.  Trivial pericardial effusion  6.  Mild LAE  Charlton Haws   Electronically Signed By: Charlton Haws M.D. On: 12/25/2017 14:12         Assessment & Plan    1.CAD/CABG x 4: -Patient had complaint of chest pain 1 week prior to admission found to have three-vessel CAD with no targets for PCI.  He underwent CABG x 4 that was successful. -He was seen last by Dr. Leafy Ro on 4/8 he was cleared for cardiac rehab and was advised to try Tylenol for pain moving  forward.  He was given clearance to drive after coming off of narcotics. -Today patient reports improvement to chest pain that is slowly feeling better. -Continue GDMT with carvedilol 3.125 mg twice daily, Plavix 75 mg daily, Zetia 10 mg daily -Patient is cleared to begin cardiac rehab.  2.  Chronic combined CHF: -Patient's last 2D echo was 50% on 04/2022 -Today patient reports minimal shortness of breath with activity and is euvolemic on exam. -Continue GDMT with carvedilol 3.125 mg twice daily and is currently not on Entresto due to hypotension and will readdress at follow-up.  3.  HLD: -Patient's last LDL cholesterol was 115 -Patient is statin intolerant and declined PCSK9 inhibitor  4.  DM type II: -Patient's last hemoglobin A1c was 7.0 -Continue lifestyle modifications  5.  CKD stage IIIa: -Last creatinine was 1.22    Cardiac Rehabilitation Eligibility Assessment  The patient is ready to start cardiac rehabilitation from a cardiac standpoint.     Disposition: Follow-up with Charlton Haws, MD or APP in 3 months    Medication Adjustments/Labs and Tests Ordered: Current medicines are reviewed at length with the patient today.  Concerns regarding medicines are outlined above.   Signed, Napoleon Form, Leodis Rains, NP 06/12/2022, 12:02 PM [facility]  Note:  This document was prepared using Dragon voice recognition software and may include unintentional dictation errors.

## 2022-06-20 ENCOUNTER — Telehealth (HOSPITAL_COMMUNITY): Payer: Self-pay

## 2022-06-20 ENCOUNTER — Other Ambulatory Visit: Payer: Self-pay | Admitting: Surgical

## 2022-06-20 ENCOUNTER — Other Ambulatory Visit: Payer: Self-pay | Admitting: Cardiovascular Disease

## 2022-06-20 NOTE — Telephone Encounter (Signed)
Pt insurance is active and benefits verified through [facility] Medicare/Dual Complete Co-pay $0.00, DED $240.00/$240.00 met, out of pocket $8,850.00/$2,142.57 met, co-insurance 20%. No pre-authorization required. [employer]port, 06/20/22 @ 10:54AM, REF#[id number]   How many CR sessions are covered? (36 sessions/visits for TCR, 72 sessions/visits for ICR)72 visits Is this a lifetime maximum or an annual maximum? Annual Has the member used any of these services to date? No Is there a time limit (weeks/months) on start of program and/or program completion? No     Will contact patient to see if he is interested in the Cardiac Rehab Program.

## 2022-06-20 NOTE — Telephone Encounter (Signed)
Called patient to see if he was interested in participating in the Cardiac Rehab Program. Patient stated yes. Patient will come in for orientation on 06/23/22 @ 10AM and will attend the 12:30PM exercise class. Will check pt insurance closer to scheduling.     Pensions consultant.

## 2022-06-20 NOTE — Telephone Encounter (Signed)
Pt called back needing to changed his cardiac rehab orientation date to May 2@1030 .

## 2022-06-23 ENCOUNTER — Ambulatory Visit (HOSPITAL_COMMUNITY): Payer: 59

## 2022-06-25 ENCOUNTER — Telehealth (HOSPITAL_COMMUNITY): Payer: Self-pay

## 2022-06-25 NOTE — Telephone Encounter (Signed)
Pt called to confirm Cardiac Rehab Orientation tomorrow at 1030. CR Nursing Assessment completed with pt. Directions and instructions given for the appointment.

## 2022-06-26 ENCOUNTER — Encounter (HOSPITAL_COMMUNITY)
Admission: RE | Admit: 2022-06-26 | Discharge: 2022-06-26 | Disposition: A | Discharge status: Home / Self Care | Payer: 59 | Source: Ambulatory Visit | Attending: Cardiovascular Disease | Admitting: Cardiovascular Disease

## 2022-06-26 VITALS — BP 112/70 | HR 76 | Ht 66.0 in | Wt 179.2 lb

## 2022-06-26 DIAGNOSIS — I214 Non-ST elevation (NSTEMI) myocardial infarction: Secondary | ICD-10-CM

## 2022-06-26 DIAGNOSIS — Z951 Presence of aortocoronary bypass graft: Secondary | ICD-10-CM

## 2022-06-26 NOTE — Progress Notes (Signed)
Cardiac Rehab Medication Review   Does the patient  feel that his/her medications are working for him/her?  yes  Has the patient been experiencing any side effects to the medications prescribed?  no  Does the patient measure his/her own blood pressure or blood glucose at home?  yes   Does the patient have any problems obtaining medications due to transportation or finances?   no  Understanding of regimen: good Understanding of indications: good Potential of compliance: good    Comments: Pt is checking his blood pressure 1-2 x/week. Pt is weighing daily.     Lorin Picket 06/26/2022 1:49 PM

## 2022-06-26 NOTE — Progress Notes (Signed)
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  05/02/22 NSTEMI (non-ST elevated myocardial infarction) [facility])  Patient's Home Medications on Admission:  Current Outpatient Medications:    acetaminophen (TYLENOL) 500 MG tablet, Take 1,000 mg by mouth as needed for moderate pain., Disp: , Rfl:    Alpha-D-Galactosidase (BEANO PO), 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)., Disp: , Rfl:    Bacillus Coagulans-Inulin (ALIGN PREBIOTIC-PROBIOTIC PO), Take by mouth daily at 6 (six) AM., Disp: , Rfl:    carvedilol (COREG) 3.125 MG tablet, Take 1 tablet (3.125 mg total) by mouth 2 (two) times daily with a meal., Disp: 60 tablet, Rfl: 1   clopidogrel (PLAVIX) 75 MG tablet, Take 1 tablet (75 mg total) by mouth daily., Disp: 30 tablet, Rfl: 3   ezetimibe (ZETIA) 10 MG tablet, TAKE 1 TABLET BY MOUTH EVERY DAY, Disp: 90 tablet, Rfl: 3   Fe Fum-Vit C-Vit B12-FA (TRIGELS-F FORTE) CAPS capsule, Take 1 capsule by mouth daily after breakfast., Disp: 30 capsule, Rfl: 2   LORazepam (ATIVAN) 0.5 MG tablet, Take 0.5 mg by mouth as needed for anxiety., Disp: , Rfl:    omega-3 acid ethyl esters (LOVAZA) 1 G capsule, Take 1 g by mouth 2 (two) times daily., Disp: , Rfl:    OVER THE COUNTER MEDICATION, Take 1 tablet by mouth in the morning and at bedtime. Beet Root chew, Disp: , Rfl:    pantoprazole (PROTONIX) 40 MG tablet, Take 1 tablet (40 mg total) by mouth daily., Disp: 30 tablet, Rfl: 11   polyethylene glycol (MIRALAX) 17 g packet, Take  17 g by mouth daily., Disp: , Rfl:    zolpidem (AMBIEN) 10 MG tablet, Take 1 tablet (10 mg total) by mouth at bedtime as needed. For sleep (Patient taking differently: Take 5-10 mg by mouth at bedtime as needed for sleep.), Disp: 30 tablet, Rfl: 5  Past Medical History: Past Medical History:  Diagnosis Date   Anxiety    Arthritis    "wrists" (07/27/2014)   Coronary artery disease    a.  Pt with 4 stents over the course of 2003-2012;  b.  LHC 6/16:  LAD, RCA, LCx stents patent, OM1 50%, EF 50-55% >> med rx    Elevated LFTs    GERD (gastroesophageal reflux disease)    Hypercholesterolemia    Hypertension    Ischemic cardiomyopathy    cMRI 03/2011:  EF 48% with dist ant, septal, apical and inf-apical dyskinesia, no apical clot, dist ant, apical and septal full thickness scar >> No ICD needed  //  Limited echo 7/19: Large apical septal aneurysm, severe hypokinesis of the entire apex and mid to apical segments of the anterior wall, anterolateral wall and anterior septum-consistent with infarct and most of the LAD territory; EF 35    Left ventricular aneurysm    Limited echo 7/19: Large apical septal aneurysm, severe hypokinesis of the entire apex and mid to apical segments of the anterior wall, anterolateral wall and anterior septum-consistent with infarct and most of  the LAD territory; EF 35   Myocardial infarction (HCC) 2003    Tobacco Use: Social History   Tobacco Use  Smoking Status Former   Packs/day: 1.00   Years: 15.00   Additional pack years: 0.00   Total pack years: 15.00   Types: Cigarettes  Smokeless Tobacco Never  Tobacco Comments   "quit smoking cigarettes in 1979"    Labs: Review Flowsheet  More data exists      Latest Ref Rng & Units 01/08/2011 06/11/2012 06/16/2019 05/06/2022 05/08/2022  Labs for ITP Cardiac and Pulmonary Rehab  Cholestrol 100 - 199 mg/dL 161  096  045  - -  LDL (calc) 0 - 99 mg/dL 77  73  409  - -  HDL-C >39 mg/dL 30  34  38  - -  Trlycerides 0 -  149 mg/dL 811  914  782  - -  Hemoglobin A1c 4.8 - 5.6 % 6.0  - - 7.0  -  PH, Arterial 7.35 - 7.45 - - - - 7.360  7.336  7.368  7.368  7.387  7.341   PCO2 arterial 32 - 48 mmHg - - - - 37.2  37.4  35.2  37.7  37.8  40.9   Bicarbonate 20.0 - 28.0 mmol/L - - - - 21.0  19.9  20.6  21.7  22.7  21.7  22.1   TCO2 22 - 32 mmol/L - - - - 22  21  22  24  23  26  24  27  23  23  23  23    Acid-base deficit 0.0 - 2.0 mmol/L - - - - 4.0  5.0  5.0  3.0  2.0  4.0  3.0   O2 Saturation % - - - - 99  97  99  100  100  88  100     Capillary Blood Glucose: Lab Results  Component Value Date   GLUCAP 120 (H) 05/12/2022   GLUCAP 102 (H) 05/12/2022   GLUCAP 138 (H) 05/11/2022   GLUCAP 118 (H) 05/11/2022   GLUCAP 112 (H) 05/11/2022     Exercise Target Goals: Exercise Program Goal: Individual exercise prescription set using results from initial 6 min walk test and THRR while considering  patient's activity barriers and safety.   Exercise Prescription Goal: Initial exercise prescription builds to 30-45 minutes a day of aerobic activity, 2-3 days per week.  Home exercise guidelines will be given to patient during program as part of exercise prescription that the participant will acknowledge.  Activity Barriers & Risk Stratification:  Activity Barriers & Cardiac Risk Stratification - 06/26/22 1316       Activity Barriers & Cardiac Risk Stratification   Activity Barriers Deconditioning;Muscular Weakness;Shortness of Breath;Incisional Pain;Other (comment)    Comments Sternal Precautions    Cardiac Risk Stratification High             6 Minute Walk:  6 Minute Walk     Row Name 06/26/22 1213         6 Minute Walk   Phase Initial     Distance 1038 feet     Walk Time 6 minutes     # of Rest Breaks 0     MPH 2     METS 1.93     RPE 11     Perceived Dyspnea  1     VO2 Peak 6.8     Symptoms Yes (comment)     Comments Rt lower leg surgical pain  3/10, SOB RPD = 1     Resting HR 72 bpm      Resting BP 112/70     Resting Oxygen Saturation  98 %     Exercise Oxygen Saturation  during 6 min walk 98 %     Max Ex. HR 98 bpm     Max Ex. BP 122/78     2 Minute Post BP 118/70              Oxygen Initial Assessment:   Oxygen Re-Evaluation:   Oxygen Discharge (Final Oxygen Re-Evaluation):   Initial Exercise Prescription:  Initial Exercise Prescription - 06/26/22 1300       Date of Initial Exercise RX and Referring Provider   Date 06/26/22    Referring Provider Charlton Haws, MD    Expected Discharge Date 09/05/22      NuStep   Level 1    SPM 75    Minutes 15    METs 1.9      Track   Laps 8    Minutes 15    METs 1.9      Prescription Details   Frequency (times per week) 3x    Duration Progress to 30 minutes of continuous aerobic without signs/symptoms of physical distress      Intensity   THRR 40-80% of Max Heartrate 58-115    Ratings of Perceived Exertion 11-13    Perceived Dyspnea 0-4      Progression   Progression Continue progressive overload as per policy without signs/symptoms or physical distress.      Resistance Training   Training Prescription Yes    Weight 2 lbs    Reps 10-15             Perform Capillary Blood Glucose checks as needed.  Exercise Prescription Changes:   Exercise Comments:   Exercise Goals and Review:   Exercise Goals     Row Name 06/26/22 1253             Exercise Goals   Increase Physical Activity Yes       Intervention Provide advice, education, support and counseling about physical activity/exercise needs.;Develop an individualized exercise prescription for aerobic and resistive training based on initial evaluation findings, risk stratification, comorbidities and participant's personal goals.       Expected Outcomes Short Term: Attend rehab on a regular basis to increase amount of physical activity.;Long Term: Add in home exercise to make exercise part of routine and to increase amount of physical  activity.;Long Term: Exercising regularly at least 3-5 days a week.       Increase Strength and Stamina Yes       Intervention Develop an individualized exercise prescription for aerobic and resistive training based on initial evaluation findings, risk stratification, comorbidities and participant's personal goals.;Provide advice, education, support and counseling about physical activity/exercise needs.       Expected Outcomes Short Term: Increase workloads from initial exercise prescription for resistance, speed, and METs.;Short Term: Perform resistance training exercises routinely during rehab and add in resistance training at home;Long Term: Improve cardiorespiratory fitness, muscular endurance and strength as measured by increased METs and functional capacity ( )       Able to understand and use rate of perceived exertion (RPE) scale Yes       Intervention Provide education and explanation on how to use RPE scale       Expected Outcomes Short Term: Able to use RPE daily in rehab to express subjective intensity  level;Long Term:  Able to use RPE to guide intensity level when exercising independently       Knowledge and understanding of Target Heart Rate Range (THRR) Yes       Intervention Provide education and explanation of THRR including how the numbers were predicted and where they are located for reference       Expected Outcomes Short Term: Able to state/look up THRR;Long Term: Able to use THRR to govern intensity when exercising independently;Short Term: Able to use daily as guideline for intensity in rehab       Understanding of Exercise Prescription Yes       Intervention Provide education, explanation, and written materials on patient's individual exercise prescription       Expected Outcomes Short Term: Able to explain program exercise prescription;Long Term: Able to explain home exercise prescription to exercise independently                Exercise Goals Re-Evaluation  :   Discharge Exercise Prescription (Final Exercise Prescription Changes):   Nutrition:  Target Goals: Understanding of nutrition guidelines, daily intake of sodium 1500mg , cholesterol 200mg , calories 30% from fat and 7% or less from saturated fats, daily to have 5 or more servings of fruits and vegetables.  Biometrics:  Pre Biometrics - 06/26/22 1237       Pre Biometrics   Waist Circumference 42 inches    Hip Circumference 39 inches    Waist to Hip Ratio 1.08 %    Triceps Skinfold 15 mm    Grip Strength 38 kg    Flexibility 0 in   unable to reach   Single Leg Stand 15 seconds              Nutrition Therapy Plan and Nutrition Goals:   Nutrition Assessments:  MEDIFICTS Score Key: ?70 Need to make dietary changes  40-70 Heart Healthy Diet ? 40 Therapeutic Level Cholesterol Diet    Picture Your Plate Scores: <16 Unhealthy dietary pattern with much room for improvement. 41-50 Dietary pattern unlikely to meet recommendations for good health and room for improvement. 51-60 More healthful dietary pattern, with some room for improvement.  >60 Healthy dietary pattern, although there may be some specific behaviors that could be improved.    Nutrition Goals Re-Evaluation:   Nutrition Goals Re-Evaluation:   Nutrition Goals Discharge (Final Nutrition Goals Re-Evaluation):   Psychosocial: Target Goals: Acknowledge presence or absence of significant depression and/or stress, maximize coping skills, provide positive support system. Participant is able to verbalize types and ability to use techniques and skills needed for reducing stress and depression.  Initial Review & Psychosocial Screening:  Initial Psych Review & Screening - 06/26/22 1312       Initial Review   Current issues with Current Sleep Concerns      Family Dynamics   Good Support System? Yes   Pt has his significant other Linda     Barriers   Psychosocial barriers to participate in program There  are no identifiable barriers or psychosocial needs.      Screening Interventions   Interventions Encouraged to exercise             Quality of Life Scores:  Quality of Life - 06/26/22 1329       Quality of Life   Select Quality of Life      Quality of Life Scores   Health/Function Pre 24.3 %    Socioeconomic Pre 29.14 %    Psych/Spiritual Pre 27.43 %  Family Pre 28.8 %    GLOBAL Pre 26.6 %            Scores of 19 and below usually indicate a poorer quality of life in these areas.  A difference of  2-3 points is a clinically meaningful difference.  A difference of 2-3 points in the total score of the Quality of Life Index has been associated with significant improvement in overall quality of life, self-image, physical symptoms, and general health in studies assessing change in quality of life.  PHQ-9: Review Flowsheet       06/26/2022 04/02/2018  Depression screen PHQ 2/9  Decreased Interest 0 0  Down, Depressed, Hopeless 0 0  PHQ - 2 Score 0 0  Altered sleeping 2 -  Tired, decreased energy 2 -  Change in appetite 0 -  Feeling bad or failure about yourself  0 -  Trouble concentrating 0 -  Moving slowly or fidgety/restless 1 -  Suicidal thoughts 0 -  PHQ-9 Score 5 -  Difficult doing work/chores Somewhat difficult -   Interpretation of Total Score  Total Score Depression Severity:  1-4 = Minimal depression, 5-9 = Mild depression, 10-14 = Moderate depression, 15-19 = Moderately severe depression, 20-27 = Severe depression   Psychosocial Evaluation and Intervention:   Psychosocial Re-Evaluation:   Psychosocial Discharge (Final Psychosocial Re-Evaluation):   Vocational Rehabilitation: Provide vocational rehab assistance to qualifying candidates.   Vocational Rehab Evaluation & Intervention:  Vocational Rehab - 06/26/22 1314       Initial Vocational Rehab Evaluation & Intervention   Assessment shows need for Vocational Rehabilitation No   Pt denies any  vocational needs            Education: Education Goals: Education classes will be provided on a weekly basis, covering required topics. Participant will state understanding/return demonstration of topics presented.     Core Videos: Exercise    Move It!  Clinical staff conducted group or individual video education with verbal and written material and guidebook.  Patient learns the recommended Pritikin exercise program. Exercise with the goal of living a long, healthy life. Some of the health benefits of exercise include controlled diabetes, healthier blood pressure levels, improved cholesterol levels, improved heart and lung capacity, improved sleep, and better body composition. Everyone should speak with their doctor before starting or changing an exercise routine.  Biomechanical Limitations Clinical staff conducted group or individual video education with verbal and written material and guidebook.  Patient learns how biomechanical limitations can impact exercise and how we can mitigate and possibly overcome limitations to have an impactful and balanced exercise routine.  Body Composition Clinical staff conducted group or individual video education with verbal and written material and guidebook.  Patient learns that body composition (ratio of muscle mass to fat mass) is a key component to assessing overall fitness, rather than body weight alone. Increased fat mass, especially visceral belly fat, can put [place] at increased risk for metabolic syndrome, type 2 diabetes, heart disease, and even death. It is recommended to combine diet and exercise (cardiovascular and resistance training) to improve your body composition. Seek guidance from your physician and exercise physiologist before implementing an exercise routine.  Exercise Action Plan Clinical staff conducted group or individual video education with verbal and written material and guidebook.  Patient learns the recommended strategies to  achieve and enjoy long-term exercise adherence, including variety, self-motivation, self-efficacy, and positive decision making. Benefits of exercise include fitness, good health, weight management, more energy, better  sleep, less stress, and overall well-being.  Medical   Heart Disease Risk Reduction Clinical staff conducted group or individual video education with verbal and written material and guidebook.  Patient learns our heart is our most vital organ as it circulates oxygen, nutrients, white blood cells, and hormones throughout the entire body, and carries waste away. Data supports a plant-based eating plan like the Pritikin Program for its effectiveness in slowing progression of and reversing heart disease. The video provides a number of recommendations to address heart disease.   Metabolic Syndrome and Belly Fat  Clinical staff conducted group or individual video education with verbal and written material and guidebook.  Patient learns what metabolic syndrome is, how it leads to heart disease, and how one can reverse it and keep it from coming back. You have metabolic syndrome if you have 3 of the following 5 criteria: abdominal obesity, high blood pressure, high triglycerides, low HDL cholesterol, and high blood sugar.  Hypertension and Heart Disease Clinical staff conducted group or individual video education with verbal and written material and guidebook.  Patient learns that high blood pressure, or hypertension, is very common in the [place]. Hypertension is largely due to excessive salt intake, but other important risk factors include being overweight, physical inactivity, drinking too much alcohol, smoking, and not eating enough potassium from fruits and vegetables. High blood pressure is a leading risk factor for heart attack, stroke, congestive heart failure, dementia, kidney failure, and premature death. Long-term effects of excessive salt intake include stiffening of the  arteries and thickening of heart muscle and organ damage. Recommendations include ways to reduce hypertension and the risk of heart disease.  Diseases of Our Time - Focusing on Diabetes Clinical staff conducted group or individual video education with verbal and written material and guidebook.  Patient learns why the best way to stop diseases of our time is prevention, through food and other lifestyle changes. Medicine (such as prescription pills and surgeries) is often only a Band-Aid on the problem, not a long-term solution. Most common diseases of our time include obesity, type 2 diabetes, hypertension, heart disease, and cancer. The Pritikin Program is recommended and has been proven to help reduce, reverse, and/or prevent the damaging effects of metabolic syndrome.  Nutrition   Overview of the Pritikin Eating Plan  Clinical staff conducted group or individual video education with verbal and written material and guidebook.  Patient learns about the Pritikin Eating Plan for disease risk reduction. The Pritikin Eating Plan emphasizes a wide variety of unrefined, minimally-processed carbohydrates, like fruits, vegetables, whole grains, and legumes. Go, Caution, and Stop food choices are explained. Plant-based and lean animal proteins are emphasized. Rationale provided for low sodium intake for blood pressure control, low added sugars for blood sugar stabilization, and low added fats and oils for coronary artery disease risk reduction and weight management.  Calorie Density  Clinical staff conducted group or individual video education with verbal and written material and guidebook.  Patient learns about calorie density and how it impacts the Pritikin Eating Plan. Knowing the characteristics of the food you choose will help you decide whether those foods will lead to weight gain or weight loss, and whether you want to consume more or less of them. Weight loss is usually a side effect of the Pritikin  Eating Plan because of its focus on low calorie-dense foods.  Label Reading  Clinical staff conducted group or individual video education with verbal and written material and guidebook.  Patient learns  about the Pritikin recommended label reading guidelines and corresponding recommendations regarding calorie density, added sugars, sodium content, and whole grains.  Dining Out - Part 1  Clinical staff conducted group or individual video education with verbal and written material and guidebook.  Patient learns that restaurant meals can be sabotaging because they can be so high in calories, fat, sodium, and/or sugar. Patient learns recommended strategies on how to positively address this and avoid unhealthy pitfalls.  Facts on Fats  Clinical staff conducted group or individual video education with verbal and written material and guidebook.  Patient learns that lifestyle modifications can be just as effective, if not more so, as many medications for lowering your risk of heart disease. A Pritikin lifestyle can help to reduce your risk of inflammation and atherosclerosis (cholesterol build-up, or plaque, in the artery walls). Lifestyle interventions such as dietary choices and physical activity address the cause of atherosclerosis. A review of the types of fats and their impact on blood cholesterol levels, along with dietary recommendations to reduce fat intake is also included.  Nutrition Action Plan  Clinical staff conducted group or individual video education with verbal and written material and guidebook.  Patient learns how to incorporate Pritikin recommendations into their lifestyle. Recommendations include planning and keeping personal health goals in mind as an important part of their success.  Healthy Mind-Set    Healthy Minds, Bodies, Hearts  Clinical staff conducted group or individual video education with verbal and written material and guidebook.  Patient learns how to identify when they  are stressed. Video will discuss the impact of that stress, as well as the many benefits of stress management. Patient will also be introduced to stress management techniques. The way we think, act, and feel has an impact on our hearts.  How Our Thoughts Can Heal Our Hearts  Clinical staff conducted group or individual video education with verbal and written material and guidebook.  Patient learns that negative thoughts can cause depression and anxiety. This can result in negative lifestyle behavior and serious health problems. Cognitive behavioral therapy is an effective method to help control our thoughts in order to change and improve our emotional outlook.  Additional Videos:  Exercise    Improving Performance  Clinical staff conducted group or individual video education with verbal and written material and guidebook.  Patient learns to use a non-linear approach by alternating intensity levels and lengths of time spent exercising to help burn more calories and lose more body fat. Cardiovascular exercise helps improve heart health, metabolism, hormonal balance, blood sugar control, and recovery from fatigue. Resistance training improves strength, endurance, balance, coordination, reaction time, metabolism, and muscle mass. Flexibility exercise improves circulation, posture, and balance. Seek guidance from your physician and exercise physiologist before implementing an exercise routine and learn your capabilities and proper form for all exercise.  Introduction to Yoga  Clinical staff conducted group or individual video education with verbal and written material and guidebook.  Patient learns about yoga, a discipline of the coming together of mind, breath, and body. The benefits of yoga include improved flexibility, improved range of motion, better posture and core strength, increased lung function, weight loss, and positive self-image. Yoga's heart health benefits include lowered blood pressure,  healthier heart rate, decreased cholesterol and triglyceride levels, improved immune function, and reduced stress. Seek guidance from your physician and exercise physiologist before implementing an exercise routine and learn your capabilities and proper form for all exercise.  Medical   Aging: Enhancing Your Quality  of Life  Clinical staff conducted group or individual video education with verbal and written material and guidebook.  Patient learns key strategies and recommendations to stay in good physical health and enhance quality of life, such as prevention strategies, having an advocate, securing a Health Care Proxy and Power of Attorney, and keeping a list of medications and system for tracking them. It also discusses how to avoid risk for bone loss.  Biology of Weight Control  Clinical staff conducted group or individual video education with verbal and written material and guidebook.  Patient learns that weight gain occurs because we consume more calories than we burn (eating more, moving less). Even if your body weight is normal, you may have higher ratios of fat compared to muscle mass. Too much body fat puts you at increased risk for cardiovascular disease, heart attack, stroke, type 2 diabetes, and obesity-related cancers. In addition to exercise, following the Pritikin Eating Plan can help reduce your risk.  Decoding Lab Results  Clinical staff conducted group or individual video education with verbal and written material and guidebook.  Patient learns that lab test reflects one measurement whose values change over time and are influenced by many factors, including medication, stress, sleep, exercise, food, hydration, pre-existing medical conditions, and more. It is recommended to use the knowledge from this video to become more involved with your lab results and evaluate your numbers to speak with your doctor.   Diseases of Our Time - Overview  Clinical staff conducted group or  individual video education with verbal and written material and guidebook.  Patient learns that according to the CDC, 50% to 70% of chronic diseases (such as obesity, type 2 diabetes, elevated lipids, hypertension, and heart disease) are avoidable through lifestyle improvements including healthier food choices, listening to satiety cues, and increased physical activity.  Sleep Disorders Clinical staff conducted group or individual video education with verbal and written material and guidebook.  Patient learns how good quality and duration of sleep are important to overall health and well-being. Patient also learns about sleep disorders and how they impact health along with recommendations to address them, including discussing with a physician.  Nutrition  Dining Out - Part 2 Clinical staff conducted group or individual video education with verbal and written material and guidebook.  Patient learns how to plan ahead and communicate in order to maximize their dining experience in a healthy and nutritious manner. Included are recommended food choices based on the type of restaurant the patient is visiting.   Fueling a Banker conducted group or individual video education with verbal and written material and guidebook.  There is a strong connection between our food choices and our health. Diseases like obesity and type 2 diabetes are very prevalent and are in large-part due to lifestyle choices. The Pritikin Eating Plan provides plenty of food and hunger-curbing satisfaction. It is easy to follow, affordable, and helps reduce health risks.  Menu Workshop  Clinical staff conducted group or individual video education with verbal and written material and guidebook.  Patient learns that restaurant meals can sabotage health goals because they are often packed with calories, fat, sodium, and sugar. Recommendations include strategies to plan ahead and to communicate with the manager,  chef, or server to help order a healthier meal.  Planning Your Eating Strategy  Clinical staff conducted group or individual video education with verbal and written material and guidebook.  Patient learns about the Pritikin Eating Plan and its benefit of  reducing the risk of disease. The Pritikin Eating Plan does not focus on calories. Instead, it emphasizes high-quality, nutrient-rich foods. By knowing the characteristics of the foods, we choose, we can determine their calorie density and make informed decisions.  Targeting Your Nutrition Priorities  Clinical staff conducted group or individual video education with verbal and written material and guidebook.  Patient learns that lifestyle habits have a tremendous impact on disease risk and progression. This video provides eating and physical activity recommendations based on your personal health goals, such as reducing LDL cholesterol, losing weight, preventing or controlling type 2 diabetes, and reducing high blood pressure.  Vitamins and Minerals  Clinical staff conducted group or individual video education with verbal and written material and guidebook.  Patient learns different ways to obtain key vitamins and minerals, including through a recommended healthy diet. It is important to discuss all supplements you take with your doctor.   Healthy Mind-Set    Smoking Cessation  Clinical staff conducted group or individual video education with verbal and written material and guidebook.  Patient learns that cigarette smoking and tobacco addiction pose a serious health risk which affects millions of people. Stopping smoking will significantly reduce the risk of heart disease, lung disease, and many forms of cancer. Recommended strategies for quitting are covered, including working with your doctor to develop a successful plan.  Culinary   Becoming a Set designer conducted group or individual video education with verbal and written  material and guidebook.  Patient learns that cooking at home can be healthy, cost-effective, quick, and puts them in control. Keys to cooking healthy recipes will include looking at your recipe, assessing your equipment needs, planning ahead, making it simple, choosing cost-effective seasonal ingredients, and limiting the use of added fats, salts, and sugars.  Cooking - Breakfast and Snacks  Clinical staff conducted group or individual video education with verbal and written material and guidebook.  Patient learns how important breakfast is to satiety and nutrition through the entire day. Recommendations include key foods to eat during breakfast to help stabilize blood sugar levels and to prevent overeating at meals later in the day. Planning ahead is also a key component.  Cooking - Educational psychologist conducted group or individual video education with verbal and written material and guidebook.  Patient learns eating strategies to improve overall health, including an approach to cook more at home. Recommendations include thinking of animal protein as a side on your plate rather than center stage and focusing instead on lower calorie dense options like vegetables, fruits, whole grains, and plant-based proteins, such as beans. Making sauces in large quantities to freeze for later and leaving the skin on your vegetables are also recommended to maximize your experience.  Cooking - Healthy Salads and Dressing Clinical staff conducted group or individual video education with verbal and written material and guidebook.  Patient learns that vegetables, fruits, whole grains, and legumes are the foundations of the Pritikin Eating Plan. Recommendations include how to incorporate each of these in flavorful and healthy salads, and how to create homemade salad dressings. Proper handling of ingredients is also covered. Cooking - Soups and [employer] - Soups and Desserts Clinical staff conducted  group or individual video education with verbal and written material and guidebook.  Patient learns that Pritikin soups and desserts make for easy, nutritious, and delicious snacks and meal components that are low in sodium, fat, sugar, and calorie density, while high in vitamins,  minerals, and filling fiber. Recommendations include simple and healthy ideas for soups and desserts.   Overview     The Pritikin Solution Program Overview Clinical staff conducted group or individual video education with verbal and written material and guidebook.  Patient learns that the results of the Pritikin Program have been documented in more than 100 articles published in peer-reviewed journals, and the benefits include reducing risk factors for (and, in some cases, even reversing) high cholesterol, high blood pressure, type 2 diabetes, obesity, and more! An overview of the three key pillars of the Pritikin Program will be covered: eating well, doing regular exercise, and having a healthy mind-set.  WORKSHOPS  Exercise: Exercise Basics: Building Your Action Plan Clinical staff led group instruction and group discussion with PowerPoint presentation and patient guidebook. To enhance the learning environment the use of posters, models and videos may be added. At the conclusion of this workshop, patients will comprehend the difference between physical activity and exercise, as well as the benefits of incorporating both, into their routine. Patients will understand the FITT (Frequency, Intensity, Time, and Type) principle and how to use it to build an exercise action plan. In addition, safety concerns and other considerations for exercise and cardiac rehab will be addressed by the presenter. The purpose of this lesson is to promote a comprehensive and effective weekly exercise routine in order to improve patients' overall level of fitness.   Managing Heart Disease: Your Path to a Healthier Heart Clinical staff led  group instruction and group discussion with PowerPoint presentation and patient guidebook. To enhance the learning environment the use of posters, models and videos may be added.At the conclusion of this workshop, patients will understand the anatomy and physiology of the heart. Additionally, they will understand how Pritikin's three pillars impact the risk factors, the progression, and the management of heart disease.  The purpose of this lesson is to provide a high-level overview of the heart, heart disease, and how the Pritikin lifestyle positively impacts risk factors.  Exercise Biomechanics Clinical staff led group instruction and group discussion with PowerPoint presentation and patient guidebook. To enhance the learning environment the use of posters, models and videos may be added. Patients will learn how the structural parts of their bodies function and how these functions impact their daily activities, movement, and exercise. Patients will learn how to promote a neutral spine, learn how to manage pain, and identify ways to improve their physical movement in order to promote healthy living. The purpose of this lesson is to expose patients to common physical limitations that impact physical activity. Participants will learn practical ways to adapt and manage aches and pains, and to minimize their effect on regular exercise. Patients will learn how to maintain good posture while sitting, walking, and lifting.  Balance Training and Fall Prevention  Clinical staff led group instruction and group discussion with PowerPoint presentation and patient guidebook. To enhance the learning environment the use of posters, models and videos may be added. At the conclusion of this workshop, patients will understand the importance of their sensorimotor skills (vision, proprioception, and the vestibular system) in maintaining their ability to balance as they age. Patients will apply a variety of balancing  exercises that are appropriate for their current level of function. Patients will understand the common causes for poor balance, possible solutions to these problems, and ways to modify their physical environment in order to minimize their fall risk. The purpose of this lesson is to teach patients about the importance  of maintaining balance as they age and ways to minimize their risk of falling.  WORKSHOPS   Nutrition:  Fueling a Ship broker led group instruction and group discussion with PowerPoint presentation and patient guidebook. To enhance the learning environment the use of posters, models and videos may be added. Patients will review the foundational principles of the Pritikin Eating Plan and understand what constitutes a serving size in each of the food groups. Patients will also learn Pritikin-friendly foods that are better choices when away from home and review make-ahead meal and snack options. Calorie density will be reviewed and applied to three nutrition priorities: weight maintenance, weight loss, and weight gain. The purpose of this lesson is to reinforce (in a group setting) the key concepts around what patients are recommended to eat and how to apply these guidelines when away from home by planning and selecting Pritikin-friendly options. Patients will understand how calorie density may be adjusted for different weight management goals.  Mindful Eating  Clinical staff led group instruction and group discussion with PowerPoint presentation and patient guidebook. To enhance the learning environment the use of posters, models and videos may be added. Patients will briefly review the concepts of the Pritikin Eating Plan and the importance of low-calorie dense foods. The concept of mindful eating will be introduced as well as the importance of paying attention to internal hunger signals. Triggers for non-hunger eating and techniques for dealing with triggers will be explored.  The purpose of this lesson is to provide patients with the opportunity to review the basic principles of the Pritikin Eating Plan, discuss the value of eating mindfully and how to measure internal cues of hunger and fullness using the Hunger Scale. Patients will also discuss reasons for non-hunger eating and learn strategies to use for controlling emotional eating.  Targeting Your Nutrition Priorities Clinical staff led group instruction and group discussion with PowerPoint presentation and patient guidebook. To enhance the learning environment the use of posters, models and videos may be added. Patients will learn how to determine their genetic susceptibility to disease by reviewing their family history. Patients will gain insight into the importance of diet as part of an overall healthy lifestyle in mitigating the impact of genetics and other environmental insults. The purpose of this lesson is to provide patients with the opportunity to assess their personal nutrition priorities by looking at their family history, their own health history and current risk factors. Patients will also be able to discuss ways of prioritizing and modifying the Pritikin Eating Plan for their highest risk areas  Menu  Clinical staff led group instruction and group discussion with PowerPoint presentation and patient guidebook. To enhance the learning environment the use of posters, models and videos may be added. Using menus brought in from [employer], or printed from [employer], patients will apply the Pritikin dining out guidelines that were presented in the [employer] video. Patients will also be able to practice these guidelines in a variety of provided scenarios. The purpose of this lesson is to provide patients with the opportunity to practice hands-on learning of the Pritikin Dining Out guidelines with actual menus and practice scenarios.  Label Reading Clinical staff led group instruction  and group discussion with PowerPoint presentation and patient guidebook. To enhance the learning environment the use of posters, models and videos may be added. Patients will review and discuss the Pritikin label reading guidelines presented in Pritikin's Label Reading Educational series video. Using fool labels  brought in from local grocery stores and markets, patients will apply the label reading guidelines and determine if the packaged food meet the Pritikin guidelines. The purpose of this lesson is to provide patients with the opportunity to review, discuss, and practice hands-on learning of the Pritikin Label Reading guidelines with actual packaged food labels. Cooking School  Pritikin's [employer] are designed to teach patients ways to prepare quick, simple, and affordable recipes at home. The importance of nutrition's role in chronic disease risk reduction is reflected in its emphasis in the overall Pritikin program. By learning how to prepare essential core Pritikin Eating Plan recipes, patients will increase control over what they eat; be able to customize the flavor of foods without the use of added salt, sugar, or fat; and improve the quality of the food they consume. By learning a set of core recipes which are easily assembled, quickly prepared, and affordable, patients are more likely to prepare more healthy foods at home. These workshops focus on convenient breakfasts, simple entres, side dishes, and desserts which can be prepared with minimal effort and are consistent with nutrition recommendations for cardiovascular risk reduction. Cooking [employer] are taught by a Armed forces logistics/support/administrative officer (RD) who has been trained by the [employer]. The chef or RD has a clear understanding of the importance of minimizing - if not completely eliminating - added fat, sugar, and sodium in recipes. Throughout the series of Cooking School Workshop sessions, patients will learn  about healthy ingredients and efficient methods of cooking to build confidence in their capability to prepare    Cooking School weekly topics:  Adding Flavor- Sodium-Free  Fast and Healthy Breakfasts  Powerhouse Plant-Based Proteins  Satisfying Salads and Dressings  Simple Sides and Sauces  International Cuisine-Spotlight on the [employer] Zones  Delicious Desserts  Savory Soups  [employer] - Meals in a Astronomer Appetizers and Snacks  Comforting Weekend Breakfasts  One-Pot Wonders   Fast Evening Meals  Landscape architect Your Pritikin Plate  WORKSHOPS   Healthy Mindset (Psychosocial):  Focused Goals, Sustainable Changes Clinical staff led group instruction and group discussion with PowerPoint presentation and patient guidebook. To enhance the learning environment the use of posters, models and videos may be added. Patients will be able to apply effective goal setting strategies to establish at least one personal goal, and then take consistent, meaningful action toward that goal. They will learn to identify common barriers to achieving personal goals and develop strategies to overcome them. Patients will also gain an understanding of how our mind-set can impact our ability to achieve goals and the importance of cultivating a positive and growth-oriented mind-set. The purpose of this lesson is to provide patients with a deeper understanding of how to set and achieve personal goals, as well as the tools and strategies needed to overcome common obstacles which may arise along the way.  From Head to Heart: The Power of a Healthy Outlook  Clinical staff led group instruction and group discussion with PowerPoint presentation and patient guidebook. To enhance the learning environment the use of posters, models and videos may be added. Patients will be able to recognize and describe the impact of emotions and mood on physical health. They will discover the importance of  self-care and explore self-care practices which may work for them. Patients will also learn how to utilize the 4 C's to cultivate a healthier outlook and better manage stress and challenges. The purpose of this lesson  is to demonstrate to patients how a healthy outlook is an essential part of maintaining good health, especially as they continue their cardiac rehab journey.  Healthy Sleep for a Healthy Heart Clinical staff led group instruction and group discussion with PowerPoint presentation and patient guidebook. To enhance the learning environment the use of posters, models and videos may be added. At the conclusion of this workshop, patients will be able to demonstrate knowledge of the importance of sleep to overall health, well-being, and quality of life. They will understand the symptoms of, and treatments for, common sleep disorders. Patients will also be able to identify daytime and nighttime behaviors which impact sleep, and they will be able to apply these tools to help manage sleep-related challenges. The purpose of this lesson is to provide patients with a general overview of sleep and outline the importance of quality sleep. Patients will learn about a few of the most common sleep disorders. Patients will also be introduced to the concept of "sleep hygiene," and discover ways to self-manage certain sleeping problems through simple daily behavior changes. Finally, the workshop will motivate patients by clarifying the links between quality sleep and their goals of heart-healthy living.   Recognizing and Reducing Stress Clinical staff led group instruction and group discussion with PowerPoint presentation and patient guidebook. To enhance the learning environment the use of posters, models and videos may be added. At the conclusion of this workshop, patients will be able to understand the types of stress reactions, differentiate between acute and chronic stress, and recognize the impact that chronic  stress has on their health. They will also be able to apply different coping mechanisms, such as reframing negative self-talk. Patients will have the opportunity to practice a variety of stress management techniques, such as deep abdominal breathing, progressive muscle relaxation, and/or guided imagery.  The purpose of this lesson is to educate patients on the role of stress in their lives and to provide healthy techniques for coping with it.  Learning Barriers/Preferences:  Learning Barriers/Preferences - 06/26/22 1314       Learning Barriers/Preferences   Learning Barriers Sight    Learning Preferences Skilled Demonstration;Verbal Instruction;Written Material;Audio;Computer/Internet;Group Instruction;Individual Instruction;Pictoral;Video             Education Topics:  Knowledge Questionnaire Score:  Knowledge Questionnaire Score - 06/26/22 1330       Knowledge Questionnaire Score   Pre Score 19/24             Core Components/Risk Factors/Patient Goals at Admission:  Personal Goals and Risk Factors at Admission - 06/26/22 1315       Core Components/Risk Factors/Patient Goals on Admission   Hypertension Yes    Intervention Monitor prescription use compliance.;Provide education on lifestyle modifcations including regular physical activity/exercise, weight management, moderate sodium restriction and increased consumption of fresh fruit, vegetables, and low fat dairy, alcohol moderation, and smoking cessation.    Expected Outcomes Short Term: Continued assessment and intervention until BP is < 140/15mm HG in hypertensive participants. < 130/38mm HG in hypertensive participants with diabetes, heart failure or chronic kidney disease.;Long Term: Maintenance of blood pressure at goal levels.    Lipids Yes    Intervention Provide education and support for participant on nutrition & aerobic/resistive exercise along with prescribed medications to achieve LDL 70mg , HDL >40mg .     Expected Outcomes Short Term: Participant states understanding of desired cholesterol values and is compliant with medications prescribed. Participant is following exercise prescription and nutrition guidelines.;Long Term: Cholesterol controlled with medications  as prescribed, with individualized exercise RX and with personalized nutrition plan. Value goals: LDL < 70mg , HDL > 40 mg.             Core Components/Risk Factors/Patient Goals Review:    Core Components/Risk Factors/Patient Goals at Discharge (Final Review):    ITP Comments:  ITP Comments     Row Name 06/26/22 1100           ITP Comments Dr. Armanda Magic medical director. Introduction to pritikin education program/intensive cardiac rehab. Initial orientation packet reviewed with patient.                Comments: Participant attended orientation for the cardiac rehabilitation program on  06/26/2022  to perform initial intake and exercise walk test. Patient introduced to the Pritikin Program education and orientation packet was reviewed. Completed 6-minute walk test, measurements, initial ITP, and exercise prescription. Vital signs stable. Telemetry-normal sinus rhythm, rare PVC, asymptomatic.   Service time was from 10:45 to 13:08.

## 2022-06-30 ENCOUNTER — Encounter: Payer: Self-pay | Admitting: Student

## 2022-06-30 ENCOUNTER — Ambulatory Visit: Payer: 59 | Attending: Internal Medicine | Admitting: Student

## 2022-06-30 DIAGNOSIS — E7849 Other hyperlipidemia: Secondary | ICD-10-CM

## 2022-06-30 DIAGNOSIS — E785 Hyperlipidemia, unspecified: Secondary | ICD-10-CM

## 2022-06-30 NOTE — Progress Notes (Signed)
Patient ID: [name]                 DOB: 09-Jul-1945                    MRN: [id number]      HPI: [name] is a 77 y.o. male patient referred to lipid clinic by Dr.Harding. PMH is significant for CAD with several PCI's from anterior MI with in-stent restenosis 09/2017, ICM, HLD, arthritis, anxiety, CABG x 4, GI bleed s/p clipping for duodenal erosion, T2DM, CKD Stage III.   Patient presented for lipid clinic. After recent event he has changed his diet significantly. He eats less meat and more greens, fish. He walks around his living room. He is afraid of needles but willing to try PCSK9i. He had tried different statins in the past but was not able to tolerate them well due to severe muscle pain. He does not remember name or doses of statins. He has been taking Zetia and Lovaza daily and tolerating them well without any problem.  Reviewed options for lowering LDLc and TG including fibrates, PCSK-9 inhibitors, bempedoic acid and inclisiran.  Discussed mechanisms of action, dosing, side effects and potential decreases in LDL cholesterol.  Also reviewed cost information and potential options for patient assistance.  Current Medications: Zetia 10 mg daily Lovaza 1 gm twice daily  Intolerances: statins (lovastatin, simvastatin) Stiff joints, muscle tightness, couldn't walk Risk Factors: CAD with several PCI's from anterior MI with in-stent restenosis 09/2017, ICM, HLD,T2DM, CKD Stage III LDL goal: <55 mg /dl   Diet: eats less meat, and more salads and fish, used air fryer  Quit drinking soda  Exercise: walking 15-20 min per day   Family History:  Mother: MI age 66  Father: Dementia  Social History:  Smoking: none  Alcohol: wine occasional   Labs: Lipid Panel     Component Value Date/Time   CHOL 210 (H) 06/16/2019 1102   TRIG 350 (H) 06/16/2019 1102   HDL 38 (L) 06/16/2019 1102   CHOLHDL 5.5 (H) 06/16/2019 1102   CHOLHDL 5.3 06/11/2012 0234   VLDL 73 (H) 06/11/2012 0234    LDLCALC 112 (H) 06/16/2019 1102   LABVLDL 60 (H) 06/16/2019 1102    Past Medical History:  Diagnosis Date   Anxiety    Arthritis    "wrists" (07/27/2014)   Coronary artery disease    a.  Pt with 4 stents over the course of 2003-2012;  b.  LHC 6/16:  LAD, RCA, LCx stents patent, OM1 50%, EF 50-55% >> med rx    Elevated LFTs    GERD (gastroesophageal reflux disease)    Hypercholesterolemia    Hypertension    Ischemic cardiomyopathy    cMRI 03/2011:  EF 48% with dist ant, septal, apical and inf-apical dyskinesia, no apical clot, dist ant, apical and septal full thickness scar >> No ICD needed  //  Limited echo 7/19: Large apical septal aneurysm, severe hypokinesis of the entire apex and mid to apical segments of the anterior wall, anterolateral wall and anterior septum-consistent with infarct and most of the LAD territory; EF 35    Left ventricular aneurysm    Limited echo 7/19: Large apical septal aneurysm, severe hypokinesis of the entire apex and mid to apical segments of the anterior wall, anterolateral wall and anterior septum-consistent with infarct and most of the LAD territory; EF 35   Myocardial infarction [facility]) 2003    Current Outpatient Medications on File Prior to Visit  Medication Sig Dispense Refill   acetaminophen (TYLENOL) 500 MG tablet Take 1,000 mg by mouth as needed for moderate pain.     Alpha-D-Galactosidase (BEANO PO) 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).     Bacillus Coagulans-Inulin (ALIGN PREBIOTIC-PROBIOTIC PO) Take by mouth daily at 6 (six) AM.     carvedilol (COREG) 3.125 MG tablet Take 1 tablet (3.125 mg total) by mouth 2 (two) times daily with a meal. 60 tablet 1   clopidogrel (PLAVIX) 75 MG tablet Take 1 tablet (75 mg total) by mouth daily. 30 tablet 3   ezetimibe (ZETIA) 10 MG tablet TAKE 1 TABLET BY MOUTH EVERY DAY 90 tablet 3   Fe Fum-Vit C-Vit B12-FA (TRIGELS-F FORTE) CAPS capsule Take 1 capsule by mouth daily after breakfast. 30 capsule 2   LORazepam  (ATIVAN) 0.5 MG tablet Take 0.5 mg by mouth as needed for anxiety.     omega-3 acid ethyl esters (LOVAZA) 1 G capsule Take 1 g by mouth 2 (two) times daily.     OVER THE COUNTER MEDICATION Take 1 tablet by mouth in the morning and at bedtime. Beet Root chew     pantoprazole (PROTONIX) 40 MG tablet Take 1 tablet (40 mg total) by mouth daily. 30 tablet 11   polyethylene glycol (MIRALAX) 17 g packet Take 17 g by mouth daily.     zolpidem (AMBIEN) 10 MG tablet Take 1 tablet (10 mg total) by mouth at bedtime as needed. For sleep (Patient taking differently: Take 5-10 mg by mouth at bedtime as needed for sleep.) 30 tablet 5   No current facility-administered medications on file prior to visit.    Allergies  Allergen Reactions   Asa [Aspirin] Other (See Comments)    Abdominal bleeding   Penicillins Anaphylaxis    Has patient had a PCN reaction causing immediate rash, facial/tongue/throat swelling, SOB or lightheadedness with hypotension: Yes Has patient had a PCN reaction causing severe rash involving mucus membranes or skin necrosis: No Has patient had a PCN reaction that required hospitalization: No Has patient had a PCN reaction occurring within the last 10 years: No If all of the above answers are "NO", then may proceed with Cephalosporin use.   Statins Other (See Comments)    Stiff joints, muscle tightness, couldn't walk     Problem  Hyperlipidemia   Current Medications: Zetia 10 mg daily Lovaza 1 gm twice daily  Intolerances: statins (lovastatin, simvastatin) Stiff joints, muscle tightness, couldn't walk Risk Factors: CAD with several PCI's from anterior MI with in-stent restenosis 09/2017, ICM, HLD,T2DM, CKD Stage III LDL goal: <55 mg /dl     Hyperlipidemia Assessment:  LDL goal: < 55 mg/dl last LDLc 161 mg/dl (09/60/4540) TG: 981 mg/dl  Tolerates Zetia and Lovaza well without any side effects  Intolerance to statins- stiff joints, muscle tightness, couldn't walk Discussed next  potential options (PCSK-9 inhibitors, bempedoic acid and inclisiran); cost, dosing efficacy, side effects  For elevated TG will add fibrates - get baseline LFT today    Plan: Continue taking current medications Zetia 10 mg daily and Lovaza 1 gm twice daily  Will apply for PA for PCSK9i; will inform patient upon approval (prefers MyChart message) Baseline LFT due today - will add Tricor 145 mg daily if LFT WNL  Lipid lab due in 2-3 months after starting PCSK9i    Thank you,  Carmela Hurt, Pharm.D [facility] [address] [place], [place] [address]  Phone: [phone]; Fax: [phone]

## 2022-06-30 NOTE — Patient Instructions (Signed)
Your Results:             Your most recent labs Goal  Total Cholesterol 210 < 200  Triglycerides 350 < 150  HDL (happy/good cholesterol) 38 > 40  LDL (lousy/bad cholesterol 112 < 55   Medication changes: We will start the process to get PCSK9i (Repatha or Praluent) covered by your insurance.  Once the prior authorization is complete, we will call you to let you know and confirm pharmacy information.     Praluent is a cholesterol medication that improved your body's ability to get rid of "bad cholesterol" known as LDL. It can lower your LDL up to 60%. It is an injection that is given under the skin every 2 weeks. The most common side effects of Praluent include runny nose, symptoms of the common cold, rarely flu or flu-like symptoms, back/muscle pain in about 3-4% of the patients, and redness, pain, or bruising at the injection site.    Repatha is a cholesterol medication that improved your body's ability to get rid of "bad cholesterol" known as LDL. It can lower your LDL up to 60%! It is an injection that is given under the skin every 2 weeks. The most common side effects of Repatha include runny nose, symptoms of the common cold, rarely flu or flu-like symptoms, back/muscle pain in about 3-4% of the patients, and redness, pain, or bruising at the injection site.   Lab orders: We want to repeat labs after 2-3 months.  We will send you a lab order to remind you once we get closer to that time.

## 2022-06-30 NOTE — Assessment & Plan Note (Addendum)
Assessment:  LDL goal: < 55 mg/dl last LDLc 782 mg/dl (95/62/1308) TG: 657 mg/dl  Tolerates Zetia and Lovaza well without any side effects  Intolerance to statins- stiff joints, muscle tightness, couldn't walk Discussed next potential options (PCSK-9 inhibitors, bempedoic acid and inclisiran); cost, dosing efficacy, side effects  For elevated TG will add fibrates - get baseline LFT today    Plan: Continue taking current medications Zetia 10 mg daily and Lovaza 1 gm twice daily  Will apply for PA for PCSK9i; will inform patient upon approval (prefers MyChart message) Baseline LFT due today - will add Tricor 145 mg daily if LFT WNL  Lipid lab due in 2-3 months after starting [facility]

## 2022-07-01 LAB — HEPATIC FUNCTION PANEL
ALT: 33 IU/L (ref 0–44)
AST: 22 IU/L (ref 0–40)
Albumin: 4.3 g/dL (ref 3.8–4.8)
Alkaline Phosphatase: 81 IU/L (ref 44–121)
Bilirubin Total: <0.2 mg/dL (ref 0.0–1.2)
Bilirubin, Direct: <0.1 mg/dL (ref 0.00–0.40)
Total Protein: 7.3 g/dL (ref 6.0–8.5)

## 2022-07-02 ENCOUNTER — Encounter (HOSPITAL_COMMUNITY)
Admission: RE | Admit: 2022-07-02 | Discharge: 2022-07-02 | Disposition: A | Discharge status: Home / Self Care | Payer: 59 | Source: Ambulatory Visit | Attending: Cardiovascular Disease | Admitting: Cardiovascular Disease

## 2022-07-02 DIAGNOSIS — Z951 Presence of aortocoronary bypass graft: Secondary | ICD-10-CM

## 2022-07-02 DIAGNOSIS — I214 Non-ST elevation (NSTEMI) myocardial infarction: Secondary | ICD-10-CM

## 2022-07-02 NOTE — Progress Notes (Signed)
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  05/02/22 NSTEMI (non-ST elevated myocardial infarction) [facility])  Patient's Home Medications on Admission:  Current Outpatient Medications:    acetaminophen (TYLENOL) 500 MG tablet, Take 1,000 mg by mouth as needed for moderate pain., Disp: , Rfl:    Alpha-D-Galactosidase (BEANO PO), 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)., Disp: , Rfl:    Bacillus Coagulans-Inulin (ALIGN PREBIOTIC-PROBIOTIC PO), Take by mouth daily at 6 (six) AM., Disp: , Rfl:    carvedilol (COREG) 3.125 MG tablet, Take 1 tablet (3.125 mg total) by mouth 2 (two) times daily with a meal., Disp: 60 tablet, Rfl: 1   clopidogrel (PLAVIX) 75 MG tablet, Take 1 tablet (75 mg total) by mouth daily., Disp: 30 tablet, Rfl: 3   ezetimibe (ZETIA) 10 MG tablet, TAKE 1 TABLET BY MOUTH EVERY DAY, Disp: 90 tablet, Rfl: 3   Fe Fum-Vit C-Vit B12-FA (TRIGELS-F FORTE) CAPS capsule, Take 1 capsule by mouth daily after breakfast., Disp: 30 capsule, Rfl: 2   LORazepam (ATIVAN) 0.5 MG tablet, Take 0.5 mg by mouth as needed for anxiety., Disp: , Rfl:    omega-3 acid ethyl esters (LOVAZA) 1 G capsule, Take 1 g by mouth 2 (two) times daily., Disp: , Rfl:    OVER THE COUNTER MEDICATION, Take 1 tablet by mouth in the morning and at bedtime. Beet Root chew, Disp: , Rfl:    pantoprazole (PROTONIX) 40 MG tablet, Take 1 tablet (40 mg total) by mouth daily., Disp: 30 tablet, Rfl: 11   polyethylene glycol (MIRALAX) 17 g packet, Take  17 g by mouth daily., Disp: , Rfl:    zolpidem (AMBIEN) 10 MG tablet, Take 1 tablet (10 mg total) by mouth at bedtime as needed. For sleep (Patient taking differently: Take 5-10 mg by mouth at bedtime as needed for sleep.), Disp: 30 tablet, Rfl: 5  Past Medical History: Past Medical History:  Diagnosis Date   Anxiety    Arthritis    "wrists" (07/27/2014)   Coronary artery disease    a.  Pt with 4 stents over the course of 2003-2012;  b.  LHC 6/16:  LAD, RCA, LCx stents patent, OM1 50%, EF 50-55% >> med rx    Elevated LFTs    GERD (gastroesophageal reflux disease)    Hypercholesterolemia    Hypertension    Ischemic cardiomyopathy    cMRI 03/2011:  EF 48% with dist ant, septal, apical and inf-apical dyskinesia, no apical clot, dist ant, apical and septal full thickness scar >> No ICD needed  //  Limited echo 7/19: Large apical septal aneurysm, severe hypokinesis of the entire apex and mid to apical segments of the anterior wall, anterolateral wall and anterior septum-consistent with infarct and most of the LAD territory; EF 35    Left ventricular aneurysm    Limited echo 7/19: Large apical septal aneurysm, severe hypokinesis of the entire apex and mid to apical segments of the anterior wall, anterolateral wall and anterior septum-consistent with infarct and most of  the LAD territory; EF 35   Myocardial infarction (HCC) 2003    Tobacco Use: Social History   Tobacco Use  Smoking Status Former   Packs/day: 1.00   Years: 15.00   Additional pack years: 0.00   Total pack years: 15.00   Types: Cigarettes  Smokeless Tobacco Never  Tobacco Comments   "quit smoking cigarettes in 1979"    Labs: Review Flowsheet  More data exists      Latest Ref Rng & Units 01/08/2011 06/11/2012 06/16/2019 05/06/2022 05/08/2022  Labs for ITP Cardiac and Pulmonary Rehab  Cholestrol 100 - 199 mg/dL 161  096  045  - -  LDL (calc) 0 - 99 mg/dL 77  73  409  - -  HDL-C >39 mg/dL 30  34  38  - -  Trlycerides 0 -  149 mg/dL 811  914  782  - -  Hemoglobin A1c 4.8 - 5.6 % 6.0  - - 7.0  -  PH, Arterial 7.35 - 7.45 - - - - 7.360  7.336  7.368  7.368  7.387  7.341   PCO2 arterial 32 - 48 mmHg - - - - 37.2  37.4  35.2  37.7  37.8  40.9   Bicarbonate 20.0 - 28.0 mmol/L - - - - 21.0  19.9  20.6  21.7  22.7  21.7  22.1   TCO2 22 - 32 mmol/L - - - - 22  21  22  24  23  26  24  27  23  23  23  23    Acid-base deficit 0.0 - 2.0 mmol/L - - - - 4.0  5.0  5.0  3.0  2.0  4.0  3.0   O2 Saturation % - - - - 99  97  99  100  100  88  100     Capillary Blood Glucose: Lab Results  Component Value Date   GLUCAP 120 (H) 05/12/2022   GLUCAP 102 (H) 05/12/2022   GLUCAP 138 (H) 05/11/2022   GLUCAP 118 (H) 05/11/2022   GLUCAP 112 (H) 05/11/2022     Exercise Target Goals: Exercise Program Goal: Individual exercise prescription set using results from initial 6 min walk test and THRR while considering  patient's activity barriers and safety.   Exercise Prescription Goal: Initial exercise prescription builds to 30-45 minutes a day of aerobic activity, 2-3 days per week.  Home exercise guidelines will be given to patient during program as part of exercise prescription that the participant will acknowledge.  Activity Barriers & Risk Stratification:  Activity Barriers & Cardiac Risk Stratification - 06/26/22 1316       Activity Barriers & Cardiac Risk Stratification   Activity Barriers Deconditioning;Muscular Weakness;Shortness of Breath;Incisional Pain;Other (comment)    Comments Sternal Precautions    Cardiac Risk Stratification High             6 Minute Walk:  6 Minute Walk     Row Name 06/26/22 1213         6 Minute Walk   Phase Initial     Distance 1038 feet     Walk Time 6 minutes     # of Rest Breaks 0     MPH 2     METS 1.93     RPE 11     Perceived Dyspnea  1     VO2 Peak 6.8     Symptoms Yes (comment)     Comments Rt lower leg surgical pain  3/10, SOB RPD = 1     Resting HR 72 bpm      Resting BP 112/70     Resting Oxygen Saturation  98 %     Exercise Oxygen Saturation  during 6 min walk 98 %     Max Ex. HR 98 bpm     Max Ex. BP 122/78     2 Minute Post BP 118/70              Oxygen Initial Assessment:   Oxygen Re-Evaluation:   Oxygen Discharge (Final Oxygen Re-Evaluation):   Initial Exercise Prescription:  Initial Exercise Prescription - 06/26/22 1300       Date of Initial Exercise RX and Referring Provider   Date 06/26/22    Referring Provider Charlton Haws, MD    Expected Discharge Date 09/05/22      NuStep   Level 1    SPM 75    Minutes 15    METs 1.9      Track   Laps 8    Minutes 15    METs 1.9      Prescription Details   Frequency (times per week) 3x    Duration Progress to 30 minutes of continuous aerobic without signs/symptoms of physical distress      Intensity   THRR 40-80% of Max Heartrate 58-115    Ratings of Perceived Exertion 11-13    Perceived Dyspnea 0-4      Progression   Progression Continue progressive overload as per policy without signs/symptoms or physical distress.      Resistance Training   Training Prescription Yes    Weight 2 lbs    Reps 10-15             Perform Capillary Blood Glucose checks as needed.  Exercise Prescription Changes:   Exercise Comments:   Exercise Goals and Review:   Exercise Goals     Row Name 06/26/22 1253             Exercise Goals   Increase Physical Activity Yes       Intervention Provide advice, education, support and counseling about physical activity/exercise needs.;Develop an individualized exercise prescription for aerobic and resistive training based on initial evaluation findings, risk stratification, comorbidities and participant's personal goals.       Expected Outcomes Short Term: Attend rehab on a regular basis to increase amount of physical activity.;Long Term: Add in home exercise to make exercise part of routine and to increase amount of physical  activity.;Long Term: Exercising regularly at least 3-5 days a week.       Increase Strength and Stamina Yes       Intervention Develop an individualized exercise prescription for aerobic and resistive training based on initial evaluation findings, risk stratification, comorbidities and participant's personal goals.;Provide advice, education, support and counseling about physical activity/exercise needs.       Expected Outcomes Short Term: Increase workloads from initial exercise prescription for resistance, speed, and METs.;Short Term: Perform resistance training exercises routinely during rehab and add in resistance training at home;Long Term: Improve cardiorespiratory fitness, muscular endurance and strength as measured by increased METs and functional capacity ( )       Able to understand and use rate of perceived exertion (RPE) scale Yes       Intervention Provide education and explanation on how to use RPE scale       Expected Outcomes Short Term: Able to use RPE daily in rehab to express subjective intensity  level;Long Term:  Able to use RPE to guide intensity level when exercising independently       Knowledge and understanding of Target Heart Rate Range (THRR) Yes       Intervention Provide education and explanation of THRR including how the numbers were predicted and where they are located for reference       Expected Outcomes Short Term: Able to state/look up THRR;Long Term: Able to use THRR to govern intensity when exercising independently;Short Term: Able to use daily as guideline for intensity in rehab       Understanding of Exercise Prescription Yes       Intervention Provide education, explanation, and written materials on patient's individual exercise prescription       Expected Outcomes Short Term: Able to explain program exercise prescription;Long Term: Able to explain home exercise prescription to exercise independently                Exercise Goals Re-Evaluation  :   Discharge Exercise Prescription (Final Exercise Prescription Changes):   Nutrition:  Target Goals: Understanding of nutrition guidelines, daily intake of sodium 1500mg , cholesterol 200mg , calories 30% from fat and 7% or less from saturated fats, daily to have 5 or more servings of fruits and vegetables.  Biometrics:  Pre Biometrics - 06/26/22 1237       Pre Biometrics   Waist Circumference 42 inches    Hip Circumference 39 inches    Waist to Hip Ratio 1.08 %    Triceps Skinfold 15 mm    Grip Strength 38 kg    Flexibility 0 in   unable to reach   Single Leg Stand 15 seconds              Nutrition Therapy Plan and Nutrition Goals:   Nutrition Assessments:  MEDIFICTS Score Key: ?70 Need to make dietary changes  40-70 Heart Healthy Diet ? 40 Therapeutic Level Cholesterol Diet    Picture Your Plate Scores: <16 Unhealthy dietary pattern with much room for improvement. 41-50 Dietary pattern unlikely to meet recommendations for good health and room for improvement. 51-60 More healthful dietary pattern, with some room for improvement.  >60 Healthy dietary pattern, although there may be some specific behaviors that could be improved.    Nutrition Goals Re-Evaluation:   Nutrition Goals Re-Evaluation:   Nutrition Goals Discharge (Final Nutrition Goals Re-Evaluation):   Psychosocial: Target Goals: Acknowledge presence or absence of significant depression and/or stress, maximize coping skills, provide positive support system. Participant is able to verbalize types and ability to use techniques and skills needed for reducing stress and depression.  Initial Review & Psychosocial Screening:  Initial Psych Review & Screening - 06/26/22 1312       Initial Review   Current issues with Current Sleep Concerns      Family Dynamics   Good Support System? Yes   Pt has his significant other Linda     Barriers   Psychosocial barriers to participate in program There  are no identifiable barriers or psychosocial needs.      Screening Interventions   Interventions Encouraged to exercise             Quality of Life Scores:  Quality of Life - 06/26/22 1329       Quality of Life   Select Quality of Life      Quality of Life Scores   Health/Function Pre 24.3 %    Socioeconomic Pre 29.14 %    Psych/Spiritual Pre 27.43 %  Family Pre 28.8 %    GLOBAL Pre 26.6 %            Scores of 19 and below usually indicate a poorer quality of life in these areas.  A difference of  2-3 points is a clinically meaningful difference.  A difference of 2-3 points in the total score of the Quality of Life Index has been associated with significant improvement in overall quality of life, self-image, physical symptoms, and general health in studies assessing change in quality of life.  PHQ-9: Review Flowsheet       06/26/2022 04/02/2018  Depression screen PHQ 2/9  Decreased Interest 0 0  Down, Depressed, Hopeless 0 0  PHQ - 2 Score 0 0  Altered sleeping 2 -  Tired, decreased energy 2 -  Change in appetite 0 -  Feeling bad or failure about yourself  0 -  Trouble concentrating 0 -  Moving slowly or fidgety/restless 1 -  Suicidal thoughts 0 -  PHQ-9 Score 5 -  Difficult doing work/chores Somewhat difficult -   Interpretation of Total Score  Total Score Depression Severity:  1-4 = Minimal depression, 5-9 = Mild depression, 10-14 = Moderate depression, 15-19 = Moderately severe depression, 20-27 = Severe depression   Psychosocial Evaluation and Intervention:   Psychosocial Re-Evaluation:   Psychosocial Discharge (Final Psychosocial Re-Evaluation):   Vocational Rehabilitation: Provide vocational rehab assistance to qualifying candidates.   Vocational Rehab Evaluation & Intervention:  Vocational Rehab - 06/26/22 1314       Initial Vocational Rehab Evaluation & Intervention   Assessment shows need for Vocational Rehabilitation No   Pt denies any  vocational needs            Education: Education Goals: Education classes will be provided on a weekly basis, covering required topics. Participant will state understanding/return demonstration of topics presented.     Core Videos: Exercise    Move It!  Clinical staff conducted group or individual video education with verbal and written material and guidebook.  Patient learns the recommended Pritikin exercise program. Exercise with the goal of living a long, healthy life. Some of the health benefits of exercise include controlled diabetes, healthier blood pressure levels, improved cholesterol levels, improved heart and lung capacity, improved sleep, and better body composition. Everyone should speak with their doctor before starting or changing an exercise routine.  Biomechanical Limitations Clinical staff conducted group or individual video education with verbal and written material and guidebook.  Patient learns how biomechanical limitations can impact exercise and how we can mitigate and possibly overcome limitations to have an impactful and balanced exercise routine.  Body Composition Clinical staff conducted group or individual video education with verbal and written material and guidebook.  Patient learns that body composition (ratio of muscle mass to fat mass) is a key component to assessing overall fitness, rather than body weight alone. Increased fat mass, especially visceral belly fat, can put [place] at increased risk for metabolic syndrome, type 2 diabetes, heart disease, and even death. It is recommended to combine diet and exercise (cardiovascular and resistance training) to improve your body composition. Seek guidance from your physician and exercise physiologist before implementing an exercise routine.  Exercise Action Plan Clinical staff conducted group or individual video education with verbal and written material and guidebook.  Patient learns the recommended strategies to  achieve and enjoy long-term exercise adherence, including variety, self-motivation, self-efficacy, and positive decision making. Benefits of exercise include fitness, good health, weight management, more energy, better  sleep, less stress, and overall well-being.  Medical   Heart Disease Risk Reduction Clinical staff conducted group or individual video education with verbal and written material and guidebook.  Patient learns our heart is our most vital organ as it circulates oxygen, nutrients, white blood cells, and hormones throughout the entire body, and carries waste away. Data supports a plant-based eating plan like the Pritikin Program for its effectiveness in slowing progression of and reversing heart disease. The video provides a number of recommendations to address heart disease.   Metabolic Syndrome and Belly Fat  Clinical staff conducted group or individual video education with verbal and written material and guidebook.  Patient learns what metabolic syndrome is, how it leads to heart disease, and how one can reverse it and keep it from coming back. You have metabolic syndrome if you have 3 of the following 5 criteria: abdominal obesity, high blood pressure, high triglycerides, low HDL cholesterol, and high blood sugar.  Hypertension and Heart Disease Clinical staff conducted group or individual video education with verbal and written material and guidebook.  Patient learns that high blood pressure, or hypertension, is very common in the [place]. Hypertension is largely due to excessive salt intake, but other important risk factors include being overweight, physical inactivity, drinking too much alcohol, smoking, and not eating enough potassium from fruits and vegetables. High blood pressure is a leading risk factor for heart attack, stroke, congestive heart failure, dementia, kidney failure, and premature death. Long-term effects of excessive salt intake include stiffening of the  arteries and thickening of heart muscle and organ damage. Recommendations include ways to reduce hypertension and the risk of heart disease.  Diseases of Our Time - Focusing on Diabetes Clinical staff conducted group or individual video education with verbal and written material and guidebook.  Patient learns why the best way to stop diseases of our time is prevention, through food and other lifestyle changes. Medicine (such as prescription pills and surgeries) is often only a Band-Aid on the problem, not a long-term solution. Most common diseases of our time include obesity, type 2 diabetes, hypertension, heart disease, and cancer. The Pritikin Program is recommended and has been proven to help reduce, reverse, and/or prevent the damaging effects of metabolic syndrome.  Nutrition   Overview of the Pritikin Eating Plan  Clinical staff conducted group or individual video education with verbal and written material and guidebook.  Patient learns about the Pritikin Eating Plan for disease risk reduction. The Pritikin Eating Plan emphasizes a wide variety of unrefined, minimally-processed carbohydrates, like fruits, vegetables, whole grains, and legumes. Go, Caution, and Stop food choices are explained. Plant-based and lean animal proteins are emphasized. Rationale provided for low sodium intake for blood pressure control, low added sugars for blood sugar stabilization, and low added fats and oils for coronary artery disease risk reduction and weight management.  Calorie Density  Clinical staff conducted group or individual video education with verbal and written material and guidebook.  Patient learns about calorie density and how it impacts the Pritikin Eating Plan. Knowing the characteristics of the food you choose will help you decide whether those foods will lead to weight gain or weight loss, and whether you want to consume more or less of them. Weight loss is usually a side effect of the Pritikin  Eating Plan because of its focus on low calorie-dense foods.  Label Reading  Clinical staff conducted group or individual video education with verbal and written material and guidebook.  Patient learns  about the Pritikin recommended label reading guidelines and corresponding recommendations regarding calorie density, added sugars, sodium content, and whole grains.  Dining Out - Part 1  Clinical staff conducted group or individual video education with verbal and written material and guidebook.  Patient learns that restaurant meals can be sabotaging because they can be so high in calories, fat, sodium, and/or sugar. Patient learns recommended strategies on how to positively address this and avoid unhealthy pitfalls.  Facts on Fats  Clinical staff conducted group or individual video education with verbal and written material and guidebook.  Patient learns that lifestyle modifications can be just as effective, if not more so, as many medications for lowering your risk of heart disease. A Pritikin lifestyle can help to reduce your risk of inflammation and atherosclerosis (cholesterol build-up, or plaque, in the artery walls). Lifestyle interventions such as dietary choices and physical activity address the cause of atherosclerosis. A review of the types of fats and their impact on blood cholesterol levels, along with dietary recommendations to reduce fat intake is also included.  Nutrition Action Plan  Clinical staff conducted group or individual video education with verbal and written material and guidebook.  Patient learns how to incorporate Pritikin recommendations into their lifestyle. Recommendations include planning and keeping personal health goals in mind as an important part of their success.  Healthy Mind-Set    Healthy Minds, Bodies, Hearts  Clinical staff conducted group or individual video education with verbal and written material and guidebook.  Patient learns how to identify when they  are stressed. Video will discuss the impact of that stress, as well as the many benefits of stress management. Patient will also be introduced to stress management techniques. The way we think, act, and feel has an impact on our hearts.  How Our Thoughts Can Heal Our Hearts  Clinical staff conducted group or individual video education with verbal and written material and guidebook.  Patient learns that negative thoughts can cause depression and anxiety. This can result in negative lifestyle behavior and serious health problems. Cognitive behavioral therapy is an effective method to help control our thoughts in order to change and improve our emotional outlook.  Additional Videos:  Exercise    Improving Performance  Clinical staff conducted group or individual video education with verbal and written material and guidebook.  Patient learns to use a non-linear approach by alternating intensity levels and lengths of time spent exercising to help burn more calories and lose more body fat. Cardiovascular exercise helps improve heart health, metabolism, hormonal balance, blood sugar control, and recovery from fatigue. Resistance training improves strength, endurance, balance, coordination, reaction time, metabolism, and muscle mass. Flexibility exercise improves circulation, posture, and balance. Seek guidance from your physician and exercise physiologist before implementing an exercise routine and learn your capabilities and proper form for all exercise.  Introduction to Yoga  Clinical staff conducted group or individual video education with verbal and written material and guidebook.  Patient learns about yoga, a discipline of the coming together of mind, breath, and body. The benefits of yoga include improved flexibility, improved range of motion, better posture and core strength, increased lung function, weight loss, and positive self-image. Yoga's heart health benefits include lowered blood pressure,  healthier heart rate, decreased cholesterol and triglyceride levels, improved immune function, and reduced stress. Seek guidance from your physician and exercise physiologist before implementing an exercise routine and learn your capabilities and proper form for all exercise.  Medical   Aging: Enhancing Your Quality  of Life  Clinical staff conducted group or individual video education with verbal and written material and guidebook.  Patient learns key strategies and recommendations to stay in good physical health and enhance quality of life, such as prevention strategies, having an advocate, securing a Health Care Proxy and Power of Attorney, and keeping a list of medications and system for tracking them. It also discusses how to avoid risk for bone loss.  Biology of Weight Control  Clinical staff conducted group or individual video education with verbal and written material and guidebook.  Patient learns that weight gain occurs because we consume more calories than we burn (eating more, moving less). Even if your body weight is normal, you may have higher ratios of fat compared to muscle mass. Too much body fat puts you at increased risk for cardiovascular disease, heart attack, stroke, type 2 diabetes, and obesity-related cancers. In addition to exercise, following the Pritikin Eating Plan can help reduce your risk.  Decoding Lab Results  Clinical staff conducted group or individual video education with verbal and written material and guidebook.  Patient learns that lab test reflects one measurement whose values change over time and are influenced by many factors, including medication, stress, sleep, exercise, food, hydration, pre-existing medical conditions, and more. It is recommended to use the knowledge from this video to become more involved with your lab results and evaluate your numbers to speak with your doctor.   Diseases of Our Time - Overview  Clinical staff conducted group or  individual video education with verbal and written material and guidebook.  Patient learns that according to the CDC, 50% to 70% of chronic diseases (such as obesity, type 2 diabetes, elevated lipids, hypertension, and heart disease) are avoidable through lifestyle improvements including healthier food choices, listening to satiety cues, and increased physical activity.  Sleep Disorders Clinical staff conducted group or individual video education with verbal and written material and guidebook.  Patient learns how good quality and duration of sleep are important to overall health and well-being. Patient also learns about sleep disorders and how they impact health along with recommendations to address them, including discussing with a physician.  Nutrition  Dining Out - Part 2 Clinical staff conducted group or individual video education with verbal and written material and guidebook.  Patient learns how to plan ahead and communicate in order to maximize their dining experience in a healthy and nutritious manner. Included are recommended food choices based on the type of restaurant the patient is visiting.   Fueling a Banker conducted group or individual video education with verbal and written material and guidebook.  There is a strong connection between our food choices and our health. Diseases like obesity and type 2 diabetes are very prevalent and are in large-part due to lifestyle choices. The Pritikin Eating Plan provides plenty of food and hunger-curbing satisfaction. It is easy to follow, affordable, and helps reduce health risks.  Menu Workshop  Clinical staff conducted group or individual video education with verbal and written material and guidebook.  Patient learns that restaurant meals can sabotage health goals because they are often packed with calories, fat, sodium, and sugar. Recommendations include strategies to plan ahead and to communicate with the manager,  chef, or server to help order a healthier meal.  Planning Your Eating Strategy  Clinical staff conducted group or individual video education with verbal and written material and guidebook.  Patient learns about the Pritikin Eating Plan and its benefit of  reducing the risk of disease. The Pritikin Eating Plan does not focus on calories. Instead, it emphasizes high-quality, nutrient-rich foods. By knowing the characteristics of the foods, we choose, we can determine their calorie density and make informed decisions.  Targeting Your Nutrition Priorities  Clinical staff conducted group or individual video education with verbal and written material and guidebook.  Patient learns that lifestyle habits have a tremendous impact on disease risk and progression. This video provides eating and physical activity recommendations based on your personal health goals, such as reducing LDL cholesterol, losing weight, preventing or controlling type 2 diabetes, and reducing high blood pressure.  Vitamins and Minerals  Clinical staff conducted group or individual video education with verbal and written material and guidebook.  Patient learns different ways to obtain key vitamins and minerals, including through a recommended healthy diet. It is important to discuss all supplements you take with your doctor.   Healthy Mind-Set    Smoking Cessation  Clinical staff conducted group or individual video education with verbal and written material and guidebook.  Patient learns that cigarette smoking and tobacco addiction pose a serious health risk which affects millions of people. Stopping smoking will significantly reduce the risk of heart disease, lung disease, and many forms of cancer. Recommended strategies for quitting are covered, including working with your doctor to develop a successful plan.  Culinary   Becoming a Set designer conducted group or individual video education with verbal and written  material and guidebook.  Patient learns that cooking at home can be healthy, cost-effective, quick, and puts them in control. Keys to cooking healthy recipes will include looking at your recipe, assessing your equipment needs, planning ahead, making it simple, choosing cost-effective seasonal ingredients, and limiting the use of added fats, salts, and sugars.  Cooking - Breakfast and Snacks  Clinical staff conducted group or individual video education with verbal and written material and guidebook.  Patient learns how important breakfast is to satiety and nutrition through the entire day. Recommendations include key foods to eat during breakfast to help stabilize blood sugar levels and to prevent overeating at meals later in the day. Planning ahead is also a key component.  Cooking - Educational psychologist conducted group or individual video education with verbal and written material and guidebook.  Patient learns eating strategies to improve overall health, including an approach to cook more at home. Recommendations include thinking of animal protein as a side on your plate rather than center stage and focusing instead on lower calorie dense options like vegetables, fruits, whole grains, and plant-based proteins, such as beans. Making sauces in large quantities to freeze for later and leaving the skin on your vegetables are also recommended to maximize your experience.  Cooking - Healthy Salads and Dressing Clinical staff conducted group or individual video education with verbal and written material and guidebook.  Patient learns that vegetables, fruits, whole grains, and legumes are the foundations of the Pritikin Eating Plan. Recommendations include how to incorporate each of these in flavorful and healthy salads, and how to create homemade salad dressings. Proper handling of ingredients is also covered. Cooking - Soups and [employer] - Soups and Desserts Clinical staff conducted  group or individual video education with verbal and written material and guidebook.  Patient learns that Pritikin soups and desserts make for easy, nutritious, and delicious snacks and meal components that are low in sodium, fat, sugar, and calorie density, while high in vitamins,  minerals, and filling fiber. Recommendations include simple and healthy ideas for soups and desserts.   Overview     The Pritikin Solution Program Overview Clinical staff conducted group or individual video education with verbal and written material and guidebook.  Patient learns that the results of the Pritikin Program have been documented in more than 100 articles published in peer-reviewed journals, and the benefits include reducing risk factors for (and, in some cases, even reversing) high cholesterol, high blood pressure, type 2 diabetes, obesity, and more! An overview of the three key pillars of the Pritikin Program will be covered: eating well, doing regular exercise, and having a healthy mind-set.  WORKSHOPS  Exercise: Exercise Basics: Building Your Action Plan Clinical staff led group instruction and group discussion with PowerPoint presentation and patient guidebook. To enhance the learning environment the use of posters, models and videos may be added. At the conclusion of this workshop, patients will comprehend the difference between physical activity and exercise, as well as the benefits of incorporating both, into their routine. Patients will understand the FITT (Frequency, Intensity, Time, and Type) principle and how to use it to build an exercise action plan. In addition, safety concerns and other considerations for exercise and cardiac rehab will be addressed by the presenter. The purpose of this lesson is to promote a comprehensive and effective weekly exercise routine in order to improve patients' overall level of fitness.   Managing Heart Disease: Your Path to a Healthier Heart Clinical staff led  group instruction and group discussion with PowerPoint presentation and patient guidebook. To enhance the learning environment the use of posters, models and videos may be added.At the conclusion of this workshop, patients will understand the anatomy and physiology of the heart. Additionally, they will understand how Pritikin's three pillars impact the risk factors, the progression, and the management of heart disease.  The purpose of this lesson is to provide a high-level overview of the heart, heart disease, and how the Pritikin lifestyle positively impacts risk factors.  Exercise Biomechanics Clinical staff led group instruction and group discussion with PowerPoint presentation and patient guidebook. To enhance the learning environment the use of posters, models and videos may be added. Patients will learn how the structural parts of their bodies function and how these functions impact their daily activities, movement, and exercise. Patients will learn how to promote a neutral spine, learn how to manage pain, and identify ways to improve their physical movement in order to promote healthy living. The purpose of this lesson is to expose patients to common physical limitations that impact physical activity. Participants will learn practical ways to adapt and manage aches and pains, and to minimize their effect on regular exercise. Patients will learn how to maintain good posture while sitting, walking, and lifting.  Balance Training and Fall Prevention  Clinical staff led group instruction and group discussion with PowerPoint presentation and patient guidebook. To enhance the learning environment the use of posters, models and videos may be added. At the conclusion of this workshop, patients will understand the importance of their sensorimotor skills (vision, proprioception, and the vestibular system) in maintaining their ability to balance as they age. Patients will apply a variety of balancing  exercises that are appropriate for their current level of function. Patients will understand the common causes for poor balance, possible solutions to these problems, and ways to modify their physical environment in order to minimize their fall risk. The purpose of this lesson is to teach patients about the importance  of maintaining balance as they age and ways to minimize their risk of falling.  WORKSHOPS   Nutrition:  Fueling a Ship broker led group instruction and group discussion with PowerPoint presentation and patient guidebook. To enhance the learning environment the use of posters, models and videos may be added. Patients will review the foundational principles of the Pritikin Eating Plan and understand what constitutes a serving size in each of the food groups. Patients will also learn Pritikin-friendly foods that are better choices when away from home and review make-ahead meal and snack options. Calorie density will be reviewed and applied to three nutrition priorities: weight maintenance, weight loss, and weight gain. The purpose of this lesson is to reinforce (in a group setting) the key concepts around what patients are recommended to eat and how to apply these guidelines when away from home by planning and selecting Pritikin-friendly options. Patients will understand how calorie density may be adjusted for different weight management goals.  Mindful Eating  Clinical staff led group instruction and group discussion with PowerPoint presentation and patient guidebook. To enhance the learning environment the use of posters, models and videos may be added. Patients will briefly review the concepts of the Pritikin Eating Plan and the importance of low-calorie dense foods. The concept of mindful eating will be introduced as well as the importance of paying attention to internal hunger signals. Triggers for non-hunger eating and techniques for dealing with triggers will be explored.  The purpose of this lesson is to provide patients with the opportunity to review the basic principles of the Pritikin Eating Plan, discuss the value of eating mindfully and how to measure internal cues of hunger and fullness using the Hunger Scale. Patients will also discuss reasons for non-hunger eating and learn strategies to use for controlling emotional eating.  Targeting Your Nutrition Priorities Clinical staff led group instruction and group discussion with PowerPoint presentation and patient guidebook. To enhance the learning environment the use of posters, models and videos may be added. Patients will learn how to determine their genetic susceptibility to disease by reviewing their family history. Patients will gain insight into the importance of diet as part of an overall healthy lifestyle in mitigating the impact of genetics and other environmental insults. The purpose of this lesson is to provide patients with the opportunity to assess their personal nutrition priorities by looking at their family history, their own health history and current risk factors. Patients will also be able to discuss ways of prioritizing and modifying the Pritikin Eating Plan for their highest risk areas  Menu  Clinical staff led group instruction and group discussion with PowerPoint presentation and patient guidebook. To enhance the learning environment the use of posters, models and videos may be added. Using menus brought in from [employer], or printed from [employer], patients will apply the Pritikin dining out guidelines that were presented in the [employer] video. Patients will also be able to practice these guidelines in a variety of provided scenarios. The purpose of this lesson is to provide patients with the opportunity to practice hands-on learning of the Pritikin Dining Out guidelines with actual menus and practice scenarios.  Label Reading Clinical staff led group instruction  and group discussion with PowerPoint presentation and patient guidebook. To enhance the learning environment the use of posters, models and videos may be added. Patients will review and discuss the Pritikin label reading guidelines presented in Pritikin's Label Reading Educational series video. Using fool labels  brought in from local grocery stores and markets, patients will apply the label reading guidelines and determine if the packaged food meet the Pritikin guidelines. The purpose of this lesson is to provide patients with the opportunity to review, discuss, and practice hands-on learning of the Pritikin Label Reading guidelines with actual packaged food labels. Cooking School  Pritikin's [employer] are designed to teach patients ways to prepare quick, simple, and affordable recipes at home. The importance of nutrition's role in chronic disease risk reduction is reflected in its emphasis in the overall Pritikin program. By learning how to prepare essential core Pritikin Eating Plan recipes, patients will increase control over what they eat; be able to customize the flavor of foods without the use of added salt, sugar, or fat; and improve the quality of the food they consume. By learning a set of core recipes which are easily assembled, quickly prepared, and affordable, patients are more likely to prepare more healthy foods at home. These workshops focus on convenient breakfasts, simple entres, side dishes, and desserts which can be prepared with minimal effort and are consistent with nutrition recommendations for cardiovascular risk reduction. Cooking [employer] are taught by a Armed forces logistics/support/administrative officer (RD) who has been trained by the [employer]. The chef or RD has a clear understanding of the importance of minimizing - if not completely eliminating - added fat, sugar, and sodium in recipes. Throughout the series of Cooking School Workshop sessions, patients will learn  about healthy ingredients and efficient methods of cooking to build confidence in their capability to prepare    Cooking School weekly topics:  Adding Flavor- Sodium-Free  Fast and Healthy Breakfasts  Powerhouse Plant-Based Proteins  Satisfying Salads and Dressings  Simple Sides and Sauces  International Cuisine-Spotlight on the [employer] Zones  Delicious Desserts  Savory Soups  [employer] - Meals in a Astronomer Appetizers and Snacks  Comforting Weekend Breakfasts  One-Pot Wonders   Fast Evening Meals  Landscape architect Your Pritikin Plate  WORKSHOPS   Healthy Mindset (Psychosocial):  Focused Goals, Sustainable Changes Clinical staff led group instruction and group discussion with PowerPoint presentation and patient guidebook. To enhance the learning environment the use of posters, models and videos may be added. Patients will be able to apply effective goal setting strategies to establish at least one personal goal, and then take consistent, meaningful action toward that goal. They will learn to identify common barriers to achieving personal goals and develop strategies to overcome them. Patients will also gain an understanding of how our mind-set can impact our ability to achieve goals and the importance of cultivating a positive and growth-oriented mind-set. The purpose of this lesson is to provide patients with a deeper understanding of how to set and achieve personal goals, as well as the tools and strategies needed to overcome common obstacles which may arise along the way.  From Head to Heart: The Power of a Healthy Outlook  Clinical staff led group instruction and group discussion with PowerPoint presentation and patient guidebook. To enhance the learning environment the use of posters, models and videos may be added. Patients will be able to recognize and describe the impact of emotions and mood on physical health. They will discover the importance of  self-care and explore self-care practices which may work for them. Patients will also learn how to utilize the 4 C's to cultivate a healthier outlook and better manage stress and challenges. The purpose of this lesson  is to demonstrate to patients how a healthy outlook is an essential part of maintaining good health, especially as they continue their cardiac rehab journey.  Healthy Sleep for a Healthy Heart Clinical staff led group instruction and group discussion with PowerPoint presentation and patient guidebook. To enhance the learning environment the use of posters, models and videos may be added. At the conclusion of this workshop, patients will be able to demonstrate knowledge of the importance of sleep to overall health, well-being, and quality of life. They will understand the symptoms of, and treatments for, common sleep disorders. Patients will also be able to identify daytime and nighttime behaviors which impact sleep, and they will be able to apply these tools to help manage sleep-related challenges. The purpose of this lesson is to provide patients with a general overview of sleep and outline the importance of quality sleep. Patients will learn about a few of the most common sleep disorders. Patients will also be introduced to the concept of "sleep hygiene," and discover ways to self-manage certain sleeping problems through simple daily behavior changes. Finally, the workshop will motivate patients by clarifying the links between quality sleep and their goals of heart-healthy living.   Recognizing and Reducing Stress Clinical staff led group instruction and group discussion with PowerPoint presentation and patient guidebook. To enhance the learning environment the use of posters, models and videos may be added. At the conclusion of this workshop, patients will be able to understand the types of stress reactions, differentiate between acute and chronic stress, and recognize the impact that chronic  stress has on their health. They will also be able to apply different coping mechanisms, such as reframing negative self-talk. Patients will have the opportunity to practice a variety of stress management techniques, such as deep abdominal breathing, progressive muscle relaxation, and/or guided imagery.  The purpose of this lesson is to educate patients on the role of stress in their lives and to provide healthy techniques for coping with it.  Learning Barriers/Preferences:  Learning Barriers/Preferences - 06/26/22 1314       Learning Barriers/Preferences   Learning Barriers Sight    Learning Preferences Skilled Demonstration;Verbal Instruction;Written Material;Audio;Computer/Internet;Group Instruction;Individual Instruction;Pictoral;Video             Education Topics:  Knowledge Questionnaire Score:  Knowledge Questionnaire Score - 06/26/22 1330       Knowledge Questionnaire Score   Pre Score 19/24             Core Components/Risk Factors/Patient Goals at Admission:  Personal Goals and Risk Factors at Admission - 06/26/22 1315       Core Components/Risk Factors/Patient Goals on Admission   Hypertension Yes    Intervention Monitor prescription use compliance.;Provide education on lifestyle modifcations including regular physical activity/exercise, weight management, moderate sodium restriction and increased consumption of fresh fruit, vegetables, and low fat dairy, alcohol moderation, and smoking cessation.    Expected Outcomes Short Term: Continued assessment and intervention until BP is < 140/15mm HG in hypertensive participants. < 130/38mm HG in hypertensive participants with diabetes, heart failure or chronic kidney disease.;Long Term: Maintenance of blood pressure at goal levels.    Lipids Yes    Intervention Provide education and support for participant on nutrition & aerobic/resistive exercise along with prescribed medications to achieve LDL 70mg , HDL >40mg .     Expected Outcomes Short Term: Participant states understanding of desired cholesterol values and is compliant with medications prescribed. Participant is following exercise prescription and nutrition guidelines.;Long Term: Cholesterol controlled with medications  as prescribed, with individualized exercise RX and with personalized nutrition plan. Value goals: LDL < 70mg , HDL > 40 mg.             Core Components/Risk Factors/Patient Goals Review:    Core Components/Risk Factors/Patient Goals at Discharge (Final Review):    ITP Comments:  ITP Comments     Row Name 06/26/22 1100           ITP Comments Dr. Armanda Magic medical director. Introduction to pritikin education program/intensive cardiac rehab. Initial orientation packet reviewed with patient.                Comments: Pt started cardiac rehab today.  Pt tolerated light exercise without difficulty. VSS, telemetry-Sinus Rhythm, downward QRS, asymptomatic.  Medication list reconciled. Pt denies barriers to medicaiton compliance.  PSYCHOSOCIAL ASSESSMENT:  PHQ-5. Pt exhibits positive coping skills, hopeful outlook with supportive family. No psychosocial needs identified at this time, no psychosocial interventions necessary.    Pt enjoys exercise and using free weights.   Pt oriented to exercise equipment and routine.    Understanding verbalized.Thayer Headings RN BSN

## 2022-07-04 ENCOUNTER — Encounter (HOSPITAL_COMMUNITY)
Admission: RE | Admit: 2022-07-04 | Discharge: 2022-07-04 | Disposition: A | Discharge status: Home / Self Care | Payer: 59 | Source: Ambulatory Visit | Attending: Cardiovascular Disease | Admitting: Cardiovascular Disease

## 2022-07-04 DIAGNOSIS — Z951 Presence of aortocoronary bypass graft: Secondary | ICD-10-CM

## 2022-07-04 DIAGNOSIS — I214 Non-ST elevation (NSTEMI) myocardial infarction: Secondary | ICD-10-CM

## 2022-07-07 ENCOUNTER — Encounter (HOSPITAL_COMMUNITY)
Admission: RE | Admit: 2022-07-07 | Discharge: 2022-07-07 | Disposition: A | Discharge status: Home / Self Care | Payer: 59 | Source: Ambulatory Visit | Attending: Cardiovascular Disease | Admitting: Cardiovascular Disease

## 2022-07-07 DIAGNOSIS — I214 Non-ST elevation (NSTEMI) myocardial infarction: Secondary | ICD-10-CM

## 2022-07-07 DIAGNOSIS — Z951 Presence of aortocoronary bypass graft: Secondary | ICD-10-CM | POA: Diagnosis not present

## 2022-07-09 ENCOUNTER — Encounter (HOSPITAL_COMMUNITY)
Admission: RE | Admit: 2022-07-09 | Discharge: 2022-07-09 | Disposition: A | Discharge status: Home / Self Care | Payer: 59 | Source: Ambulatory Visit | Attending: Cardiovascular Disease | Admitting: Cardiovascular Disease

## 2022-07-09 DIAGNOSIS — Z951 Presence of aortocoronary bypass graft: Secondary | ICD-10-CM

## 2022-07-09 DIAGNOSIS — I214 Non-ST elevation (NSTEMI) myocardial infarction: Secondary | ICD-10-CM

## 2022-07-11 ENCOUNTER — Encounter (HOSPITAL_COMMUNITY)
Admission: RE | Admit: 2022-07-11 | Discharge: 2022-07-11 | Disposition: A | Discharge status: Home / Self Care | Payer: 59 | Source: Ambulatory Visit | Attending: Cardiovascular Disease | Admitting: Cardiovascular Disease

## 2022-07-11 DIAGNOSIS — Z951 Presence of aortocoronary bypass graft: Secondary | ICD-10-CM | POA: Diagnosis not present

## 2022-07-11 DIAGNOSIS — I214 Non-ST elevation (NSTEMI) myocardial infarction: Secondary | ICD-10-CM

## 2022-07-14 ENCOUNTER — Encounter (HOSPITAL_COMMUNITY)
Admission: RE | Admit: 2022-07-14 | Discharge: 2022-07-14 | Disposition: A | Discharge status: Home / Self Care | Payer: 59 | Source: Ambulatory Visit | Attending: Cardiovascular Disease | Admitting: Cardiovascular Disease

## 2022-07-14 DIAGNOSIS — I214 Non-ST elevation (NSTEMI) myocardial infarction: Secondary | ICD-10-CM

## 2022-07-14 DIAGNOSIS — Z951 Presence of aortocoronary bypass graft: Secondary | ICD-10-CM

## 2022-07-16 ENCOUNTER — Encounter (HOSPITAL_COMMUNITY)
Admission: RE | Admit: 2022-07-16 | Discharge: 2022-07-16 | Disposition: A | Discharge status: Home / Self Care | Payer: 59 | Source: Ambulatory Visit | Attending: Cardiovascular Disease | Admitting: Cardiovascular Disease

## 2022-07-16 DIAGNOSIS — I214 Non-ST elevation (NSTEMI) myocardial infarction: Secondary | ICD-10-CM

## 2022-07-16 DIAGNOSIS — Z951 Presence of aortocoronary bypass graft: Secondary | ICD-10-CM | POA: Diagnosis not present

## 2022-07-18 ENCOUNTER — Encounter (HOSPITAL_COMMUNITY)
Admission: RE | Admit: 2022-07-18 | Discharge: 2022-07-18 | Disposition: A | Discharge status: Home / Self Care | Payer: 59 | Source: Ambulatory Visit | Attending: Cardiovascular Disease | Admitting: Cardiovascular Disease

## 2022-07-18 DIAGNOSIS — I214 Non-ST elevation (NSTEMI) myocardial infarction: Secondary | ICD-10-CM

## 2022-07-18 DIAGNOSIS — Z951 Presence of aortocoronary bypass graft: Secondary | ICD-10-CM

## 2022-07-23 ENCOUNTER — Encounter (HOSPITAL_COMMUNITY)
Admission: RE | Admit: 2022-07-23 | Discharge: 2022-07-23 | Disposition: A | Discharge status: Home / Self Care | Payer: 59 | Source: Ambulatory Visit | Attending: Cardiovascular Disease | Admitting: Cardiovascular Disease

## 2022-07-23 DIAGNOSIS — Z951 Presence of aortocoronary bypass graft: Secondary | ICD-10-CM | POA: Diagnosis not present

## 2022-07-23 DIAGNOSIS — I214 Non-ST elevation (NSTEMI) myocardial infarction: Secondary | ICD-10-CM

## 2022-07-25 ENCOUNTER — Encounter (HOSPITAL_COMMUNITY)
Admission: RE | Admit: 2022-07-25 | Discharge: 2022-07-25 | Disposition: A | Discharge status: Home / Self Care | Payer: 59 | Source: Ambulatory Visit | Attending: Cardiovascular Disease | Admitting: Cardiovascular Disease

## 2022-07-25 DIAGNOSIS — Z951 Presence of aortocoronary bypass graft: Secondary | ICD-10-CM

## 2022-07-25 DIAGNOSIS — I214 Non-ST elevation (NSTEMI) myocardial infarction: Secondary | ICD-10-CM

## 2022-07-28 ENCOUNTER — Encounter (HOSPITAL_COMMUNITY)
Admission: RE | Admit: 2022-07-28 | Discharge: 2022-07-28 | Disposition: A | Discharge status: Home / Self Care | Payer: 59 | Source: Ambulatory Visit | Attending: Cardiovascular Disease | Admitting: Cardiovascular Disease

## 2022-07-28 DIAGNOSIS — Z951 Presence of aortocoronary bypass graft: Secondary | ICD-10-CM | POA: Diagnosis present

## 2022-07-28 DIAGNOSIS — I214 Non-ST elevation (NSTEMI) myocardial infarction: Secondary | ICD-10-CM | POA: Diagnosis present

## 2022-07-29 NOTE — Progress Notes (Signed)
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, Disp: 30 tablet, Rfl: 3   ezetimibe (ZETIA) 10 MG tablet, TAKE 1 TABLET BY MOUTH EVERY DAY, Disp: 90 tablet, Rfl: 3   Fe Fum-Vit C-Vit B12-FA (TRIGELS-F FORTE) CAPS capsule, Take 1 capsule by mouth daily after breakfast., Disp: 30 capsule, Rfl: 2   LORazepam (ATIVAN) 0.5 MG tablet, Take 0.5 mg by mouth as needed for anxiety., Disp: , Rfl:    omega-3 acid ethyl esters (LOVAZA) 1 G capsule, Take 1 g by mouth 2 (two) times daily., Disp: , Rfl:    OVER THE COUNTER MEDICATION, Take 1 tablet by mouth in the morning and at bedtime. Beet Root chew, Disp: , Rfl:    pantoprazole (PROTONIX) 40 MG tablet, Take 1 tablet (40 mg total) by mouth daily., Disp: 30 tablet, Rfl: 11   polyethylene glycol (MIRALAX) 17 g packet, Take 17 g by mouth daily., Disp: , Rfl:    zolpidem (AMBIEN) 10 MG  tablet, Take 1 tablet (10 mg total) by mouth at bedtime as needed. For sleep (Patient taking differently: Take 5-10 mg by mouth at bedtime as needed for sleep.), Disp: 30 tablet, Rfl: 5  Past Medical History: Past Medical History:  Diagnosis Date   Anxiety    Arthritis    "wrists" (07/27/2014)   Coronary artery disease    a.  Pt with 4 stents over the course of 2003-2012;  b.  LHC 6/16:  LAD, RCA, LCx stents patent, OM1 50%, EF 50-55% >> med rx    Elevated LFTs    GERD (gastroesophageal reflux disease)    Hypercholesterolemia    Hypertension    Ischemic cardiomyopathy    cMRI 03/2011:  EF 48% with dist ant, septal, apical and inf-apical dyskinesia, no apical clot, dist ant, apical and septal full thickness scar >> No ICD needed  //  Limited echo 7/19: Large apical septal aneurysm, severe hypokinesis of the entire apex and mid to apical segments of the anterior wall, anterolateral wall and anterior septum-consistent with infarct and most of the LAD territory; EF 35    Left ventricular aneurysm    Limited echo 7/19: Large apical septal aneurysm, severe hypokinesis of the entire apex and mid to apical segments of the anterior wall, anterolateral wall and anterior septum-consistent with infarct and most of the LAD territory; EF 35   Myocardial  infarction (HCC) 2003    Tobacco Use: Social History   Tobacco Use  Smoking Status Former   Packs/day: 1.00   Years: 15.00   Additional pack years: 0.00   Total pack years: 15.00   Types: Cigarettes  Smokeless Tobacco Never  Tobacco Comments   "quit smoking cigarettes in 1979"    Labs: Review Flowsheet  More data exists      Latest Ref Rng & Units 01/08/2011 06/11/2012 06/16/2019 05/06/2022 05/08/2022  Labs for ITP Cardiac and Pulmonary Rehab  Cholestrol 100 - 199 mg/dL 161  096  045  - -  LDL (calc) 0 - 99 mg/dL 77  73  409  - -  HDL-C >39 mg/dL 30  34  38  - -  Trlycerides 0 - 149 mg/dL 811  914  782  - -  Hemoglobin A1c 4.8 - 5.6 % 6.0  -  - 7.0  -  PH, Arterial 7.35 - 7.45 - - - - 7.360  7.336  7.368  7.368  7.387  7.341   PCO2 arterial 32 - 48 mmHg - - - - 37.2  37.4  35.2  37.7  37.8  40.9   Bicarbonate 20.0 - 28.0 mmol/L - - - - 21.0  19.9  20.6  21.7  22.7  21.7  22.1   TCO2 22 - 32 mmol/L - - - - 22  21  22  24  23  26  24  27  23  23  23  23    Acid-base deficit 0.0 - 2.0 mmol/L - - - - 4.0  5.0  5.0  3.0  2.0  4.0  3.0   O2 Saturation % - - - - 99  97  99  100  100  88  100     Capillary Blood Glucose: Lab Results  Component Value Date   GLUCAP 120 (H) 05/12/2022   GLUCAP 102 (H) 05/12/2022   GLUCAP 138 (H) 05/11/2022   GLUCAP 118 (H) 05/11/2022   GLUCAP 112 (H) 05/11/2022     Exercise Target Goals: Exercise Program Goal: Individual exercise prescription set using results from initial 6 min walk test and THRR while considering  patient's activity barriers and safety.   Exercise Prescription Goal: Initial exercise prescription builds to 30-45 minutes a day of aerobic activity, 2-3 days per week.  Home exercise guidelines will be given to patient during program as part of exercise prescription that the participant will acknowledge.  Activity Barriers & Risk Stratification:  Activity Barriers & Cardiac Risk Stratification - 06/26/22 1316       Activity Barriers & Cardiac Risk Stratification   Activity Barriers Deconditioning;Muscular Weakness;Shortness of Breath;Incisional Pain;Other (comment)    Comments Sternal Precautions    Cardiac Risk Stratification High             6 Minute Walk:  6 Minute Walk     Row Name 06/26/22 1213         6 Minute Walk   Phase Initial     Distance 1038 feet     Walk Time 6 minutes     # of Rest Breaks 0     MPH 2     METS 1.93     RPE 11     Perceived Dyspnea  1     VO2 Peak 6.8     Symptoms Yes (comment)     Comments Rt lower leg surgical pain 3/10, SOB RPD = 1  Resting HR 72 bpm     Resting BP 112/70     Resting Oxygen Saturation  98 %     Exercise  Oxygen Saturation  during 6 min walk 98 %     Max Ex. HR 98 bpm     Max Ex. BP 122/78     2 Minute Post BP 118/70              Oxygen Initial Assessment:   Oxygen Re-Evaluation:   Oxygen Discharge (Final Oxygen Re-Evaluation):   Initial Exercise Prescription:  Initial Exercise Prescription - 06/26/22 1300       Date of Initial Exercise RX and Referring Provider   Date 06/26/22    Referring Provider Charlton Haws, MD    Expected Discharge Date 09/05/22      NuStep   Level 1    SPM 75    Minutes 15    METs 1.9      Track   Laps 8    Minutes 15    METs 1.9      Prescription Details   Frequency (times per week) 3x    Duration Progress to 30 minutes of continuous aerobic without signs/symptoms of physical distress      Intensity   THRR 40-80% of Max Heartrate 58-115    Ratings of Perceived Exertion 11-13    Perceived Dyspnea 0-4      Progression   Progression Continue progressive overload as per policy without signs/symptoms or physical distress.      Resistance Training   Training Prescription Yes    Weight 2 lbs    Reps 10-15             Perform Capillary Blood Glucose checks as needed.  Exercise Prescription Changes:   Exercise Prescription Changes     Row Name 07/02/22 1500 07/16/22 1400 07/25/22 1400         Response to Exercise   Blood Pressure (Admit) 130/60 128/62 132/80     Blood Pressure (Exercise) 136/82 112/58 134/62     Blood Pressure (Exit) 110/56 122/58 104/64     Heart Rate (Admit) 108 bpm 70 bpm 77 bpm     Heart Rate (Exercise) 107 bpm 98 bpm 110 bpm     Heart Rate (Exit) 68 bpm 79 bpm 80 bpm     Rating of Perceived Exertion (Exercise) 13 11 11      Symptoms None None None     Comments Pt's first day in the CRP2 program Reviewed METs Reviewed MEts and goals     Duration Continue with 30 min of aerobic exercise without signs/symptoms of physical distress. Continue with 30 min of aerobic exercise without signs/symptoms of  physical distress. Continue with 30 min of aerobic exercise without signs/symptoms of physical distress.     Intensity THRR unchanged THRR unchanged THRR unchanged       Progression   Progression Continue to progress workloads to maintain intensity without signs/symptoms of physical distress. Continue to progress workloads to maintain intensity without signs/symptoms of physical distress. Continue to progress workloads to maintain intensity without signs/symptoms of physical distress.     Average METs 2.55 3.1 3       Resistance Training   Training Prescription No No Yes     Weight No weights on wednesdays No weights on wednesdays 3 lbs     Reps -- -- 10-15     Time -- -- 10 Minutes       Interval Training  Interval Training No No No       NuStep   Level 1 1 2      SPM 100 98 125     Minutes 15 15 15      METs 2.4 3.1 2.8       Track   Laps 13 17 17      Minutes 15 15 15      METs 2.66 3.17 2.8              Exercise Comments:   Exercise Comments     Row Name 07/02/22 1526 07/16/22 1444 07/25/22 1442       Exercise Comments Pt's first day in the CRP2 program. Pt tolerated the initial sesssion well with no complaints. Reviewed METs. Pt is making good progress. Reviewed METs and goals. Pt is making good progress. Will consider increasing his workloads next session.              Exercise Goals and Review:   Exercise Goals     Row Name 06/26/22 1253             Exercise Goals   Increase Physical Activity Yes       Intervention Provide advice, education, support and counseling about physical activity/exercise needs.;Develop an individualized exercise prescription for aerobic and resistive training based on initial evaluation findings, risk stratification, comorbidities and participant's personal goals.       Expected Outcomes Short Term: Attend rehab on a regular basis to increase amount of physical activity.;Long Term: Add in home exercise to make exercise part of  routine and to increase amount of physical activity.;Long Term: Exercising regularly at least 3-5 days a week.       Increase Strength and Stamina Yes       Intervention Develop an individualized exercise prescription for aerobic and resistive training based on initial evaluation findings, risk stratification, comorbidities and participant's personal goals.;Provide advice, education, support and counseling about physical activity/exercise needs.       Expected Outcomes Short Term: Increase workloads from initial exercise prescription for resistance, speed, and METs.;Short Term: Perform resistance training exercises routinely during rehab and add in resistance training at home;Long Term: Improve cardiorespiratory fitness, muscular endurance and strength as measured by increased METs and functional capacity ( )       Able to understand and use rate of perceived exertion (RPE) scale Yes       Intervention Provide education and explanation on how to use RPE scale       Expected Outcomes Short Term: Able to use RPE daily in rehab to express subjective intensity level;Long Term:  Able to use RPE to guide intensity level when exercising independently       Knowledge and understanding of Target Heart Rate Range (THRR) Yes       Intervention Provide education and explanation of THRR including how the numbers were predicted and where they are located for reference       Expected Outcomes Short Term: Able to state/look up THRR;Long Term: Able to use THRR to govern intensity when exercising independently;Short Term: Able to use daily as guideline for intensity in rehab       Understanding of Exercise Prescription Yes       Intervention Provide education, explanation, and written materials on patient's individual exercise prescription       Expected Outcomes Short Term: Able to explain program exercise prescription;Long Term: Able to explain home exercise prescription to exercise independently  Exercise Goals Re-Evaluation :  Exercise Goals Re-Evaluation     Row Name 07/02/22 1524 07/25/22 1441           Exercise Goal Re-Evaluation   Exercise Goals Review Increase Physical Activity;Increase Strength and Stamina;Able to understand and use rate of perceived exertion (RPE) scale;Knowledge and understanding of Target Heart Rate Range (THRR);Understanding of Exercise Prescription Increase Physical Activity;Increase Strength and Stamina;Able to understand and use rate of perceived exertion (RPE) scale;Knowledge and understanding of Target Heart Rate Range (THRR);Understanding of Exercise Prescription      Comments Pt's first day in the CRP2 program. Pt understands the RPE scale, Exercise Rx, and THRR. Reviewed METs and goals. Pt voices he is making progress on his goals of increased strength and stamina.      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 Will continue to montior patients and progress workloads as tolerated.               Discharge Exercise Prescription (Final Exercise Prescription Changes):  Exercise Prescription Changes - 07/25/22 1400       Response to Exercise   Blood Pressure (Admit) 132/80    Blood Pressure (Exercise) 134/62    Blood Pressure (Exit) 104/64    Heart Rate (Admit) 77 bpm    Heart Rate (Exercise) 110 bpm    Heart Rate (Exit) 80 bpm    Rating of Perceived Exertion (Exercise) 11    Symptoms None    Comments Reviewed MEts and goals    Duration Continue with 30 min of aerobic exercise without signs/symptoms of physical distress.    Intensity THRR unchanged      Progression   Progression Continue to progress workloads to maintain intensity without signs/symptoms of physical distress.    Average METs 3      Resistance Training   Training Prescription Yes    Weight 3 lbs    Reps 10-15    Time 10 Minutes      Interval Training   Interval Training No      NuStep   Level 2    SPM 125     Minutes 15    METs 2.8      Track   Laps 17    Minutes 15    METs 2.8             Nutrition:  Target Goals: Understanding of nutrition guidelines, daily intake of sodium 1500mg , cholesterol 200mg , calories 30% from fat and 7% or less from saturated fats, daily to have 5 or more servings of fruits and vegetables.  Biometrics:  Pre Biometrics - 06/26/22 1237       Pre Biometrics   Waist Circumference 42 inches    Hip Circumference 39 inches    Waist to Hip Ratio 1.08 %    Triceps Skinfold 15 mm    Grip Strength 38 kg    Flexibility 0 in   unable to reach   Single Leg Stand 15 seconds              Nutrition Therapy Plan and Nutrition Goals:  Nutrition Therapy & Goals - 07/02/22 1520       Nutrition Therapy   Diet Heart Healthy/Carbohydrate Consistent Diet      Personal Nutrition Goals   Nutrition Goal Patient to identify strategies for reducing cardiovascular risk by attending the [employer] education and nutrition series weekly.    Personal Goal #2 Patient to improve diet quality by using the  plate method as a guide for meal planning to include lean protein/plant protein, fruits, vegetables, whole grains, nonfat dairy as part of a well-balanced diet.    Personal Goal #3 Patient to reduce sodium intake to 1500mg  per day    Comments Patient has started making many dietary changes including increased fish and vegetables. Patient will benefit from participation in intensive cardiac rehab for nutrition, exercise, and lifestyle modification.      Intervention Plan   Intervention Prescribe, educate and counsel regarding individualized specific dietary modifications aiming towards targeted core components such as weight, hypertension, lipid management, diabetes, heart failure and other comorbidities.;Nutrition handout(s) given to patient.    Expected Outcomes Short Term Goal: Understand basic principles of dietary content, such as calories, fat, sodium, cholesterol and  nutrients.;Long Term Goal: Adherence to prescribed nutrition plan.             Nutrition Assessments:  Nutrition Assessments - 07/03/22 0847       Rate Your Plate Scores   Pre Score 68            MEDIFICTS Score Key: ?70 Need to make dietary changes  40-70 Heart Healthy Diet ? 40 Therapeutic Level Cholesterol Diet   Flowsheet Row INTENSIVE CARDIAC REHAB from 07/02/2022 in [facility]  Picture Your Plate Total Score on Admission 68      Picture Your Plate Scores: <16 Unhealthy dietary pattern with much room for improvement. 41-50 Dietary pattern unlikely to meet recommendations for good health and room for improvement. 51-60 More healthful dietary pattern, with some room for improvement.  >60 Healthy dietary pattern, although there may be some specific behaviors that could be improved.    Nutrition Goals Re-Evaluation:  Nutrition Goals Re-Evaluation     Row Name 07/02/22 1520             Goals   Current Weight 179 lb 7.3 oz (81.4 kg)       Comment A1c 7.0, GFR >60, Lipoprotein A WNL, cholesterol 208, triglycerides 276, LDL 115       Expected Outcome Patient has started making many dietary changes including increased fish and vegetables. Per documentation from cardiology follow-up on 5/6, will consider starting PCSK-9 inhibitor. Patient will benefit from participation in intensive cardiac rehab for nutrition, exercise, and lifestyle modification.                Nutrition Goals Re-Evaluation:  Nutrition Goals Re-Evaluation     Row Name 07/02/22 1520             Goals   Current Weight 179 lb 7.3 oz (81.4 kg)       Comment A1c 7.0, GFR >60, Lipoprotein A WNL, cholesterol 208, triglycerides 276, LDL 115       Expected Outcome Patient has started making many dietary changes including increased fish and vegetables. Per documentation from cardiology follow-up on 5/6, will consider starting PCSK-9  inhibitor. Patient will benefit from participation in intensive cardiac rehab for nutrition, exercise, and lifestyle modification.                Nutrition Goals Discharge (Final Nutrition Goals Re-Evaluation):  Nutrition Goals Re-Evaluation - 07/02/22 1520       Goals   Current Weight 179 lb 7.3 oz (81.4 kg)    Comment A1c 7.0, GFR >60, Lipoprotein A WNL, cholesterol 208, triglycerides 276, LDL 115    Expected Outcome Patient has started making many dietary changes including  increased fish and vegetables. Per documentation from cardiology follow-up on 5/6, will consider starting PCSK-9 inhibitor. Patient will benefit from participation in intensive cardiac rehab for nutrition, exercise, and lifestyle modification.             Psychosocial: Target Goals: Acknowledge presence or absence of significant depression and/or stress, maximize coping skills, provide positive support system. Participant is able to verbalize types and ability to use techniques and skills needed for reducing stress and depression.  Initial Review & Psychosocial Screening:  Initial Psych Review & Screening - 06/26/22 1312       Initial Review   Current issues with Current Sleep Concerns      Family Dynamics   Good Support System? Yes   Pt has his significant other Linda     Barriers   Psychosocial barriers to participate in program There are no identifiable barriers or psychosocial needs.      Screening Interventions   Interventions Encouraged to exercise             Quality of Life Scores:  Quality of Life - 06/26/22 1329       Quality of Life   Select Quality of Life      Quality of Life Scores   Health/Function Pre 24.3 %    Socioeconomic Pre 29.14 %    Psych/Spiritual Pre 27.43 %    Family Pre 28.8 %    GLOBAL Pre 26.6 %            Scores of 19 and below usually indicate a poorer quality of life in these areas.  A difference of  2-3 points is a clinically meaningful  difference.  A difference of 2-3 points in the total score of the Quality of Life Index has been associated with significant improvement in overall quality of life, self-image, physical symptoms, and general health in studies assessing change in quality of life.  PHQ-9: Review Flowsheet       06/26/2022 04/02/2018  Depression screen PHQ 2/9  Decreased Interest 0 0  Down, Depressed, Hopeless 0 0  PHQ - 2 Score 0 0  Altered sleeping 2 -  Tired, decreased energy 2 -  Change in appetite 0 -  Feeling bad or failure about yourself  0 -  Trouble concentrating 0 -  Moving slowly or fidgety/restless 1 -  Suicidal thoughts 0 -  PHQ-9 Score 5 -  Difficult doing work/chores Somewhat difficult -   Interpretation of Total Score  Total Score Depression Severity:  1-4 = Minimal depression, 5-9 = Mild depression, 10-14 = Moderate depression, 15-19 = Moderately severe depression, 20-27 = Severe depression   Psychosocial Evaluation and Intervention:   Psychosocial Re-Evaluation:  Psychosocial Re-Evaluation     Row Name 07/29/22 1546             Psychosocial Re-Evaluation   Current issues with None Identified       Interventions Encouraged to attend Cardiac Rehabilitation for the exercise       Continue Psychosocial Services  No Follow up required                Psychosocial Discharge (Final Psychosocial Re-Evaluation):  Psychosocial Re-Evaluation - 07/29/22 1546       Psychosocial Re-Evaluation   Current issues with None Identified    Interventions Encouraged to attend Cardiac Rehabilitation for the exercise    Continue Psychosocial Services  No Follow up required             Vocational  Rehabilitation: Provide vocational rehab assistance to qualifying candidates.   Vocational Rehab Evaluation & Intervention:  Vocational Rehab - 06/26/22 1314       Initial Vocational Rehab Evaluation & Intervention   Assessment shows need for Vocational Rehabilitation No   Pt denies any  vocational needs            Education: Education Goals: Education classes will be provided on a weekly basis, covering required topics. Participant will state understanding/return demonstration of topics presented.    Education     Row Name 07/04/22 1400     Education   Cardiac Education Topics P[facility]ikin   Psychologist, forensic Exercise Education   Exercise Education Improving Performance   Instruction Review Code 1- Verbalizes Understanding   Class Start Time 1400   Class Stop Time 1432   Class Time Calculation (min) 32 min    Row Name 07/07/22 1600     Education   Cardiac Education Topics [facility]ritikin   Glass blower/designer Nutrition   Nutrition Workshop Fueling a Forensic psychologist   Instruction Review Code 1- Tax inspector   Class Start Time 1400   Class Stop Time 1453   Class Time Calculation (min) 53 min    Row Name 07/09/22 1500     Education   Cardiac Education Topics Pritikin   Customer service manager   Weekly Topic Simple Sides and Sauces   Instruction Review Code 1- Verbalizes Understanding   Class Start Time 1350   Class Stop Time 1425   Class Time Calculation (min) 35 min    Row Name 07/11/22 1600     Education   Cardiac Education Topics Pritikin   Nurse, children's Exercise Physiologist   Select Psychosocial   Psychosocial How Our Thoughts Can Heal Our Hearts   Instruction Review Code 1- Verbalizes Understanding   Class Start Time 1400   Class Stop Time 1445   Class Time Calculation (min) 45 min    Row Name 07/14/22 1600     Education   Cardiac Education Topics [employer]ritikin   Geographical information systems officer Psychosocial   Psychosocial Workshop From Head to Heart: The Power of a Healthy Outlook   Instruction Review Code 1-  Verbalizes Understanding   Class Start Time 1405   Class Stop Time 1503   Class Time Calculation (min) 58 min    Row Name 07/16/22 1500     Education   Cardiac Education Topics Pritikin   Orthoptist   Educator Dietitian   Weekly Topic Powerhouse Plant-Based Proteins   Instruction Review Code 1- Verbalizes Understanding   Class Start Time 1355   Class Stop Time 1436   Class Time Calculation (min) 41 min    Row Name 07/18/22 1400     Education   Cardiac Education Topics Pritikin   Hospital doctor Education   General Education Hypertension and Heart Disease   Instruction Review Code 1- Verbalizes Understanding   Class Start Time 1400   Class Stop Time 1442   Class Time Calculation (min) 42 min  Row Name 07/23/22 1600     Education   Cardiac Education Topics Pritikin   Orthoptist   Educator Dietitian   Weekly Topic Adding Flavor - Sodium-Free   Instruction Review Code 1- Verbalizes Understanding   Class Start Time 1400   Class Stop Time 1445   Class Time Calculation (min) 45 min    Row Name 07/25/22 1600     Education   Cardiac Education Topics Pritikin   Engineer, mining Education   General Education Heart Disease Risk Reduction   Instruction Review Code 1- Verbalizes Understanding   Class Start Time 1405   Class Stop Time 1450   Class Time Calculation (min) 45 min    Row Name 07/28/22 1600     Education   Cardiac Education Topics Pritikin   Select Workshops     Workshops   Educator Exercise Physiologist   Select Exercise   Exercise Workshop Location manager and Fall Prevention   Instruction Review Code 1- Verbalizes Understanding   Class Start Time 1405   Class Stop Time 1445   Class Time Calculation (min) 40 min            Core Videos: Exercise    Move  It!  Clinical staff conducted group or individual video education with verbal and written material and guidebook.  Patient learns the recommended Pritikin exercise program. Exercise with the goal of living a long, healthy life. Some of the health benefits of exercise include controlled diabetes, healthier blood pressure levels, improved cholesterol levels, improved heart and lung capacity, improved sleep, and better body composition. Everyone should speak with their doctor before starting or changing an exercise routine.  Biomechanical Limitations Clinical staff conducted group or individual video education with verbal and written material and guidebook.  Patient learns how biomechanical limitations can impact exercise and how we can mitigate and possibly overcome limitations to have an impactful and balanced exercise routine.  Body Composition Clinical staff conducted group or individual video education with verbal and written material and guidebook.  Patient learns that body composition (ratio of muscle mass to fat mass) is a key component to assessing overall fitness, rather than body weight alone. Increased fat mass, especially visceral belly fat, can put [place] at increased risk for metabolic syndrome, type 2 diabetes, heart disease, and even death. It is recommended to combine diet and exercise (cardiovascular and resistance training) to improve your body composition. Seek guidance from your physician and exercise physiologist before implementing an exercise routine.  Exercise Action Plan Clinical staff conducted group or individual video education with verbal and written material and guidebook.  Patient learns the recommended strategies to achieve and enjoy long-term exercise adherence, including variety, self-motivation, self-efficacy, and positive decision making. Benefits of exercise include fitness, good health, weight management, more energy, better sleep, less stress, and overall  well-being.  Medical   Heart Disease Risk Reduction Clinical staff conducted group or individual video education with verbal and written material and guidebook.  Patient learns our heart is our most vital organ as it circulates oxygen, nutrients, white blood cells, and hormones throughout the entire body, and carries waste away. Data supports a plant-based eating plan like the Pritikin Program for its effectiveness in slowing progression of and reversing heart disease. The video provides a number of recommendations to address heart disease.   Metabolic Syndrome and Belly Fat  Clinical staff conducted group or individual video education with verbal and written material and guidebook.  Patient learns what metabolic syndrome is, how it leads to heart disease, and how one can reverse it and keep it from coming back. You have metabolic syndrome if you have 3 of the following 5 criteria: abdominal obesity, high blood pressure, high triglycerides, low HDL cholesterol, and high blood sugar.  Hypertension and Heart Disease Clinical staff conducted group or individual video education with verbal and written material and guidebook.  Patient learns that high blood pressure, or hypertension, is very common in the [place]. Hypertension is largely due to excessive salt intake, but other important risk factors include being overweight, physical inactivity, drinking too much alcohol, smoking, and not eating enough potassium from fruits and vegetables. High blood pressure is a leading risk factor for heart attack, stroke, congestive heart failure, dementia, kidney failure, and premature death. Long-term effects of excessive salt intake include stiffening of the arteries and thickening of heart muscle and organ damage. Recommendations include ways to reduce hypertension and the risk of heart disease.  Diseases of Our Time - Focusing on Diabetes Clinical staff conducted group or individual video education with  verbal and written material and guidebook.  Patient learns why the best way to stop diseases of our time is prevention, through food and other lifestyle changes. Medicine (such as prescription pills and surgeries) is often only a Band-Aid on the problem, not a long-term solution. Most common diseases of our time include obesity, type 2 diabetes, hypertension, heart disease, and cancer. The Pritikin Program is recommended and has been proven to help reduce, reverse, and/or prevent the damaging effects of metabolic syndrome.  Nutrition   Overview of the Pritikin Eating Plan  Clinical staff conducted group or individual video education with verbal and written material and guidebook.  Patient learns about the Pritikin Eating Plan for disease risk reduction. The Pritikin Eating Plan emphasizes a wide variety of unrefined, minimally-processed carbohydrates, like fruits, vegetables, whole grains, and legumes. Go, Caution, and Stop food choices are explained. Plant-based and lean animal proteins are emphasized. Rationale provided for low sodium intake for blood pressure control, low added sugars for blood sugar stabilization, and low added fats and oils for coronary artery disease risk reduction and weight management.  Calorie Density  Clinical staff conducted group or individual video education with verbal and written material and guidebook.  Patient learns about calorie density and how it impacts the Pritikin Eating Plan. Knowing the characteristics of the food you choose will help you decide whether those foods will lead to weight gain or weight loss, and whether you want to consume more or less of them. Weight loss is usually a side effect of the Pritikin Eating Plan because of its focus on low calorie-dense foods.  Label Reading  Clinical staff conducted group or individual video education with verbal and written material and guidebook.  Patient learns about the Pritikin recommended label reading  guidelines and corresponding recommendations regarding calorie density, added sugars, sodium content, and whole grains.  Dining Out - Part 1  Clinical staff conducted group or individual video education with verbal and written material and guidebook.  Patient learns that restaurant meals can be sabotaging because they can be so high in calories, fat, sodium, and/or sugar. Patient learns recommended strategies on how to positively address this and avoid unhealthy pitfalls.  Facts on Fats  Clinical staff conducted group or individual video education with verbal and written material and guidebook.  Patient learns that lifestyle modifications can be just as effective, if not more so, as many medications for lowering your risk of heart disease. A Pritikin lifestyle can help to reduce your risk of inflammation and atherosclerosis (cholesterol build-up, or plaque, in the artery walls). Lifestyle interventions such as dietary choices and physical activity address the cause of atherosclerosis. A review of the types of fats and their impact on blood cholesterol levels, along with dietary recommendations to reduce fat intake is also included.  Nutrition Action Plan  Clinical staff conducted group or individual video education with verbal and written material and guidebook.  Patient learns how to incorporate Pritikin recommendations into their lifestyle. Recommendations include planning and keeping personal health goals in mind as an important part of their success.  Healthy Mind-Set    Healthy Minds, Bodies, Hearts  Clinical staff conducted group or individual video education with verbal and written material and guidebook.  Patient learns how to identify when they are stressed. Video will discuss the impact of that stress, as well as the many benefits of stress management. Patient will also be introduced to stress management techniques. The way we think, act, and feel has an impact on our hearts.  How Our  Thoughts Can Heal Our Hearts  Clinical staff conducted group or individual video education with verbal and written material and guidebook.  Patient learns that negative thoughts can cause depression and anxiety. This can result in negative lifestyle behavior and serious health problems. Cognitive behavioral therapy is an effective method to help control our thoughts in order to change and improve our emotional outlook.  Additional Videos:  Exercise    Improving Performance  Clinical staff conducted group or individual video education with verbal and written material and guidebook.  Patient learns to use a non-linear approach by alternating intensity levels and lengths of time spent exercising to help burn more calories and lose more body fat. Cardiovascular exercise helps improve heart health, metabolism, hormonal balance, blood sugar control, and recovery from fatigue. Resistance training improves strength, endurance, balance, coordination, reaction time, metabolism, and muscle mass. Flexibility exercise improves circulation, posture, and balance. Seek guidance from your physician and exercise physiologist before implementing an exercise routine and learn your capabilities and proper form for all exercise.  Introduction to Yoga  Clinical staff conducted group or individual video education with verbal and written material and guidebook.  Patient learns about yoga, a discipline of the coming together of mind, breath, and body. The benefits of yoga include improved flexibility, improved range of motion, better posture and core strength, increased lung function, weight loss, and positive self-image. Yoga's heart health benefits include lowered blood pressure, healthier heart rate, decreased cholesterol and triglyceride levels, improved immune function, and reduced stress. Seek guidance from your physician and exercise physiologist before implementing an exercise routine and learn your capabilities and  proper form for all exercise.  Medical   Aging: Enhancing Your Quality of Life  Clinical staff conducted group or individual video education with verbal and written material and guidebook.  Patient learns key strategies and recommendations to stay in good physical health and enhance quality of life, such as prevention strategies, having an advocate, securing a Health Care Proxy and Power of Attorney, and keeping a list of medications and system for tracking them. It also discusses how to avoid risk for bone loss.  Biology of Weight Control  Clinical staff conducted group or individual video education with verbal and written material and guidebook.  Patient learns that  weight gain occurs because we consume more calories than we burn (eating more, moving less). Even if your body weight is normal, you may have higher ratios of fat compared to muscle mass. Too much body fat puts you at increased risk for cardiovascular disease, heart attack, stroke, type 2 diabetes, and obesity-related cancers. In addition to exercise, following the Pritikin Eating Plan can help reduce your risk.  Decoding Lab Results  Clinical staff conducted group or individual video education with verbal and written material and guidebook.  Patient learns that lab test reflects one measurement whose values change over time and are influenced by many factors, including medication, stress, sleep, exercise, food, hydration, pre-existing medical conditions, and more. It is recommended to use the knowledge from this video to become more involved with your lab results and evaluate your numbers to speak with your doctor.   Diseases of Our Time - Overview  Clinical staff conducted group or individual video education with verbal and written material and guidebook.  Patient learns that according to the CDC, 50% to 70% of chronic diseases (such as obesity, type 2 diabetes, elevated lipids, hypertension, and heart disease) are avoidable through  lifestyle improvements including healthier food choices, listening to satiety cues, and increased physical activity.  Sleep Disorders Clinical staff conducted group or individual video education with verbal and written material and guidebook.  Patient learns how good quality and duration of sleep are important to overall health and well-being. Patient also learns about sleep disorders and how they impact health along with recommendations to address them, including discussing with a physician.  Nutrition  Dining Out - Part 2 Clinical staff conducted group or individual video education with verbal and written material and guidebook.  Patient learns how to plan ahead and communicate in order to maximize their dining experience in a healthy and nutritious manner. Included are recommended food choices based on the type of restaurant the patient is visiting.   Fueling a Banker conducted group or individual video education with verbal and written material and guidebook.  There is a strong connection between our food choices and our health. Diseases like obesity and type 2 diabetes are very prevalent and are in large-part due to lifestyle choices. The Pritikin Eating Plan provides plenty of food and hunger-curbing satisfaction. It is easy to follow, affordable, and helps reduce health risks.  Menu Workshop  Clinical staff conducted group or individual video education with verbal and written material and guidebook.  Patient learns that restaurant meals can sabotage health goals because they are often packed with calories, fat, sodium, and sugar. Recommendations include strategies to plan ahead and to communicate with the manager, chef, or server to help order a healthier meal.  Planning Your Eating Strategy  Clinical staff conducted group or individual video education with verbal and written material and guidebook.  Patient learns about the Pritikin Eating Plan and its benefit of  reducing the risk of disease. The Pritikin Eating Plan does not focus on calories. Instead, it emphasizes high-quality, nutrient-rich foods. By knowing the characteristics of the foods, we choose, we can determine their calorie density and make informed decisions.  Targeting Your Nutrition Priorities  Clinical staff conducted group or individual video education with verbal and written material and guidebook.  Patient learns that lifestyle habits have a tremendous impact on disease risk and progression. This video provides eating and physical activity recommendations based on your personal health goals, such as reducing LDL cholesterol, losing weight, preventing or  controlling type 2 diabetes, and reducing high blood pressure.  Vitamins and Minerals  Clinical staff conducted group or individual video education with verbal and written material and guidebook.  Patient learns different ways to obtain key vitamins and minerals, including through a recommended healthy diet. It is important to discuss all supplements you take with your doctor.   Healthy Mind-Set    Smoking Cessation  Clinical staff conducted group or individual video education with verbal and written material and guidebook.  Patient learns that cigarette smoking and tobacco addiction pose a serious health risk which affects millions of people. Stopping smoking will significantly reduce the risk of heart disease, lung disease, and many forms of cancer. Recommended strategies for quitting are covered, including working with your doctor to develop a successful plan.  Culinary   Becoming a Set designer conducted group or individual video education with verbal and written material and guidebook.  Patient learns that cooking at home can be healthy, cost-effective, quick, and puts them in control. Keys to cooking healthy recipes will include looking at your recipe, assessing your equipment needs, planning ahead, making it  simple, choosing cost-effective seasonal ingredients, and limiting the use of added fats, salts, and sugars.  Cooking - Breakfast and Snacks  Clinical staff conducted group or individual video education with verbal and written material and guidebook.  Patient learns how important breakfast is to satiety and nutrition through the entire day. Recommendations include key foods to eat during breakfast to help stabilize blood sugar levels and to prevent overeating at meals later in the day. Planning ahead is also a key component.  Cooking - Educational psychologist conducted group or individual video education with verbal and written material and guidebook.  Patient learns eating strategies to improve overall health, including an approach to cook more at home. Recommendations include thinking of animal protein as a side on your plate rather than center stage and focusing instead on lower calorie dense options like vegetables, fruits, whole grains, and plant-based proteins, such as beans. Making sauces in large quantities to freeze for later and leaving the skin on your vegetables are also recommended to maximize your experience.  Cooking - Healthy Salads and Dressing Clinical staff conducted group or individual video education with verbal and written material and guidebook.  Patient learns that vegetables, fruits, whole grains, and legumes are the foundations of the Pritikin Eating Plan. Recommendations include how to incorporate each of these in flavorful and healthy salads, and how to create homemade salad dressings. Proper handling of ingredients is also covered. Cooking - Soups and [employer] - Soups and Desserts Clinical staff conducted group or individual video education with verbal and written material and guidebook.  Patient learns that Pritikin soups and desserts make for easy, nutritious, and delicious snacks and meal components that are low in sodium, fat, sugar, and calorie  density, while high in vitamins, minerals, and filling fiber. Recommendations include simple and healthy ideas for soups and desserts.   Overview     The Pritikin Solution Program Overview Clinical staff conducted group or individual video education with verbal and written material and guidebook.  Patient learns that the results of the Pritikin Program have been documented in more than 100 articles published in peer-reviewed journals, and the benefits include reducing risk factors for (and, in some cases, even reversing) high cholesterol, high blood pressure, type 2 diabetes, obesity, and more! An overview of the three key pillars of the Pritikin  Program will be covered: eating well, doing regular exercise, and having a healthy mind-set.  WORKSHOPS  Exercise: Exercise Basics: Building Your Action Plan Clinical staff led group instruction and group discussion with PowerPoint presentation and patient guidebook. To enhance the learning environment the use of posters, models and videos may be added. At the conclusion of this workshop, patients will comprehend the difference between physical activity and exercise, as well as the benefits of incorporating both, into their routine. Patients will understand the FITT (Frequency, Intensity, Time, and Type) principle and how to use it to build an exercise action plan. In addition, safety concerns and other considerations for exercise and cardiac rehab will be addressed by the presenter. The purpose of this lesson is to promote a comprehensive and effective weekly exercise routine in order to improve patients' overall level of fitness.   Managing Heart Disease: Your Path to a Healthier Heart Clinical staff led group instruction and group discussion with PowerPoint presentation and patient guidebook. To enhance the learning environment the use of posters, models and videos may be added.At the conclusion of this workshop, patients will understand the  anatomy and physiology of the heart. Additionally, they will understand how Pritikin's three pillars impact the risk factors, the progression, and the management of heart disease.  The purpose of this lesson is to provide a high-level overview of the heart, heart disease, and how the Pritikin lifestyle positively impacts risk factors.  Exercise Biomechanics Clinical staff led group instruction and group discussion with PowerPoint presentation and patient guidebook. To enhance the learning environment the use of posters, models and videos may be added. Patients will learn how the structural parts of their bodies function and how these functions impact their daily activities, movement, and exercise. Patients will learn how to promote a neutral spine, learn how to manage pain, and identify ways to improve their physical movement in order to promote healthy living. The purpose of this lesson is to expose patients to common physical limitations that impact physical activity. Participants will learn practical ways to adapt and manage aches and pains, and to minimize their effect on regular exercise. Patients will learn how to maintain good posture while sitting, walking, and lifting.  Balance Training and Fall Prevention  Clinical staff led group instruction and group discussion with PowerPoint presentation and patient guidebook. To enhance the learning environment the use of posters, models and videos may be added. At the conclusion of this workshop, patients will understand the importance of their sensorimotor skills (vision, proprioception, and the vestibular system) in maintaining their ability to balance as they age. Patients will apply a variety of balancing exercises that are appropriate for their current level of function. Patients will understand the common causes for poor balance, possible solutions to these problems, and ways to modify their physical environment in order to minimize their  fall risk. The purpose of this lesson is to teach patients about the importance of maintaining balance as they age and ways to minimize their risk of falling.  WORKSHOPS   Nutrition:  Fueling a Ship broker led group instruction and group discussion with PowerPoint presentation and patient guidebook. To enhance the learning environment the use of posters, models and videos may be added. Patients will review the foundational principles of the Pritikin Eating Plan and understand what constitutes a serving size in each of the food groups. Patients will also learn Pritikin-friendly foods that are better choices when away from home and review make-ahead meal and snack options.  Calorie density will be reviewed and applied to three nutrition priorities: weight maintenance, weight loss, and weight gain. The purpose of this lesson is to reinforce (in a group setting) the key concepts around what patients are recommended to eat and how to apply these guidelines when away from home by planning and selecting Pritikin-friendly options. Patients will understand how calorie density may be adjusted for different weight management goals.  Mindful Eating  Clinical staff led group instruction and group discussion with PowerPoint presentation and patient guidebook. To enhance the learning environment the use of posters, models and videos may be added. Patients will briefly review the concepts of the Pritikin Eating Plan and the importance of low-calorie dense foods. The concept of mindful eating will be introduced as well as the importance of paying attention to internal hunger signals. Triggers for non-hunger eating and techniques for dealing with triggers will be explored. The purpose of this lesson is to provide patients with the opportunity to review the basic principles of the Pritikin Eating Plan, discuss the value of eating mindfully and how to measure internal cues of hunger and fullness using the  Hunger Scale. Patients will also discuss reasons for non-hunger eating and learn strategies to use for controlling emotional eating.  Targeting Your Nutrition Priorities Clinical staff led group instruction and group discussion with PowerPoint presentation and patient guidebook. To enhance the learning environment the use of posters, models and videos may be added. Patients will learn how to determine their genetic susceptibility to disease by reviewing their family history. Patients will gain insight into the importance of diet as part of an overall healthy lifestyle in mitigating the impact of genetics and other environmental insults. The purpose of this lesson is to provide patients with the opportunity to assess their personal nutrition priorities by looking at their family history, their own health history and current risk factors. Patients will also be able to discuss ways of prioritizing and modifying the Pritikin Eating Plan for their highest risk areas  Menu  Clinical staff led group instruction and group discussion with PowerPoint presentation and patient guidebook. To enhance the learning environment the use of posters, models and videos may be added. Using menus brought in from [employer], or printed from [employer], patients will apply the Pritikin dining out guidelines that were presented in the [employer] video. Patients will also be able to practice these guidelines in a variety of provided scenarios. The purpose of this lesson is to provide patients with the opportunity to practice hands-on learning of the Pritikin Dining Out guidelines with actual menus and practice scenarios.  Label Reading Clinical staff led group instruction and group discussion with PowerPoint presentation and patient guidebook. To enhance the learning environment the use of posters, models and videos may be added. Patients will review and discuss the Pritikin label reading guidelines  presented in [employer]'s Label Reading Educational series video. Using fool labels brought in from local grocery stores and markets, patients will apply the label reading guidelines and determine if the packaged food meet the Pritikin guidelines. The purpose of this lesson is to provide patients with the opportunity to review, discuss, and practice hands-on learning of the Pritikin Label Reading guidelines with actual packaged food labels. Cooking School  Prit[facility]in's [employer] are designed to teach patients ways to prepare quick, simple, and affordable recipes at home. The importance of nutrition's role in chronic disease risk reduction is reflected in its emphasis in the overall Pritikin program. By  learning how to prepare essential core Pritikin Eating Plan recipes, patients will increase control over what they eat; be able to customize the flavor of foods without the use of added salt, sugar, or fat; and improve the quality of the food they consume. By learning a set of core recipes which are easily assembled, quickly prepared, and affordable, patients are more likely to prepare more healthy foods at home. These workshops focus on convenient breakfasts, simple entres, side dishes, and desserts which can be prepared with minimal effort and are consistent with nutrition recommendations for cardiovascular risk reduction. Cooking [employer] are taught by a Armed forces logistics/support/administrative officer (RD) who has been trained by the [employer]. The chef or RD has a clear understanding of the importance of minimizing - if not completely eliminating - added fat, sugar, and sodium in recipes. Throughout the series of Cooking School Workshop sessions, patients will learn about healthy ingredients and efficient methods of cooking to build confidence in their capability to prepare    Cooking School weekly topics:  Adding Flavor- Sodium-Free  Fast and Healthy Breakfasts  Powerhouse Plant-Based  Proteins  Satisfying Salads and Dressings  Simple Sides and Sauces  International Cuisine-Spotlight on the [employer] Zones  Delicious Desserts  Savory Soups  [employer] - Meals in a Astronomer Appetizers and Snacks  Comforting Weekend Breakfasts  One-Pot Wonders   Fast Evening Meals  Landscape architect Your Pritikin Plate  WORKSHOPS   Healthy Mindset (Psychosocial):  Focused Goals, Sustainable Changes Clinical staff led group instruction and group discussion with PowerPoint presentation and patient guidebook. To enhance the learning environment the use of posters, models and videos may be added. Patients will be able to apply effective goal setting strategies to establish at least one personal goal, and then take consistent, meaningful action toward that goal. They will learn to identify common barriers to achieving personal goals and develop strategies to overcome them. Patients will also gain an understanding of how our mind-set can impact our ability to achieve goals and the importance of cultivating a positive and growth-oriented mind-set. The purpose of this lesson is to provide patients with a deeper understanding of how to set and achieve personal goals, as well as the tools and strategies needed to overcome common obstacles which may arise along the way.  From Head to Heart: The Power of a Healthy Outlook  Clinical staff led group instruction and group discussion with PowerPoint presentation and patient guidebook. To enhance the learning environment the use of posters, models and videos may be added. Patients will be able to recognize and describe the impact of emotions and mood on physical health. They will discover the importance of self-care and explore self-care practices which may work for them. Patients will also learn how to utilize the 4 C's to cultivate a healthier outlook and better manage stress and challenges. The purpose of this lesson is to demonstrate  to patients how a healthy outlook is an essential part of maintaining good health, especially as they continue their cardiac rehab journey.  Healthy Sleep for a Healthy Heart Clinical staff led group instruction and group discussion with PowerPoint presentation and patient guidebook. To enhance the learning environment the use of posters, models and videos may be added. At the conclusion of this workshop, patients will be able to demonstrate knowledge of the importance of sleep to overall health, well-being, and quality of life. They will understand the symptoms of, and treatments for, common sleep  disorders. Patients will also be able to identify daytime and nighttime behaviors which impact sleep, and they will be able to apply these tools to help manage sleep-related challenges. The purpose of this lesson is to provide patients with a general overview of sleep and outline the importance of quality sleep. Patients will learn about a few of the most common sleep disorders. Patients will also be introduced to the concept of "sleep hygiene," and discover ways to self-manage certain sleeping problems through simple daily behavior changes. Finally, the workshop will motivate patients by clarifying the links between quality sleep and their goals of heart-healthy living.   Recognizing and Reducing Stress Clinical staff led group instruction and group discussion with PowerPoint presentation and patient guidebook. To enhance the learning environment the use of posters, models and videos may be added. At the conclusion of this workshop, patients will be able to understand the types of stress reactions, differentiate between acute and chronic stress, and recognize the impact that chronic stress has on their health. They will also be able to apply different coping mechanisms, such as reframing negative self-talk. Patients will have the opportunity to practice a variety of stress management techniques, such as deep  abdominal breathing, progressive muscle relaxation, and/or guided imagery.  The purpose of this lesson is to educate patients on the role of stress in their lives and to provide healthy techniques for coping with it.  Learning Barriers/Preferences:  Learning Barriers/Preferences - 06/26/22 1314       Learning Barriers/Preferences   Learning Barriers Sight    Learning Preferences Skilled Demonstration;Verbal Instruction;Written Material;Audio;Computer/Internet;Group Instruction;Individual Instruction;Pictoral;Video             Education Topics:  Knowledge Questionnaire Score:  Knowledge Questionnaire Score - 06/26/22 1330       Knowledge Questionnaire Score   Pre Score 19/24             Core Components/Risk Factors/Patient Goals at Admission:  Personal Goals and Risk Factors at Admission - 06/26/22 1315       Core Components/Risk Factors/Patient Goals on Admission   Hypertension Yes    Intervention Monitor prescription use compliance.;Provide education on lifestyle modifcations including regular physical activity/exercise, weight management, moderate sodium restriction and increased consumption of fresh fruit, vegetables, and low fat dairy, alcohol moderation, and smoking cessation.    Expected Outcomes Short Term: Continued assessment and intervention until BP is < 140/37mm HG in hypertensive participants. < 130/34mm HG in hypertensive participants with diabetes, heart failure or chronic kidney disease.;Long Term: Maintenance of blood pressure at goal levels.    Lipids Yes    Intervention Provide education and support for participant on nutrition & aerobic/resistive exercise along with prescribed medications to achieve LDL 70mg , HDL >40mg .    Expected Outcomes Short Term: Participant states understanding of desired cholesterol values and is compliant with medications prescribed. Participant is following exercise prescription and nutrition guidelines.;Long Term: Cholesterol  controlled with medications as prescribed, with individualized exercise RX and with personalized nutrition plan. Value goals: LDL < 70mg , HDL > 40 mg.             Core Components/Risk Factors/Patient Goals Review:   Goals and Risk Factor Review     Row Name 07/29/22 1546             Core Components/Risk Factors/Patient Goals Review   Personal Goals Review Weight Management/Obesity;Hypertension;Lipids       Review [name] is doing well with exercise at intensive cardiac rehab. Vital signs have been stable  Expected Outcomes [name] will continue to participate in intensive cardiac rehab for exercise, nutrition and lifestyle modifciations                Core Components/Risk Factors/Patient Goals at Discharge (Final Review):   Goals and Risk Factor Review - 07/29/22 1546       Core Components/Risk Factors/Patient Goals Review   Personal Goals Review Weight Management/Obesity;Hypertension;Lipids    Review [name] is doing well with exercise at intensive cardiac rehab. Vital signs have been stable    Expected Outcomes [name] will continue to participate in intensive cardiac rehab for exercise, nutrition and lifestyle modifciations             ITP Comments:  ITP Comments     Row Name 06/26/22 1100 07/29/22 1545         ITP Comments Dr. Armanda Magic medical director. Introduction to pritikin education program/intensive cardiac rehab. Initial orientation packet reviewed with patient. 30 Day ITP Review. Woodley has good attendance and participation in intensive cardiac rehab               Comments: See ITP comments.

## 2022-07-30 ENCOUNTER — Encounter (HOSPITAL_COMMUNITY)
Admission: RE | Admit: 2022-07-30 | Discharge: 2022-07-30 | Disposition: A | Discharge status: Home / Self Care | Payer: 59 | Source: Ambulatory Visit | Attending: Cardiovascular Disease | Admitting: Cardiovascular Disease

## 2022-07-30 DIAGNOSIS — Z951 Presence of aortocoronary bypass graft: Secondary | ICD-10-CM | POA: Diagnosis not present

## 2022-08-01 ENCOUNTER — Encounter (HOSPITAL_COMMUNITY)
Admission: RE | Admit: 2022-08-01 | Discharge: 2022-08-01 | Disposition: A | Discharge status: Home / Self Care | Payer: 59 | Source: Ambulatory Visit | Attending: Cardiovascular Disease | Admitting: Cardiovascular Disease

## 2022-08-01 DIAGNOSIS — I214 Non-ST elevation (NSTEMI) myocardial infarction: Secondary | ICD-10-CM

## 2022-08-01 DIAGNOSIS — Z951 Presence of aortocoronary bypass graft: Secondary | ICD-10-CM | POA: Diagnosis not present

## 2022-08-04 ENCOUNTER — Encounter (HOSPITAL_COMMUNITY): Payer: 59

## 2022-08-06 ENCOUNTER — Telehealth (HOSPITAL_COMMUNITY): Payer: Self-pay | Admitting: *Deleted

## 2022-08-06 ENCOUNTER — Encounter (HOSPITAL_COMMUNITY): Payer: 59

## 2022-08-06 NOTE — Telephone Encounter (Signed)
Left message to call cardiac rehab.Ginnette Gates [place] RN BSN  

## 2022-08-08 ENCOUNTER — Encounter (HOSPITAL_COMMUNITY)
Admission: RE | Admit: 2022-08-08 | Discharge: 2022-08-08 | Disposition: A | Discharge status: Home / Self Care | Payer: 59 | Source: Ambulatory Visit | Attending: Cardiovascular Disease | Admitting: Cardiovascular Disease

## 2022-08-08 DIAGNOSIS — Z951 Presence of aortocoronary bypass graft: Secondary | ICD-10-CM | POA: Diagnosis not present

## 2022-08-08 DIAGNOSIS — I214 Non-ST elevation (NSTEMI) myocardial infarction: Secondary | ICD-10-CM

## 2022-08-11 ENCOUNTER — Other Ambulatory Visit: Payer: Self-pay | Admitting: Surgical

## 2022-08-11 ENCOUNTER — Other Ambulatory Visit: Payer: Self-pay | Admitting: Cardiovascular Disease

## 2022-08-11 ENCOUNTER — Encounter (HOSPITAL_COMMUNITY)
Admission: RE | Admit: 2022-08-11 | Discharge: 2022-08-11 | Disposition: A | Discharge status: Home / Self Care | Payer: 59 | Source: Ambulatory Visit | Attending: Cardiovascular Disease | Admitting: Cardiovascular Disease

## 2022-08-11 DIAGNOSIS — Z951 Presence of aortocoronary bypass graft: Secondary | ICD-10-CM

## 2022-08-11 DIAGNOSIS — I214 Non-ST elevation (NSTEMI) myocardial infarction: Secondary | ICD-10-CM

## 2022-08-13 ENCOUNTER — Encounter (HOSPITAL_COMMUNITY)
Admission: RE | Admit: 2022-08-13 | Discharge: 2022-08-13 | Disposition: A | Discharge status: Home / Self Care | Payer: 59 | Source: Ambulatory Visit | Attending: Cardiovascular Disease | Admitting: Cardiovascular Disease

## 2022-08-13 DIAGNOSIS — I214 Non-ST elevation (NSTEMI) myocardial infarction: Secondary | ICD-10-CM

## 2022-08-13 DIAGNOSIS — Z951 Presence of aortocoronary bypass graft: Secondary | ICD-10-CM | POA: Diagnosis not present

## 2022-08-15 ENCOUNTER — Encounter (HOSPITAL_COMMUNITY)
Admission: RE | Admit: 2022-08-15 | Discharge: 2022-08-15 | Disposition: A | Discharge status: Home / Self Care | Payer: 59 | Source: Ambulatory Visit | Attending: Cardiovascular Disease | Admitting: Cardiovascular Disease

## 2022-08-15 DIAGNOSIS — Z951 Presence of aortocoronary bypass graft: Secondary | ICD-10-CM

## 2022-08-15 DIAGNOSIS — I214 Non-ST elevation (NSTEMI) myocardial infarction: Secondary | ICD-10-CM

## 2022-08-17 ENCOUNTER — Other Ambulatory Visit: Payer: Self-pay | Admitting: Surgical

## 2022-08-18 ENCOUNTER — Encounter (HOSPITAL_COMMUNITY)
Admission: RE | Admit: 2022-08-18 | Discharge: 2022-08-18 | Disposition: A | Discharge status: Home / Self Care | Payer: 59 | Source: Ambulatory Visit | Attending: Cardiovascular Disease | Admitting: Cardiovascular Disease

## 2022-08-18 DIAGNOSIS — I214 Non-ST elevation (NSTEMI) myocardial infarction: Secondary | ICD-10-CM

## 2022-08-18 DIAGNOSIS — Z951 Presence of aortocoronary bypass graft: Secondary | ICD-10-CM

## 2022-08-20 ENCOUNTER — Encounter (HOSPITAL_COMMUNITY)
Admission: RE | Admit: 2022-08-20 | Discharge: 2022-08-20 | Disposition: A | Discharge status: Home / Self Care | Payer: 59 | Source: Ambulatory Visit | Attending: Cardiovascular Disease | Admitting: Cardiovascular Disease

## 2022-08-20 DIAGNOSIS — Z951 Presence of aortocoronary bypass graft: Secondary | ICD-10-CM | POA: Diagnosis not present

## 2022-08-20 DIAGNOSIS — I214 Non-ST elevation (NSTEMI) myocardial infarction: Secondary | ICD-10-CM

## 2022-08-22 ENCOUNTER — Encounter (HOSPITAL_COMMUNITY)
Admission: RE | Admit: 2022-08-22 | Discharge: 2022-08-22 | Disposition: A | Discharge status: Home / Self Care | Payer: 59 | Source: Ambulatory Visit | Attending: Cardiovascular Disease | Admitting: Cardiovascular Disease

## 2022-08-22 DIAGNOSIS — Z951 Presence of aortocoronary bypass graft: Secondary | ICD-10-CM | POA: Diagnosis not present

## 2022-08-22 DIAGNOSIS — I214 Non-ST elevation (NSTEMI) myocardial infarction: Secondary | ICD-10-CM

## 2022-08-25 ENCOUNTER — Encounter (HOSPITAL_COMMUNITY)
Admission: RE | Admit: 2022-08-25 | Discharge: 2022-08-25 | Disposition: A | Discharge status: Home / Self Care | Payer: 59 | Source: Ambulatory Visit | Attending: Cardiovascular Disease | Admitting: Cardiovascular Disease

## 2022-08-25 DIAGNOSIS — I214 Non-ST elevation (NSTEMI) myocardial infarction: Secondary | ICD-10-CM | POA: Diagnosis present

## 2022-08-25 DIAGNOSIS — Z951 Presence of aortocoronary bypass graft: Secondary | ICD-10-CM | POA: Diagnosis not present

## 2022-08-25 NOTE — Progress Notes (Signed)
[name] is noted to have a small chest tube suture present from on the left below his distal mid  sternal incision which is well healed. Dr Karolee Ohs office called and notified spoke with Pearletha Alfred RN who said that [name] can come by after class for an evaluation. [name] has no complaint's or issues. Will continue to monitor the patient throughout  the program.[name] Mila Palmer RN BSN

## 2022-08-27 ENCOUNTER — Encounter (HOSPITAL_COMMUNITY)
Admission: RE | Admit: 2022-08-27 | Discharge: 2022-08-27 | Disposition: A | Discharge status: Home / Self Care | Payer: 59 | Source: Ambulatory Visit | Attending: Cardiovascular Disease | Admitting: Cardiovascular Disease

## 2022-08-27 DIAGNOSIS — I214 Non-ST elevation (NSTEMI) myocardial infarction: Secondary | ICD-10-CM

## 2022-08-27 DIAGNOSIS — Z951 Presence of aortocoronary bypass graft: Secondary | ICD-10-CM

## 2022-08-27 NOTE — Progress Notes (Signed)
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  Patient's Home Medications on Admission:  Current Outpatient Medications:    acetaminophen (TYLENOL) 500 MG tablet, Take 1,000 mg by mouth as needed for moderate pain., Disp: , Rfl:    Alpha-D-Galactosidase (BEANO PO), 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)., Disp: , Rfl:    Bacillus Coagulans-Inulin (ALIGN PREBIOTIC-PROBIOTIC PO), Take by mouth daily at 6 (six) AM., Disp: , Rfl:    carvedilol (COREG) 3.125 MG tablet, TAKE 1 TABLET BY MOUTH TWICE A DAY WITH FOOD, Disp: 180 tablet, Rfl: 0   clopidogrel (PLAVIX) 75 MG tablet, Take 1 tablet (75 mg total) by mouth daily., Disp: 30 tablet, Rfl: 3   ezetimibe (ZETIA) 10 MG tablet, TAKE 1 TABLET BY MOUTH EVERY DAY, Disp: 90 tablet, Rfl: 3   Fe Fum-Vit C-Vit B12-FA (TRIGELS-F FORTE) CAPS capsule, Take 1 capsule by mouth daily after breakfast., Disp: 30 capsule, Rfl: 2   LORazepam (ATIVAN) 0.5 MG tablet, Take 0.5 mg by mouth as needed for anxiety., Disp: , Rfl:    omega-3 acid ethyl esters (LOVAZA) 1 G capsule, Take 1 g by mouth 2 (two) times daily., Disp: , Rfl:    OVER THE COUNTER MEDICATION, Take 1 tablet by mouth in the morning and at bedtime. Beet Root chew, Disp: , Rfl:    pantoprazole (PROTONIX) 40 MG tablet, Take 1 tablet (40 mg total) by mouth daily., Disp: 30 tablet, Rfl: 11   polyethylene glycol (MIRALAX) 17 g packet, Take 17 g by mouth daily., Disp:  , Rfl:    zolpidem (AMBIEN) 10 MG tablet, Take 1 tablet (10 mg total) by mouth at bedtime as needed. For sleep (Patient taking differently: Take 5-10 mg by mouth at bedtime as needed for sleep.), Disp: 30 tablet, Rfl: 5  Past Medical History: Past Medical History:  Diagnosis Date   Anxiety    Arthritis    "wrists" (07/27/2014)   Coronary artery disease    a.  Pt with 4 stents over the course of 2003-2012;  b.  LHC 6/16:  LAD, RCA, LCx stents patent, OM1 50%, EF 50-55% >> med rx    Elevated LFTs    GERD (gastroesophageal reflux disease)    Hypercholesterolemia    Hypertension    Ischemic cardiomyopathy    cMRI 03/2011:  EF 48% with dist ant, septal, apical and inf-apical dyskinesia, no apical clot, dist ant, apical and septal full thickness scar >> No ICD needed  //  Limited echo 7/19: Large apical septal aneurysm, severe hypokinesis of the entire apex and mid to apical segments of the anterior wall, anterolateral wall and anterior septum-consistent with infarct and most of the LAD territory; EF 35    Left ventricular aneurysm    Limited echo 7/19: Large apical septal aneurysm, severe hypokinesis of the entire apex and mid to apical segments of the anterior wall, anterolateral wall and anterior septum-consistent with infarct and most of the LAD territory; EF 35  Myocardial infarction (HCC) 2003    Tobacco Use: Social History   Tobacco Use  Smoking Status Former   Packs/day: 1.00   Years: 15.00   Additional pack years: 0.00   Total pack years: 15.00   Types: Cigarettes  Smokeless Tobacco Never  Tobacco Comments   "quit smoking cigarettes in 1979"    Labs: Review Flowsheet  More data exists      Latest Ref Rng & Units 01/08/2011 06/11/2012 06/16/2019 05/06/2022 05/08/2022  Labs for ITP Cardiac and Pulmonary Rehab  Cholestrol 100 - 199 mg/dL 161  096  045  - -  LDL (calc) 0 - 99 mg/dL 77  73  409  - -  HDL-C >39 mg/dL 30  34  38  - -  Trlycerides 0 - 149 mg/dL 811  914  782  - -   Hemoglobin A1c 4.8 - 5.6 % 6.0  - - 7.0  -  PH, Arterial 7.35 - 7.45 - - - - 7.360  7.336  7.368  7.368  7.387  7.341   PCO2 arterial 32 - 48 mmHg - - - - 37.2  37.4  35.2  37.7  37.8  40.9   Bicarbonate 20.0 - 28.0 mmol/L - - - - 21.0  19.9  20.6  21.7  22.7  21.7  22.1   TCO2 22 - 32 mmol/L - - - - 22  21  22  24  23  26  24  27  23  23  23  23    Acid-base deficit 0.0 - 2.0 mmol/L - - - - 4.0  5.0  5.0  3.0  2.0  4.0  3.0   O2 Saturation % - - - - 99  97  99  100  100  88  100     Capillary Blood Glucose: Lab Results  Component Value Date   GLUCAP 120 (H) 05/12/2022   GLUCAP 102 (H) 05/12/2022   GLUCAP 138 (H) 05/11/2022   GLUCAP 118 (H) 05/11/2022   GLUCAP 112 (H) 05/11/2022     Exercise Target Goals: Exercise Program Goal: Individual exercise prescription set using results from initial 6 min walk test and THRR while considering  patient's activity barriers and safety.   Exercise Prescription Goal: Initial exercise prescription builds to 30-45 minutes a day of aerobic activity, 2-3 days per week.  Home exercise guidelines will be given to patient during program as part of exercise prescription that the participant will acknowledge.  Activity Barriers & Risk Stratification:  Activity Barriers & Cardiac Risk Stratification - 06/26/22 1316       Activity Barriers & Cardiac Risk Stratification   Activity Barriers Deconditioning;Muscular Weakness;Shortness of Breath;Incisional Pain;Other (comment)    Comments Sternal Precautions    Cardiac Risk Stratification High             6 Minute Walk:  6 Minute Walk     Row Name 06/26/22 1213         6 Minute Walk   Phase Initial     Distance 1038 feet     Walk Time 6 minutes     # of Rest Breaks 0     MPH 2     METS 1.93     RPE 11     Perceived Dyspnea  1     VO2 Peak 6.8     Symptoms Yes (comment)     Comments Rt lower leg surgical pain 3/10, SOB RPD = 1  Resting HR 72 bpm     Resting BP 112/70     Resting  Oxygen Saturation  98 %     Exercise Oxygen Saturation  during 6 min walk 98 %     Max Ex. HR 98 bpm     Max Ex. BP 122/78     2 Minute Post BP 118/70              Oxygen Initial Assessment:   Oxygen Re-Evaluation:   Oxygen Discharge (Final Oxygen Re-Evaluation):   Initial Exercise Prescription:  Initial Exercise Prescription - 06/26/22 1300       Date of Initial Exercise RX and Referring Provider   Date 06/26/22    Referring Provider Charlton Haws, MD    Expected Discharge Date 09/05/22      NuStep   Level 1    SPM 75    Minutes 15    METs 1.9      Track   Laps 8    Minutes 15    METs 1.9      Prescription Details   Frequency (times per week) 3x    Duration Progress to 30 minutes of continuous aerobic without signs/symptoms of physical distress      Intensity   THRR 40-80% of Max Heartrate 58-115    Ratings of Perceived Exertion 11-13    Perceived Dyspnea 0-4      Progression   Progression Continue progressive overload as per policy without signs/symptoms or physical distress.      Resistance Training   Training Prescription Yes    Weight 2 lbs    Reps 10-15             Perform Capillary Blood Glucose checks as needed.  Exercise Prescription Changes:   Exercise Prescription Changes     Row Name 07/02/22 1500 07/16/22 1400 07/25/22 1400 08/18/22 1500       Response to Exercise   Blood Pressure (Admit) 130/60 128/62 132/80 122/70    Blood Pressure (Exercise) 136/82 112/58 134/62 158/62    Blood Pressure (Exit) 110/56 122/58 104/64 108/56    Heart Rate (Admit) 108 bpm 70 bpm 77 bpm 84 bpm    Heart Rate (Exercise) 107 bpm 98 bpm 110 bpm 121 bpm    Heart Rate (Exit) 68 bpm 79 bpm 80 bpm 93 bpm    Rating of Perceived Exertion (Exercise) 13 11 11 10     Symptoms None None None None    Comments Pt's first day in the CRP2 program Reviewed METs Reviewed MEts and goals Reviewed MEts and goals    Duration Continue with 30 min of aerobic exercise  without signs/symptoms of physical distress. Continue with 30 min of aerobic exercise without signs/symptoms of physical distress. Continue with 30 min of aerobic exercise without signs/symptoms of physical distress. Continue with 30 min of aerobic exercise without signs/symptoms of physical distress.    Intensity THRR unchanged THRR unchanged THRR unchanged THRR unchanged      Progression   Progression Continue to progress workloads to maintain intensity without signs/symptoms of physical distress. Continue to progress workloads to maintain intensity without signs/symptoms of physical distress. Continue to progress workloads to maintain intensity without signs/symptoms of physical distress. Continue to progress workloads to maintain intensity without signs/symptoms of physical distress.    Average METs 2.55 3.1 3 3.6      Resistance Training   Training Prescription No No Yes Yes    Weight No weights on wednesdays No weights on  wednesdays 3 lbs 4 lbs    Reps -- -- 10-15 10-15    Time -- -- 10 Minutes 10 Minutes      Interval Training   Interval Training No No No No      NuStep   Level 1 1 2 3     SPM 100 98 125 99    Minutes 15 15 15 15     METs 2.4 3.1 2.8 3.8      Track   Laps 13 17 17 19     Minutes 15 15 15 15     METs 2.66 3.17 2.8 3.43             Exercise Comments:   Exercise Comments     Row Name 07/02/22 1526 07/16/22 1444 07/25/22 1442 08/18/22 1500 08/19/22 0924   Exercise Comments Pt's first day in the CRP2 program. Pt tolerated the initial sesssion well with no complaints. Reviewed METs. Pt is making good progress. Reviewed METs and goals. Pt is making good progress. Will consider increasing his workloads next session. Reviewed METs and goals. Pt is making good progress. Will consider increasing his workload on Nustep next week. --            Exercise Goals and Review:   Exercise Goals     Row Name 06/26/22 1253             Exercise Goals   Increase  Physical Activity Yes       Intervention Provide advice, education, support and counseling about physical activity/exercise needs.;Develop an individualized exercise prescription for aerobic and resistive training based on initial evaluation findings, risk stratification, comorbidities and participant's personal goals.       Expected Outcomes Short Term: Attend rehab on a regular basis to increase amount of physical activity.;Long Term: Add in home exercise to make exercise part of routine and to increase amount of physical activity.;Long Term: Exercising regularly at least 3-5 days a week.       Increase Strength and Stamina Yes       Intervention Develop an individualized exercise prescription for aerobic and resistive training based on initial evaluation findings, risk stratification, comorbidities and participant's personal goals.;Provide advice, education, support and counseling about physical activity/exercise needs.       Expected Outcomes Short Term: Increase workloads from initial exercise prescription for resistance, speed, and METs.;Short Term: Perform resistance training exercises routinely during rehab and add in resistance training at home;Long Term: Improve cardiorespiratory fitness, muscular endurance and strength as measured by increased METs and functional capacity ( )       Able to understand and use rate of perceived exertion (RPE) scale Yes       Intervention Provide education and explanation on how to use RPE scale       Expected Outcomes Short Term: Able to use RPE daily in rehab to express subjective intensity level;Long Term:  Able to use RPE to guide intensity level when exercising independently       Knowledge and understanding of Target Heart Rate Range (THRR) Yes       Intervention Provide education and explanation of THRR including how the numbers were predicted and where they are located for reference       Expected Outcomes Short Term: Able to state/look up THRR;Long  Term: Able to use THRR to govern intensity when exercising independently;Short Term: Able to use daily as guideline for intensity in rehab       Understanding of Exercise Prescription Yes  Intervention Provide education, explanation, and written materials on patient's individual exercise prescription       Expected Outcomes Short Term: Able to explain program exercise prescription;Long Term: Able to explain home exercise prescription to exercise independently                Exercise Goals Re-Evaluation :  Exercise Goals Re-Evaluation     Row Name 07/02/22 1524 07/25/22 1441 08/18/22 1500         Exercise Goal Re-Evaluation   Exercise Goals Review Increase Physical Activity;Increase Strength and Stamina;Able to understand and use rate of perceived exertion (RPE) scale;Knowledge and understanding of Target Heart Rate Range (THRR);Understanding of Exercise Prescription Increase Physical Activity;Increase Strength and Stamina;Able to understand and use rate of perceived exertion (RPE) scale;Knowledge and understanding of Target Heart Rate Range (THRR);Understanding of Exercise Prescription Increase Physical Activity;Increase Strength and Stamina;Able to understand and use rate of perceived exertion (RPE) scale;Knowledge and understanding of Target Heart Rate Range (THRR);Understanding of Exercise Prescription     Comments Pt's first day in the CRP2 program. Pt understands the RPE scale, Exercise Rx, and THRR. Reviewed METs and goals. Pt voices he is making progress on his goals of increased strength and stamina. Reviewed METs and goals. Pt voices he continues to progress on his goal of increased strength and stamina.     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 Will continue to montior patients and progress workloads as tolerated. Will continue to montior patients and progress workloads as tolerated.              Discharge Exercise  Prescription (Final Exercise Prescription Changes):  Exercise Prescription Changes - 08/18/22 1500       Response to Exercise   Blood Pressure (Admit) 122/70    Blood Pressure (Exercise) 158/62    Blood Pressure (Exit) 108/56    Heart Rate (Admit) 84 bpm    Heart Rate (Exercise) 121 bpm    Heart Rate (Exit) 93 bpm    Rating of Perceived Exertion (Exercise) 10    Symptoms None    Comments Reviewed MEts and goals    Duration Continue with 30 min of aerobic exercise without signs/symptoms of physical distress.    Intensity THRR unchanged      Progression   Progression Continue to progress workloads to maintain intensity without signs/symptoms of physical distress.    Average METs 3.6      Resistance Training   Training Prescription Yes    Weight 4 lbs    Reps 10-15    Time 10 Minutes      Interval Training   Interval Training No      NuStep   Level 3    SPM 99    Minutes 15    METs 3.8      Track   Laps 19    Minutes 15    METs 3.43             Nutrition:  Target Goals: Understanding of nutrition guidelines, daily intake of sodium 1500mg , cholesterol 200mg , calories 30% from fat and 7% or less from saturated fats, daily to have 5 or more servings of fruits and vegetables.  Biometrics:  Pre Biometrics - 06/26/22 1237       Pre Biometrics   Waist Circumference 42 inches    Hip Circumference 39 inches    Waist to Hip Ratio 1.08 %    Triceps Skinfold 15 mm    Grip Strength  38 kg    Flexibility 0 in   unable to reach   Single Leg Stand 15 seconds              Nutrition Therapy Plan and Nutrition Goals:  Nutrition Therapy & Goals - 08/01/22 1325       Nutrition Therapy   Diet Heart Healthy/Carbohydrate Consistent Diet      Personal Nutrition Goals   Nutrition Goal Patient to identify strategies for reducing cardiovascular risk by attending the [employer] education and nutrition series weekly.    Personal Goal #2 Patient to improve diet quality by  using the plate method as a guide for meal planning to include lean protein/plant protein, fruits, vegetables, whole grains, nonfat dairy as part of a well-balanced diet.    Personal Goal #3 Patient to reduce sodium intake to 1500mg  per day    Comments Goals in action. Patient continues to attend the [facility] education and nutrition series regularly. Patient has started making many dietary changes including increased fish and vegetables. Patient will benefit from participation in intensive cardiac rehab for nutrition, exercise, and lifestyle modification.      Intervention Plan   Intervention Prescribe, educate and counsel regarding individualized specific dietary modifications aiming towards targeted core components such as weight, hypertension, lipid management, diabetes, heart failure and other comorbidities.;Nutrition handout(s) given to patient.    Expected Outcomes Short Term Goal: Understand basic principles of dietary content, such as calories, fat, sodium, cholesterol and nutrients.;Long Term Goal: Adherence to prescribed nutrition plan.             Nutrition Assessments:  Nutrition Assessments - 07/03/22 0847       Rate Your Plate Scores   Pre Score 68            MEDIFICTS Score Key: ?70 Need to make dietary changes  40-70 Heart Healthy Diet ? 40 Therapeutic Level Cholesterol Diet   Flowsheet Row INTENSIVE CARDIAC REHAB from 07/02/2022 in [facility] for Heart, Vascular, & Lung Health  Picture Your Plate Total Score on Admission 68      Picture Your Plate Scores: <16 Unhealthy dietary pattern with much room for improvement. 41-50 Dietary pattern unlikely to meet recommendations for good health and room for improvement. 51-60 More healthful dietary pattern, with some room for improvement.  >60 Healthy dietary pattern, although there may be some specific behaviors that could be improved.    Nutrition Goals Re-Evaluation:  Nutrition Goals  Re-Evaluation     Row Name 07/02/22 1520 08/01/22 1325           Goals   Current Weight 179 lb 7.3 oz (81.4 kg) 181 lb 14.1 oz (82.5 kg)      Comment A1c 7.0, GFR >60, Lipoprotein A WNL, cholesterol 208, triglycerides 276, LDL 115 hepatic panel WNL; other most recent labs  A1c 7.0, GFR >60, Lipoprotein A WNL, cholesterol 208, triglycerides 276, LDL 115      Expected Outcome Patient has started making many dietary changes including increased fish and vegetables. Per documentation from cardiology follow-up on 5/6, will consider starting PCSK-9 inhibitor. Patient will benefit from participation in intensive cardiac rehab for nutrition, exercise, and lifestyle modification. Goals in action. Patient continues to attend the [facility] education and nutrition series regularly. Patient has started making many dietary changes including increased fish and vegetables. Patient will benefit from participation in intensive cardiac rehab for nutrition, exercise, and lifestyle modification.  Nutrition Goals Re-Evaluation:  Nutrition Goals Re-Evaluation     Row Name 07/02/22 1520 08/01/22 1325           Goals   Current Weight 179 lb 7.3 oz (81.4 kg) 181 lb 14.1 oz (82.5 kg)      Comment A1c 7.0, GFR >60, Lipoprotein A WNL, cholesterol 208, triglycerides 276, LDL 115 hepatic panel WNL; other most recent labs  A1c 7.0, GFR >60, Lipoprotein A WNL, cholesterol 208, triglycerides 276, LDL 115      Expected Outcome Patient has started making many dietary changes including increased fish and vegetables. Per documentation from cardiology follow-up on 5/6, will consider starting PCSK-9 inhibitor. Patient will benefit from participation in intensive cardiac rehab for nutrition, exercise, and lifestyle modification. Goals in action. Patient continues to attend the [facility] education and nutrition series regularly. Patient has started making many dietary changes including increased fish and vegetables.  Patient will benefit from participation in intensive cardiac rehab for nutrition, exercise, and lifestyle modification.               Nutrition Goals Discharge (Final Nutrition Goals Re-Evaluation):  Nutrition Goals Re-Evaluation - 08/01/22 1325       Goals   Current Weight 181 lb 14.1 oz (82.5 kg)    Comment hepatic panel WNL; other most recent labs  A1c 7.0, GFR >60, Lipoprotein A WNL, cholesterol 208, triglycerides 276, LDL 115    Expected Outcome Goals in action. Patient continues to attend the [facility] education and nutrition series regularly. Patient has started making many dietary changes including increased fish and vegetables. Patient will benefit from participation in intensive cardiac rehab for nutrition, exercise, and lifestyle modification.             Psychosocial: Target Goals: Acknowledge presence or absence of significant depression and/or stress, maximize coping skills, provide positive support system. Participant is able to verbalize types and ability to use techniques and skills needed for reducing stress and depression.  Initial Review & Psychosocial Screening:  Initial Psych Review & Screening - 06/26/22 1312       Initial Review   Current issues with Current Sleep Concerns      Family Dynamics   Good Support System? Yes   Pt has his significant other Linda     Barriers   Psychosocial barriers to participate in program There are no identifiable barriers or psychosocial needs.      Screening Interventions   Interventions Encouraged to exercise             Quality of Life Scores:  Quality of Life - 06/26/22 1329       Quality of Life   Select Quality of Life      Quality of Life Scores   Health/Function Pre 24.3 %    Socioeconomic Pre 29.14 %    Psych/Spiritual Pre 27.43 %    Family Pre 28.8 %    GLOBAL Pre 26.6 %            Scores of 19 and below usually indicate a poorer quality of life in these areas.  A difference of  2-3  points is a clinically meaningful difference.  A difference of 2-3 points in the total score of the Quality of Life Index has been associated with significant improvement in overall quality of life, self-image, physical symptoms, and general health in studies assessing change in quality of life.  PHQ-9: Review Flowsheet       06/26/2022 04/02/2018  Depression screen  PHQ 2/9  Decreased Interest 0 0  Down, Depressed, Hopeless 0 0  PHQ - 2 Score 0 0  Altered sleeping 2 -  Tired, decreased energy 2 -  Change in appetite 0 -  Feeling bad or failure about yourself  0 -  Trouble concentrating 0 -  Moving slowly or fidgety/restless 1 -  Suicidal thoughts 0 -  PHQ-9 Score 5 -  Difficult doing work/chores Somewhat difficult -   Interpretation of Total Score  Total Score Depression Severity:  1-4 = Minimal depression, 5-9 = Mild depression, 10-14 = Moderate depression, 15-19 = Moderately severe depression, 20-27 = Severe depression   Psychosocial Evaluation and Intervention:   Psychosocial Re-Evaluation:  Psychosocial Re-Evaluation     Row Name 07/29/22 1546             Psychosocial Re-Evaluation   Current issues with None Identified       Interventions Encouraged to attend Cardiac Rehabilitation for the exercise       Continue Psychosocial Services  No Follow up required                Psychosocial Discharge (Final Psychosocial Re-Evaluation):  Psychosocial Re-Evaluation - 07/29/22 1546       Psychosocial Re-Evaluation   Current issues with None Identified    Interventions Encouraged to attend Cardiac Rehabilitation for the exercise    Continue Psychosocial Services  No Follow up required             Vocational Rehabilitation: Provide vocational rehab assistance to qualifying candidates.   Vocational Rehab Evaluation & Intervention:  Vocational Rehab - 06/26/22 1314       Initial Vocational Rehab Evaluation & Intervention   Assessment shows need for Vocational  Rehabilitation No   Pt denies any vocational needs            Education: Education Goals: Education classes will be provided on a weekly basis, covering required topics. Participant will state understanding/return demonstration of topics presented.    Education     Row Name 07/04/22 1400     Education   Cardiac Education Topics Pritikin   Psychologist, forensic Exercise Education   Exercise Education Improving Performance   Instruction Review Code 1- Verbalizes Understanding   Class Start Time 1400   Class Stop Time 1432   Class Time Calculation (min) 32 min    Row Name 07/07/22 1600     Education   Cardiac Education Topics [facility]in   Glass blower/designer Nutrition   Nutrition Workshop Fueling a Forensic psychologist   Instruction Review Code 1- Tax inspector   Class Start Time 1400   Class Stop Time 1453   Class Time Calculation (min) 53 min    Row Name 07/09/22 1500     Education   Cardiac Education Topics [facility]ritikin   Customer service manager   Weekly Topic Simple Sides and Sauces   Instruction Review Code 1- Verbalizes Understanding   Class Start Time 1350   Class Stop Time 1425   Class Time Calculation (min) 35 min    Row Name 07/11/22 1600     Education   Cardiac Education Topics Pritikin   Psychologist, forensic Psychosocial   Psychosocial  How Our Thoughts Can Heal Our Hearts   Instruction Review Code 1- Verbalizes Understanding   Class Start Time 1400   Class Stop Time 1445   Class Time Calculation (min) 45 min    Row Name 07/14/22 1600     Education   Cardiac Education Topics Pritikin   Geographical information systems officer Psychosocial   Psychosocial Workshop From Head to Heart: The Power of a Healthy Outlook    Instruction Review Code 1- Verbalizes Understanding   Class Start Time 1405   Class Stop Time 1503   Class Time Calculation (min) 58 min    Row Name 07/16/22 1500     Education   Cardiac Education Topics Pritikin   Customer service manager   Weekly Topic Powerhouse Plant-Based Proteins   Instruction Review Code 1- Verbalizes Understanding   Class Start Time 1355   Class Stop Time 1436   Class Time Calculation (min) 41 min    Row Name 07/18/22 1400     Education   Cardiac Education Topics Pritikin   Hospital doctor Education   General Education Hypertension and Heart Disease   Instruction Review Code 1- Verbalizes Understanding   Class Start Time 1400   Class Stop Time 1442   Class Time Calculation (min) 42 min    Row Name 07/23/22 1600     Education   Cardiac Education Topics Pritikin   Customer service manager   Weekly Topic Adding Flavor - Sodium-Free   Instruction Review Code 1- Verbalizes Understanding   Class Start Time 1400   Class Stop Time 1445   Class Time Calculation (min) 45 min    Row Name 07/25/22 1600     Education   Cardiac Education Topics Pritikin   Engineer, mining Education   General Education Heart Disease Risk Reduction   Instruction Review Code 1- Verbalizes Understanding   Class Start Time 1405   Class Stop Time 1450   Class Time Calculation (min) 45 min    Row Name 07/28/22 1600     Education   Cardiac Education Topics Pritikin   Select Workshops     Workshops   Educator Exercise Physiologist   Select Exercise   Exercise Workshop Location manager and Fall Prevention   Instruction Review Code 1- Verbalizes Understanding   Class Start Time 1405   Class Stop Time 1445   Class Time Calculation (min) 40 min    Row Name  07/30/22 1500     Education   Cardiac Education Topics Pritikin   Customer service manager   Weekly Topic Fast and Healthy Breakfasts   Instruction Review Code 1- Verbalizes Understanding   Class Start Time 1400   Class Stop Time 1440   Class Time Calculation (min) 40 min    Row Name 08/01/22 1500     Education   Cardiac Education Topics Pritikin   Licensed conveyancer Nutrition   Nutrition Overview of the Pritikin Eating Plan   Instruction Review Code 1- Verbalizes Understanding   Class  Start Time 1400   Class Stop Time 1450   Class Time Calculation (min) 50 min    Row Name 08/08/22 1500     Education   Cardiac Education Topics Pritikin   Nurse, children's Exercise Physiologist   Select Psychosocial   Psychosocial Healthy Minds, Bodies, Hearts   Instruction Review Code 1- Verbalizes Understanding   Class Start Time 1357   Class Stop Time 1429   Class Time Calculation (min) 32 min    Row Name 08/11/22 1500     Education   Cardiac Education Topics Pritikin   Musician   Nutrition Workshop Label Reading   Instruction Review Code 1- Verbalizes Understanding   Class Start Time 1400   Class Stop Time 1445   Class Time Calculation (min) 45 min    Row Name 08/13/22 1600     Education   Cardiac Education Topics Pritikin   Customer service manager   Weekly Topic [employer] Desserts   Instruction Review Code 1- Verbalizes Understanding   Class Start Time 1400   Class Stop Time 1450   Class Time Calculation (min) 50 min    Row Name 08/15/22 1500     Education   Cardiac Education Topics Pritikin   Licensed conveyancer Nutrition   Nutrition Other  label reading   Instruction Review Code 1- Verbalizes Understanding    Class Start Time 1400   Class Stop Time 1440   Class Time Calculation (min) 40 min    Row Name 08/18/22 1500     Education   Cardiac Education Topics Pritikin   Geographical information systems officer Exercise   Exercise Workshop Exercise Basics: Building Your Action Plan   Instruction Review Code 1- Verbalizes Understanding   Class Start Time 1404   Class Stop Time 1448   Class Time Calculation (min) 44 min    Row Name 08/20/22 1500     Education   Cardiac Education Topics Pritikin   Customer service manager   Weekly Topic Tasty Appetizers and Snacks   Instruction Review Code 1- Verbalizes Understanding   Class Start Time 1400   Class Stop Time 1440   Class Time Calculation (min) 40 min    Row Name 08/22/22 1500     Education   Cardiac Education Topics Pritikin   Nurse, children's Exercise Physiologist   Select Nutrition   Nutrition Nutrition Action Plan   Instruction Review Code 1- Verbalizes Understanding   Class Start Time 1400   Class Stop Time 1440   Class Time Calculation (min) 40 min    Row Name 08/25/22 1600     Education   Cardiac Education Topics Pritikin   Licensed conveyancer Nutrition   Nutrition Calorie Density   Instruction Review Code 1- Verbalizes Understanding   Class Start Time 1400   Class Stop Time 1445   Class Time Calculation (min) 45 min            Core Videos: Exercise    Move It!  Clinical staff conducted group or individual video education with verbal and written material and guidebook.  Patient learns the recommended Pritikin exercise program. Exercise with the goal of living a long, healthy life. Some of the health benefits of exercise include controlled diabetes, healthier blood pressure levels, improved cholesterol levels, improved heart and lung capacity, improved sleep,  and better body composition. Everyone should speak with their doctor before starting or changing an exercise routine.  Biomechanical Limitations Clinical staff conducted group or individual video education with verbal and written material and guidebook.  Patient learns how biomechanical limitations can impact exercise and how we can mitigate and possibly overcome limitations to have an impactful and balanced exercise routine.  Body Composition Clinical staff conducted group or individual video education with verbal and written material and guidebook.  Patient learns that body composition (ratio of muscle mass to fat mass) is a key component to assessing overall fitness, rather than body weight alone. Increased fat mass, especially visceral belly fat, can put [place] at increased risk for metabolic syndrome, type 2 diabetes, heart disease, and even death. It is recommended to combine diet and exercise (cardiovascular and resistance training) to improve your body composition. Seek guidance from your physician and exercise physiologist before implementing an exercise routine.  Exercise Action Plan Clinical staff conducted group or individual video education with verbal and written material and guidebook.  Patient learns the recommended strategies to achieve and enjoy long-term exercise adherence, including variety, self-motivation, self-efficacy, and positive decision making. Benefits of exercise include fitness, good health, weight management, more energy, better sleep, less stress, and overall well-being.  Medical   Heart Disease Risk Reduction Clinical staff conducted group or individual video education with verbal and written material and guidebook.  Patient learns our heart is our most vital organ as it circulates oxygen, nutrients, white blood cells, and hormones throughout the entire body, and carries waste away. Data supports a plant-based eating plan like the Pritikin Program for its effectiveness  in slowing progression of and reversing heart disease. The video provides a number of recommendations to address heart disease.   Metabolic Syndrome and Belly Fat  Clinical staff conducted group or individual video education with verbal and written material and guidebook.  Patient learns what metabolic syndrome is, how it leads to heart disease, and how one can reverse it and keep it from coming back. You have metabolic syndrome if you have 3 of the following 5 criteria: abdominal obesity, high blood pressure, high triglycerides, low HDL cholesterol, and high blood sugar.  Hypertension and Heart Disease Clinical staff conducted group or individual video education with verbal and written material and guidebook.  Patient learns that high blood pressure, or hypertension, is very common in the [place]. Hypertension is largely due to excessive salt intake, but other important risk factors include being overweight, physical inactivity, drinking too much alcohol, smoking, and not eating enough potassium from fruits and vegetables. High blood pressure is a leading risk factor for heart attack, stroke, congestive heart failure, dementia, kidney failure, and premature death. Long-term effects of excessive salt intake include stiffening of the arteries and thickening of heart muscle and organ damage. Recommendations include ways to reduce hypertension and the risk of heart disease.  Diseases of Our Time - Focusing on Diabetes Clinical staff conducted group or individual video education with verbal and written material and guidebook.  Patient learns why the best way to stop diseases of our time is prevention, through food and other lifestyle changes. Medicine (  such as prescription pills and surgeries) is often only a Band-Aid on the problem, not a long-term solution. Most common diseases of our time include obesity, type 2 diabetes, hypertension, heart disease, and cancer. The Pritikin Program is recommended  and has been proven to help reduce, reverse, and/or prevent the damaging effects of metabolic syndrome.  Nutrition   Overview of the Pritikin Eating Plan  Clinical staff conducted group or individual video education with verbal and written material and guidebook.  Patient learns about the Pritikin Eating Plan for disease risk reduction. The Pritikin Eating Plan emphasizes a wide variety of unrefined, minimally-processed carbohydrates, like fruits, vegetables, whole grains, and legumes. Go, Caution, and Stop food choices are explained. Plant-based and lean animal proteins are emphasized. Rationale provided for low sodium intake for blood pressure control, low added sugars for blood sugar stabilization, and low added fats and oils for coronary artery disease risk reduction and weight management.  Calorie Density  Clinical staff conducted group or individual video education with verbal and written material and guidebook.  Patient learns about calorie density and how it impacts the Pritikin Eating Plan. Knowing the characteristics of the food you choose will help you decide whether those foods will lead to weight gain or weight loss, and whether you want to consume more or less of them. Weight loss is usually a side effect of the Pritikin Eating Plan because of its focus on low calorie-dense foods.  Label Reading  Clinical staff conducted group or individual video education with verbal and written material and guidebook.  Patient learns about the Pritikin recommended label reading guidelines and corresponding recommendations regarding calorie density, added sugars, sodium content, and whole grains.  Dining Out - Part 1  Clinical staff conducted group or individual video education with verbal and written material and guidebook.  Patient learns that restaurant meals can be sabotaging because they can be so high in calories, fat, sodium, and/or sugar. Patient learns recommended strategies on how to  positively address this and avoid unhealthy pitfalls.  Facts on Fats  Clinical staff conducted group or individual video education with verbal and written material and guidebook.  Patient learns that lifestyle modifications can be just as effective, if not more so, as many medications for lowering your risk of heart disease. A Pritikin lifestyle can help to reduce your risk of inflammation and atherosclerosis (cholesterol build-up, or plaque, in the artery walls). Lifestyle interventions such as dietary choices and physical activity address the cause of atherosclerosis. A review of the types of fats and their impact on blood cholesterol levels, along with dietary recommendations to reduce fat intake is also included.  Nutrition Action Plan  Clinical staff conducted group or individual video education with verbal and written material and guidebook.  Patient learns how to incorporate Pritikin recommendations into their lifestyle. Recommendations include planning and keeping personal health goals in mind as an important part of their success.  Healthy Mind-Set    Healthy Minds, Bodies, Hearts  Clinical staff conducted group or individual video education with verbal and written material and guidebook.  Patient learns how to identify when they are stressed. Video will discuss the impact of that stress, as well as the many benefits of stress management. Patient will also be introduced to stress management techniques. The way we think, act, and feel has an impact on our hearts.  How Our Thoughts Can Heal Our Hearts  Clinical staff conducted group or individual video education with verbal and written material and guidebook.  Patient learns that negative thoughts can cause depression and anxiety. This can result in negative lifestyle behavior and serious health problems. Cognitive behavioral therapy is an effective method to help control our thoughts in order to change and improve our emotional  outlook.  Additional Videos:  Exercise    Improving Performance  Clinical staff conducted group or individual video education with verbal and written material and guidebook.  Patient learns to use a non-linear approach by alternating intensity levels and lengths of time spent exercising to help burn more calories and lose more body fat. Cardiovascular exercise helps improve heart health, metabolism, hormonal balance, blood sugar control, and recovery from fatigue. Resistance training improves strength, endurance, balance, coordination, reaction time, metabolism, and muscle mass. Flexibility exercise improves circulation, posture, and balance. Seek guidance from your physician and exercise physiologist before implementing an exercise routine and learn your capabilities and proper form for all exercise.  Introduction to Yoga  Clinical staff conducted group or individual video education with verbal and written material and guidebook.  Patient learns about yoga, a discipline of the coming together of mind, breath, and body. The benefits of yoga include improved flexibility, improved range of motion, better posture and core strength, increased lung function, weight loss, and positive self-image. Yoga's heart health benefits include lowered blood pressure, healthier heart rate, decreased cholesterol and triglyceride levels, improved immune function, and reduced stress. Seek guidance from your physician and exercise physiologist before implementing an exercise routine and learn your capabilities and proper form for all exercise.  Medical   Aging: Enhancing Your Quality of Life  Clinical staff conducted group or individual video education with verbal and written material and guidebook.  Patient learns key strategies and recommendations to stay in good physical health and enhance quality of life, such as prevention strategies, having an advocate, securing a Health Care Proxy and Power of Attorney, and keeping  a list of medications and system for tracking them. It also discusses how to avoid risk for bone loss.  Biology of Weight Control  Clinical staff conducted group or individual video education with verbal and written material and guidebook.  Patient learns that weight gain occurs because we consume more calories than we burn (eating more, moving less). Even if your body weight is normal, you may have higher ratios of fat compared to muscle mass. Too much body fat puts you at increased risk for cardiovascular disease, heart attack, stroke, type 2 diabetes, and obesity-related cancers. In addition to exercise, following the Pritikin Eating Plan can help reduce your risk.  Decoding Lab Results  Clinical staff conducted group or individual video education with verbal and written material and guidebook.  Patient learns that lab test reflects one measurement whose values change over time and are influenced by many factors, including medication, stress, sleep, exercise, food, hydration, pre-existing medical conditions, and more. It is recommended to use the knowledge from this video to become more involved with your lab results and evaluate your numbers to speak with your doctor.   Diseases of Our Time - Overview  Clinical staff conducted group or individual video education with verbal and written material and guidebook.  Patient learns that according to the CDC, 50% to 70% of chronic diseases (such as obesity, type 2 diabetes, elevated lipids, hypertension, and heart disease) are avoidable through lifestyle improvements including healthier food choices, listening to satiety cues, and increased physical activity.  Sleep Disorders Clinical staff conducted group or individual video education with verbal and written material and guidebook.  Patient learns how good quality and duration of sleep are important to overall health and well-being. Patient also learns about sleep disorders and how they impact health  along with recommendations to address them, including discussing with a physician.  Nutrition  Dining Out - Part 2 Clinical staff conducted group or individual video education with verbal and written material and guidebook.  Patient learns how to plan ahead and communicate in order to maximize their dining experience in a healthy and nutritious manner. Included are recommended food choices based on the type of restaurant the patient is visiting.   Fueling a Banker conducted group or individual video education with verbal and written material and guidebook.  There is a strong connection between our food choices and our health. Diseases like obesity and type 2 diabetes are very prevalent and are in large-part due to lifestyle choices. The Pritikin Eating Plan provides plenty of food and hunger-curbing satisfaction. It is easy to follow, affordable, and helps reduce health risks.  Menu Workshop  Clinical staff conducted group or individual video education with verbal and written material and guidebook.  Patient learns that restaurant meals can sabotage health goals because they are often packed with calories, fat, sodium, and sugar. Recommendations include strategies to plan ahead and to communicate with the manager, chef, or server to help order a healthier meal.  Planning Your Eating Strategy  Clinical staff conducted group or individual video education with verbal and written material and guidebook.  Patient learns about the Pritikin Eating Plan and its benefit of reducing the risk of disease. The Pritikin Eating Plan does not focus on calories. Instead, it emphasizes high-quality, nutrient-rich foods. By knowing the characteristics of the foods, we choose, we can determine their calorie density and make informed decisions.  Targeting Your Nutrition Priorities  Clinical staff conducted group or individual video education with verbal and written material and guidebook.   Patient learns that lifestyle habits have a tremendous impact on disease risk and progression. This video provides eating and physical activity recommendations based on your personal health goals, such as reducing LDL cholesterol, losing weight, preventing or controlling type 2 diabetes, and reducing high blood pressure.  Vitamins and Minerals  Clinical staff conducted group or individual video education with verbal and written material and guidebook.  Patient learns different ways to obtain key vitamins and minerals, including through a recommended healthy diet. It is important to discuss all supplements you take with your doctor.   Healthy Mind-Set    Smoking Cessation  Clinical staff conducted group or individual video education with verbal and written material and guidebook.  Patient learns that cigarette smoking and tobacco addiction pose a serious health risk which affects millions of people. Stopping smoking will significantly reduce the risk of heart disease, lung disease, and many forms of cancer. Recommended strategies for quitting are covered, including working with your doctor to develop a successful plan.  Culinary   Becoming a Set designer conducted group or individual video education with verbal and written material and guidebook.  Patient learns that cooking at home can be healthy, cost-effective, quick, and puts them in control. Keys to cooking healthy recipes will include looking at your recipe, assessing your equipment needs, planning ahead, making it simple, choosing cost-effective seasonal ingredients, and limiting the use of added fats, salts, and sugars.  Cooking - Breakfast and Snacks  Clinical staff conducted group or individual video education with verbal and written material and guidebook.  Patient learns how important breakfast is to satiety and nutrition through the entire day. Recommendations include key foods to eat during breakfast to help  stabilize blood sugar levels and to prevent overeating at meals later in the day. Planning ahead is also a key component.  Cooking - Educational psychologist conducted group or individual video education with verbal and written material and guidebook.  Patient learns eating strategies to improve overall health, including an approach to cook more at home. Recommendations include thinking of animal protein as a side on your plate rather than center stage and focusing instead on lower calorie dense options like vegetables, fruits, whole grains, and plant-based proteins, such as beans. Making sauces in large quantities to freeze for later and leaving the skin on your vegetables are also recommended to maximize your experience.  Cooking - Healthy Salads and Dressing Clinical staff conducted group or individual video education with verbal and written material and guidebook.  Patient learns that vegetables, fruits, whole grains, and legumes are the foundations of the Pritikin Eating Plan. Recommendations include how to incorporate each of these in flavorful and healthy salads, and how to create homemade salad dressings. Proper handling of ingredients is also covered. Cooking - Soups and [employer] - Soups and Desserts Clinical staff conducted group or individual video education with verbal and written material and guidebook.  Patient learns that Pritikin soups and desserts make for easy, nutritious, and delicious snacks and meal components that are low in sodium, fat, sugar, and calorie density, while high in vitamins, minerals, and filling fiber. Recommendations include simple and healthy ideas for soups and desserts.   Overview     The Pritikin Solution Program Overview Clinical staff conducted group or individual video education with verbal and written material and guidebook.  Patient learns that the results of the Pritikin Program have been documented in more than 100 articles published  in peer-reviewed journals, and the benefits include reducing risk factors for (and, in some cases, even reversing) high cholesterol, high blood pressure, type 2 diabetes, obesity, and more! An overview of the three key pillars of the Pritikin Program will be covered: eating well, doing regular exercise, and having a healthy mind-set.  WORKSHOPS  Exercise: Exercise Basics: Building Your Action Plan Clinical staff led group instruction and group discussion with PowerPoint presentation and patient guidebook. To enhance the learning environment the use of posters, models and videos may be added. At the conclusion of this workshop, patients will comprehend the difference between physical activity and exercise, as well as the benefits of incorporating both, into their routine. Patients will understand the FITT (Frequency, Intensity, Time, and Type) principle and how to use it to build an exercise action plan. In addition, safety concerns and other considerations for exercise and cardiac rehab will be addressed by the presenter. The purpose of this lesson is to promote a comprehensive and effective weekly exercise routine in order to improve patients' overall level of fitness.   Managing Heart Disease: Your Path to a Healthier Heart Clinical staff led group instruction and group discussion with PowerPoint presentation and patient guidebook. To enhance the learning environment the use of posters, models and videos may be added.At the conclusion of this workshop, patients will understand the anatomy and physiology of the heart. Additionally, they will understand how Pritikin's three pillars impact the risk factors, the progression, and the management of heart disease.  The purpose of this lesson is to provide a high-level overview of the  heart, heart disease, and how the Pritikin lifestyle positively impacts risk factors.  Exercise Biomechanics Clinical staff led group instruction and group discussion  with PowerPoint presentation and patient guidebook. To enhance the learning environment the use of posters, models and videos may be added. Patients will learn how the structural parts of their bodies function and how these functions impact their daily activities, movement, and exercise. Patients will learn how to promote a neutral spine, learn how to manage pain, and identify ways to improve their physical movement in order to promote healthy living. The purpose of this lesson is to expose patients to common physical limitations that impact physical activity. Participants will learn practical ways to adapt and manage aches and pains, and to minimize their effect on regular exercise. Patients will learn how to maintain good posture while sitting, walking, and lifting.  Balance Training and Fall Prevention  Clinical staff led group instruction and group discussion with PowerPoint presentation and patient guidebook. To enhance the learning environment the use of posters, models and videos may be added. At the conclusion of this workshop, patients will understand the importance of their sensorimotor skills (vision, proprioception, and the vestibular system) in maintaining their ability to balance as they age. Patients will apply a variety of balancing exercises that are appropriate for their current level of function. Patients will understand the common causes for poor balance, possible solutions to these problems, and ways to modify their physical environment in order to minimize their fall risk. The purpose of this lesson is to teach patients about the importance of maintaining balance as they age and ways to minimize their risk of falling.  WORKSHOPS   Nutrition:  Fueling a Ship broker led group instruction and group discussion with PowerPoint presentation and patient guidebook. To enhance the learning environment the use of posters, models and videos may be added. Patients will  review the foundational principles of the Pritikin Eating Plan and understand what constitutes a serving size in each of the food groups. Patients will also learn Pritikin-friendly foods that are better choices when away from home and review make-ahead meal and snack options. Calorie density will be reviewed and applied to three nutrition priorities: weight maintenance, weight loss, and weight gain. The purpose of this lesson is to reinforce (in a group setting) the key concepts around what patients are recommended to eat and how to apply these guidelines when away from home by planning and selecting Pritikin-friendly options. Patients will understand how calorie density may be adjusted for different weight management goals.  Mindful Eating  Clinical staff led group instruction and group discussion with PowerPoint presentation and patient guidebook. To enhance the learning environment the use of posters, models and videos may be added. Patients will briefly review the concepts of the Pritikin Eating Plan and the importance of low-calorie dense foods. The concept of mindful eating will be introduced as well as the importance of paying attention to internal hunger signals. Triggers for non-hunger eating and techniques for dealing with triggers will be explored. The purpose of this lesson is to provide patients with the opportunity to review the basic principles of the Pritikin Eating Plan, discuss the value of eating mindfully and how to measure internal cues of hunger and fullness using the Hunger Scale. Patients will also discuss reasons for non-hunger eating and learn strategies to use for controlling emotional eating.  Targeting Your Nutrition Priorities Clinical staff led group instruction and group discussion with PowerPoint presentation and patient guidebook. To  enhance the learning environment the use of posters, models and videos may be added. Patients will learn how to determine their genetic  susceptibility to disease by reviewing their family history. Patients will gain insight into the importance of diet as part of an overall healthy lifestyle in mitigating the impact of genetics and other environmental insults. The purpose of this lesson is to provide patients with the opportunity to assess their personal nutrition priorities by looking at their family history, their own health history and current risk factors. Patients will also be able to discuss ways of prioritizing and modifying the Pritikin Eating Plan for their highest risk areas  Menu  Clinical staff led group instruction and group discussion with PowerPoint presentation and patient guidebook. To enhance the learning environment the use of posters, models and videos may be added. Using menus brought in from [employer], or printed from [employer], patients will apply the Pritikin dining out guidelines that were presented in the [employer] video. Patients will also be able to practice these guidelines in a variety of provided scenarios. The purpose of this lesson is to provide patients with the opportunity to practice hands-on learning of the Pritikin Dining Out guidelines with actual menus and practice scenarios.  Label Reading Clinical staff led group instruction and group discussion with PowerPoint presentation and patient guidebook. To enhance the learning environment the use of posters, models and videos may be added. Patients will review and discuss the Pritikin label reading guidelines presented in Pritikin's Label Reading Educational series video. Using fool labels brought in from local grocery stores and markets, patients will apply the label reading guidelines and determine if the packaged food meet the Pritikin guidelines. The purpose of this lesson is to provide patients with the opportunity to review, discuss, and practice hands-on learning of the Pritikin Label Reading guidelines with actual  packaged food labels. Cooking School  Pritikin's [employer] are designed to teach patients ways to prepare quick, simple, and affordable recipes at home. The importance of nutrition's role in chronic disease risk reduction is reflected in its emphasis in the overall Pritikin program. By learning how to prepare essential core Pritikin Eating Plan recipes, patients will increase control over what they eat; be able to customize the flavor of foods without the use of added salt, sugar, or fat; and improve the quality of the food they consume. By learning a set of core recipes which are easily assembled, quickly prepared, and affordable, patients are more likely to prepare more healthy foods at home. These workshops focus on convenient breakfasts, simple entres, side dishes, and desserts which can be prepared with minimal effort and are consistent with nutrition recommendations for cardiovascular risk reduction. Cooking [employer] are taught by a Armed forces logistics/support/administrative officer (RD) who has been trained by the [employer]. The chef or RD has a clear understanding of the importance of minimizing - if not completely eliminating - added fat, sugar, and sodium in recipes. Throughout the series of Cooking School Workshop sessions, patients will learn about healthy ingredients and efficient methods of cooking to build confidence in their capability to prepare    Cooking School weekly topics:  Adding Flavor- Sodium-Free  Fast and Healthy Breakfasts  Powerhouse Plant-Based Proteins  Satisfying Salads and Dressings  Simple Sides and Sauces  International Cuisine-Spotlight on the [employer] Zones  Delicious Desserts  Savory Soups  [employer] - Meals in a Snap  Tasty Appetizers and Snacks  Comforting Weekend  Breakfasts  One-Pot Wonders   Fast Evening Meals  Landscape architect Your Pritikin Plate  WORKSHOPS   Healthy Mindset (Psychosocial):  Focused Goals,  Sustainable Changes Clinical staff led group instruction and group discussion with PowerPoint presentation and patient guidebook. To enhance the learning environment the use of posters, models and videos may be added. Patients will be able to apply effective goal setting strategies to establish at least one personal goal, and then take consistent, meaningful action toward that goal. They will learn to identify common barriers to achieving personal goals and develop strategies to overcome them. Patients will also gain an understanding of how our mind-set can impact our ability to achieve goals and the importance of cultivating a positive and growth-oriented mind-set. The purpose of this lesson is to provide patients with a deeper understanding of how to set and achieve personal goals, as well as the tools and strategies needed to overcome common obstacles which may arise along the way.  From Head to Heart: The Power of a Healthy Outlook  Clinical staff led group instruction and group discussion with PowerPoint presentation and patient guidebook. To enhance the learning environment the use of posters, models and videos may be added. Patients will be able to recognize and describe the impact of emotions and mood on physical health. They will discover the importance of self-care and explore self-care practices which may work for them. Patients will also learn how to utilize the 4 C's to cultivate a healthier outlook and better manage stress and challenges. The purpose of this lesson is to demonstrate to patients how a healthy outlook is an essential part of maintaining good health, especially as they continue their cardiac rehab journey.  Healthy Sleep for a Healthy Heart Clinical staff led group instruction and group discussion with PowerPoint presentation and patient guidebook. To enhance the learning environment the use of posters, models and videos may be added. At the conclusion of this workshop, patients  will be able to demonstrate knowledge of the importance of sleep to overall health, well-being, and quality of life. They will understand the symptoms of, and treatments for, common sleep disorders. Patients will also be able to identify daytime and nighttime behaviors which impact sleep, and they will be able to apply these tools to help manage sleep-related challenges. The purpose of this lesson is to provide patients with a general overview of sleep and outline the importance of quality sleep. Patients will learn about a few of the most common sleep disorders. Patients will also be introduced to the concept of "sleep hygiene," and discover ways to self-manage certain sleeping problems through simple daily behavior changes. Finally, the workshop will motivate patients by clarifying the links between quality sleep and their goals of heart-healthy living.   Recognizing and Reducing Stress Clinical staff led group instruction and group discussion with PowerPoint presentation and patient guidebook. To enhance the learning environment the use of posters, models and videos may be added. At the conclusion of this workshop, patients will be able to understand the types of stress reactions, differentiate between acute and chronic stress, and recognize the impact that chronic stress has on their health. They will also be able to apply different coping mechanisms, such as reframing negative self-talk. Patients will have the opportunity to practice a variety of stress management techniques, such as deep abdominal breathing, progressive muscle relaxation, and/or guided imagery.  The purpose of this lesson is to educate patients on the role of stress in their lives and  to provide healthy techniques for coping with it.  Learning Barriers/Preferences:  Learning Barriers/Preferences - 06/26/22 1314       Learning Barriers/Preferences   Learning Barriers Sight    Learning Preferences Skilled Demonstration;Verbal  Instruction;Written Material;Audio;Computer/Internet;Group Instruction;Individual Instruction;Pictoral;Video             Education Topics:  Knowledge Questionnaire Score:  Knowledge Questionnaire Score - 06/26/22 1330       Knowledge Questionnaire Score   Pre Score 19/24             Core Components/Risk Factors/Patient Goals at Admission:  Personal Goals and Risk Factors at Admission - 06/26/22 1315       Core Components/Risk Factors/Patient Goals on Admission   Hypertension Yes    Intervention Monitor prescription use compliance.;Provide education on lifestyle modifcations including regular physical activity/exercise, weight management, moderate sodium restriction and increased consumption of fresh fruit, vegetables, and low fat dairy, alcohol moderation, and smoking cessation.    Expected Outcomes Short Term: Continued assessment and intervention until BP is < 140/66mm HG in hypertensive participants. < 130/41mm HG in hypertensive participants with diabetes, heart failure or chronic kidney disease.;Long Term: Maintenance of blood pressure at goal levels.    Lipids Yes    Intervention Provide education and support for participant on nutrition & aerobic/resistive exercise along with prescribed medications to achieve LDL 70mg , HDL >40mg .    Expected Outcomes Short Term: Participant states understanding of desired cholesterol values and is compliant with medications prescribed. Participant is following exercise prescription and nutrition guidelines.;Long Term: Cholesterol controlled with medications as prescribed, with individualized exercise RX and with personalized nutrition plan. Value goals: LDL < 70mg , HDL > 40 mg.             Core Components/Risk Factors/Patient Goals Review:   Goals and Risk Factor Review     Row Name 07/29/22 1546             Core Components/Risk Factors/Patient Goals Review   Personal Goals Review Weight  Management/Obesity;Hypertension;Lipids       Review [place] is doing well with exercise at intensive cardiac rehab. Vital signs have been stable       Expected Outcomes Rena will continue to participate in intensive cardiac rehab for exercise, nutrition and lifestyle modifciations                Core Components/Risk Factors/Patient Goals at Discharge (Final Review):   Goals and Risk Factor Review - 07/29/22 1546       Core Components/Risk Factors/Patient Goals Review   Personal Goals Review Weight Management/Obesity;Hypertension;Lipids    Review Geraldo is doing well with exercise at intensive cardiac rehab. Vital signs have been stable    Expected Outcomes [place] will continue to participate in intensive cardiac rehab for exercise, nutrition and lifestyle modifciations             ITP Comments:  ITP Comments     Row Name 06/26/22 1100 07/29/22 1545 08/27/22 1108       ITP Comments Dr. Armanda Magic medical director. Introduction to pritikin education program/intensive cardiac rehab. Initial orientation packet reviewed with patient. 30 Day ITP Review. [place] has good attendance and participation in intensive cardiac rehab 30 Day ITP Review. [place] has good attendance and participation in intensive cardiac rehab. [place] will complete intensive cardiac rehab on 09/12/22              Comments: See ITP comments.Thayer Headings RN BSN

## 2022-08-29 ENCOUNTER — Encounter (HOSPITAL_COMMUNITY)
Admission: RE | Admit: 2022-08-29 | Discharge: 2022-08-29 | Disposition: A | Discharge status: Home / Self Care | Payer: 59 | Source: Ambulatory Visit | Attending: Cardiovascular Disease | Admitting: Cardiovascular Disease

## 2022-08-29 DIAGNOSIS — Z951 Presence of aortocoronary bypass graft: Secondary | ICD-10-CM

## 2022-08-29 DIAGNOSIS — I214 Non-ST elevation (NSTEMI) myocardial infarction: Secondary | ICD-10-CM

## 2022-09-01 ENCOUNTER — Encounter (HOSPITAL_COMMUNITY)
Admission: RE | Admit: 2022-09-01 | Discharge: 2022-09-01 | Disposition: A | Discharge status: Home / Self Care | Payer: 59 | Source: Ambulatory Visit | Attending: Cardiovascular Disease | Admitting: Cardiovascular Disease

## 2022-09-01 DIAGNOSIS — I214 Non-ST elevation (NSTEMI) myocardial infarction: Secondary | ICD-10-CM

## 2022-09-01 DIAGNOSIS — Z951 Presence of aortocoronary bypass graft: Secondary | ICD-10-CM

## 2022-09-02 NOTE — Progress Notes (Signed)
Reviewed home exercise Rx with patient today.  Encouraged warm-up, cool-down, and stretching. Reviewed THRR of 58 - 115 and keeping RPE between 11-13. Encouraged to hydrate with activity.  Reviewed weather parameters for temperature and humidity for safe exercise outdoors. Reviewed S/S to terminate exercise and when to call 911 vs MD. Pt encouraged to always carry a cell phone for safety when exercising outdoors. Pt verbalized understanding of the home exercise Rx and was provided a copy.   Oralia Criger MS, ACSM-CEP, CCRP  

## 2022-09-03 ENCOUNTER — Encounter (HOSPITAL_COMMUNITY)
Admission: RE | Admit: 2022-09-03 | Discharge: 2022-09-03 | Disposition: A | Discharge status: Home / Self Care | Payer: 59 | Source: Ambulatory Visit | Attending: Cardiovascular Disease | Admitting: Cardiovascular Disease

## 2022-09-03 DIAGNOSIS — I214 Non-ST elevation (NSTEMI) myocardial infarction: Secondary | ICD-10-CM

## 2022-09-03 DIAGNOSIS — Z951 Presence of aortocoronary bypass graft: Secondary | ICD-10-CM

## 2022-09-05 ENCOUNTER — Encounter (HOSPITAL_COMMUNITY)
Admission: RE | Admit: 2022-09-05 | Discharge: 2022-09-05 | Disposition: A | Discharge status: Home / Self Care | Payer: 59 | Source: Ambulatory Visit | Attending: Cardiovascular Disease | Admitting: Cardiovascular Disease

## 2022-09-05 DIAGNOSIS — I214 Non-ST elevation (NSTEMI) myocardial infarction: Secondary | ICD-10-CM

## 2022-09-05 DIAGNOSIS — Z951 Presence of aortocoronary bypass graft: Secondary | ICD-10-CM

## 2022-09-08 ENCOUNTER — Encounter (HOSPITAL_COMMUNITY)
Admission: RE | Admit: 2022-09-08 | Discharge: 2022-09-08 | Disposition: A | Discharge status: Home / Self Care | Payer: 59 | Source: Ambulatory Visit | Attending: Cardiovascular Disease | Admitting: Cardiovascular Disease

## 2022-09-08 DIAGNOSIS — Z951 Presence of aortocoronary bypass graft: Secondary | ICD-10-CM

## 2022-09-08 DIAGNOSIS — I214 Non-ST elevation (NSTEMI) myocardial infarction: Secondary | ICD-10-CM

## 2022-09-10 ENCOUNTER — Encounter (HOSPITAL_COMMUNITY)
Admission: RE | Admit: 2022-09-10 | Discharge: 2022-09-10 | Disposition: A | Discharge status: Home / Self Care | Payer: 59 | Source: Ambulatory Visit | Attending: Cardiovascular Disease | Admitting: Cardiovascular Disease

## 2022-09-10 VITALS — Ht 66.0 in | Wt 183.0 lb

## 2022-09-10 DIAGNOSIS — I214 Non-ST elevation (NSTEMI) myocardial infarction: Secondary | ICD-10-CM

## 2022-09-10 DIAGNOSIS — Z951 Presence of aortocoronary bypass graft: Secondary | ICD-10-CM

## 2022-09-12 ENCOUNTER — Telehealth (HOSPITAL_COMMUNITY): Payer: Self-pay | Admitting: *Deleted

## 2022-09-12 ENCOUNTER — Encounter (HOSPITAL_COMMUNITY): Payer: 59

## 2022-09-12 NOTE — Telephone Encounter (Signed)
Spoke with [place] class is cancelled due to [employer] issues. Whit will return to exercise on Monday to complete cardiac rehab.Thayer Headings RN BSN

## 2022-09-15 ENCOUNTER — Encounter (HOSPITAL_COMMUNITY)
Admission: RE | Admit: 2022-09-15 | Discharge: 2022-09-15 | Disposition: A | Discharge status: Home / Self Care | Payer: 59 | Source: Ambulatory Visit | Attending: Cardiovascular Disease | Admitting: Cardiovascular Disease

## 2022-09-15 DIAGNOSIS — Z951 Presence of aortocoronary bypass graft: Secondary | ICD-10-CM

## 2022-09-15 DIAGNOSIS — I214 Non-ST elevation (NSTEMI) myocardial infarction: Secondary | ICD-10-CM | POA: Diagnosis not present

## 2022-09-15 NOTE — Progress Notes (Signed)
Discharge Progress Report  Patient Details  Name: [name] MRN: [id number] Date of Birth: 1945/12/09 Referring Provider:   Flowsheet Row INTENSIVE CARDIAC REHAB ORIENT from 06/26/2022 in [facility]  Referring Provider Charlton Haws, MD        Number of Visits: 83  Reason for Discharge:  Patient reached a stable level of exercise. Patient independent in their exercise. Patient has met program and personal goals.  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  ADL [facility]:   Initial Exercise Prescription:  Initial Exercise Prescription - 06/26/22 1300       Date of Initial Exercise RX and Referring Provider   Date 06/26/22    Referring Provider Charlton Haws, MD    Expected Discharge Date 09/05/22      NuStep   Level 1    SPM 75    Minutes 15    METs 1.9      Track   Laps 8    Minutes 15    METs 1.9      Prescription Details   Frequency (times per week) 3x    Duration Progress to 30 minutes of continuous aerobic without signs/symptoms of physical distress      Intensity   THRR 40-80% of Max Heartrate 58-115    Ratings of Perceived Exertion 11-13    Perceived Dyspnea 0-4      Progression   Progression Continue progressive overload as per policy without signs/symptoms or physical distress.      Resistance Training   Training Prescription Yes    Weight 2 lbs    Reps 10-15             Discharge Exercise Prescription (Final Exercise Prescription Changes):  Exercise Prescription Changes - 09/15/22 1400       Response to Exercise   Blood Pressure (Admit) 112/70    Blood  Pressure (Exercise) 122/78    Blood Pressure (Exit) 118/70    Heart Rate (Admit) 72 bpm    Heart Rate (Exercise) 98 bpm    Heart Rate (Exit) 69 bpm    Rating of Perceived Exertion (Exercise) 11    Symptoms None    Comments Pt graduated from the CRP2 program    Duration Continue with 30 min of aerobic exercise without signs/symptoms of physical distress.    Intensity THRR unchanged      Progression   Progression Continue to progress workloads to maintain intensity without signs/symptoms of physical distress.    Average METs 3.4      Resistance Training   Training Prescription Yes    Weight 4 lbs    Reps 10-15    Time 10 Minutes      Interval Training   Interval Training No      NuStep   Level 4    SPM 121    Minutes 15    METs 3.6      Track   Laps 17    Minutes 15    METs 3.17      Home Exercise Plan   Plans to continue exercise at Home (comment)  Frequency Add 4 additional days to program exercise sessions.    Initial Home Exercises Provided 08/29/22             Functional Capacity:  6 Minute Walk     Row Name 06/26/22 1213 09/05/22 1447       6 Minute Walk   Phase Initial Discharge    Distance 1038 feet 1820 feet    Distance % Change -- 75.34 %    Distance Feet Change -- 782 ft    Walk Time 6 minutes 6 minutes    # of Rest Breaks 0 0    MPH 2 3.45    METS 1.93 3.7    RPE 11 10    Perceived Dyspnea  1 0    VO2 Peak 6.8 12.85    Symptoms Yes (comment) Yes (comment)    Comments Rt lower leg surgical pain 3/10, SOB RPD = 1 3/10 sternal incision pain    Resting HR 72 bpm 62 bpm    Resting BP 112/70 110/58    Resting Oxygen Saturation  98 % --    Exercise Oxygen Saturation  during 6 min walk 98 % --    Max Ex. HR 98 bpm 111 bpm    Max Ex. BP 122/78 164/78    2 Minute Post BP 118/70 --             6 Minute Walk     Row Name 06/26/22 1213 09/05/22 1447       6 Minute Walk   Phase Initial Discharge    Distance 1038 feet 1820 feet     Distance % Change -- 75.34 %    Distance Feet Change -- 782 ft    Walk Time 6 minutes 6 minutes    # of Rest Breaks 0 0    MPH 2 3.45    METS 1.93 3.7    RPE 11 10    Perceived Dyspnea  1 0    VO2 Peak 6.8 12.85    Symptoms Yes (comment) Yes (comment)    Comments Rt lower leg surgical pain 3/10, SOB RPD = 1 3/10 sternal incision pain    Resting HR 72 bpm 62 bpm    Resting BP 112/70 110/58    Resting Oxygen Saturation  98 % --    Exercise Oxygen Saturation  during 6 min walk 98 % --    Max Ex. HR 98 bpm 111 bpm    Max Ex. BP 122/78 164/78    2 Minute Post BP 118/70 --             Psychological, QOL, Others - Outcomes: PHQ 2/9:    09/15/2022    1:50 PM 06/26/2022   12:48 PM 04/02/2018    4:16 PM  Depression screen PHQ 2/9  Decreased Interest 0 0 0  Down, Depressed, Hopeless 0 0 0  PHQ - 2 Score 0 0 0  Altered sleeping 0 2   Tired, decreased energy 0 2   Change in appetite 0 0   Feeling bad or failure about yourself  0 0   Trouble concentrating 0 0   Moving slowly or fidgety/restless 0 1   Suicidal thoughts 0 0   PHQ-9 Score 0 5   Difficult doing work/chores Not difficult at all Somewhat difficult     Quality of Life:  Quality of Life - 09/04/22 1544       Quality of Life Scores   Health/Function Pre 24.3 %  Health/Function Post 22 %    Health/Function % Change -9.47 %    Socioeconomic Pre 29.14 %    Socioeconomic Post 24.56 %    Socioeconomic % Change  -15.72 %    Psych/Spiritual Pre 27.43 %    Psych/Spiritual Post 22.29 %    Psych/Spiritual % Change -18.74 %    Family Pre 28.8 %    Family Post 30 %    Family % Change 4.17 %    GLOBAL Pre 26.6 %    GLOBAL Post 23.6 %    GLOBAL % Change -11.28 %             Personal Goals: Goals established at orientation with interventions provided to work toward goal.  Personal Goals and Risk Factors at Admission - 06/26/22 1315       Core Components/Risk Factors/Patient Goals on Admission   Hypertension Yes     Intervention Monitor prescription use compliance.;Provide education on lifestyle modifcations including regular physical activity/exercise, weight management, moderate sodium restriction and increased consumption of fresh fruit, vegetables, and low fat dairy, alcohol moderation, and smoking cessation.    Expected Outcomes Short Term: Continued assessment and intervention until BP is < 140/64mm HG in hypertensive participants. < 130/52mm HG in hypertensive participants with diabetes, heart failure or chronic kidney disease.;Long Term: Maintenance of blood pressure at goal levels.    Lipids Yes    Intervention Provide education and support for participant on nutrition & aerobic/resistive exercise along with prescribed medications to achieve LDL 70mg , HDL >40mg .    Expected Outcomes Short Term: Participant states understanding of desired cholesterol values and is compliant with medications prescribed. Participant is following exercise prescription and nutrition guidelines.;Long Term: Cholesterol controlled with medications as prescribed, with individualized exercise RX and with personalized nutrition plan. Value goals: LDL < 70mg , HDL > 40 mg.              Personal Goals Discharge:  Goals and Risk Factor Review     Row Name 07/29/22 1546             Core Components/Risk Factors/Patient Goals Review   Personal Goals Review Weight Management/Obesity;Hypertension;Lipids       Review Aneesh is doing well with exercise at intensive cardiac rehab. Vital signs have been stable       Expected Outcomes Tomio will continue to participate in intensive cardiac rehab for exercise, nutrition and lifestyle modifciations                Goals and Risk Factor Review     Row Name 07/29/22 1546             Core Components/Risk Factors/Patient Goals Review   Personal Goals Review Weight Management/Obesity;Hypertension;Lipids       Review Deno is doing well with exercise at intensive cardiac  rehab. Vital signs have been stable       Expected Outcomes Ifeanyi will continue to participate in intensive cardiac rehab for exercise, nutrition and lifestyle modifciations                Exercise Goals and Review:  Exercise Goals     Row Name 06/26/22 1253             Exercise Goals   Increase Physical Activity Yes       Intervention Provide advice, education, support and counseling about physical activity/exercise needs.;Develop an individualized exercise prescription for aerobic and resistive training based on initial evaluation findings, risk stratification, comorbidities and participant's personal  goals.       Expected Outcomes Short Term: Attend rehab on a regular basis to increase amount of physical activity.;Long Term: Add in home exercise to make exercise part of routine and to increase amount of physical activity.;Long Term: Exercising regularly at least 3-5 days a week.       Increase Strength and Stamina Yes       Intervention Develop an individualized exercise prescription for aerobic and resistive training based on initial evaluation findings, risk stratification, comorbidities and participant's personal goals.;Provide advice, education, support and counseling about physical activity/exercise needs.       Expected Outcomes Short Term: Increase workloads from initial exercise prescription for resistance, speed, and METs.;Short Term: Perform resistance training exercises routinely during rehab and add in resistance training at home;Long Term: Improve cardiorespiratory fitness, muscular endurance and strength as measured by increased METs and functional capacity ( )       Able to understand and use rate of perceived exertion (RPE) scale Yes       Intervention Provide education and explanation on how to use RPE scale       Expected Outcomes Short Term: Able to use RPE daily in rehab to express subjective intensity level;Long Term:  Able to use RPE to guide intensity level  when exercising independently       Knowledge and understanding of Target Heart Rate Range (THRR) Yes       Intervention Provide education and explanation of THRR including how the numbers were predicted and where they are located for reference       Expected Outcomes Short Term: Able to state/look up THRR;Long Term: Able to use THRR to govern intensity when exercising independently;Short Term: Able to use daily as guideline for intensity in rehab       Understanding of Exercise Prescription Yes       Intervention Provide education, explanation, and written materials on patient's individual exercise prescription       Expected Outcomes Short Term: Able to explain program exercise prescription;Long Term: Able to explain home exercise prescription to exercise independently                Exercise Goals     Row Name 06/26/22 1253             Exercise Goals   Increase Physical Activity Yes       Intervention Provide advice, education, support and counseling about physical activity/exercise needs.;Develop an individualized exercise prescription for aerobic and resistive training based on initial evaluation findings, risk stratification, comorbidities and participant's personal goals.       Expected Outcomes Short Term: Attend rehab on a regular basis to increase amount of physical activity.;Long Term: Add in home exercise to make exercise part of routine and to increase amount of physical activity.;Long Term: Exercising regularly at least 3-5 days a week.       Increase Strength and Stamina Yes       Intervention Develop an individualized exercise prescription for aerobic and resistive training based on initial evaluation findings, risk stratification, comorbidities and participant's personal goals.;Provide advice, education, support and counseling about physical activity/exercise needs.       Expected Outcomes Short Term: Increase workloads from initial exercise prescription for resistance,  speed, and METs.;Short Term: Perform resistance training exercises routinely during rehab and add in resistance training at home;Long Term: Improve cardiorespiratory fitness, muscular endurance and strength as measured by increased METs and functional capacity ( )       Able to  understand and use rate of perceived exertion (RPE) scale Yes       Intervention Provide education and explanation on how to use RPE scale       Expected Outcomes Short Term: Able to use RPE daily in rehab to express subjective intensity level;Long Term:  Able to use RPE to guide intensity level when exercising independently       Knowledge and understanding of Target Heart Rate Range (THRR) Yes       Intervention Provide education and explanation of THRR including how the numbers were predicted and where they are located for reference       Expected Outcomes Short Term: Able to state/look up THRR;Long Term: Able to use THRR to govern intensity when exercising independently;Short Term: Able to use daily as guideline for intensity in rehab       Understanding of Exercise Prescription Yes       Intervention Provide education, explanation, and written materials on patient's individual exercise prescription       Expected Outcomes Short Term: Able to explain program exercise prescription;Long Term: Able to explain home exercise prescription to exercise independently                Exercise Goals Re-Evaluation:  Exercise Goals Re-Evaluation     Row Name 07/02/22 1524 07/25/22 1441 08/18/22 1500 09/15/22 1406       Exercise Goal Re-Evaluation   Exercise Goals Review Increase Physical Activity;Increase Strength and Stamina;Able to understand and use rate of perceived exertion (RPE) scale;Knowledge and understanding of Target Heart Rate Range (THRR);Understanding of Exercise Prescription Increase Physical Activity;Increase Strength and Stamina;Able to understand and use rate of perceived exertion (RPE) scale;Knowledge and  understanding of Target Heart Rate Range (THRR);Understanding of Exercise Prescription Increase Physical Activity;Increase Strength and Stamina;Able to understand and use rate of perceived exertion (RPE) scale;Knowledge and understanding of Target Heart Rate Range (THRR);Understanding of Exercise Prescription Increase Physical Activity;Increase Strength and Stamina;Able to understand and use rate of perceived exertion (RPE) scale;Knowledge and understanding of Target Heart Rate Range (THRR);Understanding of Exercise Prescription    Comments Pt's first day in the CRP2 program. Pt understands the RPE scale, Exercise Rx, and THRR. Reviewed METs and goals. Pt voices he is making progress on his goals of increased strength and stamina. Reviewed METs and goals. Pt voices he continues to progress on his goal of increased strength and stamina. Pt graduated rom the CRP2 program today. Pt had peak METs of  4.2.  Pt plans to continue exercise at home by walking and using the floor mounted cycle ergometer. Pt has been exercising on his off days for up to 60 minutes.    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 Will continue to montior patients and progress workloads as tolerated. Will continue to montior patients and progress workloads as tolerated. Pt will continue to exercise at home.             Exercise Goals Re-Evaluation     Row Name 07/02/22 1524 07/25/22 1441 08/18/22 1500 09/15/22 1406       Exercise Goal Re-Evaluation   Exercise Goals Review Increase Physical Activity;Increase Strength and Stamina;Able to understand and use rate of perceived exertion (RPE) scale;Knowledge and understanding of Target Heart Rate Range (THRR);Understanding of Exercise Prescription Increase Physical Activity;Increase Strength and Stamina;Able to understand and use rate of perceived exertion (RPE) scale;Knowledge and understanding of Target Heart Rate Range (THRR);Understanding  of Exercise Prescription Increase Physical Activity;Increase Strength and  Stamina;Able to understand and use rate of perceived exertion (RPE) scale;Knowledge and understanding of Target Heart Rate Range (THRR);Understanding of Exercise Prescription Increase Physical Activity;Increase Strength and Stamina;Able to understand and use rate of perceived exertion (RPE) scale;Knowledge and understanding of Target Heart Rate Range (THRR);Understanding of Exercise Prescription    Comments Pt's first day in the CRP2 program. Pt understands the RPE scale, Exercise Rx, and THRR. Reviewed METs and goals. Pt voices he is making progress on his goals of increased strength and stamina. Reviewed METs and goals. Pt voices he continues to progress on his goal of increased strength and stamina. Pt graduated rom the CRP2 program today. Pt had peak METs of  4.2.  Pt plans to continue exercise at home by walking and using the floor mounted cycle ergometer. Pt has been exercising on his off days for up to 60 minutes.    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 Will continue to montior patients and progress workloads as tolerated. Will continue to montior patients and progress workloads as tolerated. Pt will continue to exercise at home.             Nutrition & Weight - Outcomes:  Pre Biometrics - 06/26/22 1237       Pre Biometrics   Waist Circumference 42 inches    Hip Circumference 39 inches    Waist to Hip Ratio 1.08 %    Triceps Skinfold 15 mm    Grip Strength 38 kg    Flexibility 0 in   unable to reach   Single Leg Stand 15 seconds             Post Biometrics - 09/10/22 1429        Post  Biometrics   Height 5\' 6"  (1.676 m)    Weight 83 kg    Waist Circumference 41 inches    Hip Circumference 39.5 inches    Waist to Hip Ratio 1.04 %    BMI (Calculated) 29.55    Triceps Skinfold 13 mm    % Body Fat 28.7 %    Grip Strength 39 kg    Flexibility 10.25  in    Single Leg Stand 28 seconds             Nutrition:  Nutrition Therapy & Goals - 08/27/22 1132       Nutrition Therapy   Diet Heart Healthy/Carbohydrate Consistent Diet      Personal Nutrition Goals   Comments Goals in action. Patient continues to attend the [facility] education and nutrition series regularly. Patient has started making many dietary changes including increased fish and vegetables. He is up 3.5# since starting with our program. Patient will benefit from participation in intensive cardiac rehab for nutrition, exercise, and lifestyle modification.      Intervention Plan   Intervention Prescribe, educate and counsel regarding individualized specific dietary modifications aiming towards targeted core components such as weight, hypertension, lipid management, diabetes, heart failure and other comorbidities.;Nutrition handout(s) given to patient.    Expected Outcomes Short Term Goal: Understand basic principles of dietary content, such as calories, fat, sodium, cholesterol and nutrients.;Long Term Goal: Adherence to prescribed nutrition plan.             Nutrition Discharge:  Nutrition Assessments - 09/10/22 1139       Rate Your Plate Scores   Pre Score 68    Post Score 67  Education Questionnaire Score:  Knowledge Questionnaire Score - 09/04/22 1544       Knowledge Questionnaire Score   Post Score 22/24             Goals reviewed with patient; copy given to patient.Pt graduates from  Intensive/Traditional cardiac rehab program 09/15/22 with completion of  30 exercise and  29 education sessions. Pt maintained good attendance and progressed nicely during their participation in rehab as evidenced by increased MET level. Sarthak increased his distance on his post exercise walk test by 782 feet.   Medication list reconciled. Repeat  PHQ score-0  .  Pt has made significant lifestyle changes and should be commended for his  success. Kaion   achieved their goals during cardiac rehab.   Pt plans to continue exercise at home by walking. Using his foot exerciser and using hand weights at home. We are proud of Jalene's progress!  Thayer Headings RN BSN

## 2022-09-16 ENCOUNTER — Other Ambulatory Visit: Payer: Self-pay | Admitting: Cardiovascular Disease

## 2022-09-16 ENCOUNTER — Other Ambulatory Visit: Payer: Self-pay | Admitting: Surgical

## 2022-09-25 NOTE — Progress Notes (Signed)
Office Visit    Patient Name: [name] Date of Encounter: 09/25/2022  Primary Care Provider:  Alysia Penna, MD Primary Cardiologist:  [name] Haws, MD Primary Electrophysiologist: None   Past Medical History    Past Medical History:  Diagnosis Date   Anxiety    Arthritis    "wrists" (07/27/2014)   Coronary artery disease    a.  Pt with 4 stents over the course of 2003-2012;  b.  LHC 6/16:  LAD, RCA, LCx stents patent, OM1 50%, EF 50-55% >> med rx    Elevated LFTs    GERD (gastroesophageal reflux disease)    Hypercholesterolemia    Hypertension    Ischemic cardiomyopathy    cMRI 03/2011:  EF 48% with dist ant, septal, apical and inf-apical dyskinesia, no apical clot, dist ant, apical and septal full thickness scar >> No ICD needed  //  Limited echo 7/19: Large apical septal aneurysm, severe hypokinesis of the entire apex and mid to apical segments of the anterior wall, anterolateral wall and anterior septum-consistent with infarct and most of the LAD territory; EF 35    Left ventricular aneurysm    Limited echo 7/19: Large apical septal aneurysm, severe hypokinesis of the entire apex and mid to apical segments of the anterior wall, anterolateral wall and anterior septum-consistent with infarct and most of the LAD territory; EF 35   Myocardial infarction (HCC) 2003   Past Surgical History:  Procedure Laterality Date   CARDIAC CATHETERIZATION  06/11/2012   Nonobstructive CAD, patent stents, EF 50%, mild apical dyskinesis   CARDIAC CATHETERIZATION N/A 07/28/2014   Procedure: Left Heart Cath and Coronary Angiography;  Surgeon: [name] Bihari, MD;  Location: [facility] INVASIVE CV LAB;  Service: Cardiovascular;  Laterality: N/A;   CORONARY ANGIOPLASTY WITH STENT PLACEMENT  2003-2012    total of 4 stents place   CORONARY ARTERY BYPASS GRAFT N/A 05/08/2022   Procedure: CORONARY ARTERY BYPASS GRAFTING (CABG) x4, USING LEFT INTERNAL MAMMARY ARTERY AND ENDOSCOPICALLY HARVESTED RIGHT GREATER  SAPHENOUS VEIN;  Surgeon: [name] Hoes, MD;  Location: [facility] OR;  Service: Open Heart Surgery;  Laterality: N/A;   CORONARY STENT INTERVENTION N/A 10/20/2017   Procedure: CORONARY STENT INTERVENTION;  Surgeon: [name] Bollman, MD;  Location: [facility] INVASIVE CV LAB;  Service: Cardiovascular;  Laterality: N/A;   COSMETIC SURGERY Left 1970's   "dog ripped of my ear"   LEFT HEART CATH AND CORONARY ANGIOGRAPHY N/A 10/20/2017   Procedure: LEFT HEART CATH AND CORONARY ANGIOGRAPHY;  Surgeon: [name] Bollman, MD;  Location: [facility] INVASIVE CV LAB;  Service: Cardiovascular;  Laterality: N/A;   LEFT HEART CATH AND CORONARY ANGIOGRAPHY N/A 05/02/2022   Procedure: LEFT HEART CATH AND CORONARY ANGIOGRAPHY;  Surgeon: [name] Lex, MD;  Location: [facility] INVASIVE CV LAB;  Service: Cardiovascular;  Laterality: N/A;   LEFT HEART CATHETERIZATION WITH CORONARY ANGIOGRAM N/A 06/11/2012   Procedure: LEFT HEART CATHETERIZATION WITH CORONARY ANGIOGRAM;  Surgeon: [name] M [place], MD;  Location: [facility] CATH LAB;  Service: Cardiovascular;  Laterality: N/A;   TEE WITHOUT CARDIOVERSION N/A 05/08/2022   Procedure: TRANSESOPHAGEAL ECHOCARDIOGRAM;  Surgeon: [name] Hoes, MD;  Location: [facility] OR;  Service: Open Heart Surgery;  Laterality: N/A;    Allergies  Allergies  Allergen Reactions   Asa [Aspirin] Other (See Comments)    Abdominal bleeding   Penicillins Anaphylaxis    Has patient had a PCN reaction causing immediate rash, facial/tongue/throat swelling, SOB or lightheadedness with hypotension: Yes Has patient had a PCN reaction causing severe  rash involving mucus membranes or skin necrosis: No Has patient had a PCN reaction that required hospitalization: No Has patient had a PCN reaction occurring within the last 10 years: No If all of the above answers are "NO", then may proceed with Cephalosporin use.   Statins Other (See Comments)    Stiff joints, muscle tightness, couldn't walk     History of Present Illness    [name] is a  77 y.o. male with PMH of CAD with several PCI's from anterior MI with in-stent restenosis 09/2017, ICM, HLD, arthritis, anxiety, CABG x 4, GI bleed s/p clipping for duodenal erosion who presents today for 25-month follow-up.   [name] has an extensive cardiac history with multiple PCI's due to recurrent MIs.  He presented to the ED by EMS after experiencing left-sided chest pressure that was 6 out of 10.  Pain resolved with nitro and arrival and EKG showed lateral infarct concerning for NSTEMI.  Patient was admitted and troponins continue to increase [id number].  Patient underwent LHC that revealed poor targets for PCI and recommendation for CABG x 4  that was completed on 05/08/2022. LIMA to the LAD and RSVG from the aorta to the PDA and from the aorta to the OM1 sequenced to OM2.  He was seen in follow-up 06/12/2022 reported feeling some improvement and last sternal incisional pain.  He was cleared to begin cardiac rehab but is currently not on Entresto due to hypotension. He was intolerant to statins and declined PCSK9 inhibitors but did follow-up with clinical pharmacy on 06/30/2022 and is planning to give them a try.  Since last being seen in the office patient reports that he has been doing well with no new cardiac complaints since previous visit.  He does endorse some tenderness at sternal incision but denies any cardiac discomfort.  He has recently completed cardiac rehab and did extremely well.  His blood pressure today is controlled at 136/72.  He reports compliance with his current medications and denies any adverse reactions.  He continues to exercise daily at home using his elliptical.  He is currently in the process of obtaining prior authorization to begin Repatha.  Patient denies chest pain, palpitations, dyspnea, PND, orthopnea, nausea, vomiting, dizziness, syncope, edema, weight gain, or early satiety.   Home Medications    Current Outpatient Medications  Medication Sig Dispense Refill    acetaminophen (TYLENOL) 500 MG tablet Take 1,000 mg by mouth as needed for moderate pain.     Alpha-D-Galactosidase (BEANO PO) 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).     Bacillus Coagulans-Inulin (ALIGN PREBIOTIC-PROBIOTIC PO) Take by mouth daily at 6 (six) AM.     carvedilol (COREG) 3.125 MG tablet TAKE 1 TABLET BY MOUTH TWICE A DAY WITH FOOD 180 tablet 1   clopidogrel (PLAVIX) 75 MG tablet Take 1 tablet (75 mg total) by mouth daily. 30 tablet 3   ezetimibe (ZETIA) 10 MG tablet TAKE 1 TABLET BY MOUTH EVERY DAY 90 tablet 3   Fe Fum-Vit C-Vit B12-FA (TRIGELS-F FORTE) CAPS capsule Take 1 capsule by mouth daily after breakfast. 30 capsule 2   LORazepam (ATIVAN) 0.5 MG tablet Take 0.5 mg by mouth as needed for anxiety.     omega-3 acid ethyl esters (LOVAZA) 1 G capsule Take 1 g by mouth 2 (two) times daily.     OVER THE COUNTER MEDICATION Take 1 tablet by mouth in the morning and at bedtime. Beet Root chew     pantoprazole (PROTONIX) 40 MG tablet  Take 1 tablet (40 mg total) by mouth daily. 30 tablet 11   polyethylene glycol (MIRALAX) 17 g packet Take 17 g by mouth daily.     zolpidem (AMBIEN) 10 MG tablet Take 1 tablet (10 mg total) by mouth at bedtime as needed. For sleep (Patient taking differently: Take 5-10 mg by mouth at bedtime as needed for sleep.) 30 tablet 5   No current facility-administered medications for this visit.     Review of Systems  Please see the history of present illness.    (+) Incision no chest pain  All other systems reviewed and are otherwise negative except as noted above.  Physical Exam    Wt Readings from Last 3 Encounters:  09/10/22 182 lb 15.7 oz (83 kg)  06/26/22 179 lb 3.7 oz (81.3 kg)  06/12/22 176 lb (79.8 kg)   ZO:XWRUE were no vitals filed for this visit.,There is no height or weight on file to calculate BMI.  Constitutional:      Appearance: Healthy appearance. Not in distress.  Neck:     Vascular: JVD normal.  Pulmonary:     Effort: Pulmonary  effort is normal.     Breath sounds: No wheezing. No rales. Diminished in the bases Cardiovascular:     Normal rate. Regular rhythm. Normal S1. Normal S2.      Murmurs: There is no murmur.  Edema:    Peripheral edema absent.  Abdominal:     Palpations: Abdomen is soft non tender. There is no hepatomegaly.  Skin:    General: Skin is warm and dry.  Neurological:     General: No focal deficit present.     Mental Status: Alert and oriented to person, place and time.     Cranial Nerves: Cranial nerves are intact.  EKG/LABS/ Recent Cardiac Studies    ECG personally reviewed by me today -none completed today  Lab Results  Component Value Date   WBC 5.7 05/11/2022   HGB 8.8 (L) 05/11/2022   HCT 26.4 (L) 05/11/2022   MCV 94.6 05/11/2022   PLT 178 05/11/2022   Lab Results  Component Value Date   CREATININE 1.22 05/11/2022   BUN 28 (H) 05/11/2022   NA 133 (L) 05/11/2022   K 4.3 05/11/2022   CL 101 05/11/2022   CO2 26 05/11/2022   Lab Results  Component Value Date   ALT 33 06/30/2022   AST 22 06/30/2022   ALKPHOS 81 06/30/2022   BILITOT <0.2 06/30/2022   Lab Results  Component Value Date   CHOL 210 (H) 06/16/2019   HDL 38 (L) 06/16/2019   LDLCALC 112 (H) 06/16/2019   TRIG 350 (H) 06/16/2019   CHOLHDL 5.5 (H) 06/16/2019    Lab Results  Component Value Date   HGBA1C 7.0 (H) 05/06/2022     Assessment & Plan    1.CAD/CABG x 4: -Patient had complaint of chest pain 1 week prior to admission found to have three-vessel CAD with no targets for PCI.  He underwent CABG x 4 that was successful. -Today patient is doing well with slight incisional discomfort but no cardiac pain. -Continue GDMT with Coreg 3.125 mg twice daily, Plavix 75 mg daily, Zetia 10 mg daily, Lovaza 1 g  2.Chronic combined CHF: -Patient's last 2D echo was 50% on 04/2022 -Today patient is euvolemic on examination with no complaints of shortness of breath or lower extremity edema. -Low sodium diet, fluid  restriction <2L, and daily weights encouraged. Educated to contact our office for weight  gain of 2 lbs overnight or 5 lbs in one week.   3.HLD: -Patient's last LDL cholesterol was 115 -We will check LFTs and lipids -Continue Zetia 10 mg and Lovaza 1 g -Patient is currently in the process of obtaining prior authorization to begin Repatha  4.DM type II: -Patient's last hemoglobin A1c was 7.0 -Continue lifestyle modifications  5.  CKD stage IIIa: -Patient's last creatinine was 1.2  Disposition: Follow-up with [name] Haws, MD or APP in 6 months    Medication Adjustments/Labs and Tests Ordered: Current medicines are reviewed at length with the patient today.  Concerns regarding medicines are outlined above.   Signed, Napoleon Form, Leodis Rains, NP 09/25/2022, 12:24 PM [facility]

## 2022-09-26 ENCOUNTER — Ambulatory Visit: Payer: 59 | Attending: Nurse Practitioner | Admitting: Nurse Practitioner

## 2022-09-26 ENCOUNTER — Encounter: Payer: Self-pay | Admitting: Nurse Practitioner

## 2022-09-26 VITALS — BP 136/72 | HR 67 | Ht 66.0 in | Wt 183.2 lb

## 2022-09-26 DIAGNOSIS — I2510 Atherosclerotic heart disease of native coronary artery without angina pectoris: Secondary | ICD-10-CM

## 2022-09-26 DIAGNOSIS — E785 Hyperlipidemia, unspecified: Secondary | ICD-10-CM

## 2022-09-26 DIAGNOSIS — Z9861 Coronary angioplasty status: Secondary | ICD-10-CM

## 2022-09-26 DIAGNOSIS — E119 Type 2 diabetes mellitus without complications: Secondary | ICD-10-CM

## 2022-09-26 DIAGNOSIS — I5022 Chronic systolic (congestive) heart failure: Secondary | ICD-10-CM

## 2022-09-26 DIAGNOSIS — N1831 Chronic kidney disease, stage 3a: Secondary | ICD-10-CM | POA: Diagnosis not present

## 2022-09-26 MED ORDER — EZETIMIBE 10 MG PO TABS: 10.0000 mg | ORAL_TABLET | Freq: Every day | ORAL | 3 refills | Status: DC

## 2022-09-26 NOTE — Patient Instructions (Addendum)
Medication Instructions:  Your physician recommends that you continue on your current medications as directed. Please refer to the Current Medication list given to you today. *If you need a refill on your cardiac medications before your next appointment, please call your pharmacy*   Lab Work: NEXT WEEK-LFTs & LIPIDs If you have labs (blood work) drawn today and your tests are completely normal, you will receive your results only by: MyChart Message (if you have MyChart) OR A paper copy in the mail If you have any lab test that is abnormal or we need to change your treatment, we will call you to review the results.   Testing/Procedures: NONE ORDERED   Follow-Up: At [facility], you and your health needs are our priority.  As part of our continuing mission to provide you with exceptional heart care, we have created designated Provider Care Teams.  These Care Teams include your primary Cardiologist (physician) and Advanced Practice Providers (APPs -  Physician Assistants and Nurse Practitioners) who all work together to provide you with the care you need, when you need it.  We recommend signing up for the patient portal called "MyChart".  Sign up information is provided on this After Visit Summary.  MyChart is used to connect with patients for Virtual Visits (Telemedicine).  Patients are able to view lab/test results, encounter notes, upcoming appointments, etc.  Non-urgent messages can be sent to your provider as well.   To learn more about what you can do with MyChart, go to [email or web address].    Your next appointment:   6 month(s)  Provider:   Charlton Haws, MD     Other Instructions

## 2022-10-01 ENCOUNTER — Ambulatory Visit: Payer: 59 | Attending: Cardiovascular Disease

## 2022-10-01 DIAGNOSIS — I2510 Atherosclerotic heart disease of native coronary artery without angina pectoris: Secondary | ICD-10-CM

## 2022-10-01 DIAGNOSIS — N1831 Chronic kidney disease, stage 3a: Secondary | ICD-10-CM

## 2022-10-01 DIAGNOSIS — E785 Hyperlipidemia, unspecified: Secondary | ICD-10-CM

## 2022-10-01 DIAGNOSIS — I5022 Chronic systolic (congestive) heart failure: Secondary | ICD-10-CM

## 2022-10-01 DIAGNOSIS — E119 Type 2 diabetes mellitus without complications: Secondary | ICD-10-CM

## 2022-10-09 ENCOUNTER — Other Ambulatory Visit: Payer: Self-pay

## 2022-10-09 DIAGNOSIS — E782 Mixed hyperlipidemia: Secondary | ICD-10-CM

## 2022-10-13 ENCOUNTER — Other Ambulatory Visit: Payer: Self-pay | Admitting: Surgical

## 2022-11-05 LAB — HEMOGLOBIN A1C: A1c: 6.3

## 2022-11-05 LAB — COMPREHENSIVE METABOLIC PANEL: EGFR: 64.9

## 2023-01-02 ENCOUNTER — Telehealth: Payer: Self-pay | Admitting: Cardiovascular Disease

## 2023-01-02 NOTE — Telephone Encounter (Signed)
Attempted phone call to pt.  OK per Epic to leave detailed voicemail message.  Pt advised per NP lab appointment canceled.  Please call [phone] for any further questions or concerns.

## 2023-01-02 NOTE — Telephone Encounter (Signed)
Patient is requesting call back to see if he needs to keep appt for labs on 11/15 since he had labs done at his PCP office 09/11. Requesting call back to discuss.

## 2023-01-09 ENCOUNTER — Ambulatory Visit: Payer: 59

## 2023-03-18 NOTE — Progress Notes (Signed)
Cardiology Office Note    Date:  03/27/2023   ID:  [name], DOB 09-04-1945, MRN [id number]  PCP:  Alysia Penna, MD  Cardiologist: Charlton Haws, MD   History of Present Illness:   78 y.o. f/u for advanced CAD and ischemic DCM.  Anterior MI with DES to LAD and circumflex in 2012 in  [place]. 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 Unstable angina August 2019 cath with Dr Excell Seltzer. 10/20/17 DES to mid LAD for instent  Restenosis. Other arteries ok except small OM1 not suitable for intervention.   Had repeat cath 05/03/22 with significant restenosis of 3 stents and then had CABG with Dr Leafy Ro 05/08/22 with LIMA to LAD, SVG to PDA, and sequential graft to OM1/OM2   TTE with EF 35% F/U MRI 12/25/17 to risk stratify For AICD EF 46% full thickness scar involving distal septum, anterior wall apex and inferior apex.   History of GI bleed March 2019 with duodenal erosions that were clipped. Intolerant to ASA. Also history of CKD, HLD, HTN  No angina compliant with meds no recurrent GI bleeding No signs of volume overload or CHF Functional class one Uses elliptical trainer at home.   Referred to lipid clinic Getting authorization for Repatha LDL with primary 77 and triglycerides 157  Seems that he tabled the PSK9 usage Long talk about trying it given his failed stents and recent CABG with target LDL 55 or less  Using floor pedal machine to exercise Has lost some weight and feels good    Past Medical History:  Diagnosis Date   Anxiety    Arthritis    "wrists" (07/27/2014)   Coronary artery disease    a.  Pt with 4 stents over the course of 2003-2012;  b.  LHC 6/16:  LAD, RCA, LCx stents patent, OM1 50%, EF 50-55% >> med rx    Elevated LFTs    GERD (gastroesophageal reflux disease)    Hypercholesterolemia    Hypertension    Ischemic cardiomyopathy    cMRI 03/2011:  EF 48% with dist ant, septal, apical and inf-apical dyskinesia, no apical clot, dist ant, apical and septal full  thickness scar >> No ICD needed  //  Limited echo 7/19: Large apical septal aneurysm, severe hypokinesis of the entire apex and mid to apical segments of the anterior wall, anterolateral wall and anterior septum-consistent with infarct and most of the LAD territory; EF 35    Left ventricular aneurysm    Limited echo 7/19: Large apical septal aneurysm, severe hypokinesis of the entire apex and mid to apical segments of the anterior wall, anterolateral wall and anterior septum-consistent with infarct and most of the LAD territory; EF 35   Myocardial infarction (HCC) 2003    Past Surgical History:  Procedure Laterality Date   CARDIAC CATHETERIZATION  06/11/2012   Nonobstructive CAD, patent stents, EF 50%, mild apical dyskinesis   CARDIAC CATHETERIZATION N/A 07/28/2014   Procedure: Left Heart Cath and Coronary Angiography;  Surgeon: Lennette Bihari, MD;  Location: [facility] INVASIVE CV LAB;  Service: Cardiovascular;  Laterality: N/A;   CORONARY ANGIOPLASTY WITH STENT PLACEMENT  2003-2012    total of 4 stents place   CORONARY ARTERY BYPASS GRAFT N/A 05/08/2022   Procedure: CORONARY ARTERY BYPASS GRAFTING (CABG) x4, USING LEFT INTERNAL MAMMARY ARTERY AND ENDOSCOPICALLY HARVESTED RIGHT GREATER SAPHENOUS VEIN;  Surgeon: Eugenio Hoes, MD;  Location: [facility] OR;  Service: Open Heart Surgery;  Laterality: N/A;   CORONARY STENT INTERVENTION N/A 10/20/2017  Procedure: CORONARY STENT INTERVENTION;  Surgeon: Tonny Bollman, MD;  Location: [facility] INVASIVE CV LAB;  Service: Cardiovascular;  Laterality: N/A;   COSMETIC SURGERY Left 1970's   "dog ripped of my ear"   LEFT HEART CATH AND CORONARY ANGIOGRAPHY N/A 10/20/2017   Procedure: LEFT HEART CATH AND CORONARY ANGIOGRAPHY;  Surgeon: Tonny Bollman, MD;  Location: [facility];  Service: Cardiovascular;  Laterality: N/A;   LEFT HEART CATH AND CORONARY ANGIOGRAPHY N/A 05/02/2022   Procedure: LEFT HEART CATH AND CORONARY ANGIOGRAPHY;  Surgeon: Marykay Lex, MD;  Location:  [facility] INVASIVE CV LAB;  Service: Cardiovascular;  Laterality: N/A;   LEFT HEART CATHETERIZATION WITH CORONARY ANGIOGRAM N/A 06/11/2012   Procedure: LEFT HEART CATHETERIZATION WITH CORONARY ANGIOGRAM;  Surgeon: Anasophia Pecor M [place], MD;  Location: [facility] CATH LAB;  Service: Cardiovascular;  Laterality: N/A;   TEE WITHOUT CARDIOVERSION N/A 05/08/2022   Procedure: TRANSESOPHAGEAL ECHOCARDIOGRAM;  Surgeon: Eugenio Hoes, MD;  Location: [facility] OR;  Service: Open Heart Surgery;  Laterality: N/A;    Current Medications: Current Meds  Medication Sig   acetaminophen (TYLENOL) 500 MG tablet Take 1,000 mg by mouth as needed for moderate pain.   Alpha-D-Galactosidase (BEANO PO) 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).   Bacillus Coagulans-Inulin (ALIGN PREBIOTIC-PROBIOTIC PO) Take by mouth daily at 6 (six) AM.   carvedilol (COREG) 3.125 MG tablet TAKE 1 TABLET BY MOUTH TWICE A DAY WITH FOOD   clopidogrel (PLAVIX) 75 MG tablet Take 1 tablet (75 mg total) by mouth daily.   ezetimibe (ZETIA) 10 MG tablet Take 1 tablet (10 mg total) by mouth daily.   Fe Fum-Vit C-Vit B12-FA (TRIGELS-F FORTE) CAPS capsule Take 1 capsule by mouth daily after breakfast.   LORazepam (ATIVAN) 0.5 MG tablet Take 0.5 mg by mouth as needed for anxiety.   omega-3 acid ethyl esters (LOVAZA) 1 G capsule Take 1 g by mouth 2 (two) times daily.   OVER THE COUNTER MEDICATION Take 1 tablet by mouth in the morning and at bedtime. Beet Root chew   pantoprazole (PROTONIX) 40 MG tablet Take 1 tablet (40 mg total) by mouth daily.   polyethylene glycol (MIRALAX) 17 g packet Take 17 g by mouth daily.   zolpidem (AMBIEN) 10 MG tablet Take 1 tablet (10 mg total) by mouth at bedtime as needed. For sleep (Patient taking differently: Take 5-10 mg by mouth at bedtime as needed for sleep.)     Allergies:   Asa [aspirin], Penicillins, and Statins   Social History   Socioeconomic History   Marital status: Divorced    Spouse name: Not on file   Number of children: Not on  file   Years of education: Not on file   Highest education level: Not on file  Occupational History   Occupation: retired  Tobacco Use   Smoking status: Former    Current packs/day: 1.00    Average packs/day: 1 pack/day for 15.0 years (15.0 ttl pk-yrs)    Types: Cigarettes   Smokeless tobacco: Never   Tobacco comments:    "quit smoking cigarettes in 1979"  Vaping Use   Vaping status: Never Used  Substance and Sexual Activity   Alcohol use: Not Currently    Alcohol/week: 2.0 standard drinks of alcohol    Types: 2 Glasses of wine per week   Drug use: No   Sexual activity: Not Currently  Other Topics Concern   Not on file  Social History Narrative   Not on file   Social Drivers of  Health   Financial Resource Strain: Not on file  Food Insecurity: No Food Insecurity (05/05/2022)   Hunger Vital Sign    Worried About Running Out of Food in the Last Year: Never true    Ran Out of Food in the Last Year: Never true  Transportation Needs: No Transportation Needs (05/05/2022)   PRAPARE - Administrator, Civil Service (Medical): No    Lack of Transportation (Non-Medical): No  Physical Activity: Not on file  Stress: Not on file  Social Connections: Not on file     Family History:  The patient's family history includes Coronary artery disease in his father and mother.   ROS:   Please see the history of present illness.    Review of Systems  Constitutional: Negative.  HENT: Negative.    Cardiovascular: Negative.   Respiratory: Negative.    Endocrine: Negative.   Hematologic/Lymphatic: Negative.   Musculoskeletal: Negative.   Gastrointestinal: Negative.   Genitourinary: Negative.   Neurological: Negative.    All other systems reviewed and are negative.   PHYSICAL EXAM:   VS:  BP (!) 140/62   Pulse 74   Ht 5\' 6"  (1.676 m)   Wt 184 lb 7.2 oz (83.7 kg)   SpO2 96%   BMI 29.77 kg/m   Physical Exam  GEN: Well nourished, well developed, in no acute distress   Affect appropriate Healthy:  appears stated age HEENT: normal Neck supple with no adenopathy JVP normal right bruits no thyromegaly Lungs clear with no wheezing and good diaphragmatic motion Heart:  S1/S2 no murmur, no rub, gallop or click PMI enlarged  Abdomen: benighn, BS positve, no tenderness, no AAA no bruit.  No HSM or HJR Distal pulses intact with no bruits No edema Neuro non-focal Skin warm and dry No muscular weakness   Wt Readings from Last 3 Encounters:  03/27/23 184 lb 7.2 oz (83.7 kg)  09/26/22 183 lb 3.2 oz (83.1 kg)  09/10/22 182 lb 15.7 oz (83 kg)      Studies/Labs Reviewed:   EKG:   03/27/2023 SR RBBB no acute changes  Recent Labs: 05/09/2022: Magnesium 2.5 05/11/2022: BUN 28; Creatinine, Ser 1.22; Hemoglobin 8.8; Platelets 178; Potassium 4.3; Sodium 133 10/01/2022: ALT 25   Lipid Panel    Component Value Date/Time   CHOL 153 10/01/2022 0749   TRIG 211 (H) 10/01/2022 0749   HDL 40 10/01/2022 0749   CHOLHDL 3.8 10/01/2022 0749   CHOLHDL 5.3 06/11/2012 0234   VLDL 73 (H) 06/11/2012 0234   LDLCALC 78 10/01/2022 0749    Additional studies/ records that were reviewed today include:   Cath 05/03/22 POST-OP DIAGNOSES Severe Multi-Vessel CAD:  Prox LAD 75%@ SP1 (just prior to extensive stented segment) Prox-mid LCx ~65% (prox stent edge lesion) with progression of jailed OM2 lesion to 90% (likely Culprit) Patent Prox to mid RCA stents with sequential focal lesions 80%-60-70% lesions & 80%   Known to be moderate Ischemic CM - EG ~ 40% (unable to assess regional wall motion abnormalities due to poor filling. Recommend 2D echo to better assess)      PLAN Unsure what the best option here is.  Clearly all the lesion segments are treatable with PCI with exception of potentially the OM branch which is jailed.  Complete revascularization with CABG is also an option.  At this point, the best plan is to temporize medically and discussed with interventional  colleagues as well as CVTS consultation (will need to be called in  AM by rounding MD). Treat with a short course of Aggrastat (6 hours) for potential thrombotic disease in OM2. RCA and LAD given his ongoing pain. Restart heparin 6 to 8 hours after sheath removal which would be after Aggrastat completion. Hold clopidogrel Check 2D echo to better assess EF Continue IV nitroglycerin infusion and will hold Entresto for now defer to rounding team based on blood pressure to determine whether will be restarted.  We will continue carvedilol.  Echo 05/03/22  IMPRESSIONS     1. 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 There is no visualized  apical thrombus. . Left ventricular ejection fraction, by estimation, is  50%. The left ventricle has normal function. Left ventricular endocardial  border not optimally defined to  evaluate regional wall motion. Left ventricular diastolic parameters are  consistent with Grade I diastolic dysfunction (impaired relaxation).   2. Right ventricular systolic function is normal. The right ventricular  size is normal. Tricuspid regurgitation signal is inadequate for assessing  PA pressure.   3. The mitral valve is normal in structure. Trivial mitral valve  regurgitation. Mild to moderate mitral stenosis.   4. The aortic valve has an indeterminant number of cusps. Aortic valve  regurgitation is moderate. No aortic stenosis is present.   5. Aortic dilatation noted. There is mild dilatation of the ascending  aorta, measuring 37 mm.   6. The inferior vena cava is normal in size with <50% respiratory  variability, suggesting right atrial pressure of 8 mmHg.    PLAN:  In order of problems listed above:  CAD /CABG:  05/08/22 after multiple stents with restenosis. Intolerant to ASA, continue plavix, coreg zetia   Ischemic cardiomyopathy ejection fraction 35% on echo 08/2017.  Started on Entresto MRI with EF 46% 12/25/17 TTE 01/20/22 with apical aneurysm and EF 45-50%  Intra-op TEE during CABG 04/2022 EF normal   HTN:  Well controlled.  Continue current medications and low sodium Dash type diet. Tends toward bradycardia so beta blocker not increased    CKD stage III creatinine was 1.1 11/05/22   Hyperlipidemia Start repatha f/u lipid clinic 3 months with repeat labs   History of GI bleed in [place] 04/2017 secondary to large erosion and duodenal bulb that was clipped.F/U Dr Dulce Sellar [facility]   Repatha Lipid / Liver 3 months Lipid clinic then   F/U in a year   Signed, Charlton Haws, MD  03/27/2023 1:55 PM    [facility] [address] [place], [place], [place]  [address] Phone: [phone]; Fax: [phone]

## 2023-03-27 ENCOUNTER — Encounter: Payer: Self-pay | Admitting: Cardiovascular Disease

## 2023-03-27 ENCOUNTER — Other Ambulatory Visit (HOSPITAL_COMMUNITY): Payer: Self-pay

## 2023-03-27 ENCOUNTER — Telehealth: Payer: Self-pay | Admitting: Pharmacy Technician

## 2023-03-27 ENCOUNTER — Ambulatory Visit: Payer: 59 | Attending: Cardiovascular Disease | Admitting: Cardiovascular Disease

## 2023-03-27 VITALS — BP 140/62 | HR 74 | Ht 66.0 in | Wt 184.4 lb

## 2023-03-27 DIAGNOSIS — E785 Hyperlipidemia, unspecified: Secondary | ICD-10-CM

## 2023-03-27 DIAGNOSIS — Z951 Presence of aortocoronary bypass graft: Secondary | ICD-10-CM

## 2023-03-27 MED ORDER — REPATHA SURECLICK 140 MG/ML ~~LOC~~ SOAJ
1.0000 mL | SUBCUTANEOUS | 1 refills | Status: DC
Start: 2023-03-27 — End: 2023-03-30
  Filled 2023-03-27: qty 6, 84d supply, fill #0

## 2023-03-27 NOTE — Telephone Encounter (Signed)
Pharmacy Patient Advocate Encounter  Received notification from [facility] that Prior Authorization for repatha has been APPROVED from 03/27/23 to 09/24/23   PA #/Case ID/Reference #: [id number]

## 2023-03-27 NOTE — Telephone Encounter (Signed)
Patient [facility], is that okay? Or does it need to be [facility]?

## 2023-03-27 NOTE — Telephone Encounter (Signed)
Pharmacy Patient Advocate Encounter   Received notification from Physician's Office that prior authorization for repatha is required/requested.   Insurance verification completed.   The patient is insured through [facility] .   Per test claim: PA required; PA submitted to above mentioned insurance via CoverMyMeds Key/confirmation #/EOC [id number] Status is pending

## 2023-03-27 NOTE — Telephone Encounter (Signed)
-----   Message from Nurse Deon Pilling sent at 03/27/2023  2:16 PM EST ----- Hello Ladies,  Dr. Eden Emms wants patient to start on Repatha. Can you please help?  Thank you, Pam, RN

## 2023-03-27 NOTE — Patient Instructions (Signed)
Medication Instructions:  Your physician has recommended you make the following change in your medication:  1-START Repatha 140 mg/ml subcutaneus every 14 days. *If you need a refill on your cardiac medications before your next appointment, please call your pharmacy*  Lab Work: If you have labs (blood work) drawn today and your tests are completely normal, you will receive your results only by: MyChart Message (if you have MyChart) OR A paper copy in the mail If you have any lab test that is abnormal or we need to change your treatment, we will call you to review the results.  Testing/Procedures: None ordered today.  Follow-Up: At [facility], you and your health needs are our priority.  As part of our continuing mission to provide you with exceptional heart care, we have created designated Provider Care Teams.  These Care Teams include your primary Cardiologist (physician) and Advanced Practice Providers (APPs -  Physician Assistants and Nurse Practitioners) who all work together to provide you with the care you need, when you need it.  We recommend signing up for the patient portal called "MyChart".  Sign up information is provided on this After Visit Summary.  MyChart is used to connect with patients for Virtual Visits (Telemedicine).  Patients are able to view lab/test results, encounter notes, upcoming appointments, etc.  Non-urgent messages can be sent to your provider as well.   To learn more about what you can do with MyChart, go to [email or web address].    Your next appointment:   6 months  Provider:   Charlton Haws, MD     Other Instructions You have been referred to Lipid Clinic in 3 months.

## 2023-03-30 ENCOUNTER — Other Ambulatory Visit (HOSPITAL_COMMUNITY): Payer: Self-pay

## 2023-03-30 MED ORDER — REPATHA SURECLICK 140 MG/ML ~~LOC~~ SOAJ: 1.0000 mL | SUBCUTANEOUS | 1 refills | Status: DC

## 2023-03-30 NOTE — Telephone Encounter (Signed)
 [facility]

## 2023-03-30 NOTE — Addendum Note (Signed)
Addended by: Virl Axe, Eknoor Novack L on: 03/30/2023 01:18 PM   Modules accepted: Orders

## 2023-05-12 ENCOUNTER — Other Ambulatory Visit: Payer: Self-pay | Admitting: Cardiovascular Disease

## 2023-07-02 ENCOUNTER — Ambulatory Visit: Admitting: Pharmacist

## 2023-07-02 ENCOUNTER — Ambulatory Visit: Payer: 59

## 2023-07-02 NOTE — Addendum Note (Signed)
 Addended by: Jodell Weitman D on: 07/02/2023 12:35 PM   Modules accepted: Orders

## 2023-07-07 ENCOUNTER — Telehealth: Payer: Self-pay | Admitting: Cardiovascular Disease

## 2023-07-07 ENCOUNTER — Encounter: Payer: Self-pay | Admitting: Pharmacist

## 2023-07-07 LAB — LIPID PANEL
Chol/HDL Ratio: 1.8 ratio (ref 0.0–5.0)
Cholesterol, Total: 81 mg/dL — ABNORMAL LOW (ref 100–199)
HDL: 46 mg/dL (ref >39)
LDL Chol Calc (NIH): 7 mg/dL (ref 0–99)
Triglycerides: 178 mg/dL — ABNORMAL HIGH (ref 0–149)
VLDL Cholesterol Cal: 28 mg/dL (ref 5–40)

## 2023-07-07 LAB — APOLIPOPROTEIN B: Apolipoprotein B: 26 mg/dL (ref <90)

## 2023-07-07 NOTE — Addendum Note (Signed)
 Addended by: Lew Prout D on: 07/07/2023 06:55 AM   Modules accepted: Orders

## 2023-07-07 NOTE — Telephone Encounter (Signed)
 Labs have been routed to PCP.  Patient aware

## 2023-07-07 NOTE — Telephone Encounter (Signed)
 Pt is requesting that a copy of his lab results be sent to his pcp

## 2023-07-15 ENCOUNTER — Telehealth: Payer: Self-pay

## 2023-07-15 NOTE — Telephone Encounter (Signed)
   Pre-operative Risk Assessment    Patient Name: [name]  DOB: 04/19/1945 MRN: [id number]   Date of last office visit: 03/27/23 Janelle Mediate, MD Date of next office visit: NONE  Request for Surgical Clearance    Procedure:  Dental Extraction - Amount of Teeth to be Pulled:  3 TEETH, SCALING, FILLINGS, CROWNS  Date of Surgery:  Clearance TBD                                Surgeon:  DR Emelie Hanger Surgeon's Group or Practice Name:  [facility] Phone number:  [phone] Fax number:  [phone]   Type of Clearance Requested:   - Medical  - Pharmacy:  Hold Clopidogrel  (Plavix )     Type of Anesthesia:  Local    Additional requests/questions:    Signed, Collin Deal   07/15/2023, 4:21 PM

## 2023-07-15 NOTE — Telephone Encounter (Signed)
 Dr. Stann Earnest  You saw this patient on 03/27/2023. Per protocol we request that you comment on his cardiac risk to proceed with multiple dental extractions requiring holding of Plavix . I read over your note and the cath report from 10/20/2017.  He has two stents to his LAD one fairly large. (Pre-stent angioplasty was performed using a BALLOON EMERGE MR 2.5X15. A drug-eluting stent was successfully placed using a STENT RESOLUTE ONYX 2.5 X 38 mm) and subsequent CABG X 4 in 02/2023.  Okay to hold Plavix  for 5 days for the procedure? Please send your comment to P CV Pre-Op Pool.  Thank you, Friddie Jetty DNP, ANP, AACC.

## 2023-07-16 NOTE — Telephone Encounter (Signed)
   Patient Name: [name]  DOB: 11/21/1945 MRN: [id number]  Primary Cardiologist: Janelle Mediate, MD  Chart reviewed as part of pre-operative protocol coverage. Given past medical history and time since last visit, based on ACC/AHA guidelines, [name] is at acceptable risk for the planned procedure without further cardiovascular testing.   The patient was advised that if he develops new symptoms prior to surgery to contact our office to arrange for a follow-up visit, and he verbalized understanding.  Patient can hold Plavix  5 days prior to upcoming dental procedure and should restart postprocedure when surgically safe and hemostasis is achieved.  I will route this recommendation to the requesting party via Epic fax function and remove from pre-op pool.  Please call with questions.  Francene Ing, Retha Cast, NP 07/16/2023, 8:25 AM

## 2023-08-31 ENCOUNTER — Other Ambulatory Visit: Payer: Self-pay | Admitting: Pharmacist

## 2023-08-31 MED ORDER — REPATHA SURECLICK 140 MG/ML ~~LOC~~ SOAJ: 1.0000 mL | SUBCUTANEOUS | 3 refills | Status: AC

## 2023-11-23 ENCOUNTER — Telehealth: Payer: Self-pay | Admitting: Pharmacy Technician

## 2023-11-23 NOTE — Telephone Encounter (Signed)
   Pharmacy Patient Advocate Encounter   Received notification from CoverMyMeds that prior authorization for repatha  is required/requested.   Insurance verification completed.   The patient is insured through [facility] .   Per test claim: PA required; PA submitted to above mentioned insurance via Latent Key/confirmation #/EOC [id number] Status is pending

## 2023-11-23 NOTE — Telephone Encounter (Signed)
 Pharmacy Patient Advocate Encounter  Received notification from [facility] that Prior Authorization for REPATHA  has been APPROVED from 11/23/23 to 02/24/24   PA #/Case ID/Reference #: [id number]

## 2023-11-24 ENCOUNTER — Other Ambulatory Visit (HOSPITAL_COMMUNITY): Payer: Self-pay

## 2023-11-24 NOTE — Telephone Encounter (Signed)
 Told pharmacy this is approved
